# Patient Record
Sex: Female | Born: 1935 | Race: White | Hispanic: No | Smoking: Never smoker
Health system: Southern US, Community
[De-identification: ages and names within clinical notes are randomized; demographics above are authoritative.]

## PROBLEM LIST (undated history)

## (undated) DIAGNOSIS — K219 Gastro-esophageal reflux disease without esophagitis: Secondary | ICD-10-CM

## (undated) DIAGNOSIS — F419 Anxiety disorder, unspecified: Secondary | ICD-10-CM

## (undated) DIAGNOSIS — R39198 Other difficulties with micturition: Secondary | ICD-10-CM

## (undated) DIAGNOSIS — Z950 Presence of cardiac pacemaker: Secondary | ICD-10-CM

## (undated) DIAGNOSIS — Z8719 Personal history of other diseases of the digestive system: Secondary | ICD-10-CM

## (undated) DIAGNOSIS — I4891 Unspecified atrial fibrillation: Secondary | ICD-10-CM

## (undated) DIAGNOSIS — E0590 Thyrotoxicosis, unspecified without thyrotoxic crisis or storm: Secondary | ICD-10-CM

## (undated) DIAGNOSIS — C50919 Malignant neoplasm of unspecified site of unspecified female breast: Secondary | ICD-10-CM

## (undated) DIAGNOSIS — M1990 Unspecified osteoarthritis, unspecified site: Secondary | ICD-10-CM

## (undated) DIAGNOSIS — C7951 Secondary malignant neoplasm of bone: Principal | ICD-10-CM

## (undated) DIAGNOSIS — I495 Sick sinus syndrome: Secondary | ICD-10-CM

## (undated) DIAGNOSIS — I499 Cardiac arrhythmia, unspecified: Secondary | ICD-10-CM

## (undated) DIAGNOSIS — J449 Chronic obstructive pulmonary disease, unspecified: Secondary | ICD-10-CM

## (undated) DIAGNOSIS — J45909 Unspecified asthma, uncomplicated: Secondary | ICD-10-CM

## (undated) DIAGNOSIS — I10 Essential (primary) hypertension: Secondary | ICD-10-CM

## (undated) HISTORY — DX: Malignant neoplasm of unspecified site of unspecified female breast: C50.919

## (undated) HISTORY — DX: Secondary malignant neoplasm of bone: C79.51

---

## 1986-06-08 HISTORY — PX: ANKLE FRACTURE SURGERY: SHX122

## 1986-06-08 HISTORY — PX: TIBIA FRACTURE SURGERY: SHX806

## 1995-06-09 DIAGNOSIS — C50919 Malignant neoplasm of unspecified site of unspecified female breast: Secondary | ICD-10-CM

## 1995-06-09 HISTORY — DX: Malignant neoplasm of unspecified site of unspecified female breast: C50.919

## 1995-06-09 HISTORY — PX: MASTECTOMY: SHX3

## 1995-06-09 HISTORY — PX: BREAST BIOPSY: SHX20

## 1998-06-05 ENCOUNTER — Other Ambulatory Visit: Admission: RE | Admit: 1998-06-05 | Discharge: 1998-06-05 | Payer: Self-pay | Admitting: *Deleted

## 1998-10-18 ENCOUNTER — Encounter: Payer: Self-pay | Admitting: Emergency Medicine

## 1998-10-18 ENCOUNTER — Emergency Department (HOSPITAL_COMMUNITY): Admission: EM | Admit: 1998-10-18 | Discharge: 1998-10-18 | Payer: Self-pay | Admitting: Emergency Medicine

## 1998-12-09 ENCOUNTER — Other Ambulatory Visit: Admission: RE | Admit: 1998-12-09 | Discharge: 1998-12-09 | Payer: Self-pay | Admitting: Gastroenterology

## 1998-12-09 ENCOUNTER — Encounter (INDEPENDENT_AMBULATORY_CARE_PROVIDER_SITE_OTHER): Payer: Self-pay | Admitting: Specialist

## 1999-02-07 ENCOUNTER — Encounter: Payer: Self-pay | Admitting: Specialist

## 1999-02-07 ENCOUNTER — Ambulatory Visit (HOSPITAL_COMMUNITY): Admission: RE | Admit: 1999-02-07 | Discharge: 1999-02-07 | Payer: Self-pay | Admitting: Specialist

## 1999-06-05 ENCOUNTER — Encounter: Payer: Self-pay | Admitting: *Deleted

## 1999-06-05 ENCOUNTER — Encounter: Admission: RE | Admit: 1999-06-05 | Discharge: 1999-06-05 | Payer: Self-pay | Admitting: *Deleted

## 1999-06-05 ENCOUNTER — Other Ambulatory Visit: Admission: RE | Admit: 1999-06-05 | Discharge: 1999-06-05 | Payer: Self-pay | Admitting: *Deleted

## 1999-11-14 ENCOUNTER — Encounter: Admission: RE | Admit: 1999-11-14 | Discharge: 1999-11-14 | Payer: Self-pay | Admitting: Family Medicine

## 1999-11-14 ENCOUNTER — Encounter: Payer: Self-pay | Admitting: Family Medicine

## 2000-03-24 ENCOUNTER — Encounter: Payer: Self-pay | Admitting: Family Medicine

## 2000-03-24 ENCOUNTER — Encounter: Admission: RE | Admit: 2000-03-24 | Discharge: 2000-03-24 | Payer: Self-pay | Admitting: Family Medicine

## 2000-03-26 ENCOUNTER — Encounter: Admission: RE | Admit: 2000-03-26 | Discharge: 2000-03-26 | Payer: Self-pay | Admitting: Family Medicine

## 2000-03-26 ENCOUNTER — Encounter: Payer: Self-pay | Admitting: Family Medicine

## 2000-04-05 ENCOUNTER — Other Ambulatory Visit: Admission: RE | Admit: 2000-04-05 | Discharge: 2000-04-05 | Payer: Self-pay | Admitting: *Deleted

## 2000-04-22 ENCOUNTER — Encounter: Payer: Self-pay | Admitting: Gastroenterology

## 2000-04-22 ENCOUNTER — Ambulatory Visit (HOSPITAL_COMMUNITY): Admission: RE | Admit: 2000-04-22 | Discharge: 2000-04-22 | Payer: Self-pay | Admitting: Gastroenterology

## 2000-06-07 ENCOUNTER — Encounter: Admission: RE | Admit: 2000-06-07 | Discharge: 2000-06-07 | Payer: Self-pay | Admitting: Family Medicine

## 2000-06-07 ENCOUNTER — Encounter: Payer: Self-pay | Admitting: Family Medicine

## 2000-06-09 ENCOUNTER — Encounter: Payer: Self-pay | Admitting: Gastroenterology

## 2000-06-09 ENCOUNTER — Encounter: Admission: RE | Admit: 2000-06-09 | Discharge: 2000-06-09 | Payer: Self-pay | Admitting: Gastroenterology

## 2000-10-22 ENCOUNTER — Emergency Department (HOSPITAL_COMMUNITY): Admission: EM | Admit: 2000-10-22 | Discharge: 2000-10-22 | Payer: Self-pay | Admitting: Emergency Medicine

## 2001-04-06 ENCOUNTER — Encounter: Payer: Self-pay | Admitting: Family Medicine

## 2001-04-06 ENCOUNTER — Encounter: Admission: RE | Admit: 2001-04-06 | Discharge: 2001-04-06 | Payer: Self-pay | Admitting: Family Medicine

## 2001-05-18 ENCOUNTER — Emergency Department (HOSPITAL_COMMUNITY): Admission: EM | Admit: 2001-05-18 | Discharge: 2001-05-18 | Payer: Self-pay | Admitting: Emergency Medicine

## 2001-05-18 ENCOUNTER — Encounter: Payer: Self-pay | Admitting: Emergency Medicine

## 2001-06-23 ENCOUNTER — Encounter: Payer: Self-pay | Admitting: Family Medicine

## 2001-06-23 ENCOUNTER — Encounter: Admission: RE | Admit: 2001-06-23 | Discharge: 2001-06-23 | Payer: Self-pay | Admitting: Family Medicine

## 2002-02-20 ENCOUNTER — Ambulatory Visit (HOSPITAL_COMMUNITY): Admission: RE | Admit: 2002-02-20 | Discharge: 2002-02-20 | Payer: Self-pay | Admitting: Gastroenterology

## 2002-02-20 ENCOUNTER — Encounter (INDEPENDENT_AMBULATORY_CARE_PROVIDER_SITE_OTHER): Payer: Self-pay | Admitting: *Deleted

## 2002-07-26 ENCOUNTER — Encounter: Admission: RE | Admit: 2002-07-26 | Discharge: 2002-07-26 | Payer: Self-pay | Admitting: Family Medicine

## 2002-07-26 ENCOUNTER — Encounter: Payer: Self-pay | Admitting: Family Medicine

## 2002-10-09 ENCOUNTER — Encounter: Admission: RE | Admit: 2002-10-09 | Discharge: 2002-10-09 | Payer: Self-pay | Admitting: Family Medicine

## 2002-10-09 ENCOUNTER — Encounter: Payer: Self-pay | Admitting: Family Medicine

## 2003-06-26 ENCOUNTER — Encounter (INDEPENDENT_AMBULATORY_CARE_PROVIDER_SITE_OTHER): Payer: Self-pay | Admitting: Specialist

## 2003-06-26 ENCOUNTER — Ambulatory Visit (HOSPITAL_COMMUNITY): Admission: RE | Admit: 2003-06-26 | Discharge: 2003-06-26 | Payer: Self-pay | Admitting: Urology

## 2003-07-09 ENCOUNTER — Ambulatory Visit (HOSPITAL_COMMUNITY): Admission: RE | Admit: 2003-07-09 | Discharge: 2003-07-09 | Payer: Self-pay | Admitting: Hematology & Oncology

## 2003-07-11 ENCOUNTER — Ambulatory Visit (HOSPITAL_COMMUNITY): Admission: RE | Admit: 2003-07-11 | Discharge: 2003-07-11 | Payer: Self-pay | Admitting: Hematology & Oncology

## 2003-08-13 ENCOUNTER — Ambulatory Visit: Admission: RE | Admit: 2003-08-13 | Discharge: 2003-10-26 | Payer: Self-pay | Admitting: Radiation Oncology

## 2003-12-27 ENCOUNTER — Ambulatory Visit (HOSPITAL_COMMUNITY): Admission: RE | Admit: 2003-12-27 | Discharge: 2003-12-27 | Payer: Self-pay | Admitting: Hematology & Oncology

## 2004-02-07 ENCOUNTER — Emergency Department (HOSPITAL_COMMUNITY): Admission: EM | Admit: 2004-02-07 | Discharge: 2004-02-08 | Payer: Self-pay | Admitting: Emergency Medicine

## 2004-03-11 ENCOUNTER — Ambulatory Visit (HOSPITAL_COMMUNITY): Admission: RE | Admit: 2004-03-11 | Discharge: 2004-03-11 | Payer: Self-pay | Admitting: Hematology & Oncology

## 2004-04-25 ENCOUNTER — Ambulatory Visit: Payer: Self-pay | Admitting: Hematology & Oncology

## 2004-07-17 ENCOUNTER — Ambulatory Visit: Payer: Self-pay | Admitting: Hematology & Oncology

## 2004-07-24 ENCOUNTER — Ambulatory Visit (HOSPITAL_COMMUNITY): Admission: RE | Admit: 2004-07-24 | Discharge: 2004-07-24 | Payer: Self-pay | Admitting: Hematology & Oncology

## 2004-07-30 ENCOUNTER — Ambulatory Visit (HOSPITAL_COMMUNITY): Admission: RE | Admit: 2004-07-30 | Discharge: 2004-07-30 | Payer: Self-pay | Admitting: Hematology & Oncology

## 2004-09-11 ENCOUNTER — Ambulatory Visit: Payer: Self-pay | Admitting: Hematology & Oncology

## 2004-11-13 ENCOUNTER — Ambulatory Visit: Payer: Self-pay | Admitting: Hematology & Oncology

## 2004-12-16 ENCOUNTER — Ambulatory Visit (HOSPITAL_COMMUNITY): Admission: RE | Admit: 2004-12-16 | Discharge: 2004-12-16 | Payer: Self-pay | Admitting: Hematology & Oncology

## 2005-01-30 ENCOUNTER — Other Ambulatory Visit: Admission: RE | Admit: 2005-01-30 | Discharge: 2005-01-30 | Payer: Self-pay | Admitting: Family Medicine

## 2005-02-12 ENCOUNTER — Encounter: Admission: RE | Admit: 2005-02-12 | Discharge: 2005-02-12 | Payer: Self-pay | Admitting: Family Medicine

## 2005-02-26 ENCOUNTER — Ambulatory Visit: Payer: Self-pay | Admitting: Hematology & Oncology

## 2005-03-31 ENCOUNTER — Ambulatory Visit (HOSPITAL_COMMUNITY): Admission: RE | Admit: 2005-03-31 | Discharge: 2005-03-31 | Payer: Self-pay | Admitting: Hematology & Oncology

## 2005-04-23 ENCOUNTER — Ambulatory Visit: Payer: Self-pay | Admitting: Hematology & Oncology

## 2005-05-16 ENCOUNTER — Emergency Department (HOSPITAL_COMMUNITY): Admission: EM | Admit: 2005-05-16 | Discharge: 2005-05-16 | Payer: Self-pay | Admitting: Emergency Medicine

## 2005-06-25 ENCOUNTER — Ambulatory Visit: Payer: Self-pay | Admitting: Hematology & Oncology

## 2005-07-16 ENCOUNTER — Ambulatory Visit (HOSPITAL_COMMUNITY): Admission: RE | Admit: 2005-07-16 | Discharge: 2005-07-16 | Payer: Self-pay | Admitting: Hematology & Oncology

## 2005-08-28 ENCOUNTER — Ambulatory Visit: Payer: Self-pay | Admitting: Hematology & Oncology

## 2005-10-16 ENCOUNTER — Ambulatory Visit: Payer: Self-pay | Admitting: Hematology & Oncology

## 2005-10-23 LAB — BASIC METABOLIC PANEL
BUN: 16 mg/dL (ref 6–23)
CO2: 24 mEq/L (ref 19–32)
Calcium: 9 mg/dL (ref 8.4–10.5)
Chloride: 106 mEq/L (ref 96–112)
Creatinine, Ser: 0.9 mg/dL (ref 0.4–1.2)
Glucose, Bld: 88 mg/dL (ref 70–99)
Potassium: 3.8 mEq/L (ref 3.5–5.3)
Sodium: 140 mEq/L (ref 135–145)

## 2005-11-23 ENCOUNTER — Ambulatory Visit (HOSPITAL_COMMUNITY): Admission: RE | Admit: 2005-11-23 | Discharge: 2005-11-23 | Payer: Self-pay | Admitting: Hematology & Oncology

## 2005-12-22 ENCOUNTER — Ambulatory Visit: Payer: Self-pay | Admitting: Hematology & Oncology

## 2005-12-25 LAB — COMPREHENSIVE METABOLIC PANEL
ALT: 14 U/L (ref 0–40)
AST: 26 U/L (ref 0–37)
Albumin: 3.8 g/dL (ref 3.5–5.2)
Alkaline Phosphatase: 47 U/L (ref 39–117)
BUN: 14 mg/dL (ref 6–23)
CO2: 24 mEq/L (ref 19–32)
Calcium: 8.9 mg/dL (ref 8.4–10.5)
Chloride: 111 mEq/L (ref 96–112)
Creatinine, Ser: 1.02 mg/dL (ref 0.40–1.20)
Glucose, Bld: 112 mg/dL — ABNORMAL HIGH (ref 70–99)
Potassium: 4 mEq/L (ref 3.5–5.3)
Sodium: 137 mEq/L (ref 135–145)
Total Bilirubin: 0.9 mg/dL (ref 0.3–1.2)
Total Protein: 6.5 g/dL (ref 6.0–8.3)

## 2005-12-25 LAB — CBC WITH DIFFERENTIAL/PLATELET
BASO%: 1 % (ref 0.0–2.0)
Basophils Absolute: 0 10*3/uL (ref 0.0–0.1)
EOS%: 3.3 % (ref 0.0–7.0)
Eosinophils Absolute: 0.1 10*3/uL (ref 0.0–0.5)
HCT: 34.4 % — ABNORMAL LOW (ref 34.8–46.6)
HGB: 11.5 g/dL — ABNORMAL LOW (ref 11.6–15.9)
LYMPH%: 33.1 % (ref 14.0–48.0)
MCH: 27.9 pg (ref 26.0–34.0)
MCHC: 33.5 g/dL (ref 32.0–36.0)
MCV: 83.1 fL (ref 81.0–101.0)
MONO#: 0.3 10*3/uL (ref 0.1–0.9)
MONO%: 8.1 % (ref 0.0–13.0)
NEUT#: 1.9 10*3/uL (ref 1.5–6.5)
NEUT%: 54.5 % (ref 39.6–76.8)
Platelets: 156 10*3/uL (ref 145–400)
RBC: 4.14 10*6/uL (ref 3.70–5.32)
RDW: 12.8 % (ref 11.3–14.5)
WBC: 3.5 10*3/uL — ABNORMAL LOW (ref 3.9–10.0)
lymph#: 1.2 10*3/uL (ref 0.9–3.3)

## 2005-12-25 LAB — CANCER ANTIGEN 27.29: CA 27.29: 34 U/mL (ref 0–39)

## 2006-02-24 ENCOUNTER — Ambulatory Visit: Payer: Self-pay | Admitting: Hematology & Oncology

## 2006-02-26 LAB — CBC WITH DIFFERENTIAL/PLATELET
BASO%: 1.3 % (ref 0.0–2.0)
Basophils Absolute: 0.1 10*3/uL (ref 0.0–0.1)
EOS%: 3.3 % (ref 0.0–7.0)
Eosinophils Absolute: 0.1 10*3/uL (ref 0.0–0.5)
HCT: 35.4 % (ref 34.8–46.6)
HGB: 12.3 g/dL (ref 11.6–15.9)
LYMPH%: 33.3 % (ref 14.0–48.0)
MCH: 28.2 pg (ref 26.0–34.0)
MCHC: 34.9 g/dL (ref 32.0–36.0)
MCV: 81 fL (ref 81.0–101.0)
MONO#: 0.3 10*3/uL (ref 0.1–0.9)
MONO%: 8.2 % (ref 0.0–13.0)
NEUT#: 2.2 10*3/uL (ref 1.5–6.5)
NEUT%: 54 % (ref 39.6–76.8)
Platelets: 189 10*3/uL (ref 145–400)
RBC: 4.37 10*6/uL (ref 3.70–5.32)
RDW: 12.4 % (ref 11.3–14.5)
WBC: 4.1 10*3/uL (ref 3.9–10.0)
lymph#: 1.4 10*3/uL (ref 0.9–3.3)

## 2006-02-26 LAB — COMPREHENSIVE METABOLIC PANEL
ALT: 9 U/L (ref 0–40)
AST: 20 U/L (ref 0–37)
Albumin: 4 g/dL (ref 3.5–5.2)
Alkaline Phosphatase: 44 U/L (ref 39–117)
BUN: 16 mg/dL (ref 6–23)
CO2: 23 mEq/L (ref 19–32)
Calcium: 8.7 mg/dL (ref 8.4–10.5)
Chloride: 108 mEq/L (ref 96–112)
Creatinine, Ser: 0.86 mg/dL (ref 0.40–1.20)
Glucose, Bld: 89 mg/dL (ref 70–99)
Potassium: 4 mEq/L (ref 3.5–5.3)
Sodium: 141 mEq/L (ref 135–145)
Total Bilirubin: 0.9 mg/dL (ref 0.3–1.2)
Total Protein: 6.5 g/dL (ref 6.0–8.3)

## 2006-02-26 LAB — CANCER ANTIGEN 27.29: CA 27.29: 30 U/mL (ref 0–39)

## 2006-03-04 ENCOUNTER — Ambulatory Visit (HOSPITAL_COMMUNITY): Admission: RE | Admit: 2006-03-04 | Discharge: 2006-03-04 | Payer: Self-pay | Admitting: Hematology & Oncology

## 2006-03-20 ENCOUNTER — Emergency Department (HOSPITAL_COMMUNITY): Admission: EM | Admit: 2006-03-20 | Discharge: 2006-03-20 | Payer: Self-pay | Admitting: Family Medicine

## 2006-04-25 ENCOUNTER — Ambulatory Visit: Payer: Self-pay | Admitting: Hematology & Oncology

## 2006-04-28 LAB — COMPREHENSIVE METABOLIC PANEL
ALT: 12 U/L (ref 0–35)
AST: 20 U/L (ref 0–37)
Albumin: 4.3 g/dL (ref 3.5–5.2)
Alkaline Phosphatase: 51 U/L (ref 39–117)
BUN: 18 mg/dL (ref 6–23)
CO2: 24 mEq/L (ref 19–32)
Calcium: 9.2 mg/dL (ref 8.4–10.5)
Chloride: 108 mEq/L (ref 96–112)
Creatinine, Ser: 0.97 mg/dL (ref 0.40–1.20)
Glucose, Bld: 86 mg/dL (ref 70–99)
Potassium: 4.4 mEq/L (ref 3.5–5.3)
Sodium: 134 mEq/L — ABNORMAL LOW (ref 135–145)
Total Bilirubin: 0.9 mg/dL (ref 0.3–1.2)
Total Protein: 7 g/dL (ref 6.0–8.3)

## 2006-04-28 LAB — CBC WITH DIFFERENTIAL/PLATELET
BASO%: 1.3 % (ref 0.0–2.0)
Basophils Absolute: 0 10*3/uL (ref 0.0–0.1)
EOS%: 3.9 % (ref 0.0–7.0)
Eosinophils Absolute: 0.1 10*3/uL (ref 0.0–0.5)
HCT: 34.8 % (ref 34.8–46.6)
HGB: 11.9 g/dL (ref 11.6–15.9)
LYMPH%: 41.1 % (ref 14.0–48.0)
MCH: 28.2 pg (ref 26.0–34.0)
MCHC: 34.1 g/dL (ref 32.0–36.0)
MCV: 82.6 fL (ref 81.0–101.0)
MONO#: 0.3 10*3/uL (ref 0.1–0.9)
MONO%: 7.9 % (ref 0.0–13.0)
NEUT#: 1.6 10*3/uL (ref 1.5–6.5)
NEUT%: 45.7 % (ref 39.6–76.8)
Platelets: 180 10*3/uL (ref 145–400)
RBC: 4.22 10*6/uL (ref 3.70–5.32)
RDW: 12.6 % (ref 11.3–14.5)
WBC: 3.6 10*3/uL — ABNORMAL LOW (ref 3.9–10.0)
lymph#: 1.5 10*3/uL (ref 0.9–3.3)

## 2006-04-28 LAB — CANCER ANTIGEN 27.29: CA 27.29: 33 U/mL (ref 0–39)

## 2006-05-02 ENCOUNTER — Emergency Department (HOSPITAL_COMMUNITY): Admission: EM | Admit: 2006-05-02 | Discharge: 2006-05-02 | Payer: Self-pay | Admitting: Emergency Medicine

## 2006-05-05 HISTORY — PX: NM MYOCAR PERF WALL MOTION: HXRAD629

## 2006-05-19 ENCOUNTER — Ambulatory Visit (HOSPITAL_COMMUNITY): Admission: RE | Admit: 2006-05-19 | Discharge: 2006-05-19 | Payer: Self-pay | Admitting: Hematology & Oncology

## 2006-06-18 ENCOUNTER — Ambulatory Visit: Payer: Self-pay | Admitting: Hematology & Oncology

## 2006-06-23 LAB — COMPREHENSIVE METABOLIC PANEL
ALT: 9 U/L (ref 0–35)
AST: 16 U/L (ref 0–37)
Albumin: 4 g/dL (ref 3.5–5.2)
Alkaline Phosphatase: 61 U/L (ref 39–117)
BUN: 16 mg/dL (ref 6–23)
CO2: 22 mEq/L (ref 19–32)
Calcium: 9.1 mg/dL (ref 8.4–10.5)
Chloride: 109 mEq/L (ref 96–112)
Creatinine, Ser: 0.95 mg/dL (ref 0.40–1.20)
Glucose, Bld: 101 mg/dL — ABNORMAL HIGH (ref 70–99)
Potassium: 4.1 mEq/L (ref 3.5–5.3)
Sodium: 142 mEq/L (ref 135–145)
Total Bilirubin: 0.9 mg/dL (ref 0.3–1.2)
Total Protein: 6.7 g/dL (ref 6.0–8.3)

## 2006-06-23 LAB — CBC WITH DIFFERENTIAL/PLATELET
BASO%: 0.3 % (ref 0.0–2.0)
Basophils Absolute: 0 10*3/uL (ref 0.0–0.1)
EOS%: 3.1 % (ref 0.0–7.0)
Eosinophils Absolute: 0.2 10*3/uL (ref 0.0–0.5)
HCT: 36.3 % (ref 34.8–46.6)
HGB: 12.4 g/dL (ref 11.6–15.9)
LYMPH%: 22.2 % (ref 14.0–48.0)
MCH: 28.4 pg (ref 26.0–34.0)
MCHC: 34.2 g/dL (ref 32.0–36.0)
MCV: 83.1 fL (ref 81.0–101.0)
MONO#: 0.3 10*3/uL (ref 0.1–0.9)
MONO%: 6.8 % (ref 0.0–13.0)
NEUT#: 3.4 10*3/uL (ref 1.5–6.5)
NEUT%: 67.6 % (ref 39.6–76.8)
Platelets: 208 10*3/uL (ref 145–400)
RBC: 4.37 10*6/uL (ref 3.70–5.32)
RDW: 14.5 % (ref 11.3–14.5)
WBC: 5 10*3/uL (ref 3.9–10.0)
lymph#: 1.1 10*3/uL (ref 0.9–3.3)

## 2006-06-23 LAB — CANCER ANTIGEN 27.29: CA 27.29: 35 U/mL (ref 0–39)

## 2006-08-30 ENCOUNTER — Ambulatory Visit: Payer: Self-pay | Admitting: Hematology & Oncology

## 2006-09-01 LAB — CBC WITH DIFFERENTIAL/PLATELET
BASO%: 1.4 % (ref 0.0–2.0)
Basophils Absolute: 0.1 10*3/uL (ref 0.0–0.1)
EOS%: 4.4 % (ref 0.0–7.0)
Eosinophils Absolute: 0.2 10*3/uL (ref 0.0–0.5)
HCT: 35.3 % (ref 34.8–46.6)
HGB: 12 g/dL (ref 11.6–15.9)
LYMPH%: 32.5 % (ref 14.0–48.0)
MCH: 28.1 pg (ref 26.0–34.0)
MCHC: 34 g/dL (ref 32.0–36.0)
MCV: 82.8 fL (ref 81.0–101.0)
MONO#: 0.3 10*3/uL (ref 0.1–0.9)
MONO%: 9.6 % (ref 0.0–13.0)
NEUT#: 1.9 10*3/uL (ref 1.5–6.5)
NEUT%: 52.1 % (ref 39.6–76.8)
Platelets: 171 10*3/uL (ref 145–400)
RBC: 4.26 10*6/uL (ref 3.70–5.32)
RDW: 12.3 % (ref 11.3–14.5)
WBC: 3.6 10*3/uL — ABNORMAL LOW (ref 3.9–10.0)
lymph#: 1.2 10*3/uL (ref 0.9–3.3)

## 2006-09-01 LAB — COMPREHENSIVE METABOLIC PANEL
ALT: 10 U/L (ref 0–35)
AST: 18 U/L (ref 0–37)
Albumin: 4.1 g/dL (ref 3.5–5.2)
Alkaline Phosphatase: 55 U/L (ref 39–117)
BUN: 13 mg/dL (ref 6–23)
CO2: 23 mEq/L (ref 19–32)
Calcium: 9.1 mg/dL (ref 8.4–10.5)
Chloride: 108 mEq/L (ref 96–112)
Creatinine, Ser: 0.92 mg/dL (ref 0.40–1.20)
Glucose, Bld: 82 mg/dL (ref 70–99)
Potassium: 4.2 mEq/L (ref 3.5–5.3)
Sodium: 142 mEq/L (ref 135–145)
Total Bilirubin: 1.2 mg/dL (ref 0.3–1.2)
Total Protein: 7 g/dL (ref 6.0–8.3)

## 2006-09-01 LAB — CANCER ANTIGEN 27.29: CA 27.29: 32 U/mL (ref 0–39)

## 2006-10-08 ENCOUNTER — Encounter: Admission: RE | Admit: 2006-10-08 | Discharge: 2006-10-08 | Payer: Self-pay | Admitting: Family Medicine

## 2006-10-29 ENCOUNTER — Ambulatory Visit: Payer: Self-pay | Admitting: Hematology & Oncology

## 2006-11-03 LAB — CBC WITH DIFFERENTIAL/PLATELET
BASO%: 0.7 % (ref 0.0–2.0)
Basophils Absolute: 0 10*3/uL (ref 0.0–0.1)
EOS%: 1.7 % (ref 0.0–7.0)
Eosinophils Absolute: 0.1 10*3/uL (ref 0.0–0.5)
HCT: 36 % (ref 34.8–46.6)
HGB: 12.7 g/dL (ref 11.6–15.9)
LYMPH%: 16.3 % (ref 14.0–48.0)
MCH: 28.7 pg (ref 26.0–34.0)
MCHC: 35.2 g/dL (ref 32.0–36.0)
MCV: 81.4 fL (ref 81.0–101.0)
MONO#: 0.5 10*3/uL (ref 0.1–0.9)
MONO%: 7.5 % (ref 0.0–13.0)
NEUT#: 5.1 10*3/uL (ref 1.5–6.5)
NEUT%: 73.8 % (ref 39.6–76.8)
Platelets: 198 10*3/uL (ref 145–400)
RBC: 4.42 10*6/uL (ref 3.70–5.32)
RDW: 11.8 % (ref 11.3–14.5)
WBC: 7 10*3/uL (ref 3.9–10.0)
lymph#: 1.1 10*3/uL (ref 0.9–3.3)

## 2006-11-03 LAB — COMPREHENSIVE METABOLIC PANEL
ALT: 12 U/L (ref 0–35)
AST: 19 U/L (ref 0–37)
Albumin: 4.3 g/dL (ref 3.5–5.2)
Alkaline Phosphatase: 65 U/L (ref 39–117)
BUN: 15 mg/dL (ref 6–23)
CO2: 23 mEq/L (ref 19–32)
Calcium: 8.7 mg/dL (ref 8.4–10.5)
Chloride: 107 mEq/L (ref 96–112)
Creatinine, Ser: 0.89 mg/dL (ref 0.40–1.20)
Glucose, Bld: 92 mg/dL (ref 70–99)
Potassium: 4.1 mEq/L (ref 3.5–5.3)
Sodium: 141 mEq/L (ref 135–145)
Total Bilirubin: 1.3 mg/dL — ABNORMAL HIGH (ref 0.3–1.2)
Total Protein: 7.3 g/dL (ref 6.0–8.3)

## 2006-11-03 LAB — CANCER ANTIGEN 27.29: CA 27.29: 35 U/mL (ref 0–39)

## 2006-12-17 ENCOUNTER — Ambulatory Visit: Payer: Self-pay | Admitting: Hematology & Oncology

## 2006-12-21 LAB — COMPREHENSIVE METABOLIC PANEL
ALT: 14 U/L (ref 0–35)
AST: 23 U/L (ref 0–37)
Albumin: 3.9 g/dL (ref 3.5–5.2)
Alkaline Phosphatase: 56 U/L (ref 39–117)
BUN: 16 mg/dL (ref 6–23)
CO2: 26 mEq/L (ref 19–32)
Calcium: 9.1 mg/dL (ref 8.4–10.5)
Chloride: 110 mEq/L (ref 96–112)
Creatinine, Ser: 0.88 mg/dL (ref 0.40–1.20)
Glucose, Bld: 83 mg/dL (ref 70–99)
Potassium: 4.6 mEq/L (ref 3.5–5.3)
Sodium: 142 mEq/L (ref 135–145)
Total Bilirubin: 0.9 mg/dL (ref 0.3–1.2)
Total Protein: 6.3 g/dL (ref 6.0–8.3)

## 2006-12-21 LAB — CBC WITH DIFFERENTIAL/PLATELET
BASO%: 0.5 % (ref 0.0–2.0)
Basophils Absolute: 0 10*3/uL (ref 0.0–0.1)
EOS%: 4 % (ref 0.0–7.0)
Eosinophils Absolute: 0.2 10*3/uL (ref 0.0–0.5)
HCT: 31.9 % — ABNORMAL LOW (ref 34.8–46.6)
HGB: 11.3 g/dL — ABNORMAL LOW (ref 11.6–15.9)
LYMPH%: 39.3 % (ref 14.0–48.0)
MCH: 28.9 pg (ref 26.0–34.0)
MCHC: 35.3 g/dL (ref 32.0–36.0)
MCV: 81.7 fL (ref 81.0–101.0)
MONO#: 0.4 10*3/uL (ref 0.1–0.9)
MONO%: 9.7 % (ref 0.0–13.0)
NEUT#: 1.9 10*3/uL (ref 1.5–6.5)
NEUT%: 46.5 % (ref 39.6–76.8)
Platelets: 179 10*3/uL (ref 145–400)
RBC: 3.9 10*6/uL (ref 3.70–5.32)
RDW: 14.6 % — ABNORMAL HIGH (ref 11.3–14.5)
WBC: 4 10*3/uL (ref 3.9–10.0)
lymph#: 1.6 10*3/uL (ref 0.9–3.3)

## 2006-12-21 LAB — CANCER ANTIGEN 27.29: CA 27.29: 28 U/mL (ref 0–39)

## 2006-12-28 ENCOUNTER — Ambulatory Visit (HOSPITAL_COMMUNITY): Admission: RE | Admit: 2006-12-28 | Discharge: 2006-12-28 | Payer: Self-pay | Admitting: Hematology & Oncology

## 2006-12-29 LAB — BASIC METABOLIC PANEL
BUN: 13 mg/dL (ref 6–23)
CO2: 24 mEq/L (ref 19–32)
Calcium: 8.6 mg/dL (ref 8.4–10.5)
Chloride: 111 mEq/L (ref 96–112)
Creatinine, Ser: 0.93 mg/dL (ref 0.40–1.20)
Glucose, Bld: 83 mg/dL (ref 70–99)
Potassium: 3.9 mEq/L (ref 3.5–5.3)
Sodium: 141 mEq/L (ref 135–145)

## 2007-02-28 ENCOUNTER — Ambulatory Visit: Payer: Self-pay | Admitting: Hematology & Oncology

## 2007-03-02 LAB — CBC WITH DIFFERENTIAL/PLATELET
BASO%: 0.5 % (ref 0.0–2.0)
Basophils Absolute: 0 10*3/uL (ref 0.0–0.1)
EOS%: 4.9 % (ref 0.0–7.0)
Eosinophils Absolute: 0.2 10*3/uL (ref 0.0–0.5)
HCT: 32.8 % — ABNORMAL LOW (ref 34.8–46.6)
HGB: 11.7 g/dL (ref 11.6–15.9)
LYMPH%: 35.4 % (ref 14.0–48.0)
MCH: 28.9 pg (ref 26.0–34.0)
MCHC: 35.6 g/dL (ref 32.0–36.0)
MCV: 81.1 fL (ref 81.0–101.0)
MONO#: 0.3 10*3/uL (ref 0.1–0.9)
MONO%: 8.9 % (ref 0.0–13.0)
NEUT#: 1.6 10*3/uL (ref 1.5–6.5)
NEUT%: 50.3 % (ref 39.6–76.8)
Platelets: 170 10*3/uL (ref 145–400)
RBC: 4.05 10*6/uL (ref 3.70–5.32)
RDW: 14.9 % — ABNORMAL HIGH (ref 11.3–14.5)
WBC: 3.1 10*3/uL — ABNORMAL LOW (ref 3.9–10.0)
lymph#: 1.1 10*3/uL (ref 0.9–3.3)

## 2007-03-02 LAB — COMPREHENSIVE METABOLIC PANEL
ALT: 13 U/L (ref 0–35)
AST: 23 U/L (ref 0–37)
Albumin: 4.4 g/dL (ref 3.5–5.2)
Alkaline Phosphatase: 56 U/L (ref 39–117)
BUN: 14 mg/dL (ref 6–23)
CO2: 23 mEq/L (ref 19–32)
Calcium: 9.1 mg/dL (ref 8.4–10.5)
Chloride: 107 mEq/L (ref 96–112)
Creatinine, Ser: 0.95 mg/dL (ref 0.40–1.20)
Glucose, Bld: 83 mg/dL (ref 70–99)
Potassium: 4.1 mEq/L (ref 3.5–5.3)
Sodium: 140 mEq/L (ref 135–145)
Total Bilirubin: 1.1 mg/dL (ref 0.3–1.2)
Total Protein: 7.3 g/dL (ref 6.0–8.3)

## 2007-03-02 LAB — CANCER ANTIGEN 27.29: CA 27.29: 35 U/mL (ref 0–39)

## 2007-03-17 ENCOUNTER — Ambulatory Visit (HOSPITAL_COMMUNITY): Admission: RE | Admit: 2007-03-17 | Discharge: 2007-03-17 | Payer: Self-pay | Admitting: Hematology & Oncology

## 2007-05-24 ENCOUNTER — Ambulatory Visit: Payer: Self-pay | Admitting: Hematology & Oncology

## 2007-05-26 LAB — COMPREHENSIVE METABOLIC PANEL
ALT: 8 U/L (ref 0–35)
AST: 20 U/L (ref 0–37)
Albumin: 4.1 g/dL (ref 3.5–5.2)
Alkaline Phosphatase: 58 U/L (ref 39–117)
BUN: 19 mg/dL (ref 6–23)
CO2: 23 mEq/L (ref 19–32)
Calcium: 8.9 mg/dL (ref 8.4–10.5)
Chloride: 109 mEq/L (ref 96–112)
Creatinine, Ser: 0.89 mg/dL (ref 0.40–1.20)
Glucose, Bld: 92 mg/dL (ref 70–99)
Potassium: 4 mEq/L (ref 3.5–5.3)
Sodium: 142 mEq/L (ref 135–145)
Total Bilirubin: 1.3 mg/dL — ABNORMAL HIGH (ref 0.3–1.2)
Total Protein: 6.9 g/dL (ref 6.0–8.3)

## 2007-05-26 LAB — CBC WITH DIFFERENTIAL/PLATELET
BASO%: 0.8 % (ref 0.0–2.0)
Basophils Absolute: 0 10*3/uL (ref 0.0–0.1)
EOS%: 3.4 % (ref 0.0–7.0)
Eosinophils Absolute: 0.1 10*3/uL (ref 0.0–0.5)
HCT: 36 % (ref 34.8–46.6)
HGB: 12.3 g/dL (ref 11.6–15.9)
LYMPH%: 32.8 % (ref 14.0–48.0)
MCH: 28.2 pg (ref 26.0–34.0)
MCHC: 34.2 g/dL (ref 32.0–36.0)
MCV: 82.3 fL (ref 81.0–101.0)
MONO#: 0.3 10*3/uL (ref 0.1–0.9)
MONO%: 8.8 % (ref 0.0–13.0)
NEUT#: 1.9 10*3/uL (ref 1.5–6.5)
NEUT%: 54.2 % (ref 39.6–76.8)
Platelets: 202 10*3/uL (ref 145–400)
RBC: 4.38 10*6/uL (ref 3.70–5.32)
RDW: 11.5 % (ref 11.3–14.5)
WBC: 3.4 10*3/uL — ABNORMAL LOW (ref 3.9–10.0)
lymph#: 1.1 10*3/uL (ref 0.9–3.3)

## 2007-05-26 LAB — CANCER ANTIGEN 27.29: CA 27.29: 31 U/mL (ref 0–39)

## 2007-08-10 ENCOUNTER — Ambulatory Visit (HOSPITAL_COMMUNITY): Admission: RE | Admit: 2007-08-10 | Discharge: 2007-08-10 | Payer: Self-pay | Admitting: Hematology & Oncology

## 2007-08-23 ENCOUNTER — Ambulatory Visit: Payer: Self-pay | Admitting: Hematology & Oncology

## 2007-08-25 LAB — CBC WITH DIFFERENTIAL/PLATELET
BASO%: 1 % (ref 0.0–2.0)
Basophils Absolute: 0 10*3/uL (ref 0.0–0.1)
EOS%: 3.7 % (ref 0.0–7.0)
Eosinophils Absolute: 0.2 10*3/uL (ref 0.0–0.5)
HCT: 36.2 % (ref 34.8–46.6)
HGB: 12.2 g/dL (ref 11.6–15.9)
LYMPH%: 26.5 % (ref 14.0–48.0)
MCH: 27.9 pg (ref 26.0–34.0)
MCHC: 33.7 g/dL (ref 32.0–36.0)
MCV: 82.7 fL (ref 81.0–101.0)
MONO#: 0.4 10*3/uL (ref 0.1–0.9)
MONO%: 8.3 % (ref 0.0–13.0)
NEUT#: 2.6 10*3/uL (ref 1.5–6.5)
NEUT%: 60.5 % (ref 39.6–76.8)
Platelets: 228 10*3/uL (ref 145–400)
RBC: 4.38 10*6/uL (ref 3.70–5.32)
RDW: 13.6 % (ref 11.3–14.5)
WBC: 4.3 10*3/uL (ref 3.9–10.0)
lymph#: 1.1 10*3/uL (ref 0.9–3.3)

## 2007-08-25 LAB — COMPREHENSIVE METABOLIC PANEL
ALT: 11 U/L (ref 0–35)
AST: 20 U/L (ref 0–37)
Albumin: 4.2 g/dL (ref 3.5–5.2)
Alkaline Phosphatase: 72 U/L (ref 39–117)
BUN: 22 mg/dL (ref 6–23)
CO2: 21 mEq/L (ref 19–32)
Calcium: 8.9 mg/dL (ref 8.4–10.5)
Chloride: 110 mEq/L (ref 96–112)
Creatinine, Ser: 0.93 mg/dL (ref 0.40–1.20)
Glucose, Bld: 81 mg/dL (ref 70–99)
Potassium: 4.1 mEq/L (ref 3.5–5.3)
Sodium: 143 mEq/L (ref 135–145)
Total Bilirubin: 0.7 mg/dL (ref 0.3–1.2)
Total Protein: 7.2 g/dL (ref 6.0–8.3)

## 2007-08-25 LAB — CANCER ANTIGEN 27.29: CA 27.29: 34 U/mL (ref 0–39)

## 2007-09-18 ENCOUNTER — Emergency Department (HOSPITAL_COMMUNITY): Admission: EM | Admit: 2007-09-18 | Discharge: 2007-09-18 | Payer: Self-pay | Admitting: Emergency Medicine

## 2007-11-22 ENCOUNTER — Ambulatory Visit: Payer: Self-pay | Admitting: Hematology & Oncology

## 2007-11-24 LAB — CBC WITH DIFFERENTIAL/PLATELET
BASO%: 0.5 % (ref 0.0–2.0)
Basophils Absolute: 0 10*3/uL (ref 0.0–0.1)
EOS%: 4.2 % (ref 0.0–7.0)
Eosinophils Absolute: 0.2 10*3/uL (ref 0.0–0.5)
HCT: 38.1 % (ref 34.8–46.6)
HGB: 13.2 g/dL (ref 11.6–15.9)
LYMPH%: 33 % (ref 14.0–48.0)
MCH: 28.5 pg (ref 26.0–34.0)
MCHC: 34.7 g/dL (ref 32.0–36.0)
MCV: 82.2 fL (ref 81.0–101.0)
MONO#: 0.3 10*3/uL (ref 0.1–0.9)
MONO%: 8.6 % (ref 0.0–13.0)
NEUT#: 2.1 10*3/uL (ref 1.5–6.5)
NEUT%: 53.7 % (ref 39.6–76.8)
Platelets: 227 10*3/uL (ref 145–400)
RBC: 4.64 10*6/uL (ref 3.70–5.32)
RDW: 15.6 % — ABNORMAL HIGH (ref 11.3–14.5)
WBC: 3.9 10*3/uL (ref 3.9–10.0)
lymph#: 1.3 10*3/uL (ref 0.9–3.3)

## 2007-11-24 LAB — COMPREHENSIVE METABOLIC PANEL
ALT: 14 U/L (ref 0–35)
AST: 22 U/L (ref 0–37)
Albumin: 3.9 g/dL (ref 3.5–5.2)
Alkaline Phosphatase: 48 U/L (ref 39–117)
BUN: 15 mg/dL (ref 6–23)
CO2: 27 mEq/L (ref 19–32)
Calcium: 9.1 mg/dL (ref 8.4–10.5)
Chloride: 107 mEq/L (ref 96–112)
Creatinine, Ser: 1.02 mg/dL (ref 0.40–1.20)
Glucose, Bld: 86 mg/dL (ref 70–99)
Potassium: 4.2 mEq/L (ref 3.5–5.3)
Sodium: 140 mEq/L (ref 135–145)
Total Bilirubin: 1.3 mg/dL — ABNORMAL HIGH (ref 0.3–1.2)
Total Protein: 6.9 g/dL (ref 6.0–8.3)

## 2007-11-24 LAB — CANCER ANTIGEN 27.29: CA 27.29: 40 U/mL — ABNORMAL HIGH (ref 0–39)

## 2008-02-17 ENCOUNTER — Ambulatory Visit (HOSPITAL_COMMUNITY): Admission: RE | Admit: 2008-02-17 | Discharge: 2008-02-17 | Payer: Self-pay | Admitting: Hematology & Oncology

## 2008-02-22 ENCOUNTER — Ambulatory Visit: Payer: Self-pay | Admitting: Hematology & Oncology

## 2008-02-23 LAB — CMP (CANCER CENTER ONLY)
ALT(SGPT): 13 U/L (ref 10–47)
AST: 27 U/L (ref 11–38)
Albumin: 3.9 g/dL (ref 3.3–5.5)
Alkaline Phosphatase: 65 U/L (ref 26–84)
BUN, Bld: 16 mg/dL (ref 7–22)
CO2: 26 mEq/L (ref 18–33)
Calcium: 9 mg/dL (ref 8.0–10.3)
Chloride: 104 mEq/L (ref 98–108)
Creat: 0.8 mg/dl (ref 0.6–1.2)
Glucose, Bld: 89 mg/dL (ref 73–118)
Potassium: 4.4 mEq/L (ref 3.3–4.7)
Sodium: 139 mEq/L (ref 128–145)
Total Bilirubin: 1.2 mg/dl (ref 0.20–1.60)
Total Protein: 7.4 g/dL (ref 6.4–8.1)

## 2008-02-23 LAB — CBC WITH DIFFERENTIAL (CANCER CENTER ONLY)
BASO#: 0 10*3/uL (ref 0.0–0.2)
BASO%: 0.7 % (ref 0.0–2.0)
EOS%: 5.4 % (ref 0.0–7.0)
Eosinophils Absolute: 0.2 10*3/uL (ref 0.0–0.5)
HCT: 36 % (ref 34.8–46.6)
HGB: 12.3 g/dL (ref 11.6–15.9)
LYMPH#: 1.1 10*3/uL (ref 0.9–3.3)
LYMPH%: 36 % (ref 14.0–48.0)
MCH: 27.7 pg (ref 26.0–34.0)
MCHC: 34 g/dL (ref 32.0–36.0)
MCV: 81 fL (ref 81–101)
MONO#: 0.3 10*3/uL (ref 0.1–0.9)
MONO%: 9.7 % (ref 0.0–13.0)
NEUT#: 1.5 10*3/uL (ref 1.5–6.5)
NEUT%: 48.2 % (ref 39.6–80.0)
Platelets: 204 10*3/uL (ref 145–400)
RBC: 4.42 10*6/uL (ref 3.70–5.32)
RDW: 14.3 % (ref 10.5–14.6)
WBC: 3 10*3/uL — ABNORMAL LOW (ref 3.9–10.0)

## 2008-02-23 LAB — CANCER ANTIGEN 27.29: CA 27.29: 39 U/mL (ref 0–39)

## 2008-04-15 ENCOUNTER — Observation Stay (HOSPITAL_COMMUNITY): Admission: EM | Admit: 2008-04-15 | Discharge: 2008-04-16 | Payer: Self-pay | Admitting: Emergency Medicine

## 2008-05-01 ENCOUNTER — Ambulatory Visit: Payer: Self-pay | Admitting: Hematology & Oncology

## 2008-05-10 ENCOUNTER — Encounter: Admission: RE | Admit: 2008-05-10 | Discharge: 2008-05-10 | Payer: Self-pay | Admitting: Family Medicine

## 2008-05-29 ENCOUNTER — Ambulatory Visit (HOSPITAL_COMMUNITY): Admission: RE | Admit: 2008-05-29 | Discharge: 2008-05-29 | Payer: Self-pay | Admitting: Hematology & Oncology

## 2008-07-11 ENCOUNTER — Ambulatory Visit: Payer: Self-pay | Admitting: Hematology & Oncology

## 2008-07-12 LAB — CBC WITH DIFFERENTIAL (CANCER CENTER ONLY)
BASO#: 0 10*3/uL (ref 0.0–0.2)
BASO%: 0.6 % (ref 0.0–2.0)
EOS%: 4.7 % (ref 0.0–7.0)
Eosinophils Absolute: 0.2 10*3/uL (ref 0.0–0.5)
HCT: 34.7 % — ABNORMAL LOW (ref 34.8–46.6)
HGB: 11.7 g/dL (ref 11.6–15.9)
LYMPH#: 1.3 10*3/uL (ref 0.9–3.3)
LYMPH%: 36.1 % (ref 14.0–48.0)
MCH: 28.1 pg (ref 26.0–34.0)
MCHC: 33.7 g/dL (ref 32.0–36.0)
MCV: 83 fL (ref 81–101)
MONO#: 0.3 10*3/uL (ref 0.1–0.9)
MONO%: 8.4 % (ref 0.0–13.0)
NEUT#: 1.8 10*3/uL (ref 1.5–6.5)
NEUT%: 50.2 % (ref 39.6–80.0)
Platelets: 214 10*3/uL (ref 145–400)
RBC: 4.16 10*6/uL (ref 3.70–5.32)
RDW: 14 % (ref 10.5–14.6)
WBC: 3.6 10*3/uL — ABNORMAL LOW (ref 3.9–10.0)

## 2008-07-12 LAB — COMPREHENSIVE METABOLIC PANEL
ALT: 9 U/L (ref 0–35)
AST: 16 U/L (ref 0–37)
Albumin: 3.9 g/dL (ref 3.5–5.2)
Alkaline Phosphatase: 51 U/L (ref 39–117)
BUN: 16 mg/dL (ref 6–23)
CO2: 22 mEq/L (ref 19–32)
Calcium: 8.5 mg/dL (ref 8.4–10.5)
Chloride: 109 mEq/L (ref 96–112)
Creatinine, Ser: 0.74 mg/dL (ref 0.40–1.20)
Glucose, Bld: 113 mg/dL — ABNORMAL HIGH (ref 70–99)
Potassium: 4.1 mEq/L (ref 3.5–5.3)
Sodium: 141 mEq/L (ref 135–145)
Total Bilirubin: 1.1 mg/dL (ref 0.3–1.2)
Total Protein: 6.7 g/dL (ref 6.0–8.3)

## 2008-07-12 LAB — CANCER ANTIGEN 27.29: CA 27.29: 35 U/mL (ref 0–39)

## 2008-09-26 ENCOUNTER — Ambulatory Visit: Payer: Self-pay | Admitting: Hematology & Oncology

## 2008-09-27 LAB — CBC WITH DIFFERENTIAL (CANCER CENTER ONLY)
BASO#: 0 10*3/uL (ref 0.0–0.2)
BASO%: 1 % (ref 0.0–2.0)
EOS%: 3.8 % (ref 0.0–7.0)
Eosinophils Absolute: 0.1 10*3/uL (ref 0.0–0.5)
HCT: 35.1 % (ref 34.8–46.6)
HGB: 12 g/dL (ref 11.6–15.9)
LYMPH#: 1.1 10*3/uL (ref 0.9–3.3)
LYMPH%: 34 % (ref 14.0–48.0)
MCH: 28 pg (ref 26.0–34.0)
MCHC: 34.2 g/dL (ref 32.0–36.0)
MCV: 82 fL (ref 81–101)
MONO#: 0.3 10*3/uL (ref 0.1–0.9)
MONO%: 8.6 % (ref 0.0–13.0)
NEUT#: 1.8 10*3/uL (ref 1.5–6.5)
NEUT%: 52.6 % (ref 39.6–80.0)
Platelets: 215 10*3/uL (ref 145–400)
RBC: 4.28 10*6/uL (ref 3.70–5.32)
RDW: 14 % (ref 10.5–14.6)
WBC: 3.3 10*3/uL — ABNORMAL LOW (ref 3.9–10.0)

## 2008-09-27 LAB — COMPREHENSIVE METABOLIC PANEL
ALT: 9 U/L (ref 0–35)
AST: 18 U/L (ref 0–37)
Albumin: 4 g/dL (ref 3.5–5.2)
Alkaline Phosphatase: 56 U/L (ref 39–117)
BUN: 17 mg/dL (ref 6–23)
CO2: 22 mEq/L (ref 19–32)
Calcium: 8.6 mg/dL (ref 8.4–10.5)
Chloride: 106 mEq/L (ref 96–112)
Creatinine, Ser: 0.9 mg/dL (ref 0.40–1.20)
Glucose, Bld: 107 mg/dL — ABNORMAL HIGH (ref 70–99)
Potassium: 4.3 mEq/L (ref 3.5–5.3)
Sodium: 136 mEq/L (ref 135–145)
Total Bilirubin: 0.9 mg/dL (ref 0.3–1.2)
Total Protein: 7.1 g/dL (ref 6.0–8.3)

## 2008-09-27 LAB — CANCER ANTIGEN 27.29: CA 27.29: 34 U/mL (ref 0–39)

## 2008-12-18 ENCOUNTER — Ambulatory Visit (HOSPITAL_COMMUNITY): Admission: RE | Admit: 2008-12-18 | Discharge: 2008-12-18 | Payer: Self-pay | Admitting: Hematology & Oncology

## 2008-12-25 ENCOUNTER — Ambulatory Visit: Payer: Self-pay | Admitting: Hematology & Oncology

## 2008-12-27 LAB — CBC WITH DIFFERENTIAL (CANCER CENTER ONLY)
BASO#: 0.1 10*3/uL (ref 0.0–0.2)
BASO%: 1.8 % (ref 0.0–2.0)
EOS%: 4.5 % (ref 0.0–7.0)
Eosinophils Absolute: 0.2 10*3/uL (ref 0.0–0.5)
HCT: 36.3 % (ref 34.8–46.6)
HGB: 12.3 g/dL (ref 11.6–15.9)
LYMPH#: 1.4 10*3/uL (ref 0.9–3.3)
LYMPH%: 34.3 % (ref 14.0–48.0)
MCH: 28.2 pg (ref 26.0–34.0)
MCHC: 34 g/dL (ref 32.0–36.0)
MCV: 83 fL (ref 81–101)
MONO#: 0.3 10*3/uL (ref 0.1–0.9)
MONO%: 6.8 % (ref 0.0–13.0)
NEUT#: 2.2 10*3/uL (ref 1.5–6.5)
NEUT%: 52.6 % (ref 39.6–80.0)
Platelets: 230 10*3/uL (ref 145–400)
RBC: 4.37 10*6/uL (ref 3.70–5.32)
RDW: 13.8 % (ref 10.5–14.6)
WBC: 4.2 10*3/uL (ref 3.9–10.0)

## 2008-12-27 LAB — COMPREHENSIVE METABOLIC PANEL
ALT: 11 U/L (ref 0–35)
AST: 22 U/L (ref 0–37)
Albumin: 4.3 g/dL (ref 3.5–5.2)
Alkaline Phosphatase: 56 U/L (ref 39–117)
BUN: 16 mg/dL (ref 6–23)
CO2: 22 mEq/L (ref 19–32)
Calcium: 9.2 mg/dL (ref 8.4–10.5)
Chloride: 109 mEq/L (ref 96–112)
Creatinine, Ser: 0.97 mg/dL (ref 0.40–1.20)
Glucose, Bld: 87 mg/dL (ref 70–99)
Potassium: 4.4 mEq/L (ref 3.5–5.3)
Sodium: 140 mEq/L (ref 135–145)
Total Bilirubin: 1.2 mg/dL (ref 0.3–1.2)
Total Protein: 7.1 g/dL (ref 6.0–8.3)

## 2008-12-27 LAB — CANCER ANTIGEN 27.29: CA 27.29: 34 U/mL (ref 0–39)

## 2009-01-03 ENCOUNTER — Ambulatory Visit (HOSPITAL_COMMUNITY): Admission: RE | Admit: 2009-01-03 | Discharge: 2009-01-03 | Payer: Self-pay | Admitting: Hematology & Oncology

## 2009-01-24 ENCOUNTER — Encounter (INDEPENDENT_AMBULATORY_CARE_PROVIDER_SITE_OTHER): Payer: Self-pay | Admitting: Interventional Radiology

## 2009-01-24 ENCOUNTER — Ambulatory Visit (HOSPITAL_COMMUNITY): Admission: RE | Admit: 2009-01-24 | Discharge: 2009-01-24 | Payer: Self-pay | Admitting: Hematology & Oncology

## 2009-02-21 ENCOUNTER — Ambulatory Visit: Payer: Self-pay | Admitting: Hematology & Oncology

## 2009-04-18 ENCOUNTER — Ambulatory Visit: Payer: Self-pay | Admitting: Hematology & Oncology

## 2009-04-19 LAB — CBC WITH DIFFERENTIAL (CANCER CENTER ONLY)
BASO#: 0 10*3/uL (ref 0.0–0.2)
BASO%: 0.8 % (ref 0.0–2.0)
EOS%: 5.2 % (ref 0.0–7.0)
Eosinophils Absolute: 0.2 10*3/uL (ref 0.0–0.5)
HCT: 34 % — ABNORMAL LOW (ref 34.8–46.6)
HGB: 11.9 g/dL (ref 11.6–15.9)
LYMPH#: 1.2 10*3/uL (ref 0.9–3.3)
LYMPH%: 35.9 % (ref 14.0–48.0)
MCH: 28.8 pg (ref 26.0–34.0)
MCHC: 35 g/dL (ref 32.0–36.0)
MCV: 82 fL (ref 81–101)
MONO#: 0.3 10*3/uL (ref 0.1–0.9)
MONO%: 9.5 % (ref 0.0–13.0)
NEUT#: 1.6 10*3/uL (ref 1.5–6.5)
NEUT%: 48.6 % (ref 39.6–80.0)
Platelets: 184 10*3/uL (ref 145–400)
RBC: 4.13 10*6/uL (ref 3.70–5.32)
RDW: 13.7 % (ref 10.5–14.6)
WBC: 3.3 10*3/uL — ABNORMAL LOW (ref 3.9–10.0)

## 2009-04-20 LAB — COMPREHENSIVE METABOLIC PANEL
ALT: 9 U/L (ref 0–35)
AST: 18 U/L (ref 0–37)
Albumin: 4.4 g/dL (ref 3.5–5.2)
Alkaline Phosphatase: 56 U/L (ref 39–117)
BUN: 13 mg/dL (ref 6–23)
CO2: 20 mEq/L (ref 19–32)
Calcium: 9 mg/dL (ref 8.4–10.5)
Chloride: 110 mEq/L (ref 96–112)
Creatinine, Ser: 0.78 mg/dL (ref 0.40–1.20)
Glucose, Bld: 80 mg/dL (ref 70–99)
Potassium: 4.1 mEq/L (ref 3.5–5.3)
Sodium: 141 mEq/L (ref 135–145)
Total Bilirubin: 0.9 mg/dL (ref 0.3–1.2)
Total Protein: 7 g/dL (ref 6.0–8.3)

## 2009-04-20 LAB — CANCER ANTIGEN 27.29: CA 27.29: 31 U/mL (ref 0–39)

## 2009-04-20 LAB — VITAMIN D 25 HYDROXY (VIT D DEFICIENCY, FRACTURES): Vit D, 25-Hydroxy: 25 ng/mL — ABNORMAL LOW (ref 30–89)

## 2009-06-11 ENCOUNTER — Ambulatory Visit (HOSPITAL_COMMUNITY): Admission: RE | Admit: 2009-06-11 | Discharge: 2009-06-11 | Payer: Self-pay | Admitting: Hematology & Oncology

## 2009-06-19 ENCOUNTER — Ambulatory Visit: Payer: Self-pay | Admitting: Hematology & Oncology

## 2009-06-21 LAB — CBC WITH DIFFERENTIAL (CANCER CENTER ONLY)
BASO#: 0 10*3/uL (ref 0.0–0.2)
BASO%: 0.5 % (ref 0.0–2.0)
EOS%: 4.6 % (ref 0.0–7.0)
Eosinophils Absolute: 0.2 10*3/uL (ref 0.0–0.5)
HCT: 36.6 % (ref 34.8–46.6)
HGB: 12.4 g/dL (ref 11.6–15.9)
LYMPH#: 1.2 10*3/uL (ref 0.9–3.3)
LYMPH%: 36.5 % (ref 14.0–48.0)
MCH: 28.4 pg (ref 26.0–34.0)
MCHC: 33.8 g/dL (ref 32.0–36.0)
MCV: 84 fL (ref 81–101)
MONO#: 0.3 10*3/uL (ref 0.1–0.9)
MONO%: 8.3 % (ref 0.0–13.0)
NEUT#: 1.7 10*3/uL (ref 1.5–6.5)
NEUT%: 50.1 % (ref 39.6–80.0)
Platelets: 211 10*3/uL (ref 145–400)
RBC: 4.36 10*6/uL (ref 3.70–5.32)
RDW: 13.2 % (ref 10.5–14.6)
WBC: 3.4 10*3/uL — ABNORMAL LOW (ref 3.9–10.0)

## 2009-07-15 ENCOUNTER — Emergency Department (HOSPITAL_COMMUNITY): Admission: EM | Admit: 2009-07-15 | Discharge: 2009-07-15 | Payer: Self-pay | Admitting: Emergency Medicine

## 2009-07-31 ENCOUNTER — Ambulatory Visit: Payer: Self-pay | Admitting: Hematology & Oncology

## 2009-08-02 LAB — CBC WITH DIFFERENTIAL (CANCER CENTER ONLY)
BASO#: 0 10*3/uL (ref 0.0–0.2)
BASO%: 0.8 % (ref 0.0–2.0)
EOS%: 4.2 % (ref 0.0–7.0)
Eosinophils Absolute: 0.1 10*3/uL (ref 0.0–0.5)
HCT: 38.6 % (ref 34.8–46.6)
HGB: 13.1 g/dL (ref 11.6–15.9)
LYMPH#: 1.5 10*3/uL (ref 0.9–3.3)
LYMPH%: 42.2 % (ref 14.0–48.0)
MCH: 28.4 pg (ref 26.0–34.0)
MCHC: 33.8 g/dL (ref 32.0–36.0)
MCV: 84 fL (ref 81–101)
MONO#: 0.3 10*3/uL (ref 0.1–0.9)
MONO%: 7.4 % (ref 0.0–13.0)
NEUT#: 1.6 10*3/uL (ref 1.5–6.5)
NEUT%: 45.4 % (ref 39.6–80.0)
Platelets: 216 10*3/uL (ref 145–400)
RBC: 4.59 10*6/uL (ref 3.70–5.32)
RDW: 12.6 % (ref 10.5–14.6)
WBC: 3.5 10*3/uL — ABNORMAL LOW (ref 3.9–10.0)

## 2009-08-02 LAB — COMPREHENSIVE METABOLIC PANEL
ALT: 15 U/L (ref 0–35)
AST: 21 U/L (ref 0–37)
Albumin: 4.1 g/dL (ref 3.5–5.2)
Alkaline Phosphatase: 57 U/L (ref 39–117)
BUN: 17 mg/dL (ref 6–23)
CO2: 24 mEq/L (ref 19–32)
Calcium: 9.2 mg/dL (ref 8.4–10.5)
Chloride: 105 mEq/L (ref 96–112)
Creatinine, Ser: 0.95 mg/dL (ref 0.40–1.20)
Glucose, Bld: 98 mg/dL (ref 70–99)
Potassium: 4.4 mEq/L (ref 3.5–5.3)
Sodium: 141 mEq/L (ref 135–145)
Total Bilirubin: 0.9 mg/dL (ref 0.3–1.2)
Total Protein: 7 g/dL (ref 6.0–8.3)

## 2009-08-02 LAB — CANCER ANTIGEN 27.29: CA 27.29: 34 U/mL (ref 0–39)

## 2009-08-02 LAB — VITAMIN D 25 HYDROXY (VIT D DEFICIENCY, FRACTURES): Vit D, 25-Hydroxy: 27 ng/mL — ABNORMAL LOW (ref 30–89)

## 2009-09-09 ENCOUNTER — Emergency Department (HOSPITAL_COMMUNITY): Admission: EM | Admit: 2009-09-09 | Discharge: 2009-09-09 | Payer: Self-pay | Admitting: Emergency Medicine

## 2009-09-24 ENCOUNTER — Ambulatory Visit: Payer: Self-pay | Admitting: Hematology & Oncology

## 2009-09-27 LAB — CBC WITH DIFFERENTIAL (CANCER CENTER ONLY)
BASO#: 0 10*3/uL (ref 0.0–0.2)
BASO%: 0.9 % (ref 0.0–2.0)
EOS%: 4.4 % (ref 0.0–7.0)
Eosinophils Absolute: 0.2 10*3/uL (ref 0.0–0.5)
HCT: 36.2 % (ref 34.8–46.6)
HGB: 12.1 g/dL (ref 11.6–15.9)
LYMPH#: 1.3 10*3/uL (ref 0.9–3.3)
LYMPH%: 33.9 % (ref 14.0–48.0)
MCH: 28 pg (ref 26.0–34.0)
MCHC: 33.4 g/dL (ref 32.0–36.0)
MCV: 84 fL (ref 81–101)
MONO#: 0.4 10*3/uL (ref 0.1–0.9)
MONO%: 9 % (ref 0.0–13.0)
NEUT#: 2 10*3/uL (ref 1.5–6.5)
NEUT%: 51.8 % (ref 39.6–80.0)
Platelets: 217 10*3/uL (ref 145–400)
RBC: 4.32 10*6/uL (ref 3.70–5.32)
RDW: 13.3 % (ref 10.5–14.6)
WBC: 3.9 10*3/uL (ref 3.9–10.0)

## 2009-09-27 LAB — COMPREHENSIVE METABOLIC PANEL
ALT: 13 U/L (ref 0–35)
AST: 20 U/L (ref 0–37)
Albumin: 4.3 g/dL (ref 3.5–5.2)
Alkaline Phosphatase: 57 U/L (ref 39–117)
BUN: 18 mg/dL (ref 6–23)
CO2: 24 mEq/L (ref 19–32)
Calcium: 9 mg/dL (ref 8.4–10.5)
Chloride: 107 mEq/L (ref 96–112)
Creatinine, Ser: 0.96 mg/dL (ref 0.40–1.20)
Glucose, Bld: 81 mg/dL (ref 70–99)
Potassium: 4.3 mEq/L (ref 3.5–5.3)
Sodium: 140 mEq/L (ref 135–145)
Total Bilirubin: 1.1 mg/dL (ref 0.3–1.2)
Total Protein: 7.3 g/dL (ref 6.0–8.3)

## 2009-09-27 LAB — CANCER ANTIGEN 27.29: CA 27.29: 41 U/mL — ABNORMAL HIGH (ref 0–39)

## 2009-10-23 ENCOUNTER — Ambulatory Visit (HOSPITAL_COMMUNITY): Admission: RE | Admit: 2009-10-23 | Discharge: 2009-10-23 | Payer: Self-pay | Admitting: Hematology & Oncology

## 2009-10-30 ENCOUNTER — Ambulatory Visit: Payer: Self-pay | Admitting: Hematology & Oncology

## 2009-12-12 ENCOUNTER — Ambulatory Visit: Payer: Self-pay | Admitting: Hematology & Oncology

## 2009-12-13 LAB — COMPREHENSIVE METABOLIC PANEL
ALT: 11 U/L (ref 0–35)
AST: 21 U/L (ref 0–37)
Albumin: 4.1 g/dL (ref 3.5–5.2)
Alkaline Phosphatase: 52 U/L (ref 39–117)
BUN: 18 mg/dL (ref 6–23)
CO2: 19 mEq/L (ref 19–32)
Calcium: 9.2 mg/dL (ref 8.4–10.5)
Chloride: 108 mEq/L (ref 96–112)
Creatinine, Ser: 0.9 mg/dL (ref 0.40–1.20)
Glucose, Bld: 95 mg/dL (ref 70–99)
Potassium: 4.1 mEq/L (ref 3.5–5.3)
Sodium: 139 mEq/L (ref 135–145)
Total Bilirubin: 1.2 mg/dL (ref 0.3–1.2)
Total Protein: 7.2 g/dL (ref 6.0–8.3)

## 2009-12-13 LAB — CBC WITH DIFFERENTIAL (CANCER CENTER ONLY)
BASO#: 0 10*3/uL (ref 0.0–0.2)
BASO%: 1.1 % (ref 0.0–2.0)
EOS%: 5.1 % (ref 0.0–7.0)
Eosinophils Absolute: 0.2 10*3/uL (ref 0.0–0.5)
HCT: 36.7 % (ref 34.8–46.6)
HGB: 12.5 g/dL (ref 11.6–15.9)
LYMPH#: 1.2 10*3/uL (ref 0.9–3.3)
LYMPH%: 31.8 % (ref 14.0–48.0)
MCH: 28.4 pg (ref 26.0–34.0)
MCHC: 33.9 g/dL (ref 32.0–36.0)
MCV: 84 fL (ref 81–101)
MONO#: 0.3 10*3/uL (ref 0.1–0.9)
MONO%: 8.7 % (ref 0.0–13.0)
NEUT#: 2.1 10*3/uL (ref 1.5–6.5)
NEUT%: 53.3 % (ref 39.6–80.0)
Platelets: 213 10*3/uL (ref 145–400)
RBC: 4.38 10*6/uL (ref 3.70–5.32)
RDW: 13 % (ref 10.5–14.6)
WBC: 3.9 10*3/uL (ref 3.9–10.0)

## 2009-12-13 LAB — CANCER ANTIGEN 27.29: CA 27.29: 40 U/mL — ABNORMAL HIGH (ref 0–39)

## 2009-12-13 LAB — VITAMIN D 25 HYDROXY (VIT D DEFICIENCY, FRACTURES): Vit D, 25-Hydroxy: 35 ng/mL (ref 30–89)

## 2010-01-02 ENCOUNTER — Encounter: Admission: RE | Admit: 2010-01-02 | Discharge: 2010-01-02 | Payer: Self-pay | Admitting: Family Medicine

## 2010-03-03 ENCOUNTER — Ambulatory Visit (HOSPITAL_COMMUNITY): Admission: RE | Admit: 2010-03-03 | Discharge: 2010-03-03 | Payer: Self-pay | Admitting: Hematology & Oncology

## 2010-03-12 ENCOUNTER — Ambulatory Visit: Payer: Self-pay | Admitting: Hematology & Oncology

## 2010-03-14 LAB — COMPREHENSIVE METABOLIC PANEL
ALT: 15 U/L (ref 0–35)
AST: 21 U/L (ref 0–37)
Albumin: 4 g/dL (ref 3.5–5.2)
Alkaline Phosphatase: 55 U/L (ref 39–117)
BUN: 16 mg/dL (ref 6–23)
CO2: 25 mEq/L (ref 19–32)
Calcium: 9.2 mg/dL (ref 8.4–10.5)
Chloride: 109 mEq/L (ref 96–112)
Creatinine, Ser: 0.83 mg/dL (ref 0.40–1.20)
Glucose, Bld: 80 mg/dL (ref 70–99)
Potassium: 4.5 mEq/L (ref 3.5–5.3)
Sodium: 142 mEq/L (ref 135–145)
Total Bilirubin: 1.1 mg/dL (ref 0.3–1.2)
Total Protein: 6.3 g/dL (ref 6.0–8.3)

## 2010-03-14 LAB — CBC WITH DIFFERENTIAL (CANCER CENTER ONLY)
BASO#: 0 10*3/uL (ref 0.0–0.2)
BASO%: 0.6 % (ref 0.0–2.0)
EOS%: 5.9 % (ref 0.0–7.0)
Eosinophils Absolute: 0.2 10*3/uL (ref 0.0–0.5)
HCT: 36 % (ref 34.8–46.6)
HGB: 12.1 g/dL (ref 11.6–15.9)
LYMPH#: 1.1 10*3/uL (ref 0.9–3.3)
LYMPH%: 31.4 % (ref 14.0–48.0)
MCH: 28 pg (ref 26.0–34.0)
MCHC: 33.5 g/dL (ref 32.0–36.0)
MCV: 84 fL (ref 81–101)
MONO#: 0.3 10*3/uL (ref 0.1–0.9)
MONO%: 9.1 % (ref 0.0–13.0)
NEUT#: 1.8 10*3/uL (ref 1.5–6.5)
NEUT%: 53 % (ref 39.6–80.0)
Platelets: 203 10*3/uL (ref 145–400)
RBC: 4.31 10*6/uL (ref 3.70–5.32)
RDW: 14.5 % (ref 10.5–14.6)
WBC: 3.3 10*3/uL — ABNORMAL LOW (ref 3.9–10.0)

## 2010-03-14 LAB — VITAMIN D 25 HYDROXY (VIT D DEFICIENCY, FRACTURES): Vit D, 25-Hydroxy: 33 ng/mL (ref 30–89)

## 2010-03-14 LAB — CANCER ANTIGEN 27.29: CA 27.29: 36 U/mL (ref 0–39)

## 2010-04-01 ENCOUNTER — Emergency Department (HOSPITAL_COMMUNITY): Admission: EM | Admit: 2010-04-01 | Discharge: 2010-04-01 | Payer: Self-pay | Admitting: Family Medicine

## 2010-06-12 ENCOUNTER — Ambulatory Visit: Payer: Self-pay | Admitting: Hematology & Oncology

## 2010-06-13 LAB — VITAMIN D 25 HYDROXY (VIT D DEFICIENCY, FRACTURES): Vit D, 25-Hydroxy: 43 ng/mL (ref 30–89)

## 2010-06-13 LAB — CBC WITH DIFFERENTIAL (CANCER CENTER ONLY)
BASO#: 0 10*3/uL (ref 0.0–0.2)
BASO%: 0.4 % (ref 0.0–2.0)
EOS%: 3.5 % (ref 0.0–7.0)
Eosinophils Absolute: 0.1 10*3/uL (ref 0.0–0.5)
HCT: 38.5 % (ref 34.8–46.6)
HGB: 12.8 g/dL (ref 11.6–15.9)
LYMPH#: 1.3 10*3/uL (ref 0.9–3.3)
LYMPH%: 34.9 % (ref 14.0–48.0)
MCH: 27.9 pg (ref 26.0–34.0)
MCHC: 33.2 g/dL (ref 32.0–36.0)
MCV: 84 fL (ref 81–101)
MONO#: 0.3 10*3/uL (ref 0.1–0.9)
MONO%: 8.5 % (ref 0.0–13.0)
NEUT#: 1.9 10*3/uL (ref 1.5–6.5)
NEUT%: 52.7 % (ref 39.6–80.0)
Platelets: 208 10*3/uL (ref 145–400)
RBC: 4.57 10*6/uL (ref 3.70–5.32)
RDW: 12.8 % (ref 10.5–14.6)
WBC: 3.6 10*3/uL — ABNORMAL LOW (ref 3.9–10.0)

## 2010-06-13 LAB — COMPREHENSIVE METABOLIC PANEL
ALT: 10 U/L (ref 0–35)
AST: 17 U/L (ref 0–37)
Albumin: 4.4 g/dL (ref 3.5–5.2)
Alkaline Phosphatase: 49 U/L (ref 39–117)
BUN: 14 mg/dL (ref 6–23)
CO2: 21 mEq/L (ref 19–32)
Calcium: 9.1 mg/dL (ref 8.4–10.5)
Chloride: 107 mEq/L (ref 96–112)
Creatinine, Ser: 0.89 mg/dL (ref 0.40–1.20)
Glucose, Bld: 87 mg/dL (ref 70–99)
Potassium: 3.8 mEq/L (ref 3.5–5.3)
Sodium: 140 mEq/L (ref 135–145)
Total Bilirubin: 1.3 mg/dL — ABNORMAL HIGH (ref 0.3–1.2)
Total Protein: 7.1 g/dL (ref 6.0–8.3)

## 2010-06-13 LAB — CANCER ANTIGEN 27.29: CA 27.29: 35 U/mL (ref 0–39)

## 2010-06-29 ENCOUNTER — Encounter: Payer: Self-pay | Admitting: Hematology & Oncology

## 2010-06-29 ENCOUNTER — Encounter: Payer: Self-pay | Admitting: General Surgery

## 2010-06-30 ENCOUNTER — Encounter: Payer: Self-pay | Admitting: Interventional Radiology

## 2010-07-04 ENCOUNTER — Ambulatory Visit (HOSPITAL_COMMUNITY)
Admission: RE | Admit: 2010-07-04 | Discharge: 2010-07-04 | Payer: Self-pay | Source: Home / Self Care | Attending: Hematology & Oncology | Admitting: Hematology & Oncology

## 2010-07-16 ENCOUNTER — Other Ambulatory Visit: Payer: Self-pay | Admitting: Hematology & Oncology

## 2010-07-16 ENCOUNTER — Encounter (HOSPITAL_BASED_OUTPATIENT_CLINIC_OR_DEPARTMENT_OTHER): Payer: BLUE CROSS/BLUE SHIELD | Admitting: Hematology & Oncology

## 2010-07-16 DIAGNOSIS — C7952 Secondary malignant neoplasm of bone marrow: Secondary | ICD-10-CM

## 2010-07-16 DIAGNOSIS — C7951 Secondary malignant neoplasm of bone: Secondary | ICD-10-CM

## 2010-07-16 DIAGNOSIS — C50919 Malignant neoplasm of unspecified site of unspecified female breast: Secondary | ICD-10-CM

## 2010-07-16 LAB — CBC WITH DIFFERENTIAL (CANCER CENTER ONLY)
BASO#: 0 10*3/uL (ref 0.0–0.2)
BASO%: 0.4 % (ref 0.0–2.0)
EOS%: 4.1 % (ref 0.0–7.0)
Eosinophils Absolute: 0.2 10*3/uL (ref 0.0–0.5)
HCT: 37.4 % (ref 34.8–46.6)
HGB: 12.7 g/dL (ref 11.6–15.9)
LYMPH#: 1.5 10*3/uL (ref 0.9–3.3)
LYMPH%: 31 % (ref 14.0–48.0)
MCH: 28.2 pg (ref 26.0–34.0)
MCHC: 33.9 g/dL (ref 32.0–36.0)
MCV: 83 fL (ref 81–101)
MONO#: 0.4 10*3/uL (ref 0.1–0.9)
MONO%: 8.3 % (ref 0.0–13.0)
NEUT#: 2.7 10*3/uL (ref 1.5–6.5)
NEUT%: 56.2 % (ref 39.6–80.0)
Platelets: 201 10*3/uL (ref 145–400)
RBC: 4.49 10*6/uL (ref 3.70–5.32)
RDW: 14 % (ref 10.5–14.6)
WBC: 4.8 10*3/uL (ref 3.9–10.0)

## 2010-07-17 LAB — VITAMIN D 25 HYDROXY (VIT D DEFICIENCY, FRACTURES): Vit D, 25-Hydroxy: 43 ng/mL (ref 30–89)

## 2010-07-17 LAB — COMPREHENSIVE METABOLIC PANEL
ALT: 17 U/L (ref 0–35)
AST: 22 U/L (ref 0–37)
Albumin: 4.2 g/dL (ref 3.5–5.2)
Alkaline Phosphatase: 68 U/L (ref 39–117)
BUN: 19 mg/dL (ref 6–23)
CO2: 25 mEq/L (ref 19–32)
Calcium: 9.4 mg/dL (ref 8.4–10.5)
Chloride: 107 mEq/L (ref 96–112)
Creatinine, Ser: 0.89 mg/dL (ref 0.40–1.20)
Glucose, Bld: 104 mg/dL — ABNORMAL HIGH (ref 70–99)
Potassium: 4.3 mEq/L (ref 3.5–5.3)
Sodium: 144 mEq/L (ref 135–145)
Total Bilirubin: 1.1 mg/dL (ref 0.3–1.2)
Total Protein: 6.9 g/dL (ref 6.0–8.3)

## 2010-07-17 LAB — CANCER ANTIGEN 27.29: CA 27.29: 34 U/mL (ref 0–39)

## 2010-08-14 ENCOUNTER — Other Ambulatory Visit: Payer: Self-pay | Admitting: Obstetrics and Gynecology

## 2010-08-29 LAB — POCT URINALYSIS DIP (DEVICE)
Bilirubin Urine: NEGATIVE
Glucose, UA: NEGATIVE mg/dL
Ketones, ur: NEGATIVE mg/dL
Nitrite: NEGATIVE
Protein, ur: NEGATIVE mg/dL
Specific Gravity, Urine: 1.025 (ref 1.005–1.030)
Urobilinogen, UA: 0.2 mg/dL (ref 0.0–1.0)
pH: 6.5 (ref 5.0–8.0)

## 2010-09-13 LAB — CBC
HCT: 38.2 % (ref 36.0–46.0)
Hemoglobin: 13 g/dL (ref 12.0–15.0)
MCHC: 34.1 g/dL (ref 30.0–36.0)
MCV: 85.8 fL (ref 78.0–100.0)
Platelets: 213 10*3/uL (ref 150–400)
RBC: 4.45 MIL/uL (ref 3.87–5.11)
RDW: 14.7 % (ref 11.5–15.5)
WBC: 4.3 10*3/uL (ref 4.0–10.5)

## 2010-09-13 LAB — BASIC METABOLIC PANEL
BUN: 16 mg/dL (ref 6–23)
CO2: 28 mEq/L (ref 19–32)
Calcium: 9.2 mg/dL (ref 8.4–10.5)
Chloride: 106 mEq/L (ref 96–112)
Creatinine, Ser: 0.95 mg/dL (ref 0.4–1.2)
GFR calc Af Amer: >60 mL/min (ref >60)
GFR calc non Af Amer: 56 mL/min — ABNORMAL LOW (ref >60)
Glucose, Bld: 92 mg/dL (ref 70–99)
Potassium: 4 mEq/L (ref 3.5–5.1)
Sodium: 140 mEq/L (ref 135–145)

## 2010-09-13 LAB — PROTIME-INR
INR: 1.1 (ref 0.00–1.49)
Prothrombin Time: 14 seconds (ref 11.6–15.2)

## 2010-09-13 LAB — APTT: aPTT: 26 seconds (ref 24–37)

## 2010-10-13 ENCOUNTER — Encounter (HOSPITAL_BASED_OUTPATIENT_CLINIC_OR_DEPARTMENT_OTHER): Payer: BLUE CROSS/BLUE SHIELD | Admitting: Hematology & Oncology

## 2010-10-13 ENCOUNTER — Other Ambulatory Visit: Payer: Self-pay | Admitting: Hematology & Oncology

## 2010-10-13 DIAGNOSIS — C50919 Malignant neoplasm of unspecified site of unspecified female breast: Secondary | ICD-10-CM

## 2010-10-13 DIAGNOSIS — Z23 Encounter for immunization: Secondary | ICD-10-CM

## 2010-10-13 LAB — CBC WITH DIFFERENTIAL (CANCER CENTER ONLY)
BASO#: 0 10*3/uL (ref 0.0–0.2)
BASO%: 0.5 % (ref 0.0–2.0)
EOS%: 2.8 % (ref 0.0–7.0)
Eosinophils Absolute: 0.1 10*3/uL (ref 0.0–0.5)
HCT: 36.1 % (ref 34.8–46.6)
HGB: 12.2 g/dL (ref 11.6–15.9)
LYMPH#: 1.3 10*3/uL (ref 0.9–3.3)
LYMPH%: 31.4 % (ref 14.0–48.0)
MCH: 28.2 pg (ref 26.0–34.0)
MCHC: 33.8 g/dL (ref 32.0–36.0)
MCV: 83 fL (ref 81–101)
MONO#: 0.3 10*3/uL (ref 0.1–0.9)
MONO%: 8.5 % (ref 0.0–13.0)
NEUT#: 2.3 10*3/uL (ref 1.5–6.5)
NEUT%: 56.8 % (ref 39.6–80.0)
Platelets: 167 10*3/uL (ref 145–400)
RBC: 4.33 10*6/uL (ref 3.70–5.32)
RDW: 14.1 % (ref 11.1–15.7)
WBC: 4 10*3/uL (ref 3.9–10.0)

## 2010-10-13 LAB — COMPREHENSIVE METABOLIC PANEL
ALT: 10 U/L (ref 0–35)
AST: 16 U/L (ref 0–37)
Albumin: 4.2 g/dL (ref 3.5–5.2)
Alkaline Phosphatase: 49 U/L (ref 39–117)
BUN: 14 mg/dL (ref 6–23)
CO2: 26 mEq/L (ref 19–32)
Calcium: 9.1 mg/dL (ref 8.4–10.5)
Chloride: 108 mEq/L (ref 96–112)
Creatinine, Ser: 0.91 mg/dL (ref 0.40–1.20)
Glucose, Bld: 95 mg/dL (ref 70–99)
Potassium: 4.4 mEq/L (ref 3.5–5.3)
Sodium: 143 mEq/L (ref 135–145)
Total Bilirubin: 1.3 mg/dL — ABNORMAL HIGH (ref 0.3–1.2)
Total Protein: 6.7 g/dL (ref 6.0–8.3)

## 2010-10-13 LAB — CANCER ANTIGEN 27.29: CA 27.29: 33 U/mL (ref 0–39)

## 2010-10-21 NOTE — Discharge Summary (Signed)
NAMESHLOKA, RIFF               ACCOUNT NO.:  [id number]   MEDICAL RECORD NO.:  [id number]          PATIENT TYPE:  INP   LOCATION:  3705                         FACILITY:  [facility]   PHYSICIAN:  Bryson Dames, M.D.DATE OF BIRTH:  01-01-1926   DATE OF ADMISSION:  04/15/2008  DATE OF DISCHARGE:  04/16/2008                               DISCHARGE SUMMARY   DISCHARGE DIAGNOSES:  1. Chest pain, negative myocardial infarction.  2. Plans for rule out cardiac as outpatient.  3. Paroxysmal atrial fibrillation.  4. Sinus bradycardia.  5. Dyslipidemia.  6. Hypothyroidism.  7. Anticoagulation with Coumadin.   DISCHARGE CONDITION:  Stable.  No further pain.   DISCHARGE MEDICATIONS:  1. Continue 5 mg Coumadin daily, resume as soon as she gets home.  2. Levothyroxine 75 mcg daily as before.  3. Toprol-XL 25 mg 1-1/2 tablets daily to equal 37.5 mg daily.  4. Xanax 0.25 mg as needed for anxiety.  5. Femara 2.5 mg daily.  6. Zocor 20 mg 1 every evening.  This is new for your cholesterol.  7. Norvasc 5 mg 1 daily, new for blood pressure control.   DISCHARGE INSTRUCTIONS:  1. Low-sodium heart-healthy diet.  2. Increase activity slowly.  3. You are scheduled for Lexiscan Myoview at 9:15 a.m. on April 17, 2008, at Dr. Ky Barban office, which the patient was aware of.  She      has had them before.  No caffeine until after the test, nothing to      eat or drink after midnight tonight until after the test, and no      perfume or deodorant until the test.  4. Follow up with Dr. Melvern Banker, Tuesday, May 01, 2008, at 8:30 a.m.   The patient was instructed if the test is positive for ischemia, we will  call her to have her stop her Coumadin and plans would be for a cardiac  catheterization at that point.  The patient understood this since her  husband had similar problems prior to his death.   She had no questions.   HISTORY OF PRESENT ILLNESS:  An 77 year old white female, patient of  Dr.  Melvern Banker with no prior coronary artery disease.  She has had negative  nuclear study in 2007 and EF was stable at that time.  The patient was  admitted on April 15, 2008, which she felt was indigestion started at  1 a.m.  She had not been to sleep.  She tried Zantac and Tylenol without  relief with more of a constant ache, felt air hunger, few waves of  nausea.  She had had chest discomfort previously, but not as long  lasting or as severe.  She would rate the pain as a 5/10 and it would  radiate into her left axilla.   She also 2 months ago had increased irregular heart beat with her known  history of paroxysmal AFib and had seen Dr. Melvern Banker and her Toprol had  been increased from 25 to 37.5 mg daily, which did control the  palpitations and irregular heart beat.  She  was admitted to Telemetry  Unit for further monitoring.   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   ALLERGIES:  No known allergies.   FAMILY HISTORY, SOCIAL HISTORY, AND REVIEW OF SYSTEMS:  See H and P.   PHYSICAL EXAMINATION ON DISCHARGE:  VITAL SIGNS:  Blood pressure 132/65,  pulse 63, respirations 16, temp 97.7, oxygen saturation on room air 96%.  HEART:  Regular rate and rhythm.  No obvious murmur.  LUNGS:  Clear to auscultation bilaterally.  ABDOMEN:  Positive bowel sounds.  EXTREMITIES:  Without edema.   LABORATORY DATA:  Hemoglobin on admission 12.8, hematocrit 37.7, WBC 3.9  and at discharge 5.3, platelets 206, neutrophils 57, lymph 33, monos 8,  eos 1, baso 1.  These were stable.   Coags on admission, protime 27.7, INR 2.4, PTT 30, D-dimer was negative  at 0.22.   Chemistry on admission, sodium 137, potassium 3.8, chloride 106, CO2 23,  glucose 90, BUN 13, creatinine 0.88, calcium 8.9, total bili 1.5,  alkaline phos 42, SGOT 19, SGPT 13, total protein 6.1, albumin 3.6,  calcium 9.2, magnesium 2.2.   These remained stable.   Cardiac, CK-MB ranged 48-62, MB 0.8-0.9.  Troponin-I 0.01 to less than  0.01.   Total cholesterol 213, triglycerides 53, HDL 55, and LDL was 147.   TSH was 1.802.   RADIOLOGY:  The lungs appear clear, sclerotic lesions of the right 6th  and 5th ribs anteriorly stable compared to prior chest CT, prior left  mastectomy, stable T12 compression fracture.   EKG revealed sinus rhythm without acute changes.   HOSPITAL COURSE:  The patient was admitted on April 15, 2008, with  chest pain.  Cardiac enzymes were negative.  She had sinus rhythm  throughout her hospital stay.  INR was 2.4.  Coumadin was held for 1  day.  She was hypertensive on admission with addition of Norvasc to her  medical regimen.  She had been awakened at 1 a.m. with chest pain, left  chest.  No radiation, short of breath, and nausea, but no emesis or  diaphoresis.  The plans were to rule out possible heart cath versus  nuclear study.  By April 16, 2008, she was stable, no further chest  pain, no complaints.  She wanted to be discharged home.  We felt this  was appropriate.  Our plan will be to do an outpatient stress test on  April 17, 2008.  If that is positive, the patient was informed we  would stop her Coumadin and do cardiac catheterization.  She will follow  with Dr. Melvern Banker.  I did instruct her to continue the Toprol 37.5 mg a  day as  well as Norvasc.  Her blood pressure on admission had been 190/78 and at  time of discharge was improved at 132/65.  She relates at home she  occasionally will have bradycardia and her blood pressure will be lower.  She will follow up as stated.      Otilio Carpen. Dorene Ar, N.P.    ______________________________  Bryson Dames, M.D.    LRI/MEDQ  D:  04/16/2008  T:  04/17/2008  Job:  [id number]   cc:   Bryson Dames, M.D.  Osvaldo Human, M.D.

## 2010-10-21 NOTE — Op Note (Signed)
Amy White, Amy White               ACCOUNT NO.:  [id number]   MEDICAL RECORD NO.:  [id number]          PATIENT TYPE:  EMS   LOCATION:  MAJO                         FACILITY:  [facility]   PHYSICIAN:  Satira Anis. Gramig III, M.D.DATE OF BIRTH:  08/19/1925   DATE OF PROCEDURE:  DATE OF DISCHARGE:                               OPERATIVE REPORT   Adeline Knoedler presents today to the office today.  Jewelene is a very  pleasant female who is 77 years of age and presents to the [facility]  Emergency Room for evaluation of her upper extremity predicament.  This  patient was in her usual state of health until today when she began  jumping rope and sustained a significant fall with bilateral radius  fractures.  The patient is 77 years of age, right-hand dominant.  She  has a history of metastatic breast cancer, atrial fibrillation, and is  on Coumadin.  She denies neck, back, chest, or abdominal pain.  She  denies lower extremity pain.  She is here with her son and other family  members.  The patient and I have discussed these issues at length.  She  denies loss of consciousness, black-outs, or fainting spells.  She sees  Dr. Marin Olp for her medical needs.   ALLERGIES:  PENICILLIN.   MEDICINES:  1. Femara.  2. Toprol XL.  3. Levoxyl thyroid replacement.  4. Coumadin.   PAST MEDICAL HISTORY:  Hypothyroid condition, atrial fibrillation,  metastatic breast carcinoma.   PAST SURGICAL HISTORY:  Ankle surgery in 1988, by Dr. Laurence Slate.   SOCIAL HISTORY:  She does not smoke or drink.  She lives alone but has  family members close-by for help.   PHYSICAL EXAMINATION:  GENERAL:  She is a very pleasant female, alert,  oriented, and in no acute distress.  VITAL SIGNS:  Stable.  MUSCULOSKELETAL:  The patient has normal range of motion to the lower  extremities.  No signs of infection, dystrophy, or neurovascular  compromise.  CHEST:  Clear.  ABDOMEN:  Nontender.  HEENT:  She has normal HEENT  examination.  NEUROLOGIC:  She is alert and oriented x4 and very pleasant on exam.  EXTREMITIES:  Elbows are nontender bilaterally.  Shoulders are stable.  Right wrist is tender with soft tissue swelling.  She is neurovascular  intact, has a chronic small finger deformity from prior  injury/laceration in the distant past.  I have reviewed this at length  and the findings.  X-rays of the right upper extremity show distal  radius fracture, metaphyseal in nature, minimally displaced.   The left upper extremity has significant soft tissue swelling,  displacement, and obvious deformity about the distal radius.  She is  neurovascularly intact without signs of compartment syndrome or  infection, but does have some significant ecchymosis and bleeding.   X-rays were reviewed which show a significantly displaced intra-  articular greater than three-part distal radius fracture about the left  upper extremity.  I reviewed this at length and the findings.   IMPRESSION:  Status post fall while jumping rope in a 77 year old female  with  bilateral wrist fractures, left; fracture is significantly  displaced intra-articular in greater than three parts.  The right is  minimally displaced but does have a slight bit of angulation.   PLAN:  I have discussed with her all issues in regard to right upper  extremity.  I have verbally consented for reduction and casting.  She  was given general reduction and casted with fiberglass cast technique  and three-point mold following this.  She looked quite excellent.   In regard to the left wrist, I performed a hematoma block with 15 mL of  lidocaine without epinephrine.  She tolerated this well with hand in  finger trap traction.  Skin was repaired nicely with Xeroform gauze and  she underwent a reduction.  She tolerated the reduction well and was  then placed in a sugar-tong splint with a three-point mold.  Postreduction x-rays looked much improved.  I was pleased  with the  findings.   I have discussed with her ice, elevation, finger range of motion, RTC in  8-10 days and Vicodin p.r.n. pain, 5 mg, 1-2 q.4-6 h. p.r.n. pain p.o.  I am going to try to treat her nonoperatively given her multiple medical  conditions.   She tolerated the bilateral reductions well today without difficulty,  and there were no complicating features.  We look forward to seeing her  back in the office.  All questions have been encouraged and answered.   FINAL DIAGNOSIS:  Bilateral distal radius fractures status post fall  status post closed reduction in the emergency room.           ______________________________  Satira Anis. Blanchie Dessert, M.D.     Sampson Si  D:  09/18/2007  T:  09/19/2007  Job:  [id number]   cc:   Rudell Cobb. Marin Olp, M.D.  Osvaldo Human, M.D.

## 2010-10-21 NOTE — Consult Note (Signed)
NAMECLEMMA, Amy White               ACCOUNT NO.:  [id number]   MEDICAL RECORD NO.:  [id number]          PATIENT TYPE:  OUT   LOCATION:                               FACILITY:  [facility]   PHYSICIAN:  Sanjeev K. Deveshwar, M.D.DATE OF BIRTH:  17-Feb-1926   DATE OF CONSULTATION:  01/18/2009  DATE OF DISCHARGE:                                 CONSULTATION   DATE OF SERVICE:  January 18, 2009   CHIEF COMPLAINT:  Back pain.   HISTORY OF PRESENT ILLNESS:  This is a very pleasant active 77 year old  female referred to Dr. Estanislado Pandy through the courtesy of Dr. Burney Gauze.  The patient has a history of metastatic breast cancer with  metastasis to the bone.  She had a bone scan performed on December 18, 2008  which indicated that her metastatic bone disease appeared to be stable,  although there was a new abnormality at T12.  An MRI was performed on  January 03, 2009 that showed a compression fracture at the T12 level,  although this did not appear to be consistent with metastatic disease.  The patient was also noted to have stable degenerative changes at L3 and  L4.  There is a questionable signal abnormality and enhancement  involving the S2 sacral level.  She had a stable large sclerotic  metastasis of the right medial iliac bone.  She had severe degenerative  facet arthropathy at L5-S1 on the right.   The patient has been referred to Dr. Estanislado Pandy for further evaluation of  her T12 fracture and to discuss treatment options and Dr. Marin Olp would  also like to have a biopsy performed at this level.  The patient  presents today for that evaluation.   PAST MEDICAL HISTORY:  Significant for hyperlipidemia, hypothyroidism,  previous history of pneumonia.  She has paroxysmal atrial fibrillation  and is on chronic Coumadin therapy.  Dr. Melvern Banker is her cardiologist.  She had a negative nuclear stress test in 2007.  Her ejection fraction  was noted to be stable at that time.   PAST SURGICAL HISTORY:   Significant for a right ankle surgery.  The  patient denies any previous problems with anesthesia.   ALLERGIES:  Penicillin has caused a rash.   CURRENT MEDICATIONS:  Femara, Zometa, Coumadin, metoprolol,  levothyroxine, Darvocet which she rarely uses and Xanax p.r.n.   SOCIAL HISTORY:  The patient is widowed.  She had a son; however, he  unfortunately is deceased.  She has one granddaughter who is very  supportive.  The patient lives alone in [place].  She has never  smoked.  She drinks alcohol rarely.  She worked at [employer] for  approximately 40 years.  She is now retired.   FAMILY HISTORY:  Her mother died at age 52 from dementia.  Her father  died at age 75 from COPD and heart problems.   IMPRESSION AND PLAN:  The patient presents today for further evaluation  of back pain.  She states that she fell over Easter at least a year ago  when she was jumping rope.  She states she  fell backward and fractured  both of her wrist.  She hurt her back at that time.  The pain in her  back gradually improved, however, over this past summer, her pain became  progressively worse.  Her pain appears to be very positional.  It has  not been severe, but it has been uncomfortable for her.   We discussed osteoporosis and compression fractures.  The patient was  shown some short patient educational videos.  Compression fractures were  discussed in detail.  Treatment options were also discussed including  continued treatment with pain medications and limited activity with  natural healing versus the kyphoplasty and vertebroplasty procedures.  The interventions were described in detail along with the risks and  benefits.  As noted, Dr. Marin Olp would also like to have a biopsy  performed at this level.   Dr. Estanislado Pandy reviewed the patient's MRI images with the patient.  He  pointed out the area of fracture.  The patient would like to proceed  with intervention.  She is currently on Coumadin.  She will  need to come  off her Coumadin for at least 5 days prior to the kyphoplasty procedure.  In the interim, she is to go on aspirin.  We have tentatively scheduled  her for next Thursday, the 19th at 9 a.m. in the morning.  All of the  patient's questions were answered.  She leaves today with a good  understanding of the plan.  She was told she could resume her Coumadin  the day after the procedure which would be Friday, the 20th.  She will  stop the aspirin when she resumes her Coumadin.   Greater than 45 minutes was spent on this evaluation .      Mikey Bussing, P.A.    ______________________________  Fritz Pickerel. Estanislado Pandy, M.D.    DR/MEDQ  D:  01/18/2009  T:  01/19/2009  Job:  [id number]   cc:   Rudell Cobb. Marin Olp, M.D.  Osvaldo Human, M.D.  Bryson Dames, M.D.

## 2010-10-24 NOTE — Op Note (Signed)
NAME:  Amy White, Amy White                         ACCOUNT NO.:  [id number]   MEDICAL RECORD NO.:  [id number]                   PATIENT TYPE:  AMB   LOCATION:  ENDO                                 FACILITY:  [facility]   PHYSICIAN:  Jeryl Columbia, M.D.                 DATE OF BIRTH:  [birth year]   DATE OF PROCEDURE:  02/20/2002  DATE OF DISCHARGE:                                 OPERATIVE REPORT   PROCEDURE PERFORMED:  Colonoscopy.   ENDOSCOPIST:  Jeryl Columbia, M.D.   INDICATIONS FOR PROCEDURE:  Patient with history of colon polyps, multiple  gastrointestinal complaints due for repeat screening.  Consent was signed  after the risks, benefits, methods and options were thoroughly discussed  with the patient in the office on multiple occasions.   MEDICINES USED:  Demerol 70 mg, Versed 7 mg.   DESCRIPTION OF PROCEDURE:  Rectal inspection was pertinent for external  hemorrhoids, small.  Digital exam was negative.  A video pediatric  adjustable colonoscope was inserted and easily advanced around the colon to  the cecum.  On insertion to the mid transverse, a 3 to 4 mm polyp was seen,  snared, electrocautery applied.  The polyp was removed, suctioned through  the scope and collected in the trap.  No other abnormalities were seen as we  advanced to the cecum, which was identified by the appendiceal orifice and  the ileocecal valve.  In fact, the scope was inserted a short ways into the  terminal ileum which was normal.  Photodocumentation was obtained.  The  scope was slowly withdrawn.  In the ascending, a small sessile polyp was  seen which was snared, electrocautery applied and the polyp was suctioned  through the scope and collected in the trap.  Three other additional right-  sided polyps were seen including the hepatic flexure and proximal transverse  which were all snared, electrocautery applied and the polyp was suctioned  through the scope and collected in the trap.  On one of them, we  had cut  through the polyp before we applied cautery and the base was hot biopsied  times one.  As the scope was withdrawn around the transverse in the proximal  level of the splenic flexure and descending, three sessile polyps were seen  and multiple hot biopsies of all three were obtained and put in a second  container.  The scope was further withdrawn.  In the midsigmoid, a small  polyp was seen and was hot biopsied and in the rectum, a small polyp was  seen snared.  Electrocautery  was applied and the polyp was suctioned  through the scope and collected in the trap.  The sigmoid and rectal polyp  were put in the third container.  The scope was retroflexed, revealing some  internal hemorrhoids.  Scope was straightened, air was suctioned, scope  removed.  The patient tolerated the procedure well  without obvious immediate  complication.   ENDOSCOPIC DIAGNOSES:  1. Small internal and external hemorrhoids.  2. Rectal small polyp snared.  Distal sigmoid polyp hot biopsied.  3. Three sessile descending and splenic flexure polyps hot biopsied.  4. Five small, right-sided polyps all snared with one hot biopsied at the     base.  5. Otherwise within normal limits to the terminal ileum.    PLAN:  Await pathology to determine future colonic screening.  Hold the  Coumadin for one week but okay to begin on Sunday.  One week low residue  diet.  Happy to see back p.r.n. or in two to three months to recheck  symptoms and decide any other work-up and plans.  Quite possibly a  gallbladder ultrasound or upper GI small bowel follow-through.                                                  Jeryl Columbia, M.D.    MEM/MEDQ  D:  02/20/2002  T:  02/20/2002  Job:  [id number]   cc:   Lindwood Qua, M.D.   Fransico Him, MD  [address] [place]  [place], [place] [address]  Fax: [phone]

## 2011-01-14 ENCOUNTER — Ambulatory Visit (HOSPITAL_COMMUNITY): Admission: RE | Admit: 2011-01-14 | Payer: BLUE CROSS/BLUE SHIELD | Source: Ambulatory Visit

## 2011-01-14 ENCOUNTER — Encounter (HOSPITAL_COMMUNITY)
Admission: RE | Admit: 2011-01-14 | Discharge: 2011-01-14 | Disposition: A | Discharge status: Home / Self Care | Payer: Medicare Other | Source: Ambulatory Visit | Attending: Hematology & Oncology | Admitting: Hematology & Oncology

## 2011-01-14 DIAGNOSIS — C50919 Malignant neoplasm of unspecified site of unspecified female breast: Secondary | ICD-10-CM | POA: Insufficient documentation

## 2011-01-14 DIAGNOSIS — C801 Malignant (primary) neoplasm, unspecified: Secondary | ICD-10-CM | POA: Insufficient documentation

## 2011-01-14 MED ORDER — TECHNETIUM TC 99M MEDRONATE IV KIT
24.0000 | PACK | Freq: Once | INTRAVENOUS | Status: AC | PRN
  Administered 2011-01-14: 24 via INTRAVENOUS

## 2011-01-21 ENCOUNTER — Encounter (HOSPITAL_BASED_OUTPATIENT_CLINIC_OR_DEPARTMENT_OTHER): Payer: Medicare Other | Admitting: Hematology & Oncology

## 2011-01-21 ENCOUNTER — Other Ambulatory Visit: Payer: Self-pay | Admitting: Hematology & Oncology

## 2011-01-21 DIAGNOSIS — C7951 Secondary malignant neoplasm of bone: Secondary | ICD-10-CM

## 2011-01-21 DIAGNOSIS — C50919 Malignant neoplasm of unspecified site of unspecified female breast: Secondary | ICD-10-CM

## 2011-01-21 DIAGNOSIS — Z23 Encounter for immunization: Secondary | ICD-10-CM

## 2011-01-21 LAB — CBC WITH DIFFERENTIAL (CANCER CENTER ONLY)
BASO#: 0 10*3/uL (ref 0.0–0.2)
BASO%: 0.3 % (ref 0.0–2.0)
EOS%: 5.3 % (ref 0.0–7.0)
Eosinophils Absolute: 0.2 10*3/uL (ref 0.0–0.5)
HCT: 35.6 % (ref 34.8–46.6)
HGB: 12.4 g/dL (ref 11.6–15.9)
LYMPH#: 1.4 10*3/uL (ref 0.9–3.3)
LYMPH%: 37.6 % (ref 14.0–48.0)
MCH: 29 pg (ref 26.0–34.0)
MCHC: 34.8 g/dL (ref 32.0–36.0)
MCV: 83 fL (ref 81–101)
MONO#: 0.4 10*3/uL (ref 0.1–0.9)
MONO%: 10.1 % (ref 0.0–13.0)
NEUT#: 1.8 10*3/uL (ref 1.5–6.5)
NEUT%: 46.7 % (ref 39.6–80.0)
Platelets: 161 10*3/uL (ref 145–400)
RBC: 4.27 10*6/uL (ref 3.70–5.32)
RDW: 13.9 % (ref 11.1–15.7)
WBC: 3.8 10*3/uL — ABNORMAL LOW (ref 3.9–10.0)

## 2011-01-22 LAB — COMPREHENSIVE METABOLIC PANEL
ALT: 9 U/L (ref 0–35)
AST: 15 U/L (ref 0–37)
Albumin: 3.7 g/dL (ref 3.5–5.2)
Alkaline Phosphatase: 48 U/L (ref 39–117)
BUN: 15 mg/dL (ref 6–23)
CO2: 26 mEq/L (ref 19–32)
Calcium: 9 mg/dL (ref 8.4–10.5)
Chloride: 110 mEq/L (ref 96–112)
Creatinine, Ser: 0.84 mg/dL (ref 0.50–1.10)
Glucose, Bld: 72 mg/dL (ref 70–99)
Potassium: 4.4 mEq/L (ref 3.5–5.3)
Sodium: 140 mEq/L (ref 135–145)
Total Bilirubin: 1 mg/dL (ref 0.3–1.2)
Total Protein: 6.5 g/dL (ref 6.0–8.3)

## 2011-01-22 LAB — VITAMIN D 25 HYDROXY (VIT D DEFICIENCY, FRACTURES): Vit D, 25-Hydroxy: 38 ng/mL (ref 30–89)

## 2011-01-22 LAB — CANCER ANTIGEN 27.29: CA 27.29: 30 U/mL (ref 0–39)

## 2011-02-06 ENCOUNTER — Emergency Department (HOSPITAL_COMMUNITY)
Admission: EM | Admit: 2011-02-06 | Discharge: 2011-02-06 | Disposition: A | Discharge status: Home / Self Care | Payer: Medicare Other | Attending: Emergency Medicine | Admitting: Emergency Medicine

## 2011-02-06 DIAGNOSIS — E0590 Thyrotoxicosis, unspecified without thyrotoxic crisis or storm: Secondary | ICD-10-CM | POA: Insufficient documentation

## 2011-02-06 DIAGNOSIS — I10 Essential (primary) hypertension: Secondary | ICD-10-CM | POA: Insufficient documentation

## 2011-02-06 DIAGNOSIS — M542 Cervicalgia: Secondary | ICD-10-CM | POA: Insufficient documentation

## 2011-02-06 DIAGNOSIS — H538 Other visual disturbances: Secondary | ICD-10-CM | POA: Insufficient documentation

## 2011-02-06 DIAGNOSIS — R51 Headache: Secondary | ICD-10-CM | POA: Insufficient documentation

## 2011-02-06 DIAGNOSIS — Z7901 Long term (current) use of anticoagulants: Secondary | ICD-10-CM | POA: Insufficient documentation

## 2011-02-06 DIAGNOSIS — Z853 Personal history of malignant neoplasm of breast: Secondary | ICD-10-CM | POA: Insufficient documentation

## 2011-02-06 DIAGNOSIS — I4891 Unspecified atrial fibrillation: Secondary | ICD-10-CM | POA: Insufficient documentation

## 2011-02-06 DIAGNOSIS — Z79899 Other long term (current) drug therapy: Secondary | ICD-10-CM | POA: Insufficient documentation

## 2011-02-06 LAB — DIFFERENTIAL
Basophils Absolute: 0 10*3/uL (ref 0.0–0.1)
Basophils Relative: 1 % (ref 0–1)
Eosinophils Absolute: 0.2 10*3/uL (ref 0.0–0.7)
Eosinophils Relative: 3 % (ref 0–5)
Lymphocytes Relative: 36 % (ref 12–46)
Lymphs Abs: 1.9 10*3/uL (ref 0.7–4.0)
Monocytes Absolute: 0.4 10*3/uL (ref 0.1–1.0)
Monocytes Relative: 8 % (ref 3–12)
Neutro Abs: 2.7 10*3/uL (ref 1.7–7.7)
Neutrophils Relative %: 52 % (ref 43–77)

## 2011-02-06 LAB — CBC
HCT: 36.4 % (ref 36.0–46.0)
Hemoglobin: 12.4 g/dL (ref 12.0–15.0)
MCH: 28.3 pg (ref 26.0–34.0)
MCHC: 34.1 g/dL (ref 30.0–36.0)
MCV: 83.1 fL (ref 78.0–100.0)
Platelets: 174 10*3/uL (ref 150–400)
RBC: 4.38 MIL/uL (ref 3.87–5.11)
RDW: 14 % (ref 11.5–15.5)
WBC: 5.1 10*3/uL (ref 4.0–10.5)

## 2011-02-06 LAB — BASIC METABOLIC PANEL
BUN: 15 mg/dL (ref 6–23)
CO2: 29 mEq/L (ref 19–32)
Calcium: 9.2 mg/dL (ref 8.4–10.5)
Chloride: 105 mEq/L (ref 96–112)
Creatinine, Ser: 0.82 mg/dL (ref 0.50–1.10)
GFR calc Af Amer: >60 mL/min (ref >60)
GFR calc non Af Amer: >60 mL/min (ref >60)
Glucose, Bld: 96 mg/dL (ref 70–99)
Potassium: 4.7 mEq/L (ref 3.5–5.1)
Sodium: 138 mEq/L (ref 135–145)

## 2011-02-06 LAB — SEDIMENTATION RATE: Sed Rate: 5 mm/hr (ref 0–22)

## 2011-02-06 LAB — PROTIME-INR
INR: 2.29 — ABNORMAL HIGH (ref 0.00–1.49)
Prothrombin Time: 25.6 seconds — ABNORMAL HIGH (ref 11.6–15.2)

## 2011-02-10 ENCOUNTER — Other Ambulatory Visit: Payer: Self-pay | Admitting: Family Medicine

## 2011-02-10 ENCOUNTER — Ambulatory Visit
Admission: RE | Admit: 2011-02-10 | Discharge: 2011-02-10 | Disposition: A | Discharge status: Home / Self Care | Payer: Medicare Other | Source: Ambulatory Visit | Attending: Family Medicine | Admitting: Family Medicine

## 2011-02-10 DIAGNOSIS — M5410 Radiculopathy, site unspecified: Secondary | ICD-10-CM

## 2011-03-03 LAB — PROTIME-INR
INR: 1.9 — ABNORMAL HIGH
Prothrombin Time: 21.9 — ABNORMAL HIGH

## 2011-03-04 ENCOUNTER — Emergency Department (HOSPITAL_COMMUNITY)
Admission: EM | Admit: 2011-03-04 | Discharge: 2011-03-04 | Disposition: A | Discharge status: Home / Self Care | Payer: Medicare Other | Attending: Emergency Medicine | Admitting: Emergency Medicine

## 2011-03-04 ENCOUNTER — Emergency Department (HOSPITAL_COMMUNITY): Payer: Medicare Other

## 2011-03-04 DIAGNOSIS — I10 Essential (primary) hypertension: Secondary | ICD-10-CM | POA: Insufficient documentation

## 2011-03-04 DIAGNOSIS — R079 Chest pain, unspecified: Secondary | ICD-10-CM | POA: Insufficient documentation

## 2011-03-04 DIAGNOSIS — M25569 Pain in unspecified knee: Secondary | ICD-10-CM | POA: Insufficient documentation

## 2011-03-04 DIAGNOSIS — E0590 Thyrotoxicosis, unspecified without thyrotoxic crisis or storm: Secondary | ICD-10-CM | POA: Insufficient documentation

## 2011-03-04 DIAGNOSIS — Z853 Personal history of malignant neoplasm of breast: Secondary | ICD-10-CM | POA: Insufficient documentation

## 2011-03-04 DIAGNOSIS — I4891 Unspecified atrial fibrillation: Secondary | ICD-10-CM | POA: Insufficient documentation

## 2011-03-04 LAB — POCT I-STAT, CHEM 8
BUN: 18 mg/dL (ref 6–23)
Calcium, Ion: 1.2 mmol/L (ref 1.12–1.32)
Chloride: 109 mEq/L (ref 96–112)
Creatinine, Ser: 1.1 mg/dL (ref 0.50–1.10)
Glucose, Bld: 140 mg/dL — ABNORMAL HIGH (ref 70–99)
HCT: 39 % (ref 36.0–46.0)
Hemoglobin: 13.3 g/dL (ref 12.0–15.0)
Potassium: 4.1 mEq/L (ref 3.5–5.1)
Sodium: 142 mEq/L (ref 135–145)
TCO2: 23 mmol/L (ref 0–100)

## 2011-03-04 LAB — PROTIME-INR
INR: 2.38 — ABNORMAL HIGH (ref 0.00–1.49)
Prothrombin Time: 26.4 seconds — ABNORMAL HIGH (ref 11.6–15.2)

## 2011-03-06 ENCOUNTER — Inpatient Hospital Stay (INDEPENDENT_AMBULATORY_CARE_PROVIDER_SITE_OTHER)
Admission: RE | Admit: 2011-03-06 | Discharge: 2011-03-06 | Disposition: A | Discharge status: Home / Self Care | Payer: Self-pay | Source: Ambulatory Visit | Attending: Family Medicine | Admitting: Family Medicine

## 2011-03-06 DIAGNOSIS — S20219A Contusion of unspecified front wall of thorax, initial encounter: Secondary | ICD-10-CM

## 2011-03-11 LAB — COMPREHENSIVE METABOLIC PANEL
ALT: 13
AST: 19
Albumin: 3.6
Alkaline Phosphatase: 42
BUN: 14
CO2: 24
Calcium: 9.2
Chloride: 110
Creatinine, Ser: 0.83
GFR calc Af Amer: >60
GFR calc non Af Amer: >60
Glucose, Bld: 84
Potassium: 3.8
Sodium: 140
Total Bilirubin: 1.5 — ABNORMAL HIGH
Total Protein: 6.1

## 2011-03-11 LAB — TSH: TSH: 1.802

## 2011-03-11 LAB — CK TOTAL AND CKMB (NOT AT ARMC)
CK, MB: 0.8
CK, MB: 0.8
CK, MB: 0.9
Relative Index: INVALID
Relative Index: INVALID
Relative Index: INVALID
Total CK: 48
Total CK: 56
Total CK: 62

## 2011-03-11 LAB — BASIC METABOLIC PANEL
BUN: 12
BUN: 13
CO2: 23
CO2: 27
Calcium: 8.9
Calcium: 9
Chloride: 106
Chloride: 109
Creatinine, Ser: 0.87
Creatinine, Ser: 0.88
GFR calc Af Amer: >60
GFR calc Af Amer: >60
GFR calc non Af Amer: >60
GFR calc non Af Amer: >60
Glucose, Bld: 82
Glucose, Bld: 90
Potassium: 3.6
Potassium: 3.8
Sodium: 137
Sodium: 142

## 2011-03-11 LAB — DIFFERENTIAL
Basophils Absolute: 0
Basophils Relative: 1
Eosinophils Absolute: 0.1
Eosinophils Relative: 1
Lymphocytes Relative: 33
Lymphs Abs: 1.3
Monocytes Absolute: 0.3
Monocytes Relative: 8
Neutro Abs: 2.2
Neutrophils Relative %: 57

## 2011-03-11 LAB — CBC
HCT: 36.2
HCT: 37.7
Hemoglobin: 12.3
Hemoglobin: 12.8
MCHC: 33.9
MCHC: 33.9
MCV: 85.3
MCV: 86.2
Platelets: 188
Platelets: 206
RBC: 4.21
RBC: 4.41
RDW: 14.4
RDW: 14.8
WBC: 3.9 — ABNORMAL LOW
WBC: 5.3

## 2011-03-11 LAB — LIPID PANEL
Cholesterol: 213 — ABNORMAL HIGH
HDL: 55
LDL Cholesterol: 147 — ABNORMAL HIGH
Total CHOL/HDL Ratio: 3.9
Triglycerides: 53
VLDL: 11

## 2011-03-11 LAB — TROPONIN I
Troponin I: 0.01
Troponin I: 0.01
Troponin I: <0.01

## 2011-03-11 LAB — D-DIMER, QUANTITATIVE: D-Dimer, Quant: 0.22

## 2011-03-11 LAB — PROTIME-INR
INR: 2.4 — ABNORMAL HIGH
Prothrombin Time: 27.7 — ABNORMAL HIGH

## 2011-03-11 LAB — MAGNESIUM: Magnesium: 2.2

## 2011-03-11 LAB — APTT: aPTT: 30

## 2011-03-17 ENCOUNTER — Ambulatory Visit
Admission: RE | Admit: 2011-03-17 | Discharge: 2011-03-17 | Disposition: A | Discharge status: Home / Self Care | Payer: Medicare Other | Source: Ambulatory Visit | Attending: Family Medicine | Admitting: Family Medicine

## 2011-03-17 ENCOUNTER — Other Ambulatory Visit: Payer: Self-pay | Admitting: Family Medicine

## 2011-04-10 ENCOUNTER — Emergency Department (HOSPITAL_COMMUNITY): Payer: Medicare Other

## 2011-04-10 ENCOUNTER — Emergency Department (HOSPITAL_COMMUNITY)
Admission: EM | Admit: 2011-04-10 | Discharge: 2011-04-10 | Disposition: A | Discharge status: Home / Self Care | Payer: Medicare Other | Attending: Emergency Medicine | Admitting: Emergency Medicine

## 2011-04-10 DIAGNOSIS — E0590 Thyrotoxicosis, unspecified without thyrotoxic crisis or storm: Secondary | ICD-10-CM | POA: Insufficient documentation

## 2011-04-10 DIAGNOSIS — Z79899 Other long term (current) drug therapy: Secondary | ICD-10-CM | POA: Insufficient documentation

## 2011-04-10 DIAGNOSIS — Z85830 Personal history of malignant neoplasm of bone: Secondary | ICD-10-CM | POA: Insufficient documentation

## 2011-04-10 DIAGNOSIS — Z7901 Long term (current) use of anticoagulants: Secondary | ICD-10-CM | POA: Insufficient documentation

## 2011-04-10 DIAGNOSIS — Z853 Personal history of malignant neoplasm of breast: Secondary | ICD-10-CM | POA: Insufficient documentation

## 2011-04-10 DIAGNOSIS — I10 Essential (primary) hypertension: Secondary | ICD-10-CM | POA: Insufficient documentation

## 2011-04-10 DIAGNOSIS — R1031 Right lower quadrant pain: Secondary | ICD-10-CM | POA: Insufficient documentation

## 2011-04-10 DIAGNOSIS — I4891 Unspecified atrial fibrillation: Secondary | ICD-10-CM | POA: Insufficient documentation

## 2011-04-10 LAB — DIFFERENTIAL
Basophils Absolute: 0 10*3/uL (ref 0.0–0.1)
Basophils Relative: 0 % (ref 0–1)
Eosinophils Absolute: 0.1 10*3/uL (ref 0.0–0.7)
Eosinophils Relative: 3 % (ref 0–5)
Lymphocytes Relative: 33 % (ref 12–46)
Lymphs Abs: 1.5 10*3/uL (ref 0.7–4.0)
Monocytes Absolute: 0.4 10*3/uL (ref 0.1–1.0)
Monocytes Relative: 8 % (ref 3–12)
Neutro Abs: 2.6 10*3/uL (ref 1.7–7.7)
Neutrophils Relative %: 56 % (ref 43–77)

## 2011-04-10 LAB — URINALYSIS, ROUTINE W REFLEX MICROSCOPIC
Bilirubin Urine: NEGATIVE
Glucose, UA: NEGATIVE mg/dL
Ketones, ur: NEGATIVE mg/dL
Leukocytes, UA: NEGATIVE
Nitrite: NEGATIVE
Protein, ur: NEGATIVE mg/dL
Specific Gravity, Urine: 1.009 (ref 1.005–1.030)
Urobilinogen, UA: 0.2 mg/dL (ref 0.0–1.0)
pH: 6.5 (ref 5.0–8.0)

## 2011-04-10 LAB — COMPREHENSIVE METABOLIC PANEL
ALT: 15 U/L (ref 0–35)
AST: 18 U/L (ref 0–37)
Albumin: 4.1 g/dL (ref 3.5–5.2)
Alkaline Phosphatase: 70 U/L (ref 39–117)
BUN: 14 mg/dL (ref 6–23)
CO2: 28 mEq/L (ref 19–32)
Calcium: 9.9 mg/dL (ref 8.4–10.5)
Chloride: 105 mEq/L (ref 96–112)
Creatinine, Ser: 0.89 mg/dL (ref 0.50–1.10)
GFR calc Af Amer: 68 mL/min — ABNORMAL LOW (ref >90)
GFR calc non Af Amer: 58 mL/min — ABNORMAL LOW (ref >90)
Glucose, Bld: 100 mg/dL — ABNORMAL HIGH (ref 70–99)
Potassium: 4.9 mEq/L (ref 3.5–5.1)
Sodium: 140 mEq/L (ref 135–145)
Total Bilirubin: 1.2 mg/dL (ref 0.3–1.2)
Total Protein: 7.4 g/dL (ref 6.0–8.3)

## 2011-04-10 LAB — CBC
HCT: 37.7 % (ref 36.0–46.0)
Hemoglobin: 12.9 g/dL (ref 12.0–15.0)
MCH: 28.7 pg (ref 26.0–34.0)
MCHC: 34.2 g/dL (ref 30.0–36.0)
MCV: 83.8 fL (ref 78.0–100.0)
Platelets: 174 10*3/uL (ref 150–400)
RBC: 4.5 MIL/uL (ref 3.87–5.11)
RDW: 13.9 % (ref 11.5–15.5)
WBC: 4.6 10*3/uL (ref 4.0–10.5)

## 2011-04-10 LAB — URINE MICROSCOPIC-ADD ON

## 2011-04-10 LAB — TYPE AND SCREEN
ABO/RH(D): O NEG
Antibody Screen: NEGATIVE

## 2011-04-10 LAB — PROTIME-INR
INR: 2.27 — ABNORMAL HIGH (ref 0.00–1.49)
Prothrombin Time: 25.4 seconds — ABNORMAL HIGH (ref 11.6–15.2)

## 2011-04-10 LAB — ABO/RH: ABO/RH(D): O NEG

## 2011-04-10 MED ORDER — IOHEXOL 300 MG/ML  SOLN
80.0000 mL | Freq: Once | INTRAMUSCULAR | Status: AC | PRN
  Administered 2011-04-10: 80 mL via INTRAVENOUS

## 2011-05-13 ENCOUNTER — Ambulatory Visit: Payer: Medicare Other

## 2011-05-13 ENCOUNTER — Ambulatory Visit (HOSPITAL_BASED_OUTPATIENT_CLINIC_OR_DEPARTMENT_OTHER): Payer: Medicare Other | Admitting: Hematology & Oncology

## 2011-05-13 ENCOUNTER — Other Ambulatory Visit: Payer: Self-pay | Admitting: Hematology & Oncology

## 2011-05-13 ENCOUNTER — Encounter: Payer: Self-pay | Admitting: Hematology & Oncology

## 2011-05-13 ENCOUNTER — Other Ambulatory Visit (HOSPITAL_BASED_OUTPATIENT_CLINIC_OR_DEPARTMENT_OTHER): Payer: Medicare Other | Admitting: Lab

## 2011-05-13 VITALS — BP 130/70 | HR 61 | Temp 97.4°F | Ht 65.5 in | Wt 176.0 lb

## 2011-05-13 DIAGNOSIS — C50919 Malignant neoplasm of unspecified site of unspecified female breast: Secondary | ICD-10-CM

## 2011-05-13 DIAGNOSIS — C7951 Secondary malignant neoplasm of bone: Secondary | ICD-10-CM

## 2011-05-13 DIAGNOSIS — C7952 Secondary malignant neoplasm of bone marrow: Secondary | ICD-10-CM

## 2011-05-13 HISTORY — DX: Malignant neoplasm of unspecified site of unspecified female breast: C50.919

## 2011-05-13 LAB — COMPREHENSIVE METABOLIC PANEL
ALT: 9 U/L (ref 0–35)
ALT: 9 U/L (ref 0–35)
AST: 17 U/L (ref 0–37)
AST: 17 U/L (ref 0–37)
Albumin: 4 g/dL (ref 3.5–5.2)
Albumin: 4 g/dL (ref 3.5–5.2)
Alkaline Phosphatase: 50 U/L (ref 39–117)
Alkaline Phosphatase: 50 U/L (ref 39–117)
BUN: 13 mg/dL (ref 6–23)
BUN: 13 mg/dL (ref 6–23)
CO2: 24 mEq/L (ref 19–32)
CO2: 24 mEq/L (ref 19–32)
Calcium: 8.9 mg/dL (ref 8.4–10.5)
Calcium: 8.9 mg/dL (ref 8.4–10.5)
Chloride: 108 mEq/L (ref 96–112)
Chloride: 108 mEq/L (ref 96–112)
Creatinine, Ser: 0.82 mg/dL (ref 0.50–1.10)
Creatinine, Ser: 0.82 mg/dL (ref 0.50–1.10)
Glucose, Bld: 76 mg/dL (ref 70–99)
Glucose, Bld: 76 mg/dL (ref 70–99)
Potassium: 4.1 mEq/L (ref 3.5–5.3)
Potassium: 4.1 mEq/L (ref 3.5–5.3)
Sodium: 143 mEq/L (ref 135–145)
Sodium: 143 mEq/L (ref 135–145)
Total Bilirubin: 1 mg/dL (ref 0.3–1.2)
Total Bilirubin: 1 mg/dL (ref 0.3–1.2)
Total Protein: 6.5 g/dL (ref 6.0–8.3)
Total Protein: 6.5 g/dL (ref 6.0–8.3)

## 2011-05-13 LAB — CBC WITH DIFFERENTIAL (CANCER CENTER ONLY)
BASO#: 0 10*3/uL (ref 0.0–0.2)
BASO%: 0.4 % (ref 0.0–2.0)
EOS%: 3.2 % (ref 0.0–7.0)
Eosinophils Absolute: 0.2 10*3/uL (ref 0.0–0.5)
HCT: 37 % (ref 34.8–46.6)
HGB: 12.6 g/dL (ref 11.6–15.9)
LYMPH#: 1.5 10*3/uL (ref 0.9–3.3)
LYMPH%: 31.7 % (ref 14.0–48.0)
MCH: 28.4 pg (ref 26.0–34.0)
MCHC: 34.1 g/dL (ref 32.0–36.0)
MCV: 83 fL (ref 81–101)
MONO#: 0.4 10*3/uL (ref 0.1–0.9)
MONO%: 9.5 % (ref 0.0–13.0)
NEUT#: 2.6 10*3/uL (ref 1.5–6.5)
NEUT%: 55.2 % (ref 39.6–80.0)
Platelets: 206 10*3/uL (ref 145–400)
RBC: 4.44 10*6/uL (ref 3.70–5.32)
RDW: 13.7 % (ref 11.1–15.7)
WBC: 4.6 10*3/uL (ref 3.9–10.0)

## 2011-05-13 LAB — CANCER ANTIGEN 27.29: CA 27.29: 35 U/mL (ref 0–39)

## 2011-05-13 NOTE — Progress Notes (Signed)
CC:   Mayra Neer, M.D. Sanda Klein, MD  DIAGNOSIS:  Metastatic breast cancer, bone metastases.  CURRENT THERAPY: 1. Femara 2.5 mg p.o. daily. 2. Zometa 4 mg IV every 3 months.  INTERIM HISTORY:  Ms. Janikowski comes in for followup.  She is not doing as well as she would like.  She apparently is on Crestor.  She says that a lot of her muscles hurt.  I told her that she really needs to get off the Crestor.  I told her that it can lead to muscle inflammation which ultimately can affect her kidneys.  She does have rheumatoid arthritis.  This has not been too much of a problem for her.  Her last CA 27.29 back in August was steady at 30.  She is not complaining of any new bony pains.  Her appetite is okay.  There is no nausea or vomiting.  She actually gained some weight.  PHYSICAL EXAM:  General:  This is a well-developed, well-nourished white female in no obvious distress.  Vital Signs:  Show temperature of 97.4, pulse 61, respiratory rate 18, blood pressure 130/70.  Weight is 176. Head/Neck:  Exam shows a normocephalic, atraumatic skull.  There are no ocular or oral lesions.  There are no palpable cervical or supraclavicular lymph nodes.  Lungs:  Clear bilaterally.  Cardiac: Regular rate and rhythm with a normal S1, S2.  There are no murmurs, rubs or bruits.  Abdomen:  Soft with good bowel sounds.  There is no palpable abdominal mass.  There is no palpable hepatosplenomegaly. Back:  Exam shows no kyphosis.  There is no tenderness over the spine, ribs, or hips.  Extremities:  Changes consistent with rheumatoid arthritic arthritis/osteoarthritis.  She has decent range of motion of her joints.  Skin:  Exam shows no rashes, ecchymosis or petechiae.  LABORATORY STUDIES:  White cell count is 4.6, hemoglobin 12.6, hematocrit 37, platelet count 206.  IMPRESSION/PLAN:  Ms. Grenda is an 77 year old white female with metastatic breast cancer.  We have been following her now for  several years.  We have been seeing her for over 5 years.  So far, her bone metastases really have not progressed.  We will go ahead and plan for a followup bone scan on her next year.  We will then plan to see her back afterwards.  She wants to hold off on Zometa for right now.  I think that is okay. We will need to get her Zometa when we see her back with her next visit.    ______________________________ Volanda Napoleon, M.D. PRE/MEDQ  D:  05/13/2011  T:  05/13/2011  Job:  [id number]  ADDENDUM:  CA 27.29 is 35.

## 2011-05-13 NOTE — Progress Notes (Signed)
This office note has been dictated.

## 2011-05-13 NOTE — Progress Notes (Signed)
Mrs. Piercefield requests no chemo today d/t feeling poorly.  Seen by Dr. Marin Olp. Edwinna Areola, Kingstin Heims [employer]

## 2011-05-14 ENCOUNTER — Telehealth: Payer: Self-pay | Admitting: *Deleted

## 2011-05-14 LAB — COMPREHENSIVE METABOLIC PANEL
ALT: 9 U/L (ref 0–35)
AST: 17 U/L (ref 0–37)
Albumin: 4 g/dL (ref 3.5–5.2)
Alkaline Phosphatase: 50 U/L (ref 39–117)
BUN: 13 mg/dL (ref 6–23)
CO2: 24 mEq/L (ref 19–32)
Calcium: 8.9 mg/dL (ref 8.4–10.5)
Chloride: 108 mEq/L (ref 96–112)
Creatinine, Ser: 0.82 mg/dL (ref 0.50–1.10)
Glucose, Bld: 76 mg/dL (ref 70–99)
Potassium: 4.1 mEq/L (ref 3.5–5.3)
Sodium: 143 mEq/L (ref 135–145)
Total Bilirubin: 1 mg/dL (ref 0.3–1.2)
Total Protein: 6.5 g/dL (ref 6.0–8.3)

## 2011-05-14 LAB — CANCER ANTIGEN 27.29: CA 27.29: 35 U/mL (ref 0–39)

## 2011-05-14 LAB — VITAMIN D 25 HYDROXY (VIT D DEFICIENCY, FRACTURES): Vit D, 25-Hydroxy: 56 ng/mL (ref 30–89)

## 2011-05-14 NOTE — Telephone Encounter (Signed)
Mailed pt's 07-14-11 bone scan and 08-07-11 MD schedule's

## 2011-07-14 ENCOUNTER — Encounter (HOSPITAL_COMMUNITY): Payer: Self-pay

## 2011-07-14 ENCOUNTER — Encounter (HOSPITAL_COMMUNITY)
Admission: RE | Admit: 2011-07-14 | Discharge: 2011-07-14 | Disposition: A | Discharge status: Home / Self Care | Payer: Medicare Other | Source: Ambulatory Visit | Attending: Hematology & Oncology | Admitting: Hematology & Oncology

## 2011-07-14 DIAGNOSIS — C7951 Secondary malignant neoplasm of bone: Secondary | ICD-10-CM | POA: Insufficient documentation

## 2011-07-14 DIAGNOSIS — C7952 Secondary malignant neoplasm of bone marrow: Secondary | ICD-10-CM | POA: Insufficient documentation

## 2011-07-14 DIAGNOSIS — C50919 Malignant neoplasm of unspecified site of unspecified female breast: Secondary | ICD-10-CM | POA: Insufficient documentation

## 2011-07-14 MED ORDER — TECHNETIUM TC 99M MEDRONATE IV KIT
25.0000 | PACK | Freq: Once | INTRAVENOUS | Status: AC | PRN
  Administered 2011-07-14: 25 via INTRAVENOUS

## 2011-07-17 ENCOUNTER — Encounter: Payer: Self-pay | Admitting: *Deleted

## 2011-07-17 NOTE — Progress Notes (Signed)
Call her and tell her that the bone scan is still stable w/ no evidence of cancer groweth. pete Pt called.  Voiced understanding.Call her and tell her that the bone scan is still stable w/ no evidence of cancer groweth. pete

## 2011-07-20 ENCOUNTER — Telehealth: Payer: Self-pay | Admitting: *Deleted

## 2011-07-20 NOTE — Telephone Encounter (Signed)
Called patients personal voice mail to let her know that her bone scan is still stable with no evidence of cancer growth.

## 2011-07-20 NOTE — Telephone Encounter (Signed)
Message copied by Rico Ala on Mon Jul 20, 2011 12:11 PM ------      Message from: Burney Gauze R      Created: Thu Jul 16, 2011  6:35 PM       Call her and tell her that the bone scan is still stable w/ no evidence of cancer groweth.  pete

## 2011-07-30 ENCOUNTER — Other Ambulatory Visit: Payer: Self-pay | Admitting: Hematology & Oncology

## 2011-08-07 ENCOUNTER — Ambulatory Visit (HOSPITAL_BASED_OUTPATIENT_CLINIC_OR_DEPARTMENT_OTHER): Payer: Medicare Other | Admitting: Lab

## 2011-08-07 ENCOUNTER — Ambulatory Visit (HOSPITAL_BASED_OUTPATIENT_CLINIC_OR_DEPARTMENT_OTHER): Payer: Medicare Other | Admitting: Hematology & Oncology

## 2011-08-07 DIAGNOSIS — M810 Age-related osteoporosis without current pathological fracture: Secondary | ICD-10-CM

## 2011-08-07 DIAGNOSIS — M2550 Pain in unspecified joint: Secondary | ICD-10-CM

## 2011-08-07 DIAGNOSIS — C7951 Secondary malignant neoplasm of bone: Secondary | ICD-10-CM

## 2011-08-07 DIAGNOSIS — C50919 Malignant neoplasm of unspecified site of unspecified female breast: Secondary | ICD-10-CM

## 2011-08-07 LAB — CBC WITH DIFFERENTIAL (CANCER CENTER ONLY)
BASO#: 0 10*3/uL (ref 0.0–0.2)
BASO%: 0.4 % (ref 0.0–2.0)
EOS%: 3 % (ref 0.0–7.0)
Eosinophils Absolute: 0.2 10*3/uL (ref 0.0–0.5)
HCT: 36.4 % (ref 34.8–46.6)
HGB: 12.3 g/dL (ref 11.6–15.9)
LYMPH#: 1.6 10*3/uL (ref 0.9–3.3)
LYMPH%: 32.4 % (ref 14.0–48.0)
MCH: 28 pg (ref 26.0–34.0)
MCHC: 33.8 g/dL (ref 32.0–36.0)
MCV: 83 fL (ref 81–101)
MONO#: 0.4 10*3/uL (ref 0.1–0.9)
MONO%: 8.6 % (ref 0.0–13.0)
NEUT#: 2.8 10*3/uL (ref 1.5–6.5)
NEUT%: 55.6 % (ref 39.6–80.0)
Platelets: 194 10*3/uL (ref 145–400)
RBC: 4.39 10*6/uL (ref 3.70–5.32)
RDW: 14.2 % (ref 11.1–15.7)
WBC: 5 10*3/uL (ref 3.9–10.0)

## 2011-08-07 LAB — COMPREHENSIVE METABOLIC PANEL
ALT: 10 U/L (ref 0–35)
AST: 13 U/L (ref 0–37)
Albumin: 3.7 g/dL (ref 3.5–5.2)
Alkaline Phosphatase: 49 U/L (ref 39–117)
BUN: 15 mg/dL (ref 6–23)
CO2: 27 mEq/L (ref 19–32)
Calcium: 8.8 mg/dL (ref 8.4–10.5)
Chloride: 107 mEq/L (ref 96–112)
Creatinine, Ser: 0.9 mg/dL (ref 0.50–1.10)
Glucose, Bld: 86 mg/dL (ref 70–99)
Potassium: 4.2 mEq/L (ref 3.5–5.3)
Sodium: 140 mEq/L (ref 135–145)
Total Bilirubin: 1.2 mg/dL (ref 0.3–1.2)
Total Protein: 6.5 g/dL (ref 6.0–8.3)

## 2011-08-07 LAB — CANCER ANTIGEN 27.29: CA 27.29: 30 U/mL (ref 0–39)

## 2011-08-07 NOTE — Progress Notes (Signed)
Dict

## 2011-08-08 NOTE — Progress Notes (Signed)
CC:   Mayra Neer, M.D. Sanda Klein, MD  DIAGNOSIS:  Metastatic breast cancer, bone only metastases.  CURRENT THERAPY: 1. Femara 2.5 mg p.o. daily. 2. Zometa 4 mg IV every 3 months.  INTERIM HISTORY:  Ms. Crissman comes in for follow-up.  She is doing okay.  She does have some generalized arthralgias.  I really do not think this is at all related to her breast cancer.  We did do a bone scan on her.  This was done back on February 5th.  This basically showed no evidence of progressive disease.  She has a stable uptake in her right 6th rib.  There is uptake in T12, which has been stable.  This has been biopsied in the past, which has been negative. There is some new uptake in the left 5th and 6th ribs, which appear to be traumatic.  Her last tumor marker was 35 back in December.  Her last vitamin D level was 56 back in December.  She says she does take vitamin D.  She has had a good appetite.  She is gaining weight.  She is not too happy about that.  She has had no change in bowel or bladder habits.  There has been no cough.  There have been no fevers, sweats or chills.  PHYSICAL EXAMINATION:  General Appearance:  This is an elderly white female in no obvious distress.  Vital Signs:  Show a temperature of 97.3, pulse 63, respiratory rate 18, blood pressure 130/73.  Weight is 174.  Head and Neck Exam:  Shows a normocephalic, atraumatic skull. There are no ocular or oral lesions.  There are no palpable cervical or supraclavicular lymph nodes.  Lungs:  Clear bilaterally.  Cardiac Exam: Regular rate and rhythm with a normal S1 and S2.  There are no murmurs, rubs or bruits.  Abdominal Exam:  Soft with good bowel sounds.  There is no fluid wave.  There is no palpable hepatosplenomegaly.  Back Exam: Shows some slight kyphosis.  There is no tenderness over the spine, ribs or hips.  Extremities:  Show some rheumatoid arthritis changes in her joints.  There is some slight pitting  edema of her lower legs.  LABORATORY STUDIES:  White cell count is 5, hemoglobin 12.3, hematocrit 36.4, platelet count 194.  IMPRESSION:  Ms. Kondo is an 78 year old white female with metastatic breast cancer.  We have been following her now for about 6 years.  Her disease has not progressed.  We have been very fortunate that it has not progressed.  She is doing great on Femara.  I think these arthralgias and myalgias might be related to her Crestor. She is still taking Crestor.  She will continue her Zometa.  Unfortunately, we cannot do the Zometa today, so we will have to get her back in a couple of weeks.  I do not see that we need to do any further scans on her probably for another 6 months or so.  Everything has been very stable for her.  I will plan to see her back in another 3 months.    ______________________________ Volanda Napoleon, M.D. PRE/MEDQ  D:  08/07/2011  T:  08/08/2011  Job:  [id number]

## 2011-08-10 ENCOUNTER — Telehealth: Payer: Self-pay | Admitting: Hematology & Oncology

## 2011-08-10 NOTE — Telephone Encounter (Signed)
Left pt message with 3-20 and 5-31 appointment times

## 2011-08-17 ENCOUNTER — Telehealth: Payer: Self-pay | Admitting: *Deleted

## 2011-08-17 NOTE — Telephone Encounter (Signed)
Message copied by Rico Ala on Mon Aug 17, 2011 11:30 AM ------      Message from: Volanda Napoleon      Created: Sun Aug 09, 2011  8:23 PM       Call - labs are ok. pete

## 2011-08-17 NOTE — Telephone Encounter (Signed)
Called patient to let her know that her labwork was ok per dr. Marin Olp.

## 2011-08-26 ENCOUNTER — Ambulatory Visit (HOSPITAL_BASED_OUTPATIENT_CLINIC_OR_DEPARTMENT_OTHER): Payer: Medicare Other

## 2011-08-26 VITALS — BP 126/84 | HR 72 | Temp 97.8°F

## 2011-08-26 DIAGNOSIS — C50919 Malignant neoplasm of unspecified site of unspecified female breast: Secondary | ICD-10-CM

## 2011-08-26 DIAGNOSIS — C7952 Secondary malignant neoplasm of bone marrow: Secondary | ICD-10-CM

## 2011-08-26 DIAGNOSIS — C7951 Secondary malignant neoplasm of bone: Secondary | ICD-10-CM

## 2011-08-26 MED ORDER — SODIUM CHLORIDE 0.9 % IV SOLN: Freq: Once | INTRAVENOUS | Status: DC

## 2011-08-26 MED ORDER — ZOLEDRONIC ACID 4 MG/100ML IV SOLN
4.0000 mg | Freq: Once | INTRAVENOUS | Status: AC
  Administered 2011-08-26: 4 mg via INTRAVENOUS
  Filled 2011-08-26: qty 100

## 2011-08-26 NOTE — Patient Instructions (Signed)
Zoledronic Acid injection (Hypercalcemia, Oncology) What is this medicine? ZOLEDRONIC ACID (ZOE le dron ik AS id) lowers the amount of calcium loss from bone. It is used to treat too much calcium in your blood from cancer. It is also used to prevent complications of cancer that has spread to the bone. This medicine may be used for other purposes; ask your health care provider or pharmacist if you have questions. What should I tell my health care provider before I take this medicine? They need to know if you have any of these conditions: -aspirin-sensitive asthma -dental disease -kidney disease -an unusual or allergic reaction to zoledronic acid, other medicines, foods, dyes, or preservatives -pregnant or trying to get pregnant -breast-feeding How should I use this medicine? This medicine is for infusion into a vein. It is given by a health care professional in a hospital or clinic setting. Talk to your pediatrician regarding the use of this medicine in children. Special care may be needed. Overdosage: If you think you have taken too much of this medicine contact a poison control center or emergency room at once. NOTE: This medicine is only for you. Do not share this medicine with others. What if I miss a dose? It is important not to miss your dose. Call your doctor or health care professional if you are unable to keep an appointment. What may interact with this medicine? -certain antibiotics given by injection -NSAIDs, medicines for pain and inflammation, like ibuprofen or naproxen -some diuretics like bumetanide, furosemide -teriparatide -thalidomide This list may not describe all possible interactions. Give your health care provider a list of all the medicines, herbs, non-prescription drugs, or dietary supplements you use. Also tell them if you smoke, drink alcohol, or use illegal drugs. Some items may interact with your medicine. What should I watch for while using this medicine? Visit  your doctor or health care professional for regular checkups. It may be some time before you see the benefit from this medicine. Do not stop taking your medicine unless your doctor tells you to. Your doctor may order blood tests or other tests to see how you are doing. Women should inform their doctor if they wish to become pregnant or think they might be pregnant. There is a potential for serious side effects to an unborn child. Talk to your health care professional or pharmacist for more information. You should make sure that you get enough calcium and vitamin D while you are taking this medicine. Discuss the foods you eat and the vitamins you take with your health care professional. Some people who take this medicine have severe bone, joint, and/or muscle pain. This medicine may also increase your risk for a broken thigh bone. Tell your doctor right away if you have pain in your upper leg or groin. Tell your doctor if you have any pain that does not go away or that gets worse. What side effects may I notice from receiving this medicine? Side effects that you should report to your doctor or health care professional as soon as possible: -allergic reactions like skin rash, itching or hives, swelling of the face, lips, or tongue -anxiety, confusion, or depression -breathing problems -changes in vision -feeling faint or lightheaded, falls -jaw burning, cramping, pain -muscle cramps, stiffness, or weakness -trouble passing urine or change in the amount of urine Side effects that usually do not require medical attention (report to your doctor or health care professional if they continue or are bothersome): -bone, joint, or muscle pain -  fever -hair loss -irritation at site where injected -loss of appetite -nausea, vomiting -stomach upset -tired This list may not describe all possible side effects. Call your doctor for medical advice about side effects. You may report side effects to FDA at  1-800-FDA-1088. Where should I keep my medicine? This drug is given in a hospital or clinic and will not be stored at home. NOTE: This sheet is a summary. It may not cover all possible information. If you have questions about this medicine, talk to your doctor, pharmacist, or health care provider.  2012, [employer]. (11/21/2010 9:06:58 AM)

## 2011-09-21 ENCOUNTER — Encounter (HOSPITAL_COMMUNITY): Payer: Self-pay

## 2011-09-21 ENCOUNTER — Emergency Department (INDEPENDENT_AMBULATORY_CARE_PROVIDER_SITE_OTHER)
Admission: EM | Admit: 2011-09-21 | Discharge: 2011-09-21 | Disposition: A | Discharge status: Home / Self Care | Payer: Medicare Other | Source: Home / Self Care

## 2011-09-21 DIAGNOSIS — R109 Unspecified abdominal pain: Secondary | ICD-10-CM

## 2011-09-21 HISTORY — DX: Unspecified atrial fibrillation: I48.91

## 2011-09-21 LAB — POCT URINALYSIS DIP (DEVICE)
Bilirubin Urine: NEGATIVE
Glucose, UA: NEGATIVE mg/dL
Ketones, ur: NEGATIVE mg/dL
Leukocytes, UA: NEGATIVE
Nitrite: NEGATIVE
Protein, ur: NEGATIVE mg/dL
Specific Gravity, Urine: 1.02 (ref 1.005–1.030)
Urobilinogen, UA: 0.2 mg/dL (ref 0.0–1.0)
pH: 6.5 (ref 5.0–8.0)

## 2011-09-21 NOTE — Discharge Instructions (Signed)
Thank you for coming in today. Please follow up with Dr. Brigitte Pulse ASAP.  If you pain gets worse or you start vomiting please go to the ER.  Abdominal Pain Abdominal pain can be caused by many things. Your caregiver decides the seriousness of your pain by an examination and possibly blood tests and X-rays. Many cases can be observed and treated at home. Most abdominal pain is not caused by a disease and will probably improve without treatment. However, in many cases, more time must pass before a clear cause of the pain can be found. Before that point, it may not be known if you need more testing, or if hospitalization or surgery is needed. HOME CARE INSTRUCTIONS   Do not take laxatives unless directed by your caregiver.   Take pain medicine only as directed by your caregiver.   Only take over-the-counter or prescription medicines for pain, discomfort, or fever as directed by your caregiver.   Try a clear liquid diet (broth, tea, or water) for as long as directed by your caregiver. Slowly move to a bland diet as tolerated.  SEEK IMMEDIATE MEDICAL CARE IF:   The pain does not go away.   You have a fever.   You keep throwing up (vomiting).   The pain is felt only in portions of the abdomen. Pain in the right side could possibly be appendicitis. In an adult, pain in the left lower portion of the abdomen could be colitis or diverticulitis.   You pass bloody or black tarry stools.  MAKE SURE YOU:   Understand these instructions.   Will watch your condition.   Will get help right away if you are not doing well or get worse.  Document Released: 03/04/2005 Document Revised: 05/14/2011 Document Reviewed: 01/11/2008 [facility] Patient Information 2012 [place].

## 2011-09-21 NOTE — ED Provider Notes (Signed)
Amy White is a 78 y.o. female who presents to Urgent Care today for epigastric abdominal pain present for about 24 hours. Pain is located in the mid epigastrium without radiation. Not worse with food deep breaths motion. Patient is able to go the bathroom and had flatus. She denies any vomiting. She did not sleep well last night due to the pain. She thinks that perhaps she may be able to see her doctor tomorrow morning and does not want to be admitted to the hospital if she can avoid it. No new medicines no medication changes.   PMH reviewed. Significant for stage IV breast cancer with metastasis to the bone and atrial fibrillation paroxysmal ROS as above otherwise neg.  no chest pains, palpitations, fevers, chills,  nausea or vomiting. Medications reviewed. No current facility-administered medications for this encounter.   Current Outpatient Prescriptions  Medication Sig Dispense Refill  . ALPRAZolam (XANAX) 0.25 MG tablet Take 0.5 mg by mouth at bedtime as needed.        Marland Kitchen letrozole (FEMARA) 2.5 MG tablet TAKE 1 TABLET EVERY DAY  30 tablet  3  . levothyroxine (SYNTHROID, LEVOTHROID) 75 MCG tablet Take 75 mg by mouth Daily.      . metoprolol succinate (TOPROL-XL) 25 MG 24 hr tablet Take 25 mg by mouth Daily.      Marland Kitchen warfarin (COUMADIN) 5 MG tablet Take 5 mg by mouth Daily.      . Zoledronic Acid (ZOMETA) 4 MG/100ML IVPB Inject 4 mg into the vein every 6 (six) weeks.      Marland Kitchen albuterol (PROVENTIL) (2.5 MG/3ML) 0.083% nebulizer solution Take 2.5 mg by nebulization every 6 (six) hours as needed.        . ENDOCET 5-325 MG per tablet as needed.      . Niacin (VITAMIN B-3 PO) Take 1,000 mg by mouth daily.        . rosuvastatin (CRESTOR) 10 MG tablet Take 10 mg by mouth daily.          Exam:  BP 182/77  Pulse 72  Temp(Src) 98 F (36.7 C) (Oral)  Resp 16  SpO2 96% Gen: Well NAD, nontoxic appearing HEENT: EOMI,  MMM Lungs: CTABL Nl WOB Heart: RRR no MRG Abd: NABS,  ND, no guarding or  rebound. Mildly tender in the midabdomen no masses palpated. Exts: Non edematous BL  LE, warm and well perfused.   Urinalysis significant only for moderate blood which is a routine finding for this patient EKG shows normal sinus rhythm at 64 beats per minute with no ST segment changes. Normal EKG  Assessment and Plan: 78 y.o. female with epigastric abdominal pain. I am not sure of the etiology of her pain. Obviously there is some concern for serious intra-abdominal pathology including metastatic disease.  However she appears well and her vital signs  certainly show no concern for sepsis. Her physical exam is not very remarkable.  I offered transfer to the emergency room for further evaluation including CT scan however she elects to followup with her primary care doctor tomorrow.  We discussed risks versus benefits and she expresses understanding. Discussed signs or symptoms of from her return to health care including worsening abdominal pain or vomiting.     Gregor Hams, MD 09/21/11 Madison Avrey Flanagin, MD 09/21/11 2107  Gregor Hams, MD 09/21/11 2109

## 2011-09-21 NOTE — ED Notes (Signed)
C/o epigastric pain since yesterday.  Denies vomiting, diarrhea or fever.  Denies urinary sx.  States she took Tums without relief.

## 2011-09-26 NOTE — ED Provider Notes (Signed)
Medical screening examination/treatment/procedure(s) were performed by resident physician or non-physician practitioner and as supervising physician I was immediately available for consultation/collaboration.   Pauline Good MD.    Billy Fischer, MD 09/26/11 [phone]

## 2011-11-06 ENCOUNTER — Other Ambulatory Visit (HOSPITAL_BASED_OUTPATIENT_CLINIC_OR_DEPARTMENT_OTHER): Payer: Medicare Other | Admitting: Lab

## 2011-11-06 ENCOUNTER — Ambulatory Visit (HOSPITAL_BASED_OUTPATIENT_CLINIC_OR_DEPARTMENT_OTHER): Payer: Medicare Other | Admitting: Hematology & Oncology

## 2011-11-06 ENCOUNTER — Ambulatory Visit (HOSPITAL_BASED_OUTPATIENT_CLINIC_OR_DEPARTMENT_OTHER): Payer: Medicare Other

## 2011-11-06 VITALS — BP 136/70 | HR 63 | Temp 97.6°F | Ht 65.0 in | Wt 174.0 lb

## 2011-11-06 DIAGNOSIS — C7951 Secondary malignant neoplasm of bone: Secondary | ICD-10-CM

## 2011-11-06 DIAGNOSIS — M818 Other osteoporosis without current pathological fracture: Secondary | ICD-10-CM

## 2011-11-06 DIAGNOSIS — C50919 Malignant neoplasm of unspecified site of unspecified female breast: Secondary | ICD-10-CM

## 2011-11-06 DIAGNOSIS — T386X5A Adverse effect of antigonadotrophins, antiestrogens, antiandrogens, not elsewhere classified, initial encounter: Secondary | ICD-10-CM

## 2011-11-06 DIAGNOSIS — C7952 Secondary malignant neoplasm of bone marrow: Secondary | ICD-10-CM

## 2011-11-06 DIAGNOSIS — M810 Age-related osteoporosis without current pathological fracture: Secondary | ICD-10-CM

## 2011-11-06 LAB — CBC WITH DIFFERENTIAL (CANCER CENTER ONLY)
BASO#: 0 10*3/uL (ref 0.0–0.2)
BASO%: 0.6 % (ref 0.0–2.0)
EOS%: 3.7 % (ref 0.0–7.0)
Eosinophils Absolute: 0.1 10*3/uL (ref 0.0–0.5)
HCT: 36 % (ref 34.8–46.6)
HGB: 12 g/dL (ref 11.6–15.9)
LYMPH#: 1.2 10*3/uL (ref 0.9–3.3)
LYMPH%: 34.6 % (ref 14.0–48.0)
MCH: 28.4 pg (ref 26.0–34.0)
MCHC: 33.3 g/dL (ref 32.0–36.0)
MCV: 85 fL (ref 81–101)
MONO#: 0.4 10*3/uL (ref 0.1–0.9)
MONO%: 11.5 % (ref 0.0–13.0)
NEUT#: 1.7 10*3/uL (ref 1.5–6.5)
NEUT%: 49.6 % (ref 39.6–80.0)
Platelets: 146 10*3/uL (ref 145–400)
RBC: 4.22 10*6/uL (ref 3.70–5.32)
RDW: 14.3 % (ref 11.1–15.7)
WBC: 3.5 10*3/uL — ABNORMAL LOW (ref 3.9–10.0)

## 2011-11-06 MED ORDER — ZOLEDRONIC ACID 4 MG/100ML IV SOLN
4.0000 mg | Freq: Once | INTRAVENOUS | Status: AC
  Administered 2011-11-06: 4 mg via INTRAVENOUS
  Filled 2011-11-06: qty 100

## 2011-11-06 MED ORDER — SODIUM CHLORIDE 0.9 % IV SOLN
Freq: Once | INTRAVENOUS | Status: AC
  Administered 2011-11-06: 13:00:00 via INTRAVENOUS

## 2011-11-06 NOTE — Progress Notes (Signed)
This office note has been dictated.

## 2011-11-06 NOTE — Progress Notes (Signed)
CC:   Amy White, M.D. Amy Klein, MD  DIAGNOSIS:  Metastatic breast cancer, bone metastasis.  CURRENT THERAPY: 1. Femara 2.5 mg p.o. daily. 2. Zometa 4 mg IV q.3 months.  INTERIM HISTORY:  Amy White comes in for her followup.  She is doing well.  She has had no complaints since we last saw her.  She has had no fevers, sweats or chills.  There is no progression of any kind of bony pain.  She is trying to work out in the yard with the warm weather coming.  She is worried about being bitten by ticks.  She has had no cough or shortness of breath.  She has had no leg swelling.  She has had no headache.  Her last tumor marker back in March was a CA27-29 of 30.  We are checking her vitamin D levels.  I think her last vitamin D level was 56.  We have not had to do any scans on her.  Her last bone scan was done back in February, which looked stable.  She had uptake at the iliac crest and T12 vertebral body.  Again, these are unchanged.  PHYSICAL EXAMINATION:  This is an elderly-appearing white female in no obvious distress.  Vital signs:  Temperature of 97.6, pulse 63, respiratory rate 20, blood pressure 136/70.  Weight is 174.  Head and neck:  Shows a normocephalic, atraumatic skull.  There are no ocular or oral lesions.  There are no palpable cervical or supraclavicular lymph nodes.  Lungs:  Clear bilaterally.  Cardiac:  Regular rate and rhythm with a normal S1 and S2.  There are no murmurs, rubs or bruits. Abdomen:  Soft with good bowel sounds.  There is no there is no fluid wave.  No palpable hepatosplenomegaly is noted.  Back:  Some kyphosis. There is no tenderness over the spine, ribs, or hips.  Extremities: Osteoarthritic changes in her joints.  Her hands mostly are affected. She does have some slight chronic nonpitting edema in her lower legs. Neurologic:  No focal neurological deficits.  LABORATORY STUDIES:  White cell count 3.5, hemoglobin 12, hematocrit  36, platelet count 146.  IMPRESSION:  Amy White is an 78 year old white female with metastatic breast cancer.  To date, her disease has been confined to the bones. She has had very stable disease.  We have been following her now for over 6 years.  Everything has really gone quite well for her.  I really believe that Zometa is helping her.  I do want to continue this every 3 months.  We will plan to get her back to see [place] in another 3 months.  I do not see a need for any kind of scans right now or in the near future considering that she is doing well.    ______________________________ Volanda Napoleon, M.D. PRE/MEDQ  D:  11/06/2011  T:  11/06/2011  Job:  [id number]

## 2011-11-07 LAB — COMPREHENSIVE METABOLIC PANEL
ALT: 11 U/L (ref 0–35)
AST: 16 U/L (ref 0–37)
Albumin: 3.9 g/dL (ref 3.5–5.2)
Alkaline Phosphatase: 54 U/L (ref 39–117)
BUN: 10 mg/dL (ref 6–23)
CO2: 27 mEq/L (ref 19–32)
Calcium: 8.8 mg/dL (ref 8.4–10.5)
Chloride: 107 mEq/L (ref 96–112)
Creatinine, Ser: 0.76 mg/dL (ref 0.50–1.10)
Glucose, Bld: 81 mg/dL (ref 70–99)
Potassium: 4.1 mEq/L (ref 3.5–5.3)
Sodium: 140 mEq/L (ref 135–145)
Total Bilirubin: 1 mg/dL (ref 0.3–1.2)
Total Protein: 6.4 g/dL (ref 6.0–8.3)

## 2011-11-07 LAB — CANCER ANTIGEN 27.29: CA 27.29: 35 U/mL (ref 0–39)

## 2011-11-07 LAB — VITAMIN D 25 HYDROXY (VIT D DEFICIENCY, FRACTURES): Vit D, 25-Hydroxy: 30 ng/mL (ref 30–89)

## 2011-11-10 ENCOUNTER — Telehealth: Payer: Self-pay | Admitting: *Deleted

## 2011-11-10 NOTE — Telephone Encounter (Addendum)
Message copied by Orlando Penner on Tue Nov 10, 2011  4:40 PM ------      Message from: Volanda Napoleon      Created: Tue Nov 10, 2011  9:10 AM       Cal - labs look good.  Vit D is a little low!!!  Need to increase amount she is taking. How much is she on??  [employer] pt at home.  No answer and no answering machine.  Letter mailed to pt with above message.

## 2011-11-22 ENCOUNTER — Other Ambulatory Visit: Payer: Self-pay | Admitting: Hematology & Oncology

## 2012-02-04 ENCOUNTER — Other Ambulatory Visit (HOSPITAL_BASED_OUTPATIENT_CLINIC_OR_DEPARTMENT_OTHER): Payer: Medicare Other | Admitting: Lab

## 2012-02-04 ENCOUNTER — Ambulatory Visit (HOSPITAL_BASED_OUTPATIENT_CLINIC_OR_DEPARTMENT_OTHER): Payer: Medicare Other | Admitting: Hematology & Oncology

## 2012-02-04 ENCOUNTER — Ambulatory Visit (HOSPITAL_BASED_OUTPATIENT_CLINIC_OR_DEPARTMENT_OTHER): Payer: Medicare Other

## 2012-02-04 VITALS — BP 140/52 | HR 62 | Temp 97.9°F | Resp 18 | Ht 65.0 in | Wt 171.0 lb

## 2012-02-04 DIAGNOSIS — C7952 Secondary malignant neoplasm of bone marrow: Secondary | ICD-10-CM

## 2012-02-04 DIAGNOSIS — M818 Other osteoporosis without current pathological fracture: Secondary | ICD-10-CM

## 2012-02-04 DIAGNOSIS — C7951 Secondary malignant neoplasm of bone: Secondary | ICD-10-CM

## 2012-02-04 DIAGNOSIS — C50919 Malignant neoplasm of unspecified site of unspecified female breast: Secondary | ICD-10-CM

## 2012-02-04 DIAGNOSIS — T386X5A Adverse effect of antigonadotrophins, antiestrogens, antiandrogens, not elsewhere classified, initial encounter: Secondary | ICD-10-CM

## 2012-02-04 LAB — CBC WITH DIFFERENTIAL (CANCER CENTER ONLY)
BASO#: 0 10*3/uL (ref 0.0–0.2)
BASO%: 0.8 % (ref 0.0–2.0)
EOS%: 4.5 % (ref 0.0–7.0)
Eosinophils Absolute: 0.2 10*3/uL (ref 0.0–0.5)
HCT: 37.1 % (ref 34.8–46.6)
HGB: 12.4 g/dL (ref 11.6–15.9)
LYMPH#: 1.3 10*3/uL (ref 0.9–3.3)
LYMPH%: 36.1 % (ref 14.0–48.0)
MCH: 28.4 pg (ref 26.0–34.0)
MCHC: 33.4 g/dL (ref 32.0–36.0)
MCV: 85 fL (ref 81–101)
MONO#: 0.4 10*3/uL (ref 0.1–0.9)
MONO%: 10.1 % (ref 0.0–13.0)
NEUT#: 1.7 10*3/uL (ref 1.5–6.5)
NEUT%: 48.5 % (ref 39.6–80.0)
Platelets: 161 10*3/uL (ref 145–400)
RBC: 4.37 10*6/uL (ref 3.70–5.32)
RDW: 14.3 % (ref 11.1–15.7)
WBC: 3.6 10*3/uL — ABNORMAL LOW (ref 3.9–10.0)

## 2012-02-04 MED ORDER — ZOLEDRONIC ACID 4 MG/100ML IV SOLN
4.0000 mg | Freq: Once | INTRAVENOUS | Status: AC
  Administered 2012-02-04: 4 mg via INTRAVENOUS
  Filled 2012-02-04: qty 100

## 2012-02-04 MED ORDER — SODIUM CHLORIDE 0.9 % IV SOLN
Freq: Once | INTRAVENOUS | Status: AC
  Administered 2012-02-04: 15:00:00 via INTRAVENOUS

## 2012-02-04 NOTE — Progress Notes (Signed)
CC:   Amy White, M.D. Amy Klein, MD  DIAGNOSIS:  Metastatic breast cancer, bone metastases only.  CURRENT THERAPY: 1. Femara 2.5 mg p.o. daily. 2. Zometa 4 mg IV q.3 months.  INTERIM HISTORY:  Amy White comes in for her followup.  She is feeling pretty well.  She has complained of a "cyst" on her back that may need to be removed.  This is in the mid back.  She has had this for a while. I think she sees Amy White for this.  Otherwise, she has had no problems with bony pain.  She has had bad issues with arthritis.  She has had no leg swelling.  There have been no rashes.  There has been no bleeding.  Her last vitamin D was 30 back in May.  She has had a good appetite.  She has had no cough.  There has been no dysphagia or odynophagia.  PHYSICAL EXAMINATION:  This is an elderly but well-nourished white female in no obvious distress.  Vital signs:  Temperature of 97.9, pulse 62, respiratory rate 18, blood pressure 140/52.  Weight is 171.  Head and neck:  Normocephalic, atraumatic skull.  There are no ocular or oral lesions.  There are no palpable cervical or supraclavicular lymph nodes. Lungs:  Clear bilaterally.  Cardiac:  Regular rate and rhythm with normal S1, S2.  There are no murmurs, rubs or bruits.  Abdomen:  Soft with good bowel sounds.  There is no palpable abdominal mass.  There is no fluid wave.  There is no palpable hepatosplenomegaly.  Back:  Shows no tenderness over the spine, ribs, or hips.  She has this cyst in the mid thoracic spine right over the spine itself.  Extremities:  Shows some trace edema in her legs bilaterally.  Neurological:  Shows no focal neurological deficits.  Skin:  Numerous seborrheic keratosis.  LABORATORY STUDIES:  White cell count is 3.6, hemoglobin 12.4, hematocrit 37.1, platelet count 161.  IMPRESSION:  Amy White is an 78 year old white female with metastatic breast cancer.  We have been following her now for over 6 years.   She has done incredibly well.  Today, there has been no evidence of progression.  She is asymptomatic from my point of view with respect to her metastatic disease.  Her lab work has looked good.  I do not think we have to do any scans on her right now.  We do follow her tumor marker.  Her last CA 27-29 was normal at 35.  We will plan to get her back to see [place] in 3 more months.  This is typically a good interval for her.    ______________________________ Volanda Napoleon, M.D. PRE/MEDQ  D:  02/04/2012  T:  02/04/2012  Job:  [id number]

## 2012-02-04 NOTE — Progress Notes (Signed)
This office note has been dictated.

## 2012-02-04 NOTE — Patient Instructions (Signed)
Call if any problems.  We will see you back in 3 months for another checkup in a bone infusion.

## 2012-02-04 NOTE — Patient Instructions (Signed)
Zoledronic Acid injection (Hypercalcemia, Oncology) What is this medicine? ZOLEDRONIC ACID (ZOE le dron ik AS id) lowers the amount of calcium loss from bone. It is used to treat too much calcium in your blood from cancer. It is also used to prevent complications of cancer that has spread to the bone. This medicine may be used for other purposes; ask your health care provider or pharmacist if you have questions. What should I tell my health care provider before I take this medicine? They need to know if you have any of these conditions: -aspirin-sensitive asthma -dental disease -kidney disease -an unusual or allergic reaction to zoledronic acid, other medicines, foods, dyes, or preservatives -pregnant or trying to get pregnant -breast-feeding How should I use this medicine? This medicine is for infusion into a vein. It is given by a health care professional in a hospital or clinic setting. Talk to your pediatrician regarding the use of this medicine in children. Special care may be needed. Overdosage: If you think you have taken too much of this medicine contact a poison control center or emergency room at once. NOTE: This medicine is only for you. Do not share this medicine with others. What if I miss a dose? It is important not to miss your dose. Call your doctor or health care professional if you are unable to keep an appointment. What may interact with this medicine? -certain antibiotics given by injection -NSAIDs, medicines for pain and inflammation, like ibuprofen or naproxen -some diuretics like bumetanide, furosemide -teriparatide -thalidomide This list may not describe all possible interactions. Give your health care provider a list of all the medicines, herbs, non-prescription drugs, or dietary supplements you use. Also tell them if you smoke, drink alcohol, or use illegal drugs. Some items may interact with your medicine. What should I watch for while using this medicine? Visit  your doctor or health care professional for regular checkups. It may be some time before you see the benefit from this medicine. Do not stop taking your medicine unless your doctor tells you to. Your doctor may order blood tests or other tests to see how you are doing. Women should inform their doctor if they wish to become pregnant or think they might be pregnant. There is a potential for serious side effects to an unborn child. Talk to your health care professional or pharmacist for more information. You should make sure that you get enough calcium and vitamin D while you are taking this medicine. Discuss the foods you eat and the vitamins you take with your health care professional. Some people who take this medicine have severe bone, joint, and/or muscle pain. This medicine may also increase your risk for a broken thigh bone. Tell your doctor right away if you have pain in your upper leg or groin. Tell your doctor if you have any pain that does not go away or that gets worse. What side effects may I notice from receiving this medicine? Side effects that you should report to your doctor or health care professional as soon as possible: -allergic reactions like skin rash, itching or hives, swelling of the face, lips, or tongue -anxiety, confusion, or depression -breathing problems -changes in vision -feeling faint or lightheaded, falls -jaw burning, cramping, pain -muscle cramps, stiffness, or weakness -trouble passing urine or change in the amount of urine Side effects that usually do not require medical attention (report to your doctor or health care professional if they continue or are bothersome): -bone, joint, or muscle pain -  fever -hair loss -irritation at site where injected -loss of appetite -nausea, vomiting -stomach upset -tired This list may not describe all possible side effects. Call your doctor for medical advice about side effects. You may report side effects to FDA at  1-800-FDA-1088. Where should I keep my medicine? This drug is given in a hospital or clinic and will not be stored at home. NOTE: This sheet is a summary. It may not cover all possible information. If you have questions about this medicine, talk to your doctor, pharmacist, or health care provider.  2012, [employer]. (11/21/2010 9:06:58 AM)

## 2012-02-05 ENCOUNTER — Ambulatory Visit: Payer: Medicare Other | Admitting: Hematology & Oncology

## 2012-02-05 ENCOUNTER — Other Ambulatory Visit: Payer: Medicare Other | Admitting: Lab

## 2012-02-05 ENCOUNTER — Ambulatory Visit: Payer: Medicare Other

## 2012-02-05 LAB — COMPREHENSIVE METABOLIC PANEL
ALT: 11 U/L (ref 0–35)
AST: 18 U/L (ref 0–37)
Albumin: 4.1 g/dL (ref 3.5–5.2)
Alkaline Phosphatase: 51 U/L (ref 39–117)
BUN: 16 mg/dL (ref 6–23)
CO2: 28 mEq/L (ref 19–32)
Calcium: 9.3 mg/dL (ref 8.4–10.5)
Chloride: 107 mEq/L (ref 96–112)
Creatinine, Ser: 0.8 mg/dL (ref 0.50–1.10)
Glucose, Bld: 83 mg/dL (ref 70–99)
Potassium: 4.4 mEq/L (ref 3.5–5.3)
Sodium: 141 mEq/L (ref 135–145)
Total Bilirubin: 0.9 mg/dL (ref 0.3–1.2)
Total Protein: 6.6 g/dL (ref 6.0–8.3)

## 2012-02-05 LAB — CANCER ANTIGEN 27.29: CA 27.29: 35 U/mL (ref 0–39)

## 2012-02-05 LAB — VITAMIN D 25 HYDROXY (VIT D DEFICIENCY, FRACTURES): Vit D, 25-Hydroxy: 37 ng/mL (ref 30–89)

## 2012-02-12 ENCOUNTER — Telehealth: Payer: Self-pay | Admitting: *Deleted

## 2012-02-12 NOTE — Telephone Encounter (Signed)
Called patient to let her know that her labs are good per dr. Marin Olp

## 2012-02-12 NOTE — Telephone Encounter (Signed)
Message copied by Rico Ala on Fri Feb 12, 2012  3:11 PM ------      Message from: Burney Gauze R      Created: Wed Feb 10, 2012  7:20 AM       Call - labs are great!!!

## 2012-05-02 ENCOUNTER — Ambulatory Visit (HOSPITAL_BASED_OUTPATIENT_CLINIC_OR_DEPARTMENT_OTHER): Payer: Medicare Other

## 2012-05-02 ENCOUNTER — Ambulatory Visit (HOSPITAL_BASED_OUTPATIENT_CLINIC_OR_DEPARTMENT_OTHER): Payer: Medicare Other | Admitting: Hematology & Oncology

## 2012-05-02 ENCOUNTER — Other Ambulatory Visit: Payer: Self-pay | Admitting: Hematology & Oncology

## 2012-05-02 ENCOUNTER — Other Ambulatory Visit (HOSPITAL_BASED_OUTPATIENT_CLINIC_OR_DEPARTMENT_OTHER): Payer: Medicare Other | Admitting: Lab

## 2012-05-02 VITALS — BP 126/54 | HR 65 | Temp 97.7°F | Resp 16 | Ht 66.0 in | Wt 166.0 lb

## 2012-05-02 DIAGNOSIS — C50919 Malignant neoplasm of unspecified site of unspecified female breast: Secondary | ICD-10-CM

## 2012-05-02 DIAGNOSIS — C7951 Secondary malignant neoplasm of bone: Secondary | ICD-10-CM

## 2012-05-02 DIAGNOSIS — C7952 Secondary malignant neoplasm of bone marrow: Secondary | ICD-10-CM

## 2012-05-02 LAB — CBC WITH DIFFERENTIAL (CANCER CENTER ONLY)
BASO#: 0 10*3/uL (ref 0.0–0.2)
BASO%: 0.8 % (ref 0.0–2.0)
EOS%: 3.3 % (ref 0.0–7.0)
Eosinophils Absolute: 0.1 10*3/uL (ref 0.0–0.5)
HCT: 39.4 % (ref 34.8–46.6)
HGB: 13.5 g/dL (ref 11.6–15.9)
LYMPH#: 1 10*3/uL (ref 0.9–3.3)
LYMPH%: 25.3 % (ref 14.0–48.0)
MCH: 28.8 pg (ref 26.0–34.0)
MCHC: 34.3 g/dL (ref 32.0–36.0)
MCV: 84 fL (ref 81–101)
MONO#: 0.3 10*3/uL (ref 0.1–0.9)
MONO%: 8.6 % (ref 0.0–13.0)
NEUT#: 2.5 10*3/uL (ref 1.5–6.5)
NEUT%: 62 % (ref 39.6–80.0)
Platelets: 175 10*3/uL (ref 145–400)
RBC: 4.69 10*6/uL (ref 3.70–5.32)
RDW: 13.9 % (ref 11.1–15.7)
WBC: 4 10*3/uL (ref 3.9–10.0)

## 2012-05-02 MED ORDER — ZOLEDRONIC ACID 4 MG/5ML IV CONC
4.0000 mg | Freq: Once | INTRAVENOUS | Status: AC
  Administered 2012-05-02: 4 mg via INTRAVENOUS
  Filled 2012-05-02: qty 5

## 2012-05-02 MED ORDER — SODIUM CHLORIDE 0.9 % IV SOLN
Freq: Once | INTRAVENOUS | Status: AC
  Administered 2012-05-02: 15:00:00 via INTRAVENOUS

## 2012-05-02 NOTE — Patient Instructions (Signed)
Zoledronic Acid injection (Hypercalcemia, Oncology) What is this medicine? ZOLEDRONIC ACID (ZOE le dron ik AS id) lowers the amount of calcium loss from bone. It is used to treat too much calcium in your blood from cancer. It is also used to prevent complications of cancer that has spread to the bone. This medicine may be used for other purposes; ask your health care provider or pharmacist if you have questions. What should I tell my health care provider before I take this medicine? They need to know if you have any of these conditions: -aspirin-sensitive asthma -dental disease -kidney disease -an unusual or allergic reaction to zoledronic acid, other medicines, foods, dyes, or preservatives -pregnant or trying to get pregnant -breast-feeding How should I use this medicine? This medicine is for infusion into a vein. It is given by a health care professional in a hospital or clinic setting. Talk to your pediatrician regarding the use of this medicine in children. Special care may be needed. Overdosage: If you think you have taken too much of this medicine contact a poison control center or emergency room at once. NOTE: This medicine is only for you. Do not share this medicine with others. What if I miss a dose? It is important not to miss your dose. Call your doctor or health care professional if you are unable to keep an appointment. What may interact with this medicine? -certain antibiotics given by injection -NSAIDs, medicines for pain and inflammation, like ibuprofen or naproxen -some diuretics like bumetanide, furosemide -teriparatide -thalidomide This list may not describe all possible interactions. Give your health care provider a list of all the medicines, herbs, non-prescription drugs, or dietary supplements you use. Also tell them if you smoke, drink alcohol, or use illegal drugs. Some items may interact with your medicine. What should I watch for while using this medicine? Visit  your doctor or health care professional for regular checkups. It may be some time before you see the benefit from this medicine. Do not stop taking your medicine unless your doctor tells you to. Your doctor may order blood tests or other tests to see how you are doing. Women should inform their doctor if they wish to become pregnant or think they might be pregnant. There is a potential for serious side effects to an unborn child. Talk to your health care professional or pharmacist for more information. You should make sure that you get enough calcium and vitamin D while you are taking this medicine. Discuss the foods you eat and the vitamins you take with your health care professional. Some people who take this medicine have severe bone, joint, and/or muscle pain. This medicine may also increase your risk for a broken thigh bone. Tell your doctor right away if you have pain in your upper leg or groin. Tell your doctor if you have any pain that does not go away or that gets worse. What side effects may I notice from receiving this medicine? Side effects that you should report to your doctor or health care professional as soon as possible: -allergic reactions like skin rash, itching or hives, swelling of the face, lips, or tongue -anxiety, confusion, or depression -breathing problems -changes in vision -feeling faint or lightheaded, falls -jaw burning, cramping, pain -muscle cramps, stiffness, or weakness -trouble passing urine or change in the amount of urine Side effects that usually do not require medical attention (report to your doctor or health care professional if they continue or are bothersome): -bone, joint, or muscle pain -  fever -hair loss -irritation at site where injected -loss of appetite -nausea, vomiting -stomach upset -tired This list may not describe all possible side effects. Call your doctor for medical advice about side effects. You may report side effects to FDA at  1-800-FDA-1088. Where should I keep my medicine? This drug is given in a hospital or clinic and will not be stored at home. NOTE: This sheet is a summary. It may not cover all possible information. If you have questions about this medicine, talk to your doctor, pharmacist, or health care provider.  2012, [employer]. (11/21/2010 9:06:58 AM)

## 2012-05-02 NOTE — Progress Notes (Signed)
This office note has been dictated.

## 2012-05-03 LAB — COMPREHENSIVE METABOLIC PANEL
ALT: 10 U/L (ref 0–35)
AST: 17 U/L (ref 0–37)
Albumin: 4.1 g/dL (ref 3.5–5.2)
Alkaline Phosphatase: 46 U/L (ref 39–117)
BUN: 14 mg/dL (ref 6–23)
CO2: 26 mEq/L (ref 19–32)
Calcium: 9.3 mg/dL (ref 8.4–10.5)
Chloride: 104 mEq/L (ref 96–112)
Creatinine, Ser: 0.84 mg/dL (ref 0.50–1.10)
Glucose, Bld: 89 mg/dL (ref 70–99)
Potassium: 4.4 mEq/L (ref 3.5–5.3)
Sodium: 138 mEq/L (ref 135–145)
Total Bilirubin: 1.6 mg/dL — ABNORMAL HIGH (ref 0.3–1.2)
Total Protein: 6.7 g/dL (ref 6.0–8.3)

## 2012-05-03 LAB — CANCER ANTIGEN 27.29: CA 27.29: 41 U/mL — ABNORMAL HIGH (ref 0–39)

## 2012-05-03 NOTE — Progress Notes (Signed)
CC:   Mayra Neer, M.D. Sanda Klein, MD  DIAGNOSIS:  Metastatic breast cancer, bone metastases.  CURRENT THERAPY: 1. Femara 2.5 mg p.o. daily. 2. Zometa 4 mg IV q.3 months.  INTERIM HISTORY:  Ms. Gill comes in for her followup.  She is really doing well.  She has had no problems since we last saw her.  She has had no fatigue or weakness.  There has been no cough.  She has occasional bony pain, which has been more chronic than anything else.  She has really terrible arthritis issues.  Again, nothing is different with this.  She has had no problems with bowels or bladder.  There has been no leg swelling.  Her last CA27.29 which we follow when we see her was 35.  Her last bone scan that was done was back in February of this past year.  PHYSICAL EXAMINATION:  General:  This is an elderly white female in no obvious distress.  Vital signs:  97.7, pulse 65, respiratory rate 16, blood pressure 126/54.  Weight is 166.  Head and neck:  Shows a normocephalic, atraumatic skull.  There are no ocular or oral lesions. There are no palpable cervical or supraclavicular lymph nodes.  Lungs: Clear bilaterally.  Cardiac:  Regular rate and rhythm with a normal S1 and S2.  There are no murmurs, rubs or bruits.  Abdomen:  Soft with good bowel sounds.  There is no palpable abdominal mass.  There is no palpable hepatosplenomegaly.  Back:  No tenderness over the spine, ribs or hips.  Extremities:  Shows no clubbing, cyanosis or edema.  She does have severe arthritic changes in her joints.  Neurologic:  No focal neurological deficits.  LABORATORY STUDIES:  White cell count is 4, hemoglobin 13.5, hematocrit 39.4, platelet count 175.  IMPRESSION:  Ms. Butt is an 78 year old white female with metastatic breast cancer.  So far, her disease has been confined to her bones.  We have been following her now for over 5 years.  We have actually seen her for almost 7 years.  Again, she has done very,  very well.  There is no evidence of progression.  We will go ahead and plan for another bone scan in February of 2014. Hopefully, we will find that the bone scan has not changed significantly, if at all.  I think the Zometa is helping this every 3 months.  We will go ahead and plan to see her back in February.    ______________________________ Volanda Napoleon, M.D. PRE/MEDQ  D:  05/02/2012  T:  05/03/2012  Job:  [id number]

## 2012-05-17 ENCOUNTER — Telehealth: Payer: Self-pay | Admitting: Hematology & Oncology

## 2012-05-17 NOTE — Telephone Encounter (Addendum)
Message copied by Trevor Mace on Tue May 17, 2012  9:45 AM ------      Message from: [place], [place]      Created: Wed May 04, 2012 12:50 PM                   ----- Message -----         From: Volanda Napoleon, MD         Sent: 05/03/2012   7:33 AM           To: Adella Nissen Nurse Hp            Call and tell her that her labs look okay. Thanks. Laurey Arrow  05-17-12-- 9:45 am, Called patient on home phone and left above MD message,instructed patient   If any questions to call office. Roxan Diesel LPN

## 2012-05-23 ENCOUNTER — Other Ambulatory Visit: Payer: Self-pay | Admitting: Hematology & Oncology

## 2012-06-30 ENCOUNTER — Other Ambulatory Visit: Payer: Self-pay | Admitting: Family Medicine

## 2012-06-30 DIAGNOSIS — R1013 Epigastric pain: Secondary | ICD-10-CM

## 2012-07-04 ENCOUNTER — Ambulatory Visit
Admission: RE | Admit: 2012-07-04 | Discharge: 2012-07-04 | Disposition: A | Discharge status: Home / Self Care | Payer: Medicare Other | Source: Ambulatory Visit | Attending: Family Medicine | Admitting: Family Medicine

## 2012-07-04 DIAGNOSIS — R1013 Epigastric pain: Secondary | ICD-10-CM

## 2012-07-25 ENCOUNTER — Encounter (HOSPITAL_COMMUNITY)
Admission: RE | Admit: 2012-07-25 | Discharge: 2012-07-25 | Disposition: A | Discharge status: Home / Self Care | Payer: Medicare Other | Source: Ambulatory Visit | Attending: Hematology & Oncology | Admitting: Hematology & Oncology

## 2012-07-25 DIAGNOSIS — C7951 Secondary malignant neoplasm of bone: Secondary | ICD-10-CM | POA: Insufficient documentation

## 2012-07-25 DIAGNOSIS — C7952 Secondary malignant neoplasm of bone marrow: Secondary | ICD-10-CM | POA: Insufficient documentation

## 2012-07-25 DIAGNOSIS — C50919 Malignant neoplasm of unspecified site of unspecified female breast: Secondary | ICD-10-CM | POA: Insufficient documentation

## 2012-07-25 MED ORDER — TECHNETIUM TC 99M MEDRONATE IV KIT
25.0000 | PACK | Freq: Once | INTRAVENOUS | Status: AC | PRN
  Administered 2012-07-25: 25 via INTRAVENOUS

## 2012-07-27 ENCOUNTER — Telehealth: Payer: Self-pay | Admitting: *Deleted

## 2012-07-27 NOTE — Telephone Encounter (Addendum)
Message copied by Jodelle Green on Wed Jul 27, 2012  9:50 AM ------      Message from: Burney Gauze R      Created: Mon Jul 25, 2012  6:12 PM       Call - NO change in the bone scan!!  This is excellent!!  Amy White   Spoke to pt. Gave her the above message with verbalized understanding.

## 2012-08-01 ENCOUNTER — Ambulatory Visit (HOSPITAL_BASED_OUTPATIENT_CLINIC_OR_DEPARTMENT_OTHER): Payer: Medicare Other

## 2012-08-01 ENCOUNTER — Ambulatory Visit (HOSPITAL_BASED_OUTPATIENT_CLINIC_OR_DEPARTMENT_OTHER): Payer: Medicare Other | Admitting: Hematology & Oncology

## 2012-08-01 ENCOUNTER — Other Ambulatory Visit (HOSPITAL_BASED_OUTPATIENT_CLINIC_OR_DEPARTMENT_OTHER): Payer: Medicare Other | Admitting: Lab

## 2012-08-01 ENCOUNTER — Other Ambulatory Visit: Payer: Self-pay | Admitting: Hematology & Oncology

## 2012-08-01 VITALS — BP 148/56 | HR 66 | Temp 97.8°F | Resp 16 | Ht 65.0 in | Wt 164.0 lb

## 2012-08-01 DIAGNOSIS — C50919 Malignant neoplasm of unspecified site of unspecified female breast: Secondary | ICD-10-CM

## 2012-08-01 DIAGNOSIS — C7951 Secondary malignant neoplasm of bone: Secondary | ICD-10-CM

## 2012-08-01 DIAGNOSIS — C50911 Malignant neoplasm of unspecified site of right female breast: Secondary | ICD-10-CM

## 2012-08-01 DIAGNOSIS — C7952 Secondary malignant neoplasm of bone marrow: Secondary | ICD-10-CM

## 2012-08-01 DIAGNOSIS — M129 Arthropathy, unspecified: Secondary | ICD-10-CM

## 2012-08-01 LAB — COMPREHENSIVE METABOLIC PANEL
ALT: 8 U/L (ref 0–35)
AST: 14 U/L (ref 0–37)
Albumin: 3.9 g/dL (ref 3.5–5.2)
Alkaline Phosphatase: 51 U/L (ref 39–117)
BUN: 13 mg/dL (ref 6–23)
CO2: 28 mEq/L (ref 19–32)
Calcium: 8.7 mg/dL (ref 8.4–10.5)
Chloride: 108 mEq/L (ref 96–112)
Creatinine, Ser: 0.8 mg/dL (ref 0.50–1.10)
Glucose, Bld: 89 mg/dL (ref 70–99)
Potassium: 4.4 mEq/L (ref 3.5–5.3)
Sodium: 142 mEq/L (ref 135–145)
Total Bilirubin: 1.2 mg/dL (ref 0.3–1.2)
Total Protein: 6.3 g/dL (ref 6.0–8.3)

## 2012-08-01 LAB — CBC WITH DIFFERENTIAL (CANCER CENTER ONLY)
BASO#: 0 10*3/uL (ref 0.0–0.2)
BASO%: 0.8 % (ref 0.0–2.0)
EOS%: 3.7 % (ref 0.0–7.0)
Eosinophils Absolute: 0.2 10*3/uL (ref 0.0–0.5)
HCT: 37.7 % (ref 34.8–46.6)
HGB: 12.6 g/dL (ref 11.6–15.9)
LYMPH#: 1.6 10*3/uL (ref 0.9–3.3)
LYMPH%: 32.6 % (ref 14.0–48.0)
MCH: 28.4 pg (ref 26.0–34.0)
MCHC: 33.4 g/dL (ref 32.0–36.0)
MCV: 85 fL (ref 81–101)
MONO#: 0.4 10*3/uL (ref 0.1–0.9)
MONO%: 8 % (ref 0.0–13.0)
NEUT#: 2.7 10*3/uL (ref 1.5–6.5)
NEUT%: 54.9 % (ref 39.6–80.0)
Platelets: 165 10*3/uL (ref 145–400)
RBC: 4.44 10*6/uL (ref 3.70–5.32)
RDW: 14.1 % (ref 11.1–15.7)
WBC: 4.9 10*3/uL (ref 3.9–10.0)

## 2012-08-01 LAB — CANCER ANTIGEN 27.29: CA 27.29: 36 U/mL (ref 0–39)

## 2012-08-01 MED ORDER — ZOLEDRONIC ACID 4 MG/100ML IV SOLN
4.0000 mg | Freq: Once | INTRAVENOUS | Status: AC
  Administered 2012-08-01: 4 mg via INTRAVENOUS
  Filled 2012-08-01: qty 100

## 2012-08-01 NOTE — Progress Notes (Signed)
This office note has been dictated.

## 2012-08-01 NOTE — Patient Instructions (Signed)
Zoledronic Acid injection (Hypercalcemia, Oncology) What is this medicine? ZOLEDRONIC ACID (ZOE le dron ik AS id) lowers the amount of calcium loss from bone. It is used to treat too much calcium in your blood from cancer. It is also used to prevent complications of cancer that has spread to the bone. This medicine may be used for other purposes; ask your health care provider or pharmacist if you have questions. What should I tell my health care provider before I take this medicine? They need to know if you have any of these conditions: -aspirin-sensitive asthma -dental disease -kidney disease -an unusual or allergic reaction to zoledronic acid, other medicines, foods, dyes, or preservatives -pregnant or trying to get pregnant -breast-feeding How should I use this medicine? This medicine is for infusion into a vein. It is given by a health care professional in a hospital or clinic setting. Talk to your pediatrician regarding the use of this medicine in children. Special care may be needed. Overdosage: If you think you have taken too much of this medicine contact a poison control center or emergency room at once. NOTE: This medicine is only for you. Do not share this medicine with others. What if I miss a dose? It is important not to miss your dose. Call your doctor or health care professional if you are unable to keep an appointment. What may interact with this medicine? -certain antibiotics given by injection -NSAIDs, medicines for pain and inflammation, like ibuprofen or naproxen -some diuretics like bumetanide, furosemide -teriparatide -thalidomide This list may not describe all possible interactions. Give your health care provider a list of all the medicines, herbs, non-prescription drugs, or dietary supplements you use. Also tell them if you smoke, drink alcohol, or use illegal drugs. Some items may interact with your medicine. What should I watch for while using this medicine? Visit  your doctor or health care professional for regular checkups. It may be some time before you see the benefit from this medicine. Do not stop taking your medicine unless your doctor tells you to. Your doctor may order blood tests or other tests to see how you are doing. Women should inform their doctor if they wish to become pregnant or think they might be pregnant. There is a potential for serious side effects to an unborn child. Talk to your health care professional or pharmacist for more information. You should make sure that you get enough calcium and vitamin D while you are taking this medicine. Discuss the foods you eat and the vitamins you take with your health care professional. Some people who take this medicine have severe bone, joint, and/or muscle pain. This medicine may also increase your risk for a broken thigh bone. Tell your doctor right away if you have pain in your upper leg or groin. Tell your doctor if you have any pain that does not go away or that gets worse. What side effects may I notice from receiving this medicine? Side effects that you should report to your doctor or health care professional as soon as possible: -allergic reactions like skin rash, itching or hives, swelling of the face, lips, or tongue -anxiety, confusion, or depression -breathing problems -changes in vision -feeling faint or lightheaded, falls -jaw burning, cramping, pain -muscle cramps, stiffness, or weakness -trouble passing urine or change in the amount of urine Side effects that usually do not require medical attention (report to your doctor or health care professional if they continue or are bothersome): -bone, joint, or muscle pain -  fever -hair loss -irritation at site where injected -loss of appetite -nausea, vomiting -stomach upset -tired This list may not describe all possible side effects. Call your doctor for medical advice about side effects. You may report side effects to FDA at  1-800-FDA-1088. Where should I keep my medicine? This drug is given in a hospital or clinic and will not be stored at home. NOTE: This sheet is a summary. It may not cover all possible information. If you have questions about this medicine, talk to your doctor, pharmacist, or health care provider.  2012, [employer]. (11/21/2010 9:06:58 AM)

## 2012-08-02 NOTE — Progress Notes (Signed)
CC:   Mayra Neer, M.D. Sanda Klein, MD  DIAGNOSIS:  Metastatic breast cancer-bone metastases.  CURRENT THERAPY: 1. Femara 2.5 mg p.o. daily. 2. Zometa 4 mg IV every 3 months.  INTERIM HISTORY:  Ms. Schrag comes in for followup.  She continues to do well.  She had a bone scan done recently.  This was done on February 17th.  The bone scan showed stable uptake.  She had activity that did not show any evidence of progression.  There is stable appearance in the right iliac crest and T12 vertebral body.  The T12 vertebral body actually has been biopsied before and has been, I think, negative for disease.  She is not really complaining of any kind of bony pain.  She does have bad arthritis.  Her last CA27.29 was holding steady at 41.  She has had no cough or shortness breath.  She has had no change in bowel or bladder habits.  There has been no headache.  She has had no nausea or vomiting.  PHYSICAL EXAMINATION:  General:  This is a well developed, well- nourished white female in no obvious distress.  Vital signs: Temperature of 97.8, pulse 66, respiratory rate 16, blood pressure 148/56.  Weight is 164.  Head and neck:  Normocephalic, atraumatic skull.  There are no ocular or oral lesions.  There are no palpable cervical or supraclavicular lymph nodes.  Lungs:  Clear bilaterally. Cardiac:  Regular rate and rhythm with a normal S1 and S2.  There are no murmurs, rubs, or bruits.  Abdomen:  Soft with good bowel sounds.  There is no palpable abdominal mass.  There is no palpable hepatosplenomegaly. Extremities:  Osteoarthritic changes in her joints.  LABORATORY STUDIES:  White cell count is 4.9, hemoglobin 12.6, hematocrit 37.7, platelet count 165.  IMPRESSION:  Ms. Labrosse is an 79 year old white female with metastatic breast cancer.  We have been seeing her now for over 7 years.  Again, she has done incredibly well.  Her quality of life is good.  The breast cancer really  has not bothered her.  We will continue to have her come back every 3 months.  This really has worked well for her.   ______________________________ Volanda Napoleon, M.D. PRE/MEDQ  D:  08/01/2012  T:  08/02/2012  Job:  [id number]

## 2012-08-03 ENCOUNTER — Telehealth: Payer: Self-pay | Admitting: *Deleted

## 2012-08-03 NOTE — Telephone Encounter (Signed)
Called patient to let her know that her labwork was ok per dr. Marin Olp

## 2012-08-03 NOTE — Telephone Encounter (Signed)
Message copied by Rico Ala on Wed Aug 03, 2012 11:51 AM ------      Message from: Volanda Napoleon      Created: Tue Aug 02, 2012  8:13 PM       Call -  Labs are ok!! Laurey Arrow ------

## 2012-08-24 ENCOUNTER — Ambulatory Visit: Payer: Self-pay | Admitting: Cardiovascular Disease

## 2012-08-24 DIAGNOSIS — I482 Chronic atrial fibrillation, unspecified: Secondary | ICD-10-CM | POA: Insufficient documentation

## 2012-08-24 DIAGNOSIS — Z7901 Long term (current) use of anticoagulants: Secondary | ICD-10-CM | POA: Insufficient documentation

## 2012-08-24 DIAGNOSIS — I4891 Unspecified atrial fibrillation: Secondary | ICD-10-CM

## 2012-09-23 ENCOUNTER — Telehealth: Payer: Self-pay | Admitting: Hematology & Oncology

## 2012-09-23 NOTE — Telephone Encounter (Signed)
Left message moved 5-19 to 5-15

## 2012-09-24 ENCOUNTER — Other Ambulatory Visit: Payer: Self-pay | Admitting: Hematology & Oncology

## 2012-10-17 ENCOUNTER — Telehealth: Payer: Self-pay | Admitting: Hematology & Oncology

## 2012-10-17 NOTE — Telephone Encounter (Signed)
Pt cx 5-15 will call back to reschedule

## 2012-10-20 ENCOUNTER — Ambulatory Visit: Payer: Medicare Other | Admitting: Hematology & Oncology

## 2012-10-20 ENCOUNTER — Ambulatory Visit: Payer: Medicare Other

## 2012-10-20 ENCOUNTER — Other Ambulatory Visit: Payer: Medicare Other | Admitting: Lab

## 2012-10-24 ENCOUNTER — Ambulatory Visit: Payer: Medicare Other | Admitting: Hematology & Oncology

## 2012-10-24 ENCOUNTER — Ambulatory Visit: Payer: Medicare Other

## 2012-10-24 ENCOUNTER — Other Ambulatory Visit: Payer: Medicare Other | Admitting: Lab

## 2012-11-09 ENCOUNTER — Ambulatory Visit (INDEPENDENT_AMBULATORY_CARE_PROVIDER_SITE_OTHER): Payer: Medicare Other | Admitting: Pharmacist Clinician (PhC)/ Clinical Pharmacy Specialist

## 2012-11-09 VITALS — BP 136/62 | HR 68

## 2012-11-09 DIAGNOSIS — Z7901 Long term (current) use of anticoagulants: Secondary | ICD-10-CM

## 2012-11-09 DIAGNOSIS — I4891 Unspecified atrial fibrillation: Secondary | ICD-10-CM

## 2012-11-09 LAB — POCT INR: INR: 2.7

## 2012-11-13 ENCOUNTER — Other Ambulatory Visit: Payer: Self-pay | Admitting: Cardiovascular Disease

## 2012-12-06 ENCOUNTER — Telehealth: Payer: Self-pay | Admitting: Hematology & Oncology

## 2012-12-06 ENCOUNTER — Other Ambulatory Visit: Payer: Self-pay | Admitting: Hematology & Oncology

## 2012-12-06 DIAGNOSIS — C50911 Malignant neoplasm of unspecified site of right female breast: Secondary | ICD-10-CM

## 2012-12-06 DIAGNOSIS — C7951 Secondary malignant neoplasm of bone: Secondary | ICD-10-CM

## 2012-12-06 NOTE — Telephone Encounter (Signed)
Per Md order to sch Bone Scan.  i spoke with Tiffany in scheduling and schedule apt for 12/13/12 at [facility].  i called patient and gave apt date/time and instructions.  Patient is aware of apt

## 2012-12-13 ENCOUNTER — Encounter (HOSPITAL_COMMUNITY)
Admission: RE | Admit: 2012-12-13 | Discharge: 2012-12-13 | Disposition: A | Discharge status: Home / Self Care | Payer: Medicare Other | Source: Ambulatory Visit | Attending: Hematology & Oncology | Admitting: Hematology & Oncology

## 2012-12-13 DIAGNOSIS — C50919 Malignant neoplasm of unspecified site of unspecified female breast: Secondary | ICD-10-CM | POA: Insufficient documentation

## 2012-12-13 DIAGNOSIS — C50911 Malignant neoplasm of unspecified site of right female breast: Secondary | ICD-10-CM

## 2012-12-13 MED ORDER — TECHNETIUM TC 99M MEDRONATE IV KIT
25.0000 | PACK | Freq: Once | INTRAVENOUS | Status: AC | PRN
  Administered 2012-12-13: 25 via INTRAVENOUS

## 2012-12-16 ENCOUNTER — Other Ambulatory Visit: Payer: Self-pay | Admitting: Hematology & Oncology

## 2012-12-16 DIAGNOSIS — C50919 Malignant neoplasm of unspecified site of unspecified female breast: Secondary | ICD-10-CM

## 2012-12-16 DIAGNOSIS — C7951 Secondary malignant neoplasm of bone: Secondary | ICD-10-CM

## 2012-12-19 ENCOUNTER — Telehealth: Payer: Self-pay | Admitting: Hematology & Oncology

## 2012-12-19 NOTE — Telephone Encounter (Signed)
Left pt message with 7-22 appointment

## 2012-12-21 ENCOUNTER — Ambulatory Visit (INDEPENDENT_AMBULATORY_CARE_PROVIDER_SITE_OTHER): Payer: Medicare Other | Admitting: Pharmacist Clinician (PhC)/ Clinical Pharmacy Specialist

## 2012-12-21 VITALS — BP 130/60 | HR 72

## 2012-12-21 DIAGNOSIS — Z7901 Long term (current) use of anticoagulants: Secondary | ICD-10-CM

## 2012-12-21 DIAGNOSIS — I4891 Unspecified atrial fibrillation: Secondary | ICD-10-CM

## 2012-12-21 LAB — POCT INR: INR: 2.2

## 2012-12-27 ENCOUNTER — Other Ambulatory Visit (HOSPITAL_BASED_OUTPATIENT_CLINIC_OR_DEPARTMENT_OTHER): Payer: Medicare Other | Admitting: Lab

## 2012-12-27 ENCOUNTER — Ambulatory Visit (HOSPITAL_BASED_OUTPATIENT_CLINIC_OR_DEPARTMENT_OTHER): Payer: Medicare Other | Admitting: Hematology & Oncology

## 2012-12-27 ENCOUNTER — Ambulatory Visit (HOSPITAL_BASED_OUTPATIENT_CLINIC_OR_DEPARTMENT_OTHER): Payer: Medicare Other

## 2012-12-27 VITALS — BP 149/59 | HR 78 | Temp 98.0°F | Resp 16 | Ht 65.0 in | Wt 167.0 lb

## 2012-12-27 DIAGNOSIS — C50919 Malignant neoplasm of unspecified site of unspecified female breast: Secondary | ICD-10-CM

## 2012-12-27 DIAGNOSIS — C7951 Secondary malignant neoplasm of bone: Secondary | ICD-10-CM

## 2012-12-27 DIAGNOSIS — C50911 Malignant neoplasm of unspecified site of right female breast: Secondary | ICD-10-CM

## 2012-12-27 DIAGNOSIS — M129 Arthropathy, unspecified: Secondary | ICD-10-CM

## 2012-12-27 DIAGNOSIS — C7952 Secondary malignant neoplasm of bone marrow: Secondary | ICD-10-CM

## 2012-12-27 LAB — CMP (CANCER CENTER ONLY)
ALT(SGPT): 15 U/L (ref 10–47)
AST: 22 U/L (ref 11–38)
Albumin: 3.4 g/dL (ref 3.3–5.5)
Alkaline Phosphatase: 48 U/L (ref 26–84)
BUN, Bld: 14 mg/dL (ref 7–22)
CO2: 29 meq/L (ref 18–33)
Calcium: 8.9 mg/dL (ref 8.0–10.3)
Chloride: 107 meq/L (ref 98–108)
Creat: 0.8 mg/dL (ref 0.6–1.2)
Glucose, Bld: 115 mg/dL (ref 73–118)
Potassium: 4.1 meq/L (ref 3.3–4.7)
Sodium: 139 meq/L (ref 128–145)
Total Bilirubin: 1.4 mg/dL (ref 0.20–1.60)
Total Protein: 6.6 g/dL (ref 6.4–8.1)

## 2012-12-27 LAB — CBC WITH DIFFERENTIAL (CANCER CENTER ONLY)
BASO#: 0 10e3/uL (ref 0.0–0.2)
BASO%: 0.6 % (ref 0.0–2.0)
EOS%: 5.4 % (ref 0.0–7.0)
Eosinophils Absolute: 0.2 10e3/uL (ref 0.0–0.5)
HCT: 38 % (ref 34.8–46.6)
HGB: 12.6 g/dL (ref 11.6–15.9)
LYMPH#: 1.2 10e3/uL (ref 0.9–3.3)
LYMPH%: 32.9 % (ref 14.0–48.0)
MCH: 28.5 pg (ref 26.0–34.0)
MCHC: 33.2 g/dL (ref 32.0–36.0)
MCV: 86 fL (ref 81–101)
MONO#: 0.3 10e3/uL (ref 0.1–0.9)
MONO%: 9.1 % (ref 0.0–13.0)
NEUT#: 1.8 10e3/uL (ref 1.5–6.5)
NEUT%: 52 % (ref 39.6–80.0)
Platelets: 148 10e3/uL (ref 145–400)
RBC: 4.42 10e6/uL (ref 3.70–5.32)
RDW: 14.1 % (ref 11.1–15.7)
WBC: 3.5 10e3/uL — ABNORMAL LOW (ref 3.9–10.0)

## 2012-12-27 LAB — CANCER ANTIGEN 27.29: CA 27.29: 40 U/mL — ABNORMAL HIGH (ref 0–39)

## 2012-12-27 MED ORDER — SODIUM CHLORIDE 0.9 % IJ SOLN
3.0000 mL | Freq: Once | INTRAMUSCULAR | Status: DC | PRN
  Filled 2012-12-27: qty 10

## 2012-12-27 MED ORDER — SODIUM CHLORIDE 0.9 % IJ SOLN
10.0000 mL | INTRAMUSCULAR | Status: DC | PRN
  Filled 2012-12-27: qty 10

## 2012-12-27 MED ORDER — ZOLEDRONIC ACID 4 MG/100ML IV SOLN
4.0000 mg | Freq: Once | INTRAVENOUS | Status: AC
  Administered 2012-12-27: 4 mg via INTRAVENOUS
  Filled 2012-12-27: qty 100

## 2012-12-27 NOTE — Progress Notes (Signed)
This office note has been dictated.

## 2012-12-28 NOTE — Progress Notes (Signed)
CC:   Mayra Neer, M.D. Sanda Klein, MD  DIAGNOSIS:  Metastatic breast cancer, bone mets.  CURRENT THERAPY: 1. Femara 2.5 mg p.o. daily. 2. Zometa 4 mg IV q.3 months.  INTERIM HISTORY:  Ms. Vandevoort comes in for followup.  She is doing fairly well.  She is complaining of some pain over on her right side. We did go ahead and get a bone scan on her.  The bone scan, which was done on July 8th showed stable areas of uptake.  Showed uptake at T12 and right iliac bone.  There were no new areas of disease that were noted on bone scan.  She says that the right side is feeling better now.  She has had a good appetite.  She has had no nausea or vomiting.  There has been no change in bowel or bladder habits.  There has been no problems with leg swelling.  She has had no rashes.  The patient does have pretty bad arthritis.  PHYSICAL EXAMINATION:  General:  This is a well-developed, well- nourished white female in no obvious distress.  Vital signs: Temperature of 98, pulse 78, respiratory rate 16, blood pressure 149/59. Weight is 167.  Head and neck:  Normocephalic, atraumatic skull.  There are no ocular or oral lesions.  There are no palpable cervical or supraclavicular lymph nodes.  Lungs:  Clear bilaterally.  Cardiac: Regular rate and rhythm with a normal S1 and S2.  There are no murmurs, rubs or bruits.  Abdomen:  Soft.  She has good bowel sounds.  There is no fluid wave.  No palpable hepatosplenomegaly is noted.  Extremities: Show no clubbing, cyanosis or edema.  She has osteoarthritic changes related to her osteoarthritis.  LABORATORY STUDIES:  White cell count is 3.5, hemoglobin 12.6, hematocrit 38, platelet count 148.  IMPRESSION:  Ms. Tomberlin is a very nice 79 year old white female with metastatic breast cancer.  She has bone only mets.  We have been following her along now for about 8 years.  So far, I have not seen any evidence of progressive disease.  We will go ahead  and plan for another followup in 3 months.  She does well with the Zometa every 3 months.   ______________________________ Volanda Napoleon, M.D. PRE/MEDQ  D:  12/27/2012  T:  12/28/2012  Job:  [id number]

## 2013-01-15 ENCOUNTER — Other Ambulatory Visit: Payer: Self-pay | Admitting: Cardiovascular Disease

## 2013-01-15 ENCOUNTER — Other Ambulatory Visit: Payer: Self-pay | Admitting: Hematology & Oncology

## 2013-01-16 NOTE — Telephone Encounter (Signed)
Rx was sent to pharmacy electronically. 

## 2013-02-01 ENCOUNTER — Ambulatory Visit (INDEPENDENT_AMBULATORY_CARE_PROVIDER_SITE_OTHER): Payer: Medicare Other | Admitting: Pharmacist Clinician (PhC)/ Clinical Pharmacy Specialist

## 2013-02-01 VITALS — BP 130/62 | HR 64

## 2013-02-01 DIAGNOSIS — Z7901 Long term (current) use of anticoagulants: Secondary | ICD-10-CM

## 2013-02-01 DIAGNOSIS — I4891 Unspecified atrial fibrillation: Secondary | ICD-10-CM

## 2013-02-01 LAB — POCT INR: INR: 2

## 2013-03-20 ENCOUNTER — Ambulatory Visit (INDEPENDENT_AMBULATORY_CARE_PROVIDER_SITE_OTHER): Payer: Medicare Other | Admitting: Cardiovascular Disease

## 2013-03-20 ENCOUNTER — Ambulatory Visit (INDEPENDENT_AMBULATORY_CARE_PROVIDER_SITE_OTHER): Payer: Medicare Other | Admitting: Pharmacist Clinician (PhC)/ Clinical Pharmacy Specialist

## 2013-03-20 ENCOUNTER — Encounter: Payer: Self-pay | Admitting: Cardiovascular Disease

## 2013-03-20 VITALS — BP 122/72 | HR 71 | Resp 16 | Ht 68.0 in | Wt 166.4 lb

## 2013-03-20 DIAGNOSIS — I495 Sick sinus syndrome: Secondary | ICD-10-CM

## 2013-03-20 DIAGNOSIS — I4891 Unspecified atrial fibrillation: Secondary | ICD-10-CM

## 2013-03-20 DIAGNOSIS — Z7901 Long term (current) use of anticoagulants: Secondary | ICD-10-CM

## 2013-03-20 LAB — POCT INR: INR: 2.2

## 2013-03-20 NOTE — Patient Instructions (Signed)
Your physician recommends that you schedule a follow-up appointment in: One year.  

## 2013-03-21 ENCOUNTER — Encounter: Payer: Self-pay | Admitting: Cardiovascular Disease

## 2013-03-21 DIAGNOSIS — I495 Sick sinus syndrome: Secondary | ICD-10-CM | POA: Insufficient documentation

## 2013-03-21 NOTE — Assessment & Plan Note (Signed)
No recent documented events. She has had episodes of paroxysmal atrial tachycardia, but is currently minimally symptomatic. Continue warfarin, barring serious bleeding complications.

## 2013-03-21 NOTE — Progress Notes (Signed)
Patient ID: Amy White, female   DOB: 1925-07-08, 79 y.o.   MRN: [id number]      Reason for office visit Atrial tachycardia/atrial fibrillation  Amy White returns for routine yearly followup. She continues to have occasional palpitations but for the most part feels well. She has a remote history of paroxysmal atrial fibrillation but there has been no documented episode of this arrhythmia in many years. She has not had stroke TIA or other embolic events.  Although she still has palpitations, attempts to increase antiarrhythmics in the past led to symptomatic bradycardia. She prefers noninvasive management. At least in part, this is due to the fact that she has breast cancer metastatic to the bone. It should be pointed out that her malignancy has been unchanged now for about 8 years after receiving radiation therapy and chronic Zometa chemotherapy.  She describes left-sided chest pain radiating down the ulnar aspect of her arm and forearm. This occurs at rest and is to some degree positional, related to movements of her shoulder. I suspect it is musculoskeletal. She had a normal nuclear stress test in 2007 and has never undergone coronary angiography.   Allergies  Allergen Reactions  . Ciprofloxacin Rash  . Penicillins Rash    Current Outpatient Prescriptions  Medication Sig Dispense Refill  . albuterol (PROVENTIL) (2.5 MG/3ML) 0.083% nebulizer solution Take 2.5 mg by nebulization every 6 (six) hours as needed.        . ALPRAZolam (XANAX) 0.25 MG tablet Take 0.5 mg by mouth at bedtime as needed.        Marland Kitchen letrozole (FEMARA) 2.5 MG tablet TAKE 1 TABLET EVERY DAY  30 tablet  3  . levothyroxine (SYNTHROID, LEVOTHROID) 75 MCG tablet Take 75 mg by mouth Daily.      . metoprolol succinate (TOPROL-XL) 25 MG 24 hr tablet TAKE 1 TABLET ONCE DAILY.  30 tablet  2  . Niacin (VITAMIN B-3 PO) Take 1,000 mg by mouth daily.        Marland Kitchen warfarin (COUMADIN) 5 MG tablet Take 5 mg by mouth Daily. 2.5 mg.  On Wed. And Saturday & 5 mg all other days.      . Zoledronic Acid (ZOMETA) 4 MG/100ML IVPB Inject 4 mg into the vein every 6 (six) weeks.      Marland Kitchen HYDROcodone-acetaminophen (NORCO/VICODIN) 5-325 MG per tablet Take 1 tablet by mouth as needed.        No current facility-administered medications for this visit.    Past Medical History  Diagnosis Date  . Breast cancer metastasized to bone 05/13/2011  . Cancer   . Thyroid disease   . Atrial fibrillation     Past Surgical History  Procedure Laterality Date  . Breast surgery      No family history on file.  History   Social History  . Marital Status: Widowed    Spouse Name: N/A    Number of Children: N/A  . Years of Education: N/A   Occupational History  . Not on file.   Social History Main Topics  . Smoking status: Never Smoker   . Smokeless tobacco: Not on file  . Alcohol Use: Yes  . Drug Use: No  . Sexual Activity:    Other Topics Concern  . Not on file   Social History Narrative  . No narrative on file    Review of systems: The patient specifically denies any chest pain at rest or with exertion, dyspnea at rest or with exertion, orthopnea, paroxysmal  nocturnal dyspnea, syncope, palpitations, focal neurological deficits, intermittent claudication, lower extremity edema, unexplained weight gain, cough, hemoptysis or wheezing.  The patient also denies abdominal pain, nausea, vomiting, dysphagia, diarrhea, constipation, polyuria, polydipsia, dysuria, hematuria, frequency, urgency, abnormal bleeding or bruising, fever, chills, unexpected weight changes, mood swings, change in skin or hair texture, change in voice quality, auditory or visual problems, allergic reactions or rashes, other musculoskeletal complaints except as described above   PHYSICAL EXAM BP 122/72  Pulse 71  Resp 16  Ht 5\' 8"  (1.727 m)  Wt 166 lb 6.4 oz (75.479 kg)  BMI 25.31 kg/m2  General: Alert, oriented x3, no distress Head: no evidence of  trauma, PERRL, EOMI, no exophtalmos or lid lag, no myxedema, no xanthelasma; normal ears, nose and oropharynx Neck: normal jugular venous pulsations and no hepatojugular reflux; brisk carotid pulses without delay and no carotid bruits Chest: clear to auscultation, no signs of consolidation by percussion or palpation, normal fremitus, symmetrical and full respiratory excursions Cardiovascular: normal position and quality of the apical impulse, regular rhythm, normal first and second heart sounds, no murmurs, rubs or gallops Abdomen: no tenderness or distention, no masses by palpation, no abnormal pulsatility or arterial bruits, normal bowel sounds, no hepatosplenomegaly Extremities: no clubbing, cyanosis or edema; 2+ radial, ulnar and brachial pulses bilaterally; 2+ right femoral, posterior tibial and dorsalis pedis pulses; 2+ left femoral, posterior tibial and dorsalis pedis pulses; no subclavian or femoral bruits Neurological: grossly nonfocal   EKG: NSR, normal  Lipid Panel    BMET    Component Value Date/Time   NA 139 12/27/2012 1126   NA 142 08/01/2012 1359   K 4.1 12/27/2012 1126   K 4.4 08/01/2012 1359   CL 107 12/27/2012 1126   CL 108 08/01/2012 1359   CO2 29 12/27/2012 1126   CO2 28 08/01/2012 1359   GLUCOSE 115 12/27/2012 1126   GLUCOSE 89 08/01/2012 1359   BUN 14 12/27/2012 1126   BUN 13 08/01/2012 1359   CREATININE 0.8 12/27/2012 1126   CREATININE 0.80 08/01/2012 1359   CALCIUM 8.9 12/27/2012 1126   CALCIUM 8.7 08/01/2012 1359   GFRNONAA 58* 04/10/2011 1817   GFRAA 68* 04/10/2011 1817     ASSESSMENT AND PLAN SSS (sick sinus syndrome) Previous attempts at higher dose beta blocker therapy led to frequent sinus pauses and symptomatic bradycardia. She declined a pacemaker.  Atrial fibrillation No recent documented events. She has had episodes of paroxysmal atrial tachycardia, but is currently minimally symptomatic. Continue warfarin, barring serious bleeding complications.  Breast  cancer metastasized to bone No evidence of malignancy progression in the last 8 years or so.   Orders Placed This Encounter  Procedures  . EKG 12-Lead   No orders of the defined types were placed in this encounter.    Holli Humbles, MD, [place] [phone] office [phone] pager

## 2013-03-21 NOTE — Assessment & Plan Note (Signed)
No evidence of malignancy progression in the last 8 years or so.

## 2013-03-21 NOTE — Assessment & Plan Note (Signed)
Previous attempts at higher dose beta blocker therapy led to frequent sinus pauses and symptomatic bradycardia. She declined a pacemaker.

## 2013-04-03 ENCOUNTER — Telehealth: Payer: Self-pay | Admitting: Hematology & Oncology

## 2013-04-03 NOTE — Telephone Encounter (Signed)
Pt showed up day early for appointment she moved 10-28 to 11-26. I offered her dates sooner but she wanted 11-26

## 2013-04-04 ENCOUNTER — Ambulatory Visit: Payer: Medicare Other

## 2013-04-04 ENCOUNTER — Other Ambulatory Visit: Payer: Medicare Other | Admitting: Lab

## 2013-04-04 ENCOUNTER — Ambulatory Visit: Payer: Medicare Other | Admitting: Hematology & Oncology

## 2013-04-21 ENCOUNTER — Ambulatory Visit
Admission: RE | Admit: 2013-04-21 | Discharge: 2013-04-21 | Disposition: A | Discharge status: Home / Self Care | Payer: Medicare Other | Source: Ambulatory Visit | Attending: Family Medicine | Admitting: Family Medicine

## 2013-04-21 ENCOUNTER — Other Ambulatory Visit: Payer: Self-pay | Admitting: Family Medicine

## 2013-04-21 DIAGNOSIS — R109 Unspecified abdominal pain: Secondary | ICD-10-CM

## 2013-04-21 DIAGNOSIS — R3129 Other microscopic hematuria: Secondary | ICD-10-CM

## 2013-05-01 ENCOUNTER — Encounter (HOSPITAL_COMMUNITY): Payer: Self-pay | Admitting: Emergency Medicine

## 2013-05-01 ENCOUNTER — Emergency Department (HOSPITAL_COMMUNITY): Payer: Medicare Other

## 2013-05-01 ENCOUNTER — Emergency Department (HOSPITAL_COMMUNITY)
Admission: EM | Admit: 2013-05-01 | Discharge: 2013-05-01 | Disposition: A | Discharge status: Home / Self Care | Payer: Medicare Other | Attending: Emergency Medicine | Admitting: Emergency Medicine

## 2013-05-01 DIAGNOSIS — M79609 Pain in unspecified limb: Secondary | ICD-10-CM | POA: Insufficient documentation

## 2013-05-01 DIAGNOSIS — Z880 Allergy status to penicillin: Secondary | ICD-10-CM | POA: Insufficient documentation

## 2013-05-01 DIAGNOSIS — Z7901 Long term (current) use of anticoagulants: Secondary | ICD-10-CM | POA: Insufficient documentation

## 2013-05-01 DIAGNOSIS — E079 Disorder of thyroid, unspecified: Secondary | ICD-10-CM | POA: Insufficient documentation

## 2013-05-01 DIAGNOSIS — Y92009 Unspecified place in unspecified non-institutional (private) residence as the place of occurrence of the external cause: Secondary | ICD-10-CM | POA: Insufficient documentation

## 2013-05-01 DIAGNOSIS — Z79899 Other long term (current) drug therapy: Secondary | ICD-10-CM | POA: Insufficient documentation

## 2013-05-01 DIAGNOSIS — I4891 Unspecified atrial fibrillation: Secondary | ICD-10-CM | POA: Insufficient documentation

## 2013-05-01 DIAGNOSIS — S0990XA Unspecified injury of head, initial encounter: Secondary | ICD-10-CM | POA: Insufficient documentation

## 2013-05-01 DIAGNOSIS — Z853 Personal history of malignant neoplasm of breast: Secondary | ICD-10-CM | POA: Insufficient documentation

## 2013-05-01 DIAGNOSIS — W010XXA Fall on same level from slipping, tripping and stumbling without subsequent striking against object, initial encounter: Secondary | ICD-10-CM | POA: Insufficient documentation

## 2013-05-01 DIAGNOSIS — Y939 Activity, unspecified: Secondary | ICD-10-CM | POA: Insufficient documentation

## 2013-05-01 DIAGNOSIS — W19XXXA Unspecified fall, initial encounter: Secondary | ICD-10-CM

## 2013-05-01 DIAGNOSIS — Z881 Allergy status to other antibiotic agents status: Secondary | ICD-10-CM | POA: Insufficient documentation

## 2013-05-01 LAB — BASIC METABOLIC PANEL
BUN: 15 mg/dL (ref 6–23)
CO2: 27 mEq/L (ref 19–32)
Calcium: 9.4 mg/dL (ref 8.4–10.5)
Chloride: 106 mEq/L (ref 96–112)
Creatinine, Ser: 0.89 mg/dL (ref 0.50–1.10)
GFR calc Af Amer: 67 mL/min — ABNORMAL LOW (ref >90)
GFR calc non Af Amer: 57 mL/min — ABNORMAL LOW (ref >90)
Glucose, Bld: 91 mg/dL (ref 70–99)
Potassium: 3.9 mEq/L (ref 3.5–5.1)
Sodium: 141 mEq/L (ref 135–145)

## 2013-05-01 LAB — PROTIME-INR
INR: 2.54 — ABNORMAL HIGH (ref 0.00–1.49)
Prothrombin Time: 26.5 seconds — ABNORMAL HIGH (ref 11.6–15.2)

## 2013-05-01 LAB — CBC
HCT: 38.6 % (ref 36.0–46.0)
Hemoglobin: 13.3 g/dL (ref 12.0–15.0)
MCH: 29.2 pg (ref 26.0–34.0)
MCHC: 34.5 g/dL (ref 30.0–36.0)
MCV: 84.6 fL (ref 78.0–100.0)
Platelets: 166 10*3/uL (ref 150–400)
RBC: 4.56 MIL/uL (ref 3.87–5.11)
RDW: 14 % (ref 11.5–15.5)
WBC: 4.8 10*3/uL (ref 4.0–10.5)

## 2013-05-01 LAB — POCT I-STAT TROPONIN I: Troponin i, poc: 0 ng/mL (ref 0.00–0.08)

## 2013-05-01 NOTE — ED Provider Notes (Signed)
79 year old female slipped on some wet leaves and fell against a landscaping better. She is complaining of pain in the proximal aspect of her left lower leg. She denies head injury or loss of consciousness, but she is taking warfarin. On exam, there is no obvious head injury. Neck is nontender. Back is nontender. Lungs are clear. Abdomen is soft and nontender. Pelvis is stable nontender. There is decreased range of motion of her left ankle status post arthrodesis but there is no swelling or deformity of the left lower leg and full range of motion is present at the knee. No other extremity injuries seen. CT is reported to be negative and INR is therapeutic. She will be discharged with cautious to watch for signs of delayed intracranial bleeding for the next 24-48 hours.  I saw and evaluated the patient, reviewed the resident's note and I agree with the findings and plan.   Delora Fuel, MD [phone]

## 2013-05-01 NOTE — ED Provider Notes (Signed)
CSN: [id number]     Arrival date & time 05/01/13  1906 History   First MD Initiated Contact with Patient 05/01/13 2014     Chief Complaint  Patient presents with  . Fall   (Consider location/radiation/quality/duration/timing/severity/associated sxs/prior Treatment) HPI Amy White is a 79 y.o. female who presents to the emergency department for concern of a fall.  Patient reports that approximately 1 hour PTA she tripped on some leaves  Past Medical History  Diagnosis Date  . Breast cancer metastasized to bone 05/13/2011  . Cancer   . Thyroid disease   . Atrial fibrillation    Past Surgical History  Procedure Laterality Date  . Breast surgery    . Leg surgery     History reviewed. No pertinent family history. History  Substance Use Topics  . Smoking status: Never Smoker   . Smokeless tobacco: Never Used  . Alcohol Use: Yes     Comment: ocassionally   OB History   Grav Para Term Preterm Abortions TAB SAB Ect Mult Living                 Review of Systems  Constitutional: Negative for fever and chills.  HENT: Negative for congestion and rhinorrhea.   Respiratory: Negative for cough and shortness of breath.   Cardiovascular: Negative for chest pain.  Gastrointestinal: Negative for nausea, vomiting, abdominal pain, diarrhea and abdominal distention.  Endocrine: Negative for polyuria.  Genitourinary: Negative for dysuria.  Musculoskeletal: Negative for neck pain and neck stiffness.  Skin: Negative for rash.  Neurological: Negative for headaches.  Psychiatric/Behavioral: Negative.     Allergies  Ciprofloxacin and Penicillins  Home Medications   Current Outpatient Rx  Name  Route  Sig  Dispense  Refill  . albuterol (PROVENTIL) (2.5 MG/3ML) 0.083% nebulizer solution   Nebulization   Take 2.5 mg by nebulization every 6 (six) hours as needed.           . ALPRAZolam (XANAX) 0.25 MG tablet   Oral   Take 0.5 mg by mouth at bedtime as needed.           Marland Kitchen  HYDROcodone-acetaminophen (NORCO/VICODIN) 5-325 MG per tablet   Oral   Take 1 tablet by mouth as needed.          Marland Kitchen letrozole (FEMARA) 2.5 MG tablet      TAKE 1 TABLET EVERY DAY   30 tablet   3   . levothyroxine (SYNTHROID, LEVOTHROID) 75 MCG tablet   Oral   Take 75 mg by mouth Daily.         . metoprolol succinate (TOPROL-XL) 25 MG 24 hr tablet      TAKE 1 TABLET ONCE DAILY.   30 tablet   2   . Niacin (VITAMIN B-3 PO)   Oral   Take 1,000 mg by mouth daily.           Marland Kitchen warfarin (COUMADIN) 5 MG tablet   Oral   Take 5 mg by mouth Daily. 2.5 mg. On Mon, Wed and Fri  & 5 mg all other days.         . Zoledronic Acid (ZOMETA) 4 MG/100ML IVPB   Intravenous   Inject 4 mg into the vein every 6 (six) weeks.          BP 168/57  Pulse 71  Temp(Src) 97.7 F (36.5 C) (Oral)  Resp 16  Ht 5\' 8"  (1.727 m)  Wt 171 lb 1.6 oz (77.61 kg)  BMI  26.02 kg/m2  SpO2 94% Physical Exam  Nursing note and vitals reviewed. Constitutional: She is oriented to person, place, and time. She appears well-developed and well-nourished. No distress.  HENT:  Head: Normocephalic and atraumatic.  Right Ear: External ear normal.  Left Ear: External ear normal.  Nose: Nose normal.  Mouth/Throat: Oropharynx is clear and moist. No oropharyngeal exudate.  Eyes: EOM are normal. Pupils are equal, round, and reactive to light.  Neck: Normal range of motion. Neck supple. No tracheal deviation present.  Cardiovascular: Normal rate.   Pulmonary/Chest: Effort normal and breath sounds normal. No stridor. No respiratory distress. She has no wheezes. She has no rales.  Abdominal: Soft. She exhibits no distension. There is no tenderness. There is no rebound.  Musculoskeletal: Normal range of motion.       Cervical back: She exhibits no bony tenderness.       Thoracic back: She exhibits no bony tenderness.       Lumbar back: She exhibits no bony tenderness.  All joints and extremities evaluated and free of  deformity or painful ROM.  2+ pulse in all extremities.  Neurological: She is alert and oriented to person, place, and time.  Skin: Skin is warm and dry. She is not diaphoretic.    ED Course  Procedures (including critical care time) Labs Review Labs Reviewed  PROTIME-INR - Abnormal; Notable for the following:    Prothrombin Time 26.5 (*)    INR 2.54 (*)    All other components within normal limits  BASIC METABOLIC PANEL - Abnormal; Notable for the following:    GFR calc non Af Amer 57 (*)    GFR calc Af Amer 67 (*)    All other components within normal limits  CBC  POCT I-STAT TROPONIN I   Imaging Review Ct Head Wo Contrast  05/01/2013   CLINICAL DATA:  Fall.  Pain along the right side of the head.  EXAM: CT HEAD WITHOUT CONTRAST  TECHNIQUE: Contiguous axial images were obtained from the base of the skull through the vertex without intravenous contrast.  COMPARISON:  07/11/2003.  FINDINGS: The brainstem, cerebellum, cerebral peduncles, thalamus, basal ganglia, basilar cisterns, and ventricular system appear within normal limits. No intracranial hemorrhage, mass lesion, or acute CVA. Minimal chronic right maxillary left ethmoid and sphenoid sinusitis.  IMPRESSION: 1. No acute intracranial findings. 2. Mild chronic paranasal sinusitis.   Electronically Signed   By: Sherryl Barters M.D.   On: 05/01/2013 20:22    EKG Interpretation   None       MDM   1. Fall, initial encounter    Amy White is a 79 y.o. female who presented to the ED for concern of a mechanical ground level fall where she hit her head.  No external signs of trauma on exam.  CT head negative.  No neck tenderness.  Patient at baseline and in no pain.  Return precautions discussed.  Patient safe for discharge home.  Patient discharged.    Rogelia Mire, MD 05/02/13 [phone]

## 2013-05-01 NOTE — ED Notes (Signed)
Patient presents stating about 4pm she fell on some wet leaves.  C/o pain to the left lower leg (no swelling noted)  And right side of head (small red area noted)  Denies LOC

## 2013-05-01 NOTE — ED Notes (Addendum)
Pt states she was out in the yard and slipped on wet leaves landing face down on her belly. Pt states she hit right side of head and both knees. Pt c/o pain on left leg. Slight swelling noted and tender to touch. Pt states she had no LOC just slight dizziness for a couple of minutes. Pt is on Warfarin

## 2013-05-02 ENCOUNTER — Ambulatory Visit: Payer: Medicare Other | Admitting: Pharmacist Clinician (PhC)/ Clinical Pharmacy Specialist

## 2013-05-03 ENCOUNTER — Other Ambulatory Visit (HOSPITAL_BASED_OUTPATIENT_CLINIC_OR_DEPARTMENT_OTHER): Payer: Medicare Other | Admitting: Lab

## 2013-05-03 ENCOUNTER — Ambulatory Visit (HOSPITAL_BASED_OUTPATIENT_CLINIC_OR_DEPARTMENT_OTHER): Payer: Medicare Other | Admitting: Hematology & Oncology

## 2013-05-03 ENCOUNTER — Ambulatory Visit (HOSPITAL_BASED_OUTPATIENT_CLINIC_OR_DEPARTMENT_OTHER): Payer: Medicare Other

## 2013-05-03 VITALS — BP 127/55 | HR 62 | Temp 98.0°F | Resp 14 | Ht 68.0 in | Wt 168.0 lb

## 2013-05-03 DIAGNOSIS — C7951 Secondary malignant neoplasm of bone: Secondary | ICD-10-CM

## 2013-05-03 DIAGNOSIS — C50911 Malignant neoplasm of unspecified site of right female breast: Secondary | ICD-10-CM

## 2013-05-03 DIAGNOSIS — C50919 Malignant neoplasm of unspecified site of unspecified female breast: Secondary | ICD-10-CM

## 2013-05-03 LAB — CBC WITH DIFFERENTIAL (CANCER CENTER ONLY)
BASO#: 0 10*3/uL (ref 0.0–0.2)
BASO%: 0.9 % (ref 0.0–2.0)
EOS%: 5.8 % (ref 0.0–7.0)
Eosinophils Absolute: 0.2 10*3/uL (ref 0.0–0.5)
HCT: 37.2 % (ref 34.8–46.6)
HGB: 12.3 g/dL (ref 11.6–15.9)
LYMPH#: 1.2 10*3/uL (ref 0.9–3.3)
LYMPH%: 36.9 % (ref 14.0–48.0)
MCH: 28.4 pg (ref 26.0–34.0)
MCHC: 33.1 g/dL (ref 32.0–36.0)
MCV: 86 fL (ref 81–101)
MONO#: 0.4 10*3/uL (ref 0.1–0.9)
MONO%: 10.8 % (ref 0.0–13.0)
NEUT#: 1.5 10*3/uL (ref 1.5–6.5)
NEUT%: 45.6 % (ref 39.6–80.0)
Platelets: 147 10*3/uL (ref 145–400)
RBC: 4.33 10*6/uL (ref 3.70–5.32)
RDW: 14 % (ref 11.1–15.7)
WBC: 3.3 10*3/uL — ABNORMAL LOW (ref 3.9–10.0)

## 2013-05-03 LAB — CMP (CANCER CENTER ONLY)
ALT(SGPT): 16 U/L (ref 10–47)
AST: 22 U/L (ref 11–38)
Albumin: 3.4 g/dL (ref 3.3–5.5)
Alkaline Phosphatase: 57 U/L (ref 26–84)
BUN, Bld: 13 mg/dL (ref 7–22)
CO2: 29 mEq/L (ref 18–33)
Calcium: 8.9 mg/dL (ref 8.0–10.3)
Chloride: 104 mEq/L (ref 98–108)
Creat: 1.1 mg/dl (ref 0.6–1.2)
Glucose, Bld: 80 mg/dL (ref 73–118)
Potassium: 4.3 mEq/L (ref 3.3–4.7)
Sodium: 140 mEq/L (ref 128–145)
Total Bilirubin: 1 mg/dl (ref 0.20–1.60)
Total Protein: 6.6 g/dL (ref 6.4–8.1)

## 2013-05-03 MED ORDER — ZOLEDRONIC ACID 4 MG/100ML IV SOLN
4.0000 mg | Freq: Once | INTRAVENOUS | Status: AC
  Administered 2013-05-03: 4 mg via INTRAVENOUS
  Filled 2013-05-03: qty 100

## 2013-05-03 NOTE — Progress Notes (Signed)
This office note has been dictated.

## 2013-05-03 NOTE — Patient Instructions (Signed)

## 2013-05-04 LAB — CANCER ANTIGEN 27.29: CA 27.29: 39 U/mL (ref 0–39)

## 2013-05-13 NOTE — Progress Notes (Signed)
CC:   Sanda Klein, MD Mayra Neer, M.D.  DIAGNOSIS:  Metastatic breast cancer, bone metastasis only.  CURRENT THERAPY: 1. Femara 2.5 mg p.o. daily. 2. Zometa 4 mg IV q.3 months.  INTERVAL HISTORY:  Ms. Newvine comes in for followup.  She is doing quite well.  She has had no problems since we last saw her.  She reports that she did have a fall on Monday.  She did have a brain scan done because she is on Coumadin.  Thankfully, the brain scan did not show any evidence of bleeding.  She did have a CT of the abdomen and pelvis 2 weeks ago.  This was because there was some flank pain and hematuria.  No obvious stones were noted.  She had a compression fracture at T-12, L-3, and L-4, which were noted previously.  There ere some scattered sclerotic lesions compatible with her treated breast cancer.  Again, her last CA 27-29 was 40 back in July.  She has had no cough or shortness of breath.  She has had no change in bowel or bladder habits.  There has been no bleeding while on Coumadin. She has had no headache.  PHYSICAL EXAMINATION:  General:  This is an elderly-appearing white female, in no obvious distress.  Vital Signs:  Temperature of 98, pulse 62, respiratory rate 14, blood pressure 127/55, weight is 168 pounds. Head and Neck:  Normocephalic, atraumatic skull.  There are no ocular or oral lesions.  There are no palpable cervical or supraclavicular lymph nodes.  Lungs:  Clear bilaterally.  Cardiac:  Regular rate and rhythm with occasional extra beat.  She has no murmurs, rubs, or bruits. Abdomen:  Soft.  She has good bowel sounds.  There is no fluid wave. There is no palpable abdominal mass.  There is no palpable hepatosplenomegaly.  Back:  Shows some osteoporotic changes in her spine, ribs, or hips.  No tenderness is noted over the spine, ribs, or hips.  Extremities:  Shows some osteoarthritic changes in her joints. She has some chronic age-related muscle atrophy in the  upper and lower extremities.  Muscle strength is symmetric.  Skin:  No rashes, ecchymosis, or petechia.  Neurological:  No focal neurological deficits.  LABORATORY STUDIES:  White cell count 3.3, hemoglobin 12.3, hematocrit 37.2, platelet count 147.  BUN 13, creatinine 1.1.  Calcium 8.9 with an albumin of 3.4.  IMPRESSION:  Amy White is a very charming 79 year old white female. She has metastatic breast cancer.  So far, her disease has been only confined to the bones.  We have been seeing her now for 8 years or longer.  She has had stable disease from my point of view.  We will go ahead and plan for the Zometa.  We do this every 3 months.  I believe that this has to be helping [place].  I will plan to see her back in 3 more months.    ______________________________ Volanda Napoleon, M.D. PRE/MEDQ  D:  05/03/2013  T:  05/12/2013  Job:  [id number]

## 2013-05-23 ENCOUNTER — Other Ambulatory Visit: Payer: Self-pay | Admitting: Pharmacist Clinician (PhC)/ Clinical Pharmacy Specialist

## 2013-05-23 DIAGNOSIS — C50919 Malignant neoplasm of unspecified site of unspecified female breast: Secondary | ICD-10-CM

## 2013-05-23 MED ORDER — WARFARIN SODIUM 5 MG PO TABS: ORAL_TABLET | ORAL | Status: DC

## 2013-06-13 ENCOUNTER — Telehealth: Payer: Self-pay | Admitting: Hematology & Oncology

## 2013-06-13 NOTE — Telephone Encounter (Signed)
Patient called and changed 08/03/13 apt time from 9:30am to 1:15pm

## 2013-06-14 ENCOUNTER — Ambulatory Visit (INDEPENDENT_AMBULATORY_CARE_PROVIDER_SITE_OTHER): Payer: Medicare Other | Admitting: Pharmacist Clinician (PhC)/ Clinical Pharmacy Specialist

## 2013-06-14 VITALS — BP 136/68 | HR 60

## 2013-06-14 DIAGNOSIS — I4891 Unspecified atrial fibrillation: Secondary | ICD-10-CM

## 2013-06-14 DIAGNOSIS — Z7901 Long term (current) use of anticoagulants: Secondary | ICD-10-CM

## 2013-06-14 LAB — POCT INR: INR: 2.6

## 2013-07-04 ENCOUNTER — Other Ambulatory Visit: Payer: Self-pay | Admitting: Pharmacist Clinician (PhC)/ Clinical Pharmacy Specialist

## 2013-07-26 ENCOUNTER — Ambulatory Visit: Payer: Medicare Other | Admitting: Pharmacist Clinician (PhC)/ Clinical Pharmacy Specialist

## 2013-07-27 ENCOUNTER — Ambulatory Visit (INDEPENDENT_AMBULATORY_CARE_PROVIDER_SITE_OTHER): Payer: Medicare Other | Admitting: Pharmacist Clinician (PhC)/ Clinical Pharmacy Specialist

## 2013-07-27 VITALS — BP 142/70 | HR 72

## 2013-07-27 DIAGNOSIS — Z7901 Long term (current) use of anticoagulants: Secondary | ICD-10-CM

## 2013-07-27 DIAGNOSIS — I4891 Unspecified atrial fibrillation: Secondary | ICD-10-CM

## 2013-07-27 LAB — POCT INR: INR: 1.9

## 2013-08-03 ENCOUNTER — Other Ambulatory Visit: Payer: Medicare Other | Admitting: Lab

## 2013-08-03 ENCOUNTER — Ambulatory Visit: Payer: Medicare Other | Admitting: Hematology & Oncology

## 2013-08-03 ENCOUNTER — Ambulatory Visit: Payer: Medicare Other

## 2013-08-16 ENCOUNTER — Telehealth: Payer: Self-pay | Admitting: Hematology & Oncology

## 2013-08-16 NOTE — Telephone Encounter (Signed)
Left pt message with 3-12 appointment

## 2013-08-17 ENCOUNTER — Ambulatory Visit (HOSPITAL_BASED_OUTPATIENT_CLINIC_OR_DEPARTMENT_OTHER): Payer: Medicare Other | Admitting: Hematology & Oncology

## 2013-08-17 ENCOUNTER — Ambulatory Visit (HOSPITAL_BASED_OUTPATIENT_CLINIC_OR_DEPARTMENT_OTHER): Payer: Medicare Other

## 2013-08-17 ENCOUNTER — Encounter: Payer: Self-pay | Admitting: Hematology & Oncology

## 2013-08-17 ENCOUNTER — Other Ambulatory Visit (HOSPITAL_BASED_OUTPATIENT_CLINIC_OR_DEPARTMENT_OTHER): Payer: Medicare Other | Admitting: Lab

## 2013-08-17 VITALS — BP 119/49 | HR 60 | Temp 97.8°F | Resp 14 | Ht 66.0 in | Wt 168.0 lb

## 2013-08-17 DIAGNOSIS — R1011 Right upper quadrant pain: Secondary | ICD-10-CM

## 2013-08-17 DIAGNOSIS — C50919 Malignant neoplasm of unspecified site of unspecified female breast: Secondary | ICD-10-CM

## 2013-08-17 DIAGNOSIS — C7951 Secondary malignant neoplasm of bone: Secondary | ICD-10-CM

## 2013-08-17 DIAGNOSIS — R109 Unspecified abdominal pain: Secondary | ICD-10-CM

## 2013-08-17 DIAGNOSIS — C7952 Secondary malignant neoplasm of bone marrow: Secondary | ICD-10-CM

## 2013-08-17 DIAGNOSIS — C50911 Malignant neoplasm of unspecified site of right female breast: Secondary | ICD-10-CM

## 2013-08-17 LAB — CBC WITH DIFFERENTIAL (CANCER CENTER ONLY)
BASO#: 0 10*3/uL (ref 0.0–0.2)
BASO%: 0.6 % (ref 0.0–2.0)
EOS%: 4.4 % (ref 0.0–7.0)
Eosinophils Absolute: 0.1 10*3/uL (ref 0.0–0.5)
HCT: 36.5 % (ref 34.8–46.6)
HGB: 12.1 g/dL (ref 11.6–15.9)
LYMPH#: 1.1 10*3/uL (ref 0.9–3.3)
LYMPH%: 34.4 % (ref 14.0–48.0)
MCH: 28.8 pg (ref 26.0–34.0)
MCHC: 33.2 g/dL (ref 32.0–36.0)
MCV: 87 fL (ref 81–101)
MONO#: 0.4 10*3/uL (ref 0.1–0.9)
MONO%: 10.9 % (ref 0.0–13.0)
NEUT#: 1.6 10*3/uL (ref 1.5–6.5)
NEUT%: 49.7 % (ref 39.6–80.0)
Platelets: 146 10*3/uL (ref 145–400)
RBC: 4.2 10*6/uL (ref 3.70–5.32)
RDW: 14 % (ref 11.1–15.7)
WBC: 3.2 10*3/uL — ABNORMAL LOW (ref 3.9–10.0)

## 2013-08-17 LAB — CMP (CANCER CENTER ONLY)
ALT(SGPT): 10 U/L (ref 10–47)
AST: 18 U/L (ref 11–38)
Albumin: 3.3 g/dL (ref 3.3–5.5)
Alkaline Phosphatase: 50 U/L (ref 26–84)
BUN, Bld: 15 mg/dL (ref 7–22)
CO2: 29 mEq/L (ref 18–33)
Calcium: 8.6 mg/dL (ref 8.0–10.3)
Chloride: 108 mEq/L (ref 98–108)
Creat: 1 mg/dl (ref 0.6–1.2)
Glucose, Bld: 94 mg/dL (ref 73–118)
Potassium: 4.2 mEq/L (ref 3.3–4.7)
Sodium: 138 mEq/L (ref 128–145)
Total Bilirubin: 1.1 mg/dl (ref 0.20–1.60)
Total Protein: 6.5 g/dL (ref 6.4–8.1)

## 2013-08-17 LAB — URINALYSIS, MICROSCOPIC (CHCC SATELLITE)
Bilirubin (Urine): NEGATIVE
Glucose: NEGATIVE mg/dL
Ketones: NEGATIVE mg/dL
Leukocyte Esterase: NEGATIVE
Nitrite: NEGATIVE
Protein: NEGATIVE mg/dL
Specific Gravity, Urine: 1.02 (ref 1.003–1.035)
Urobilinogen, UR: 0.2 mg/dL (ref 0.2–1)
pH: 6 (ref 4.60–8.00)

## 2013-08-17 LAB — CANCER ANTIGEN 27.29: CA 27.29: 36 U/mL (ref 0–39)

## 2013-08-17 MED ORDER — SODIUM CHLORIDE 0.9 % IV SOLN: Freq: Once | INTRAVENOUS | Status: DC

## 2013-08-17 MED ORDER — ZOLEDRONIC ACID 4 MG/100ML IV SOLN
4.0000 mg | Freq: Once | INTRAVENOUS | Status: AC
  Administered 2013-08-17: 4 mg via INTRAVENOUS
  Filled 2013-08-17: qty 100

## 2013-08-17 NOTE — Patient Instructions (Signed)

## 2013-08-17 NOTE — Progress Notes (Signed)
  DIAGNOSIS: Metastatic breast cancer, bone metastasis only.   CURRENT THERAPY: 1. Femara 2.5 mg p.o. daily. 2. Zometa 4 mg IV q.3 months.   INTERIM HISTORY:  Amy White comes in for followup. She is doing well. She is complaining of pain in the right upper quadrant. This has been going on for a couple weeks.    When we last saw her back in November, her CA 27.29 was stable at 51.    She has gone through the wintertime. She's had no cardiac issues. She's had no problems with respect to Coumadin. She's had no bleeding. She's had no change in bowel or bladder habits. Her appetite is good. She's had no cough or shortness of breath.   PHYSICAL EXAMINATION: Elderly but well nourished white female in no obvious distress. Vital signs are temperature of 97.8. Pulse 60. Blood pressure 119/49. Weight is 168 pounds. Head and neck exam shows no ocular or oral lesions. There is no adenopathy in the neck. Lungs are clear. Cardiac exam regular rhythm with no murmurs rubs or bruits. Abdomen soft. There may be some slight tenderness in the right upper quadrant. There is no palpable liver edge. There is no fluid wave. Spleen is non-palpable. Back exam shows some kyphosis. No tenderness noted over the spine. This and tenderness over the right posterior ribs. Extremities shows severe arthritic changes. She has made related muscle atrophy. She has good strength. Skin exam no rashes. She has some seborrheic keratoses on her back. Neurological exam no focal neurological deficits.    LABORATORY STUDIES:  White cell count 3.2. Hemoglobin 12.1. Pletal 146. Liver function tests are normal.   IMPRESSION:  Amy White is an 80 year old white female with metastatic breast cancer. We have been following her now for 9 years or so. She's done incredibly well. We've never had to change treatment on her.    We will go ahead and repeat a bone scan. I will also get an ultrasound of her abdomen.    I think it  would be unusual for her to have systemic parenchymal disease.    We will do her Zometa today.    I also want to make sure I get a urine culture and urinalysis on her.    We'll plan to get her back to see [place] in 3 more months if all she is to do well.   Volanda Napoleon, MD 08/17/2013

## 2013-08-18 LAB — URINE CULTURE

## 2013-08-22 ENCOUNTER — Telehealth: Payer: Self-pay | Admitting: *Deleted

## 2013-08-22 ENCOUNTER — Other Ambulatory Visit: Payer: Self-pay | Admitting: *Deleted

## 2013-08-22 MED ORDER — METOPROLOL SUCCINATE ER 25 MG PO TB24: 25.0000 mg | ORAL_TABLET | Freq: Every day | ORAL | Status: DC

## 2013-08-22 NOTE — Telephone Encounter (Signed)
Message copied by Rico Ala on Tue Aug 22, 2013  1:40 PM ------      Message from: Volanda Napoleon      Created: Sun Aug 20, 2013  8:08 PM       Call - urine culture is (-).  pete ------

## 2013-08-22 NOTE — Telephone Encounter (Signed)
Called patient that urine culture is negative

## 2013-08-23 ENCOUNTER — Encounter (HOSPITAL_COMMUNITY): Payer: Self-pay | Admitting: Emergency Medicine

## 2013-08-23 ENCOUNTER — Emergency Department (INDEPENDENT_AMBULATORY_CARE_PROVIDER_SITE_OTHER): Payer: Medicare Other

## 2013-08-23 ENCOUNTER — Emergency Department (HOSPITAL_COMMUNITY)
Admission: EM | Admit: 2013-08-23 | Discharge: 2013-08-23 | Disposition: A | Discharge status: Home / Self Care | Payer: Medicare Other | Source: Home / Self Care | Attending: Family Medicine | Admitting: Family Medicine

## 2013-08-23 DIAGNOSIS — I4891 Unspecified atrial fibrillation: Secondary | ICD-10-CM

## 2013-08-23 DIAGNOSIS — C7951 Secondary malignant neoplasm of bone: Secondary | ICD-10-CM

## 2013-08-23 DIAGNOSIS — J40 Bronchitis, not specified as acute or chronic: Secondary | ICD-10-CM

## 2013-08-23 DIAGNOSIS — C7952 Secondary malignant neoplasm of bone marrow: Secondary | ICD-10-CM

## 2013-08-23 DIAGNOSIS — C50919 Malignant neoplasm of unspecified site of unspecified female breast: Secondary | ICD-10-CM

## 2013-08-23 MED ORDER — IPRATROPIUM-ALBUTEROL 0.5-2.5 (3) MG/3ML IN SOLN
3.0000 mL | Freq: Once | RESPIRATORY_TRACT | Status: AC
  Administered 2013-08-23: 3 mL via RESPIRATORY_TRACT

## 2013-08-23 MED ORDER — GUAIFENESIN-CODEINE 100-10 MG/5ML PO SOLN: 5.0000 mL | Freq: Every evening | ORAL | Status: DC | PRN

## 2013-08-23 MED ORDER — AZITHROMYCIN 250 MG PO TABS: 250.0000 mg | ORAL_TABLET | Freq: Every day | ORAL | Status: DC

## 2013-08-23 MED ORDER — ALBUTEROL SULFATE HFA 108 (90 BASE) MCG/ACT IN AERS: 2.0000 | INHALATION_SPRAY | Freq: Four times a day (QID) | RESPIRATORY_TRACT | Status: DC | PRN

## 2013-08-23 MED ORDER — PREDNISONE 20 MG PO TABS: 40.0000 mg | ORAL_TABLET | Freq: Every day | ORAL | Status: DC

## 2013-08-23 MED ORDER — IPRATROPIUM-ALBUTEROL 0.5-2.5 (3) MG/3ML IN SOLN
RESPIRATORY_TRACT | Status: AC
  Filled 2013-08-23: qty 3

## 2013-08-23 NOTE — ED Provider Notes (Signed)
Amy White is a 80 y.o. female who presents to Urgent Care today for left-sided chest pain. Patient notes acute onset of sharp left-sided chest pain starting this morning at 8:00. The pain has persisted all day. The pain radiates to the left axilla. It is sometimes associated with palpitations and shortness of breath. She denies any nausea vomiting or diarrhea. The pain is not very exertional in nature. The pain is worse with deep inspiration and coughing. She has not tried any medications. She did not attempt to contact her primary care Dr. Her medical history is concerning for metastatic breast cancer and paroxysmal atrial fibrillation.   Past Medical History  Diagnosis Date  . Breast cancer metastasized to bone 05/13/2011  . Cancer   . Thyroid disease   . Atrial fibrillation    History  Substance Use Topics  . Smoking status: Never Smoker   . Smokeless tobacco: Never Used     Comment: never used tobacco  . Alcohol Use: Yes     Comment: ocassionally   ROS as above Medications: No current facility-administered medications for this encounter.   Current Outpatient Prescriptions  Medication Sig Dispense Refill  . letrozole (FEMARA) 2.5 MG tablet TAKE 1 TABLET EVERY DAY  30 tablet  3  . levothyroxine (SYNTHROID, LEVOTHROID) 75 MCG tablet Take 75 mg by mouth Daily.      . metoprolol succinate (TOPROL-XL) 25 MG 24 hr tablet Take 1 tablet (25 mg total) by mouth daily.  30 tablet  4  . warfarin (COUMADIN) 5 MG tablet TAKE 1 TABLET BY MOUTH DAILY OR AS DIRECTED  30 tablet  5  . albuterol (PROVENTIL) (2.5 MG/3ML) 0.083% nebulizer solution Take 2.5 mg by nebulization every 6 (six) hours as needed.        . ALPRAZolam (XANAX) 0.25 MG tablet Take 0.5 mg by mouth at bedtime as needed.        Marland Kitchen HYDROcodone-acetaminophen (NORCO/VICODIN) 5-325 MG per tablet Take 1 tablet by mouth as needed.       . Niacin (VITAMIN B-3 PO) Take 1,000 mg by mouth daily.        . Zoledronic Acid (ZOMETA) 4 MG/100ML  IVPB Inject 4 mg into the vein every 6 (six) weeks.        Exam:  BP 150/57  Pulse 66  Temp(Src) 97.5 F (36.4 C) (Oral)  Resp 16  SpO2 97% Gen: Well NAD HEENT: EOMI,  MMM Lungs: Normal work of breathing. Wheezing and coarse breath sounds present bilaterally Left axilla: Mildly swollen no masses palpated. Chest wall is tender to palpation on the left side with no crepitations palpated. Heart: RRR no MRG Abd: NABS, Soft. NT, ND Exts: Brisk capillary refill, warm and well perfused.   Patient was given a DuoNeb nebulizer inhaler, and felt much better. Her pain resolved.  Twelve-lead EKG shows normal sinus rhythm at 66 beats per minute. Decreased voltage V6  No results found for this or any previous visit (from the past 24 hour(s)). Dg Chest 2 View  08/23/2013   CLINICAL DATA:  Pleuritic left chest pain  EXAM: CHEST  2 VIEW  COMPARISON:  03/17/2011  FINDINGS: Mild enlargement of cardiac silhouette.  Mediastinal contours and pulmonary vascularity normal.  Emphysematous and bronchitic changes consistent with COPD.  No acute infiltrate, pleural effusion or pneumothorax.  Bones demineralized.  Prior left mastectomy and axillary node dissection.  Diffuse osseous demineralization with chronic compression deformity of T12 vertebral body.  The  IMPRESSION: COPD changes.  No acute abnormalities.  Chronic T12 compression fracture.   Electronically Signed   By: Lavonia Dana M.D.   On: 08/23/2013 20:22    Assessment and Plan: 80 y.o. female with pleuritic-type chest pain likely related to COPD exacerbation. Alternative diagnosis is metastatic bone pain secondary to her metastatic breast cancer.  She certainly had improvement with nebulizer treatment.  Her EKG was also normal. Cardiac etiology is very unlikely based on this. Discussed options with patient including shared decision-making. Plan to treat outpatient and with prompt followup with primary care provider.  Plan to use prednisone,  azithromycin, and codeine containing cough medication.  Discussed warning signs or symptoms. Please see discharge instructions. Patient expresses understanding.    Gregor Hams, MD 08/23/13 [phone]

## 2013-08-23 NOTE — ED Notes (Signed)
Dr. Georgina Snell is in the room w/the pt.  Pt c/o intermittent chest pain onset 0800 today Reports pain is "sharp, stabbing that will bring her down to her knees" Sxs also include: palpations and SOB Hx of Afib Alert w/no signs of acute distress.

## 2013-08-23 NOTE — Discharge Instructions (Signed)
Thank you for coming in today. Please get your warfarin level rechecked early next week.  Take prednisone and azithromycin daily for 5 days Use albuterol and codeine containing cough medication as needed. Followup with your Dr. as soon as possible.  To the emergency room for chest pain worsens.  Call or go to the emergency room if you get worse, have trouble breathing, have chest pains, or palpitations.   Bronchitis Bronchitis is inflammation of the airways that extend from the windpipe into the lungs (bronchi). The inflammation often causes mucus to develop, which leads to a cough. If the inflammation becomes severe, it may cause shortness of breath. CAUSES  Bronchitis may be caused by:   Viral infections.   Bacteria.   Cigarette smoke.   Allergens, pollutants, and other irritants.  SIGNS AND SYMPTOMS  The most common symptom of bronchitis is a frequent cough that produces mucus. Other symptoms include:  Fever.   Body aches.   Chest congestion.   Chills.   Shortness of breath.   Sore throat.  DIAGNOSIS  Bronchitis is usually diagnosed through a medical history and physical exam. Tests, such as chest X-rays, are sometimes done to rule out other conditions.  TREATMENT  You may need to avoid contact with whatever caused the problem (smoking, for example). Medicines are sometimes needed. These may include:  Antibiotics. These may be prescribed if the condition is caused by bacteria.  Cough suppressants. These may be prescribed for relief of cough symptoms.   Inhaled medicines. These may be prescribed to help open your airways and make it easier for you to breathe.   Steroid medicines. These may be prescribed for those with recurrent (chronic) bronchitis. HOME CARE INSTRUCTIONS  Get plenty of rest.   Drink enough fluids to keep your urine clear or pale yellow (unless you have a medical condition that requires fluid restriction). Increasing fluids may help  thin your secretions and will prevent dehydration.   Only take over-the-counter or prescription medicines as directed by your health care provider.  Only take antibiotics as directed. Make sure you finish them even if you start to feel better.  Avoid secondhand smoke, irritating chemicals, and strong fumes. These will make bronchitis worse. If you are a smoker, quit smoking. Consider using nicotine gum or skin patches to help control withdrawal symptoms. Quitting smoking will help your lungs heal faster.   Put a cool-mist humidifier in your bedroom at night to moisten the air. This may help loosen mucus. Change the water in the humidifier daily. You can also run the hot water in your shower and sit in the bathroom with the door closed for 5 10 minutes.   Follow up with your health care provider as directed.   Wash your hands frequently to avoid catching bronchitis again or spreading an infection to others.  SEEK MEDICAL CARE IF: Your symptoms do not improve after 1 week of treatment.  SEEK IMMEDIATE MEDICAL CARE IF:  Your fever increases.  You have chills.   You have chest pain.   You have worsening shortness of breath.   You have bloody sputum.  You faint.  You have lightheadedness.  You have a severe headache.   You vomit repeatedly. MAKE SURE YOU:   Understand these instructions.  Will watch your condition.  Will get help right away if you are not doing well or get worse. Document Released: 05/25/2005 Document Revised: 03/15/2013 Document Reviewed: 01/17/2013 [facility] Patient Information 2014 [place].

## 2013-08-24 ENCOUNTER — Encounter (HOSPITAL_COMMUNITY): Payer: Medicare Other

## 2013-08-24 ENCOUNTER — Ambulatory Visit (HOSPITAL_COMMUNITY)
Admission: RE | Admit: 2013-08-24 | Discharge: 2013-08-24 | Disposition: A | Discharge status: Home / Self Care | Payer: Medicare Other | Source: Ambulatory Visit | Attending: Hematology & Oncology | Admitting: Hematology & Oncology

## 2013-08-24 ENCOUNTER — Encounter (HOSPITAL_COMMUNITY)
Admission: RE | Admit: 2013-08-24 | Discharge: 2013-08-24 | Disposition: A | Discharge status: Home / Self Care | Payer: Medicare Other | Source: Ambulatory Visit | Attending: Hematology & Oncology | Admitting: Hematology & Oncology

## 2013-08-24 DIAGNOSIS — R1011 Right upper quadrant pain: Secondary | ICD-10-CM | POA: Insufficient documentation

## 2013-08-24 DIAGNOSIS — C7951 Secondary malignant neoplasm of bone: Secondary | ICD-10-CM | POA: Insufficient documentation

## 2013-08-24 DIAGNOSIS — C7952 Secondary malignant neoplasm of bone marrow: Secondary | ICD-10-CM

## 2013-08-24 DIAGNOSIS — R109 Unspecified abdominal pain: Secondary | ICD-10-CM | POA: Insufficient documentation

## 2013-08-24 DIAGNOSIS — K8020 Calculus of gallbladder without cholecystitis without obstruction: Secondary | ICD-10-CM | POA: Insufficient documentation

## 2013-08-24 DIAGNOSIS — C50919 Malignant neoplasm of unspecified site of unspecified female breast: Secondary | ICD-10-CM

## 2013-08-24 MED ORDER — TECHNETIUM TC 99M MEDRONATE IV KIT
26.8000 | PACK | Freq: Once | INTRAVENOUS | Status: AC | PRN
  Administered 2013-08-24: 26.8 via INTRAVENOUS

## 2013-08-29 ENCOUNTER — Telehealth: Payer: Self-pay | Admitting: *Deleted

## 2013-08-29 NOTE — Telephone Encounter (Addendum)
Message copied by Lenn Sink on Tue Aug 29, 2013  3:16 PM ------      Message from: Volanda Napoleon      Created: Fri Aug 25, 2013  6:30 AM       Call - NO new bone metastasis!!  You do have gallstones.  May need to get your family doctor to send you to a gastroenterologist.  Laurey Arrow ------Left voicemail notifying patient there is no new metasisis, but she does have gallstones.

## 2013-08-29 NOTE — Telephone Encounter (Deleted)
Message copied by Lenn Sink on Tue Aug 29, 2013  3:10 PM ------      Message from: Volanda Napoleon      Created: Fri Aug 25, 2013  6:30 AM       Call - NO new bone metastasis!!  You do have gallstones.  May need to get your family doctor to send you to a gastroenterologist.  Laurey Arrow ------

## 2013-09-07 ENCOUNTER — Ambulatory Visit: Payer: Medicare Other | Admitting: Pharmacist Clinician (PhC)/ Clinical Pharmacy Specialist

## 2013-09-08 ENCOUNTER — Ambulatory Visit (INDEPENDENT_AMBULATORY_CARE_PROVIDER_SITE_OTHER): Payer: Medicare Other | Admitting: Pharmacist Clinician (PhC)/ Clinical Pharmacy Specialist

## 2013-09-08 DIAGNOSIS — Z7901 Long term (current) use of anticoagulants: Secondary | ICD-10-CM

## 2013-09-08 DIAGNOSIS — I4891 Unspecified atrial fibrillation: Secondary | ICD-10-CM

## 2013-09-08 LAB — POCT INR: INR: 3.1

## 2013-09-19 ENCOUNTER — Other Ambulatory Visit: Payer: Self-pay | Admitting: Hematology & Oncology

## 2013-10-18 ENCOUNTER — Ambulatory Visit (INDEPENDENT_AMBULATORY_CARE_PROVIDER_SITE_OTHER): Payer: Medicare Other | Admitting: Pharmacist Clinician (PhC)/ Clinical Pharmacy Specialist

## 2013-10-18 DIAGNOSIS — I4891 Unspecified atrial fibrillation: Secondary | ICD-10-CM

## 2013-10-18 DIAGNOSIS — Z7901 Long term (current) use of anticoagulants: Secondary | ICD-10-CM

## 2013-10-18 LAB — POCT INR: INR: 2

## 2013-11-12 ENCOUNTER — Emergency Department (INDEPENDENT_AMBULATORY_CARE_PROVIDER_SITE_OTHER): Payer: Medicare Other

## 2013-11-12 ENCOUNTER — Encounter (HOSPITAL_COMMUNITY): Payer: Self-pay | Admitting: Emergency Medicine

## 2013-11-12 ENCOUNTER — Emergency Department (HOSPITAL_COMMUNITY)
Admission: EM | Admit: 2013-11-12 | Discharge: 2013-11-12 | Disposition: A | Discharge status: Home / Self Care | Payer: Medicare Other | Source: Home / Self Care | Attending: Family Medicine | Admitting: Family Medicine

## 2013-11-12 DIAGNOSIS — J441 Chronic obstructive pulmonary disease with (acute) exacerbation: Secondary | ICD-10-CM

## 2013-11-12 MED ORDER — IPRATROPIUM-ALBUTEROL 0.5-2.5 (3) MG/3ML IN SOLN
RESPIRATORY_TRACT | Status: AC
  Filled 2013-11-12: qty 3

## 2013-11-12 MED ORDER — IPRATROPIUM-ALBUTEROL 0.5-2.5 (3) MG/3ML IN SOLN
3.0000 mL | Freq: Once | RESPIRATORY_TRACT | Status: AC
  Administered 2013-11-12: 3 mL via RESPIRATORY_TRACT

## 2013-11-12 MED ORDER — AZITHROMYCIN 250 MG PO TABS: 250.0000 mg | ORAL_TABLET | Freq: Every day | ORAL | Status: DC

## 2013-11-12 MED ORDER — PREDNISONE 50 MG PO TABS: 50.0000 mg | ORAL_TABLET | Freq: Every day | ORAL | Status: DC

## 2013-11-12 NOTE — ED Notes (Signed)
C/o  Productive cough with green/yellow sputum that has streaks of blood.   Upper centralized back pain.  Pain is worse with deep breathing and coughing.   Denies fever and any other symptoms.   No otc meds taken.  Symptoms present since Tuesday.

## 2013-11-12 NOTE — ED Provider Notes (Signed)
Amy White is a 80 y.o. female who presents to Urgent Care today for cough. Patient has 5 days of worsening voice and productive cough. The cough is productive of yellowish sputum. She denies any significant shortness of breath. Yesterday she developed pain in the posterior left side of her ribs. The pain is worse with deep inspiration and better with rest. She denies any exertional pain. She denies any fevers or chills nausea vomiting or diarrhea. Her symptoms are consistent with prior episodes of bronchitis or asthma exacerbation.  Medical history significant for metastatic breast cancer.   Past Medical History  Diagnosis Date  . Breast cancer metastasized to bone 05/13/2011  . Cancer   . Thyroid disease   . Atrial fibrillation    History  Substance Use Topics  . Smoking status: Never Smoker   . Smokeless tobacco: Never Used     Comment: never used tobacco  . Alcohol Use: Yes     Comment: ocassionally   ROS as above Medications: No current facility-administered medications for this encounter.   Current Outpatient Prescriptions  Medication Sig Dispense Refill  . ALPRAZolam (XANAX) 0.25 MG tablet Take 0.5 mg by mouth at bedtime as needed.        Marland Kitchen letrozole (FEMARA) 2.5 MG tablet TAKE 1 TABLET BY MOUTH EVERY DAY  30 tablet  3  . levothyroxine (SYNTHROID, LEVOTHROID) 75 MCG tablet Take 75 mg by mouth Daily.      . metoprolol succinate (TOPROL-XL) 25 MG 24 hr tablet Take 1 tablet (25 mg total) by mouth daily.  30 tablet  4  . Niacin (VITAMIN B-3 PO) Take 1,000 mg by mouth daily.        Marland Kitchen warfarin (COUMADIN) 5 MG tablet TAKE 1 TABLET BY MOUTH DAILY OR AS DIRECTED  30 tablet  5  . Zoledronic Acid (ZOMETA) 4 MG/100ML IVPB Inject 4 mg into the vein every 6 (six) weeks.      Marland Kitchen azithromycin (ZITHROMAX) 250 MG tablet Take 1 tablet (250 mg total) by mouth daily. Take first 2 tablets together, then 1 every day until finished.  6 tablet  0  . HYDROcodone-acetaminophen (NORCO/VICODIN) 5-325  MG per tablet Take 1 tablet by mouth as needed.       . predniSONE (DELTASONE) 50 MG tablet Take 1 tablet (50 mg total) by mouth daily.  5 tablet  0    Exam:  BP 132/61  Pulse 70  Temp(Src) 98.2 F (36.8 C)  Resp 12  SpO2 95% Gen: Well NAD HEENT: EOMI,  MMM Lungs: Normal work of breathing. Coarse breath sounds bilaterally. Posterior chest wall. Mildly tender to touch left lower posterior ribs Heart: RRR no MRG Abd: NABS, Soft. NT, ND Exts: Brisk capillary refill, warm and well perfused.   No results found for this or any previous visit (from the past 24 hour(s)). Dg Chest 1 View  11/12/2013   CLINICAL DATA:  Pain  EXAM: CHEST - 1 VIEW  COMPARISON:  Chest and ribs November 12, 2013 ; lateral chest August 23, 2013  FINDINGS: Lateral chest radiograph obtained. There is marked collapse of the T12 vertebral body, stable. Surgical clips are noted in the right axillary region. Underlying emphysema. No edema or consolidation. No acute fracture apparent.  IMPRESSION: Stable collapse of the T12 vertebral body. Underlying emphysema. No edema or consolidation.   Electronically Signed   By: Lowella Grip M.D.   On: 11/12/2013 16:23   Dg Ribs Unilateral W/chest Left  11/12/2013  CLINICAL DATA:  Pain and cough  EXAM: LEFT RIBS AND CHEST - 3+ VIEW  COMPARISON:  Chest radiographs August 23, 2013 and March 17, 2011  FINDINGS: Frontal chest as well as bilateral oblique and cone-down lower rib images were obtained. There is underlying emphysematous change. There is evidence of previous mastectomy on the right with surgical clips the right axilla. There is scarring in the right apex, stable. There is no edema or consolidation. The heart size is normal. Pulmonary vascularity reflects underlying emphysema. No adenopathy.  There is no demonstrable effusion or pneumothorax. There are no appreciable rib fractures.  IMPRESSION: No appreciable rib fracture. Status post left mastectomy. Underlying emphysema. No edema or  consolidation. Stable asymmetric right apical pleural thickening.   Electronically Signed   By: Lowella Grip M.D.   On: 11/12/2013 16:22    Assessment and Plan: 80 y.o. female with COPD exacerbation. Plan to treat with prednisone, albuterol, and codeine containing cough medication. Use azithromycin to prevent admission possible. No new metastatic disease visible and x-rays today. No pathologic fracture. Followup with primary care provider in a few days for INR recheck.  Discussed warning signs or symptoms. Please see discharge instructions. Patient expresses understanding.    Gregor Hams, MD 11/12/13 [phone]

## 2013-11-12 NOTE — Discharge Instructions (Signed)
Thank you for coming in today. Use the albuterol as needed.  Use the codeine cough medicine as needed.  Take both the prednisone and the azithromycin daily.  Call or go to the emergency room if you get worse, have trouble breathing, have chest pains, or palpitations.  Follow up with Dr. Brigitte Pulse in a few days for a warfarin check.    Chronic Obstructive Pulmonary Disease Exacerbation Chronic obstructive pulmonary disease (COPD) is a common lung condition in which airflow from the lungs is limited. COPD is a general term that can be used to describe many different lung problems that limit airflow, including chronic bronchitis and emphysema. COPD exacerbations are episodes when breathing symptoms become much worse and require extra treatment. Without treatment, COPD exacerbations can be life threatening, and frequent COPD exacerbations can cause further damage to your lungs. CAUSES   Respiratory infections.   Exposure to smoke.   Exposure to air pollution, chemical fumes, or dust. Sometimes there is no apparent cause or trigger. RISK FACTORS  Smoking cigarettes.  Older age.  Frequent prior COPD exacerbations. SIGNS AND SYMPTOMS   Increased coughing.   Increased thick spit (sputum) production.   Increased wheezing.   Increased shortness of breath.   Rapid breathing.   Chest tightness. DIAGNOSIS  Your medical history, a physical exam, and tests will help your health care provider make a diagnosis. Tests may include:  A chest X-ray.  Basic lab tests.  Sputum testing.  An arterial blood gas test. TREATMENT  Depending on the severity of your COPD exacerbation, you may need to be admitted to a hospital for treatment. Some of the treatments commonly used to treat COPD exacerbations are:   Antibiotic medicines.   Bronchodilators. These are drugs that expand the air passages. They may be given with an inhaler or nebulizer. Spacer devices may be needed to help improve  drug delivery.  Corticosteroid medicines.  Supplemental oxygen therapy.  HOME CARE INSTRUCTIONS   Do not smoke. Quitting smoking is very important to prevent COPD from getting worse and exacerbations from happening as often.  Avoid exposure to all substances that irritate the airway, especially to tobacco smoke.   If prescribed, take your antibiotics as directed. Finish them even if you start to feel better.  Only take over-the-counter or prescription medicines as directed by your health care provider.It is important to use correct technique with inhaled medicines.  Drink enough fluids to keep your urine clear or pale yellow (unless you have a medical condition that requires fluid restriction).  Use a cool mist vaporizer. This makes it easier to clear your chest when you cough.   If you have a home nebulizer and oxygen, continue to use them as directed.   Maintain all necessary vaccinations to prevent infections.   Exercise regularly.   Eat a healthy diet.   Keep all follow-up appointments as directed by your health care provider. SEEK IMMEDIATE MEDICAL CARE IF:  You have worsening shortness of breath.   You have trouble talking.   You have severe chest pain.  You have blood in your sputum.  You have a fever.  You have weakness, vomit repeatedly, or faint.   You feel confused.   You continue to get worse. MAKE SURE YOU:   Understand these instructions.  Will watch your condition.  Will get help right away if you are not doing well or get worse. Document Released: 03/22/2007 Document Revised: 03/15/2013 Document Reviewed: 01/27/2013 [facility] Patient Information 2014 [place].

## 2013-11-16 ENCOUNTER — Ambulatory Visit (HOSPITAL_BASED_OUTPATIENT_CLINIC_OR_DEPARTMENT_OTHER): Payer: Medicare Other

## 2013-11-16 ENCOUNTER — Ambulatory Visit (HOSPITAL_BASED_OUTPATIENT_CLINIC_OR_DEPARTMENT_OTHER): Payer: Medicare Other | Admitting: Hematology & Oncology

## 2013-11-16 ENCOUNTER — Other Ambulatory Visit (HOSPITAL_BASED_OUTPATIENT_CLINIC_OR_DEPARTMENT_OTHER): Payer: Medicare Other | Admitting: Lab

## 2013-11-16 ENCOUNTER — Encounter: Payer: Self-pay | Admitting: Hematology & Oncology

## 2013-11-16 VITALS — BP 124/55 | HR 62 | Temp 97.0°F | Resp 12 | Ht 66.0 in | Wt 168.0 lb

## 2013-11-16 DIAGNOSIS — C7952 Secondary malignant neoplasm of bone marrow: Secondary | ICD-10-CM

## 2013-11-16 DIAGNOSIS — C50919 Malignant neoplasm of unspecified site of unspecified female breast: Secondary | ICD-10-CM

## 2013-11-16 DIAGNOSIS — C7951 Secondary malignant neoplasm of bone: Secondary | ICD-10-CM

## 2013-11-16 DIAGNOSIS — R109 Unspecified abdominal pain: Secondary | ICD-10-CM

## 2013-11-16 DIAGNOSIS — E559 Vitamin D deficiency, unspecified: Secondary | ICD-10-CM

## 2013-11-16 LAB — CBC WITH DIFFERENTIAL (CANCER CENTER ONLY)
BASO#: 0 10*3/uL (ref 0.0–0.2)
BASO%: 0.4 % (ref 0.0–2.0)
EOS%: 3.1 % (ref 0.0–7.0)
Eosinophils Absolute: 0.2 10*3/uL (ref 0.0–0.5)
HCT: 35.5 % (ref 34.8–46.6)
HGB: 11.8 g/dL (ref 11.6–15.9)
LYMPH#: 1.2 10*3/uL (ref 0.9–3.3)
LYMPH%: 23.2 % (ref 14.0–48.0)
MCH: 29.2 pg (ref 26.0–34.0)
MCHC: 33.2 g/dL (ref 32.0–36.0)
MCV: 88 fL (ref 81–101)
MONO#: 0.7 10*3/uL (ref 0.1–0.9)
MONO%: 13.4 % — ABNORMAL HIGH (ref 0.0–13.0)
NEUT#: 3.1 10*3/uL (ref 1.5–6.5)
NEUT%: 59.9 % (ref 39.6–80.0)
Platelets: 189 10*3/uL (ref 145–400)
RBC: 4.04 10*6/uL (ref 3.70–5.32)
RDW: 13.7 % (ref 11.1–15.7)
WBC: 5.2 10*3/uL (ref 3.9–10.0)

## 2013-11-16 LAB — CMP (CANCER CENTER ONLY)
ALT(SGPT): 20 U/L (ref 10–47)
AST: 21 U/L (ref 11–38)
Albumin: 3.4 g/dL (ref 3.3–5.5)
Alkaline Phosphatase: 66 U/L (ref 26–84)
BUN, Bld: 14 mg/dL (ref 7–22)
CO2: 31 mEq/L (ref 18–33)
Calcium: 8.8 mg/dL (ref 8.0–10.3)
Chloride: 103 mEq/L (ref 98–108)
Creat: 1 mg/dl (ref 0.6–1.2)
Glucose, Bld: 85 mg/dL (ref 73–118)
Potassium: 3.9 mEq/L (ref 3.3–4.7)
Sodium: 139 mEq/L (ref 128–145)
Total Bilirubin: 1.2 mg/dl (ref 0.20–1.60)
Total Protein: 6.7 g/dL (ref 6.4–8.1)

## 2013-11-16 LAB — CANCER ANTIGEN 27.29: CA 27.29: 42 U/mL — ABNORMAL HIGH (ref 0–39)

## 2013-11-16 MED ORDER — ZOLEDRONIC ACID 4 MG/100ML IV SOLN
4.0000 mg | Freq: Once | INTRAVENOUS | Status: AC
  Administered 2013-11-16: 4 mg via INTRAVENOUS
  Filled 2013-11-16: qty 100

## 2013-11-16 MED ORDER — SODIUM CHLORIDE 0.9 % IV SOLN
Freq: Once | INTRAVENOUS | Status: AC
  Administered 2013-11-16: 13:00:00 via INTRAVENOUS

## 2013-11-16 MED ORDER — SODIUM CHLORIDE 0.9 % IJ SOLN
3.0000 mL | Freq: Once | INTRAMUSCULAR | Status: DC | PRN
  Filled 2013-11-16: qty 10

## 2013-11-16 MED ORDER — SODIUM CHLORIDE 0.9 % IJ SOLN
10.0000 mL | INTRAMUSCULAR | Status: DC | PRN
  Filled 2013-11-16: qty 10

## 2013-11-16 NOTE — Progress Notes (Signed)
Hematology and Oncology Follow Up Visit  JANAS TRISKA [id number] 05/12/1926 80 y.o. 11/16/2013   Principle Diagnosis:  Metastatic breast cancer, bone metastasis only.  Current Therapy:   1. Femara 2.5 mg p.o. daily. 2. Zometa 4 mg IV q.3 months.     Interim History:  Ms.  Naumann is for followup. We last saw her back in March. We did do a bone scan aren't that time. Everything was very stable with his into her bone metastasis.  She was in the emergency room recently. She is having some pain issues. She's having some breathing problems. She had chest x-ray was negative patient had a little bit of emphysema. She is in the rib x-rays done which did not showing fractures. She is stable T12 compression fracture. She feels well right now. No shortness of breath. She's had no bleeding or bruising. There's been no change in bowel or bladder habits. She's done well with the Femara.  Medications: Current outpatient prescriptions:ALPRAZolam (XANAX) 0.25 MG tablet, Take 0.5 mg by mouth at bedtime as needed.  , Disp: , Rfl: ;  letrozole (FEMARA) 2.5 MG tablet, TAKE 1 TABLET BY MOUTH EVERY DAY, Disp: 30 tablet, Rfl: 3;  levothyroxine (SYNTHROID, LEVOTHROID) 75 MCG tablet, Take 75 mg by mouth Daily., Disp: , Rfl: ;  metoprolol succinate (TOPROL-XL) 25 MG 24 hr tablet, Take 1 tablet (25 mg total) by mouth daily., Disp: 30 tablet, Rfl: 4 Niacin (VITAMIN B-3 PO), Take 1,000 mg by mouth daily.  , Disp: , Rfl: ;  predniSONE (DELTASONE) 50 MG tablet, Take 1 tablet (50 mg total) by mouth daily., Disp: 5 tablet, Rfl: 0;  warfarin (COUMADIN) 5 MG tablet, TAKE 1 TABLET BY MOUTH DAILY OR AS DIRECTED, Disp: 30 tablet, Rfl: 5;  Zoledronic Acid (ZOMETA) 4 MG/100ML IVPB, Inject 4 mg into the vein every 6 (six) weeks., Disp: , Rfl:  HYDROcodone-acetaminophen (NORCO/VICODIN) 5-325 MG per tablet, Take 1 tablet by mouth as needed. , Disp: , Rfl:   Allergies:  Allergies  Allergen Reactions  . Ciprofloxacin Rash    Rash in  the mouth  . Penicillins Hives and Rash    Past Medical History, Surgical history, Social history, and Family History were reviewed and updated.  Review of Systems: As above  Physical Exam:  height is 5\' 6"  (1.676 m) and weight is 168 lb (76.204 kg). Her oral temperature is 97 F (36.1 C). Her blood pressure is 124/55 and her pulse is 62. Her respiration is 12.   Elderly, thin but well-nourished white female. Lungs are clear. Cardiac exam regular in rhythm. Abdomen soft. There is no palpable liver or spleen. Back exam shows some slight kyphosis. No tenderness noted over the spine. Extremities shows osteoarthritic changes in her joints. She has some age related muscle atrophy. Neurological exam is nonfocal. Lymph node exam shows no adenopathy. Skin exam shows some seborrheic keratoses. Some actinic keratoses are also noted.  Lab Results  Component Value Date   WBC 5.2 11/16/2013   HGB 11.8 11/16/2013   HCT 35.5 11/16/2013   MCV 88 11/16/2013   PLT 189 11/16/2013     Chemistry      Component Value Date/Time   NA 139 11/16/2013 1113   NA 141 05/01/2013 1922   K 3.9 11/16/2013 1113   K 3.9 05/01/2013 1922   CL 103 11/16/2013 1113   CL 106 05/01/2013 1922   CO2 31 11/16/2013 1113   CO2 27 05/01/2013 1922   BUN 14 11/16/2013 1113  BUN 15 05/01/2013 1922   CREATININE 1.0 11/16/2013 1113   CREATININE 0.89 05/01/2013 1922      Component Value Date/Time   CALCIUM 8.8 11/16/2013 1113   CALCIUM 9.4 05/01/2013 1922   ALKPHOS 66 11/16/2013 1113   ALKPHOS 51 08/01/2012 1359   AST 21 11/16/2013 1113   AST 14 08/01/2012 1359   ALT 20 11/16/2013 1113   ALT 8 08/01/2012 1359   BILITOT 1.20 11/16/2013 1113   BILITOT 1.2 08/01/2012 1359         Impression and Plan: Ms. Herrmann is a 40-year-old white female with metastatic breast cancer. Her disease is confined to her bones. She has been stable. She has been stable now for over 5 years. She's done incredibly well.  She is on Coumadin. She's on  Coumadin for her heart.  We will continue the Zometa.  I will plan to get her back to see [place] in another 3 months.   Volanda Napoleon, MD 6/11/201512:24 PM

## 2013-11-16 NOTE — Patient Instructions (Signed)

## 2013-11-29 ENCOUNTER — Ambulatory Visit: Payer: Medicare Other | Admitting: Pharmacist Clinician (PhC)/ Clinical Pharmacy Specialist

## 2013-12-18 ENCOUNTER — Ambulatory Visit (INDEPENDENT_AMBULATORY_CARE_PROVIDER_SITE_OTHER): Payer: Medicare Other | Admitting: Pharmacist

## 2013-12-18 DIAGNOSIS — I4891 Unspecified atrial fibrillation: Secondary | ICD-10-CM

## 2013-12-18 DIAGNOSIS — Z7901 Long term (current) use of anticoagulants: Secondary | ICD-10-CM

## 2013-12-18 LAB — POCT INR: INR: 2.3

## 2014-01-20 ENCOUNTER — Other Ambulatory Visit: Payer: Self-pay | Admitting: Cardiovascular Disease

## 2014-01-22 NOTE — Telephone Encounter (Signed)
Rx refill sent to patient pharmacy   

## 2014-01-29 ENCOUNTER — Ambulatory Visit (INDEPENDENT_AMBULATORY_CARE_PROVIDER_SITE_OTHER): Payer: Medicare Other | Admitting: Pharmacist Clinician (PhC)/ Clinical Pharmacy Specialist

## 2014-01-29 DIAGNOSIS — I4891 Unspecified atrial fibrillation: Secondary | ICD-10-CM

## 2014-01-29 DIAGNOSIS — Z7901 Long term (current) use of anticoagulants: Secondary | ICD-10-CM

## 2014-01-29 LAB — POCT INR: INR: 2.1

## 2014-02-05 ENCOUNTER — Other Ambulatory Visit: Payer: Self-pay | Admitting: Hematology & Oncology

## 2014-02-08 ENCOUNTER — Ambulatory Visit (HOSPITAL_BASED_OUTPATIENT_CLINIC_OR_DEPARTMENT_OTHER): Payer: Medicare Other | Admitting: Hematology & Oncology

## 2014-02-08 ENCOUNTER — Ambulatory Visit (HOSPITAL_BASED_OUTPATIENT_CLINIC_OR_DEPARTMENT_OTHER): Payer: Medicare Other

## 2014-02-08 ENCOUNTER — Encounter: Payer: Self-pay | Admitting: Hematology & Oncology

## 2014-02-08 ENCOUNTER — Other Ambulatory Visit (HOSPITAL_BASED_OUTPATIENT_CLINIC_OR_DEPARTMENT_OTHER): Payer: Medicare Other | Admitting: Lab

## 2014-02-08 VITALS — BP 158/59 | HR 60 | Temp 97.7°F | Resp 16 | Ht 66.0 in | Wt 168.0 lb

## 2014-02-08 DIAGNOSIS — E559 Vitamin D deficiency, unspecified: Secondary | ICD-10-CM

## 2014-02-08 DIAGNOSIS — C50919 Malignant neoplasm of unspecified site of unspecified female breast: Secondary | ICD-10-CM

## 2014-02-08 DIAGNOSIS — C7951 Secondary malignant neoplasm of bone: Secondary | ICD-10-CM

## 2014-02-08 DIAGNOSIS — I4891 Unspecified atrial fibrillation: Secondary | ICD-10-CM

## 2014-02-08 DIAGNOSIS — C7952 Secondary malignant neoplasm of bone marrow: Secondary | ICD-10-CM

## 2014-02-08 LAB — CBC WITH DIFFERENTIAL (CANCER CENTER ONLY)
BASO#: 0 10*3/uL (ref 0.0–0.2)
BASO%: 0.9 % (ref 0.0–2.0)
EOS%: 6 % (ref 0.0–7.0)
Eosinophils Absolute: 0.2 10*3/uL (ref 0.0–0.5)
HCT: 37.3 % (ref 34.8–46.6)
HGB: 12.3 g/dL (ref 11.6–15.9)
LYMPH#: 1.1 10*3/uL (ref 0.9–3.3)
LYMPH%: 34 % (ref 14.0–48.0)
MCH: 28.6 pg (ref 26.0–34.0)
MCHC: 33 g/dL (ref 32.0–36.0)
MCV: 87 fL (ref 81–101)
MONO#: 0.5 10*3/uL (ref 0.1–0.9)
MONO%: 13.7 % — ABNORMAL HIGH (ref 0.0–13.0)
NEUT#: 1.5 10*3/uL (ref 1.5–6.5)
NEUT%: 45.4 % (ref 39.6–80.0)
Platelets: 155 10*3/uL (ref 145–400)
RBC: 4.3 10*6/uL (ref 3.70–5.32)
RDW: 14.4 % (ref 11.1–15.7)
WBC: 3.4 10*3/uL — ABNORMAL LOW (ref 3.9–10.0)

## 2014-02-08 MED ORDER — SODIUM CHLORIDE 0.9 % IV SOLN
Freq: Once | INTRAVENOUS | Status: AC
  Administered 2014-02-08: 14:00:00 via INTRAVENOUS

## 2014-02-08 MED ORDER — SODIUM CHLORIDE 0.9 % IJ SOLN
10.0000 mL | INTRAMUSCULAR | Status: DC | PRN
  Filled 2014-02-08: qty 10

## 2014-02-08 MED ORDER — ZOLEDRONIC ACID 4 MG/100ML IV SOLN
4.0000 mg | Freq: Once | INTRAVENOUS | Status: AC
  Administered 2014-02-08: 4 mg via INTRAVENOUS
  Filled 2014-02-08: qty 100

## 2014-02-08 MED ORDER — SODIUM CHLORIDE 0.9 % IJ SOLN
3.0000 mL | Freq: Once | INTRAMUSCULAR | Status: DC | PRN
  Filled 2014-02-08: qty 10

## 2014-02-08 NOTE — Patient Instructions (Signed)

## 2014-02-08 NOTE — Progress Notes (Signed)
Hematology and Oncology Follow Up Visit  Amy White [id number] July 27, 1925 80 y.o. 02/08/2014   Principle Diagnosis:  Metastatic breast cancer, bone metastasis only.  Current Therapy:   1. Femara 2.5 mg p.o. daily. 2. Zometa 4 mg IV q.3 months.     Interim History:  Ms.  White is for followup. We last saw her back in June.  We did do a bone scan back in March. . Everything was very stable with respect to her bone metastasis.  Her heart has been doing okay. She is on Coumadin. She's not had any issues with her heart and atrial fibrillation. She has a sebaceous cyst on her back. This probably needs to be removed. She will see her family doctor regarding this.  She's had no problems with bowels or bladder. She's had no problems with nausea or vomiting. She's had no leg swelling. She's been doing some yard work. She enjoys this.   Her last CA 27-29 in June was 35. Medications: Current outpatient prescriptions:ALPRAZolam (XANAX) 0.25 MG tablet, Take 0.5 mg by mouth at bedtime as needed.  , Disp: , Rfl: ;  HYDROcodone-acetaminophen (NORCO/VICODIN) 5-325 MG per tablet, Take 1 tablet by mouth as needed. , Disp: , Rfl: ;  letrozole (FEMARA) 2.5 MG tablet, TAKE 1 TABLET BY MOUTH EVERY DAY, Disp: 30 tablet, Rfl: 3;  levothyroxine (SYNTHROID, LEVOTHROID) 75 MCG tablet, Take 75 mg by mouth Daily., Disp: , Rfl:  metoprolol succinate (TOPROL-XL) 25 MG 24 hr tablet, TAKE 1 TABLET (25 MG TOTAL) BY MOUTH DAILY., Disp: 30 tablet, Rfl: 2;  Niacin (VITAMIN B-3 PO), Take 1,000 mg by mouth daily.  , Disp: , Rfl: ;  warfarin (COUMADIN) 5 MG tablet, TAKE 1 TABLET BY MOUTH DAILY OR AS DIRECTED, Disp: 30 tablet, Rfl: 5;  Zoledronic Acid (ZOMETA) 4 MG/100ML IVPB, Inject 4 mg into the vein every 6 (six) weeks., Disp: , Rfl:   Allergies:  Allergies  Allergen Reactions  . Ciprofloxacin Rash    Rash in the mouth  . Penicillins Hives and Rash    Past Medical History, Surgical history, Social history, and Family  History were reviewed and updated.  Review of Systems: As above  Physical Exam:  height is 5\' 6"  (1.676 m) and weight is 168 lb (76.204 kg). Her oral temperature is 97.7 F (36.5 C). Her blood pressure is 158/59 and her pulse is 60. Her respiration is 16.   Elderly, thin but well-nourished white female. Lungs are clear. Cardiac exam regular in rhythm. Abdomen soft. There is no palpable liver or spleen. Back exam shows some slight kyphosis. No tenderness noted over the spine. Extremities shows osteoarthritic changes in her joints. She has some age related muscle atrophy. Neurological exam is nonfocal. Lymph node exam shows no adenopathy. Skin exam shows some seborrheic keratoses. Some actinic keratoses are also noted.  Lab Results  Component Value Date   WBC 3.4* 02/08/2014   HGB 12.3 02/08/2014   HCT 37.3 02/08/2014   MCV 87 02/08/2014   PLT 155 02/08/2014     Chemistry      Component Value Date/Time   NA 139 11/16/2013 1113   NA 141 05/01/2013 1922   K 3.9 11/16/2013 1113   K 3.9 05/01/2013 1922   CL 103 11/16/2013 1113   CL 106 05/01/2013 1922   CO2 31 11/16/2013 1113   CO2 27 05/01/2013 1922   BUN 14 11/16/2013 1113   BUN 15 05/01/2013 1922   CREATININE 1.0 11/16/2013 1113  CREATININE 0.89 05/01/2013 1922      Component Value Date/Time   CALCIUM 8.8 11/16/2013 1113   CALCIUM 9.4 05/01/2013 1922   ALKPHOS 66 11/16/2013 1113   ALKPHOS 51 08/01/2012 1359   AST 21 11/16/2013 1113   AST 14 08/01/2012 1359   ALT 20 11/16/2013 1113   ALT 8 08/01/2012 1359   BILITOT 1.20 11/16/2013 1113   BILITOT 1.2 08/01/2012 1359         Impression and Plan: Amy White is a 69-year-old white female with metastatic breast cancer. Her disease is confined to her bones. She has been stable. She has been stable now for over 5 years. She's done incredibly well. We will continue her on the Femara. I do not see any indication for any thing different for right now.  She is on Coumadin. She's on Coumadin for her  heart.  We will continue the Zometa.  I want to get another bone scan on her. We will every done on her before we see her back. This will probably be in December.  I will plan to get her back to see [place] in another 3 months.   Volanda Napoleon, MD 9/3/201512:51 PM

## 2014-02-09 ENCOUNTER — Telehealth: Payer: Self-pay | Admitting: *Deleted

## 2014-02-09 LAB — COMPREHENSIVE METABOLIC PANEL
ALT: 9 U/L (ref 0–35)
AST: 16 U/L (ref 0–37)
Albumin: 3.9 g/dL (ref 3.5–5.2)
Alkaline Phosphatase: 48 U/L (ref 39–117)
BUN: 14 mg/dL (ref 6–23)
CO2: 27 mEq/L (ref 19–32)
Calcium: 9 mg/dL (ref 8.4–10.5)
Chloride: 110 mEq/L (ref 96–112)
Creatinine, Ser: 0.91 mg/dL (ref 0.50–1.10)
Glucose, Bld: 87 mg/dL (ref 70–99)
Potassium: 4.6 mEq/L (ref 3.5–5.3)
Sodium: 144 mEq/L (ref 135–145)
Total Bilirubin: 1.1 mg/dL (ref 0.2–1.2)
Total Protein: 6.1 g/dL (ref 6.0–8.3)

## 2014-02-09 LAB — VITAMIN D 25 HYDROXY (VIT D DEFICIENCY, FRACTURES): Vit D, 25-Hydroxy: 30 ng/mL (ref 30–89)

## 2014-02-09 LAB — CANCER ANTIGEN 27.29: CA 27.29: 48 U/mL — ABNORMAL HIGH (ref 0–39)

## 2014-02-09 NOTE — Telephone Encounter (Signed)
Message copied by Rico Ala on Fri Feb 09, 2014 12:50 PM ------      Message from: Volanda Napoleon      Created: Fri Feb 09, 2014  7:17 AM       Call - vit D is ok!!  Labs look stable.  pete ------

## 2014-02-26 ENCOUNTER — Telehealth: Payer: Self-pay | Admitting: Cardiovascular Disease

## 2014-02-26 NOTE — Telephone Encounter (Signed)
Closed encounter °

## 2014-03-07 ENCOUNTER — Ambulatory Visit: Payer: Medicare Other | Admitting: Pharmacist Clinician (PhC)/ Clinical Pharmacy Specialist

## 2014-03-07 ENCOUNTER — Ambulatory Visit: Payer: Medicare Other | Admitting: Physician Assistant

## 2014-03-12 ENCOUNTER — Ambulatory Visit: Payer: Medicare Other | Admitting: Pharmacist Clinician (PhC)/ Clinical Pharmacy Specialist

## 2014-03-12 ENCOUNTER — Ambulatory Visit (INDEPENDENT_AMBULATORY_CARE_PROVIDER_SITE_OTHER): Payer: Medicare Other | Admitting: Pharmacist Clinician (PhC)/ Clinical Pharmacy Specialist

## 2014-03-12 DIAGNOSIS — Z7901 Long term (current) use of anticoagulants: Secondary | ICD-10-CM

## 2014-03-12 DIAGNOSIS — I4891 Unspecified atrial fibrillation: Secondary | ICD-10-CM

## 2014-03-12 LAB — POCT INR: INR: 1.9

## 2014-03-27 ENCOUNTER — Emergency Department (HOSPITAL_COMMUNITY)
Admission: EM | Admit: 2014-03-27 | Discharge: 2014-03-27 | Disposition: A | Discharge status: Home / Self Care | Payer: Medicare Other | Source: Home / Self Care | Attending: Emergency Medicine | Admitting: Emergency Medicine

## 2014-03-27 ENCOUNTER — Encounter (HOSPITAL_COMMUNITY): Payer: Self-pay | Admitting: Emergency Medicine

## 2014-03-27 ENCOUNTER — Telehealth: Payer: Self-pay | Admitting: Hematology & Oncology

## 2014-03-27 DIAGNOSIS — R42 Dizziness and giddiness: Secondary | ICD-10-CM

## 2014-03-27 DIAGNOSIS — Z7901 Long term (current) use of anticoagulants: Secondary | ICD-10-CM

## 2014-03-27 DIAGNOSIS — R002 Palpitations: Secondary | ICD-10-CM

## 2014-03-27 DIAGNOSIS — I480 Paroxysmal atrial fibrillation: Secondary | ICD-10-CM

## 2014-03-27 MED ORDER — AMLODIPINE BESYLATE 2.5 MG PO TABS: 2.5000 mg | ORAL_TABLET | Freq: Every day | ORAL | Status: DC

## 2014-03-27 NOTE — ED Provider Notes (Signed)
CSN: [id number]     Arrival date & time 03/27/14  1748 History   First MD Initiated Contact with Patient 03/27/14 1758     Chief Complaint  Patient presents with  . Dizziness   (Consider location/radiation/quality/duration/timing/severity/associated sxs/prior Treatment) HPI She is an 80 year old woman here for evaluation of dizziness. She states on Sunday she had an episode of feeling lightheaded and she reports that she did pass out. She had a episode of dizziness yesterday without syncope. Today around 2:30, she reports an additional episode of lightheadedness with syncope. She denies any falls, as she was able to get to a seated position before passing out. There was some nausea associated with the episode today. She denies any associated chest pain, shortness of breath. She denies any focal neurologic deficits. She has a history of palpitations, and has had palpitations the last few days but not associated with the lightheaded episodes. She denies any changes in her medications. She denies any recent illnesses. She is eating and drinking normally. She does state that she picked up a new bottle of her metoprolol on Sunday, and these episodes seem to occur a few hours after taking her pill.  Past Medical History  Diagnosis Date  . Breast cancer metastasized to bone 05/13/2011  . Cancer   . Thyroid disease   . Atrial fibrillation    Past Surgical History  Procedure Laterality Date  . Breast surgery    . Leg surgery     No family history on file. History  Substance Use Topics  . Smoking status: Never Smoker   . Smokeless tobacco: Never Used     Comment: never used tobacco  . Alcohol Use: Yes     Comment: ocassionally   OB History   Grav Para Term Preterm Abortions TAB SAB Ect Mult Living                 Review of Systems  Constitutional: Negative for fever, activity change and appetite change.  HENT: Negative.   Respiratory: Negative.   Cardiovascular: Positive for  palpitations. Negative for chest pain.  Gastrointestinal: Negative.   Genitourinary: Negative.   Neurological: Positive for dizziness, syncope and light-headedness. Negative for weakness and numbness.    Allergies  Ciprofloxacin and Penicillins  Home Medications   Prior to Admission medications   Medication Sig Start Date End Date Taking? Authorizing Provider  ALPRAZolam Duanne Moron) 0.25 MG tablet Take 0.5 mg by mouth at bedtime as needed.      Historical Provider, MD  amLODipine (NORVASC) 2.5 MG tablet Take 1 tablet (2.5 mg total) by mouth daily. 03/27/14   Melony Overly, MD  HYDROcodone-acetaminophen (NORCO/VICODIN) 5-325 MG per tablet Take 1 tablet by mouth as needed.  12/22/12   Historical Provider, MD  letrozole (FEMARA) 2.5 MG tablet TAKE 1 TABLET BY MOUTH EVERY DAY 02/05/14   Volanda Napoleon, MD  levothyroxine (SYNTHROID, LEVOTHROID) 75 MCG tablet Take 75 mg by mouth Daily. 04/27/11   Historical Provider, MD  metoprolol succinate (TOPROL-XL) 25 MG 24 hr tablet TAKE 1 TABLET (25 MG TOTAL) BY MOUTH DAILY. 01/22/14   Mihai Croitoru, MD  Niacin (VITAMIN B-3 PO) Take 1,000 mg by mouth daily.      Historical Provider, MD  warfarin (COUMADIN) 5 MG tablet TAKE 1 TABLET BY MOUTH DAILY OR AS DIRECTED 01/22/14   Mihai Croitoru, MD  Zoledronic Acid (ZOMETA) 4 MG/100ML IVPB Inject 4 mg into the vein every 6 (six) weeks.    Historical Provider, MD  BP 182/78  Pulse 68  Temp(Src) 99.3 F (37.4 C) (Oral)  Resp 16  SpO2 97% Physical Exam  Constitutional: She is oriented to person, place, and time. She appears well-developed and well-nourished. No distress.  HENT:  Head: Normocephalic and atraumatic.  Eyes: EOM are normal.  Right pupil dilated for eye exam earlier today.  R pupil larger than left, but both are reactive to light.  Neck: Neck supple.  Cardiovascular: Normal rate, regular rhythm and normal heart sounds.   No murmur heard. Pulmonary/Chest: Effort normal and breath sounds normal. No  respiratory distress. She has no wheezes. She has no rales.  Lymphadenopathy:    She has no cervical adenopathy.  Neurological: She is alert and oriented to person, place, and time. No cranial nerve deficit. She exhibits normal muscle tone. Coordination normal.  Skin: Skin is warm and dry. She is not diaphoretic.    ED Course  EKG  Date/Time: 03/27/2014 7:16 PM Performed by: Melony Overly Authorized by: Melony Overly Interpreted by ED physician Comparison: compared with previous ECG from 08/23/2013 Comparison to previous ECG: unchanged Rhythm: sinus rhythm Rate: normal QRS axis: normal Conduction: 1st degree ST Segments: ST segments normal T Waves: T waves normal Clinical impression: normal ECG Comments: First degree AV block   (including critical care time) Labs Review Labs Reviewed - No data to display  Imaging Review No results found.   MDM   1. Dizziness   2. Palpitations   3. Paroxysmal atrial fibrillation   4. Warfarin anticoagulation    EKG remarkable only for first grade AV block. Orthostatic vitals are negative. She is currently asymptomatic. Called and discussed with Dr. Harrington Challenger, cardiologist on call. We will stop the Toprol and start amlodipine 2.5 mg for her blood pressure. The cardiologist's office will get in touch with her to set up a Holter monitor and move her appointment sooner. Reviewed reasons to go to the emergency room as in after visit summary.    Melony Overly, MD 03/27/14 [phone]

## 2014-03-27 NOTE — Discharge Instructions (Signed)
Please stop the Toprol XL until you see your cardiologist. I spoke with the office of your cardiologist.  They will be calling you in the next day or two to move up your appointment and set up some tests.  If you have another dizzy spell, develop chest pain, shortness of breath, or your palpitations worsen, please call 911 or go straight to the ER.

## 2014-03-27 NOTE — ED Notes (Signed)
Patient reports episodes of feeling dizzy, feeling like she was going to pass out.  Patient does lie down when she feels this way.  Denies cough, cold or runny nose.  Patient does reports feeling sob yesterday.

## 2014-03-27 NOTE — Telephone Encounter (Signed)
Faxed medical records to:  [facility] P: [phone] F: [phone]      COPY SCANNED

## 2014-03-28 ENCOUNTER — Other Ambulatory Visit: Payer: Self-pay | Admitting: Internal Medicine

## 2014-03-29 ENCOUNTER — Emergency Department (HOSPITAL_COMMUNITY): Payer: Medicare Other

## 2014-03-29 ENCOUNTER — Encounter (HOSPITAL_COMMUNITY): Payer: Self-pay | Admitting: Emergency Medicine

## 2014-03-29 ENCOUNTER — Observation Stay (HOSPITAL_COMMUNITY)
Admission: EM | Admit: 2014-03-29 | Discharge: 2014-04-01 | Disposition: A | Discharge status: Home / Self Care | Payer: Medicare Other | Attending: Internal Medicine | Admitting: Internal Medicine

## 2014-03-29 DIAGNOSIS — I10 Essential (primary) hypertension: Secondary | ICD-10-CM

## 2014-03-29 DIAGNOSIS — I495 Sick sinus syndrome: Secondary | ICD-10-CM | POA: Diagnosis present

## 2014-03-29 DIAGNOSIS — R55 Syncope and collapse: Principal | ICD-10-CM | POA: Diagnosis present

## 2014-03-29 DIAGNOSIS — R42 Dizziness and giddiness: Secondary | ICD-10-CM | POA: Diagnosis not present

## 2014-03-29 DIAGNOSIS — Z79899 Other long term (current) drug therapy: Secondary | ICD-10-CM | POA: Diagnosis not present

## 2014-03-29 DIAGNOSIS — I4891 Unspecified atrial fibrillation: Secondary | ICD-10-CM | POA: Diagnosis not present

## 2014-03-29 DIAGNOSIS — E079 Disorder of thyroid, unspecified: Secondary | ICD-10-CM | POA: Diagnosis not present

## 2014-03-29 DIAGNOSIS — Z853 Personal history of malignant neoplasm of breast: Secondary | ICD-10-CM | POA: Insufficient documentation

## 2014-03-29 DIAGNOSIS — Z7901 Long term (current) use of anticoagulants: Secondary | ICD-10-CM | POA: Diagnosis not present

## 2014-03-29 DIAGNOSIS — Z880 Allergy status to penicillin: Secondary | ICD-10-CM | POA: Insufficient documentation

## 2014-03-29 DIAGNOSIS — I480 Paroxysmal atrial fibrillation: Secondary | ICD-10-CM

## 2014-03-29 DIAGNOSIS — R4020 Unspecified coma: Secondary | ICD-10-CM | POA: Diagnosis present

## 2014-03-29 DIAGNOSIS — C50912 Malignant neoplasm of unspecified site of left female breast: Secondary | ICD-10-CM

## 2014-03-29 DIAGNOSIS — C7951 Secondary malignant neoplasm of bone: Secondary | ICD-10-CM

## 2014-03-29 DIAGNOSIS — I482 Chronic atrial fibrillation, unspecified: Secondary | ICD-10-CM | POA: Diagnosis present

## 2014-03-29 DIAGNOSIS — C50919 Malignant neoplasm of unspecified site of unspecified female breast: Secondary | ICD-10-CM | POA: Diagnosis present

## 2014-03-29 HISTORY — DX: Unspecified osteoarthritis, unspecified site: M19.90

## 2014-03-29 HISTORY — DX: Unspecified asthma, uncomplicated: J45.909

## 2014-03-29 HISTORY — DX: Anxiety disorder, unspecified: F41.9

## 2014-03-29 HISTORY — DX: Thyrotoxicosis, unspecified without thyrotoxic crisis or storm: E05.90

## 2014-03-29 HISTORY — DX: Chronic obstructive pulmonary disease, unspecified: J44.9

## 2014-03-29 HISTORY — DX: Malignant neoplasm of unspecified site of unspecified female breast: C50.919

## 2014-03-29 HISTORY — DX: Other difficulties with micturition: R39.198

## 2014-03-29 HISTORY — DX: Personal history of other diseases of the digestive system: Z87.19

## 2014-03-29 HISTORY — DX: Essential (primary) hypertension: I10

## 2014-03-29 HISTORY — DX: Gastro-esophageal reflux disease without esophagitis: K21.9

## 2014-03-29 LAB — CBC
HCT: 37.6 % (ref 36.0–46.0)
Hemoglobin: 12.6 g/dL (ref 12.0–15.0)
MCH: 27.8 pg (ref 26.0–34.0)
MCHC: 33.5 g/dL (ref 30.0–36.0)
MCV: 82.8 fL (ref 78.0–100.0)
Platelets: 143 10*3/uL — ABNORMAL LOW (ref 150–400)
RBC: 4.54 MIL/uL (ref 3.87–5.11)
RDW: 14 % (ref 11.5–15.5)
WBC: 3.6 10*3/uL — ABNORMAL LOW (ref 4.0–10.5)

## 2014-03-29 LAB — CBG MONITORING, ED: Glucose-Capillary: 97 mg/dL (ref 70–99)

## 2014-03-29 LAB — PROTIME-INR
INR: 2.09 — ABNORMAL HIGH (ref 0.00–1.49)
Prothrombin Time: 23.6 seconds — ABNORMAL HIGH (ref 11.6–15.2)

## 2014-03-29 LAB — BASIC METABOLIC PANEL
Anion gap: 12 (ref 5–15)
BUN: 11 mg/dL (ref 6–23)
CO2: 24 mEq/L (ref 19–32)
Calcium: 9.5 mg/dL (ref 8.4–10.5)
Chloride: 105 mEq/L (ref 96–112)
Creatinine, Ser: 0.86 mg/dL (ref 0.50–1.10)
GFR calc Af Amer: 69 mL/min — ABNORMAL LOW (ref >90)
GFR calc non Af Amer: 59 mL/min — ABNORMAL LOW (ref >90)
Glucose, Bld: 111 mg/dL — ABNORMAL HIGH (ref 70–99)
Potassium: 3.6 mEq/L — ABNORMAL LOW (ref 3.7–5.3)
Sodium: 141 mEq/L (ref 137–147)

## 2014-03-29 MED ORDER — OXYCODONE HCL 5 MG PO TABS: 5.0000 mg | ORAL_TABLET | ORAL | Status: DC | PRN

## 2014-03-29 MED ORDER — LEVOTHYROXINE SODIUM 75 MCG PO TABS
75.0000 ug | ORAL_TABLET | Freq: Every day | ORAL | Status: DC
  Administered 2014-03-30 – 2014-04-01 (×3): 75 ug via ORAL
  Filled 2014-03-29 (×3): qty 1

## 2014-03-29 MED ORDER — LETROZOLE 2.5 MG PO TABS
2.5000 mg | ORAL_TABLET | Freq: Every day | ORAL | Status: DC
  Administered 2014-03-31 – 2014-04-01 (×2): 2.5 mg via ORAL
  Filled 2014-03-29 (×5): qty 1

## 2014-03-29 MED ORDER — ZOLPIDEM TARTRATE 5 MG PO TABS: 5.0000 mg | ORAL_TABLET | Freq: Every evening | ORAL | Status: DC | PRN

## 2014-03-29 MED ORDER — VITAMIN D 1000 UNITS PO TABS
1000.0000 [IU] | ORAL_TABLET | ORAL | Status: DC
  Administered 2014-03-30: 1000 [IU] via ORAL
  Filled 2014-03-29: qty 1

## 2014-03-29 MED ORDER — SODIUM CHLORIDE 0.9 % IJ SOLN
3.0000 mL | Freq: Two times a day (BID) | INTRAMUSCULAR | Status: DC
  Administered 2014-03-30 – 2014-03-31 (×2): 3 mL via INTRAVENOUS

## 2014-03-29 MED ORDER — ACETAMINOPHEN 325 MG PO TABS: 650.0000 mg | ORAL_TABLET | Freq: Four times a day (QID) | ORAL | Status: DC | PRN

## 2014-03-29 MED ORDER — LISINOPRIL 10 MG PO TABS
10.0000 mg | ORAL_TABLET | Freq: Every day | ORAL | Status: DC
  Administered 2014-03-30 – 2014-04-01 (×3): 10 mg via ORAL
  Filled 2014-03-29 (×3): qty 1

## 2014-03-29 MED ORDER — SODIUM CHLORIDE 0.9 % IJ SOLN
3.0000 mL | Freq: Two times a day (BID) | INTRAMUSCULAR | Status: DC
  Administered 2014-03-30 – 2014-03-31 (×3): 3 mL via INTRAVENOUS

## 2014-03-29 MED ORDER — METOPROLOL TARTRATE 1 MG/ML IV SOLN: 2.5000 mg | Freq: Four times a day (QID) | INTRAVENOUS | Status: DC | PRN

## 2014-03-29 MED ORDER — ONDANSETRON HCL 4 MG/2ML IJ SOLN: 4.0000 mg | Freq: Three times a day (TID) | INTRAMUSCULAR | Status: AC | PRN

## 2014-03-29 MED ORDER — ALPRAZOLAM 0.25 MG PO TABS
0.1250 mg | ORAL_TABLET | Freq: Every evening | ORAL | Status: DC | PRN
  Administered 2014-03-31: 0.125 mg via ORAL
  Filled 2014-03-29: qty 1

## 2014-03-29 MED ORDER — MORPHINE SULFATE 2 MG/ML IJ SOLN: 1.0000 mg | INTRAMUSCULAR | Status: DC | PRN

## 2014-03-29 MED ORDER — ONDANSETRON HCL 4 MG/2ML IJ SOLN: 4.0000 mg | Freq: Four times a day (QID) | INTRAMUSCULAR | Status: DC | PRN

## 2014-03-29 MED ORDER — ONDANSETRON HCL 4 MG PO TABS: 4.0000 mg | ORAL_TABLET | Freq: Four times a day (QID) | ORAL | Status: DC | PRN

## 2014-03-29 MED ORDER — SODIUM CHLORIDE 0.9 % IV SOLN
250.0000 mL | INTRAVENOUS | Status: DC | PRN
Start: 2014-03-29 — End: 2014-04-01

## 2014-03-29 MED ORDER — AMLODIPINE BESYLATE 5 MG PO TABS
5.0000 mg | ORAL_TABLET | Freq: Every day | ORAL | Status: DC
  Administered 2014-03-30 – 2014-03-31 (×2): 5 mg via ORAL
  Filled 2014-03-29 (×2): qty 1

## 2014-03-29 MED ORDER — SODIUM CHLORIDE 0.9 % IJ SOLN: 3.0000 mL | INTRAMUSCULAR | Status: DC | PRN

## 2014-03-29 MED ORDER — ALBUTEROL SULFATE (2.5 MG/3ML) 0.083% IN NEBU: 2.5000 mg | INHALATION_SOLUTION | Freq: Four times a day (QID) | RESPIRATORY_TRACT | Status: DC | PRN

## 2014-03-29 MED ORDER — ALBUTEROL SULFATE HFA 108 (90 BASE) MCG/ACT IN AERS: 2.0000 | INHALATION_SPRAY | Freq: Four times a day (QID) | RESPIRATORY_TRACT | Status: DC | PRN

## 2014-03-29 MED ORDER — ACETAMINOPHEN 650 MG RE SUPP: 650.0000 mg | Freq: Four times a day (QID) | RECTAL | Status: DC | PRN

## 2014-03-29 NOTE — ED Notes (Addendum)
Pt c/o near syncopal episode at home. Reports getting out of shower when she felt like she was going to pass out associated with a increased heart rate. Hx of afib. Reports similar symptoms on Tuesday in which she went to [facility]. Reports change in medications from taking toprolol to amlodipine. Also reports increase in blood pressure x 2 weeks. Denies chest pain at this time

## 2014-03-29 NOTE — ED Notes (Signed)
Pt in from home via [facility], per report pt c/o syncopal episode, pt states, "I was seen at urgent care Tuesday for the same thing & they switched my Toprolol to Amlodipine." pt denies HA, blurred vision  & slurred speech, pt A&O x4, denies falling or head injury during syncopal episode, unwitnessed syncopal episode, unknown duration, pt states, "Everything went black." denies CP, SOB, N/v/D

## 2014-03-29 NOTE — ED Provider Notes (Signed)
CSN: [id number]     Arrival date & time 03/29/14  1817 History   First MD Initiated Contact with Patient 03/29/14 1828     Chief Complaint  Patient presents with  . Loss of Consciousness   Amy White is a 80 y.o. female with a history of atrial fibrillation presents to the ED complaining of a syncopal episode prior to arrival. The patient states that she was walking to her kitchen when she started having palpitations and the room started to go black. She then sat down and then laid down on her couch and noted that everything went black for a period of about 2-3 minutes. After laying down she felt better and proceeded to call 911. She reports she had a similar syncopal episode on October 20 of this year. She was seen at urgent care at this time and her metoprolol was stopped and she was placed on amlodipine 2.5 mg. She is on Coumadin 5 mg once daily for her atrial fibrillation. She denies chest pain, shortness of breath, history of vertigo, history of MI cardiac cath or stents.  (Consider location/radiation/quality/duration/timing/severity/associated sxs/prior Treatment) Patient is a 80 y.o. female presenting with syncope. The history is provided by the patient and a significant other.  Loss of Consciousness Episode history:  Multiple Most recent episode:  Today Duration:  3 minutes Chronicity:  New Context: not bowel movement and not urination   Relieved by:  Lying down Associated symptoms: dizziness and palpitations   Associated symptoms: no anxiety, no chest pain, no confusion, no diaphoresis, no difficulty breathing, no fever, no focal weakness, no headaches, no malaise/fatigue, no nausea, no recent fall, no recent injury, no recent surgery, no rectal bleeding, no seizures, no shortness of breath, no vomiting and no weakness     Past Medical History  Diagnosis Date  . Breast cancer metastasized to bone 05/13/2011  . Cancer   . Thyroid disease   . Atrial fibrillation    Past  Surgical History  Procedure Laterality Date  . Breast surgery    . Leg surgery     No family history on file. History  Substance Use Topics  . Smoking status: Never Smoker   . Smokeless tobacco: Never Used     Comment: never used tobacco  . Alcohol Use: Yes     Comment: ocassionally   OB History   Grav Para Term Preterm Abortions TAB SAB Ect Mult Living                 Review of Systems  Constitutional: Negative for fever, chills, malaise/fatigue and diaphoresis.  Respiratory: Negative for cough, shortness of breath and wheezing.   Cardiovascular: Positive for palpitations and syncope. Negative for chest pain.  Gastrointestinal: Negative for nausea, vomiting and abdominal pain.  Skin: Negative for rash.  Neurological: Positive for dizziness and syncope. Negative for focal weakness, seizures, facial asymmetry, weakness, numbness and headaches.  Psychiatric/Behavioral: Negative for confusion. The patient is not nervous/anxious.   All other systems reviewed and are negative.     Allergies  Ciprofloxacin and Penicillins  Home Medications   Prior to Admission medications   Medication Sig Start Date End Date Taking? Authorizing Provider  albuterol (PROVENTIL HFA;VENTOLIN HFA) 108 (90 BASE) MCG/ACT inhaler Inhale 2 puffs into the lungs every 6 (six) hours as needed for wheezing or shortness of breath.   Yes Historical Provider, MD  ALPRAZolam (XANAX) 0.25 MG tablet Take 0.125 mg by mouth at bedtime as needed for sleep.  Yes Historical Provider, MD  amLODipine (NORVASC) 2.5 MG tablet Take 1 tablet (2.5 mg total) by mouth daily. 03/27/14  Yes Melony Overly, MD  cholecalciferol (VITAMIN D) 1000 UNITS tablet Take 1,000 Units by mouth 3 (three) times a week.   Yes Historical Provider, MD  letrozole (FEMARA) 2.5 MG tablet Take 2.5 mg by mouth daily.   Yes Historical Provider, MD  levothyroxine (SYNTHROID, LEVOTHROID) 75 MCG tablet Take 75 mg by mouth Daily. 04/27/11  Yes Historical  Provider, MD  warfarin (COUMADIN) 5 MG tablet Take 2.5-5 mg by mouth daily at 6 PM. Takes 2.5mg  on Mon, Wed and Fri Takes 5mg  all other days   Yes Historical Provider, MD  Zoledronic Acid (ZOMETA) 4 MG/100ML IVPB Inject 4 mg into the vein every 6 (six) weeks.   Yes Historical Provider, MD   BP 184/88  Pulse 92  Temp(Src) 98.1 F (36.7 C) (Oral)  Resp 18  Ht 5\' 8"  (1.727 m)  SpO2 93% Physical Exam  Nursing note and vitals reviewed. Constitutional: She is oriented to person, place, and time. She appears well-developed and well-nourished. No distress.  HENT:  Head: Normocephalic and atraumatic.  Right Ear: External ear normal.  Left Ear: External ear normal.  Mouth/Throat: Oropharynx is clear and moist.  Eyes: Conjunctivae and EOM are normal. Pupils are equal, round, and reactive to light. Right eye exhibits no discharge. Left eye exhibits no discharge.  Neck: Neck supple.  Cardiovascular: Normal rate, regular rhythm, normal heart sounds and intact distal pulses.  Exam reveals no gallop and no friction rub.   No murmur heard. During exam BP 147/65 and HR 92.  Pulmonary/Chest: Effort normal and breath sounds normal. No respiratory distress. She has no wheezes. She has no rales.  Abdominal: Soft. Bowel sounds are normal. She exhibits no mass. There is no tenderness. There is no rebound and no guarding.  Musculoskeletal: She exhibits no edema and no tenderness.  Lymphadenopathy:    She has no cervical adenopathy.  Neurological: She is alert and oriented to person, place, and time. No cranial nerve deficit. She exhibits normal muscle tone. Coordination normal.  CN II-XII intact   Skin: Skin is warm and dry. No rash noted. She is not diaphoretic. No erythema. No pallor.  Psychiatric: She has a normal mood and affect. Her behavior is normal.    ED Course  Procedures (including critical care time) Labs Review Labs Reviewed  CBC - Abnormal; Notable for the following:    WBC 3.6 (*)     Platelets 143 (*)    All other components within normal limits  BASIC METABOLIC PANEL - Abnormal; Notable for the following:    Potassium 3.6 (*)    Glucose, Bld 111 (*)    GFR calc non Af Amer 59 (*)    GFR calc Af Amer 69 (*)    All other components within normal limits  PROTIME-INR - Abnormal; Notable for the following:    Prothrombin Time 23.6 (*)    INR 2.09 (*)    All other components within normal limits  TROPONIN I  TROPONIN I  TROPONIN I  CBG MONITORING, ED  Randolm Idol, ED    Imaging Review Dg Chest Portable 1 View  03/29/2014   CLINICAL DATA:  Syncope.  EXAM: PORTABLE CHEST - 1 VIEW  COMPARISON:  11/12/2013.  FINDINGS: Trachea is midline. Heart size stable. Lungs are hyperinflated. Biapical pleural parenchymal scarring. No airspace consolidation or pleural fluid. No pneumothorax. Surgical clips in the  left axilla.  IMPRESSION: Hyperinflation without acute finding.   Electronically Signed   By: Lorin Picket M.D.   On: 03/29/2014 20:04     EKG Interpretation   Date/Time:  Thursday March 29 2014 18:18:50 EDT Ventricular Rate:  84 PR Interval:  193 QRS Duration: 81 QT Interval:  378 QTC Calculation: 447 R Axis:   78 Text Interpretation:  Sinus rhythm Atrial premature complexes in couplets  Minimal ST depression, anterolateral leads Abnormal ekg Since last tracing  ST findings are now present Confirmed by MILLER  MD, BRIAN ([id number]) on  03/29/2014 6:55:41 PM      Filed Vitals:   03/29/14 1821 03/29/14 2127 03/29/14 2200 03/29/14 2222  BP: 192/81 182/73 151/63 184/88  Pulse: 92 83 70 92  Temp: 97.7 F (36.5 C) 98.6 F (37 C) 98.6 F (37 C) 98.1 F (36.7 C)  TempSrc: Oral Oral Oral Oral  Resp: 14 18 16 18   Height:    5\' 8"  (1.727 m)  SpO2: 98% 95% 94% 93%     MDM     Please see medications administered in the ED in the [facility] summary.    Final diagnoses:  Syncope   Patient presents to emergency department after a syncopal episode at home. 2  previous similar syncopal episode 2 days prior to this episode. She has a history of atrial fibrillation. She reports she had palpitations prior to having the syncopal episode. She reports that in less than 1-2 minutes and when she returned she felt better and did not have palpitations. Cardiology consult was performed by Dr. Sabra Heck. Patient will be admitted to telemetry floor. Patient seen and evaluated in conjunction with Dr. Sabra Heck.   Hanley Hays, [place] 03/29/14 [phone]

## 2014-03-29 NOTE — ED Provider Notes (Signed)
80 year old female presents after having a syncopal episode at home. This lasted approximately 1 minute the patient is unsure it lasted longer. She had been having palpitations, described as a racing heartbeat, resulting in a syncopal episode while she was sitting down. She denies any vertigo or focal numbness or weakness and when she came back to she had a normal feeling in her chest without palpitations. She was recently stopped on her metoprolol as she has a known history of atrial fibrillation. 2 days ago she was at the urgent care for similar symptoms though she did not have a syncopal episode per report. On exam the patient has normal heart rate, occasional ectopy, no peripheral edema and appears very comfortable with a normal neurologic exam. Labs, imaging, anticipate observational admission for syncope with likely arrhythmia.  D/w cardiologist who will consult D/w hospitalist who will admit Pt with no further arrhythmias in ED  Medical screening examination/treatment/procedure(s) were conducted as a shared visit with non-physician practitioner(s) and myself.  I personally evaluated the patient during the encounter.  Clinical Impression:   Final diagnoses:  Syncope         Johnna Acosta, MD 03/30/14 [phone]

## 2014-03-29 NOTE — Consult Note (Cosign Needed)
CARDIOLOGY CONSULT NOTE   Patient ID: Amy White MRN: [id number], DOB/AGE: January 03, 1926   Admit date: 03/29/2014 Date of Consult: 03/29/2014   Primary Physician: Mayra Neer, MD Primary Cardiologist: [place]   CC: Presyncopal episode  Problem List  Past Medical History  Diagnosis Date  . Breast cancer metastasized to bone 05/13/2011  . Cancer   . Thyroid disease   . Atrial fibrillation     Past Surgical History  Procedure Laterality Date  . Breast surgery    . Leg surgery       Allergies  Allergies  Allergen Reactions  . Ciprofloxacin Rash    Rash in the mouth  . Penicillins Hives and Rash    HPI  The patient is an 17F with a history of metastatic breast CA to bone, PAF on warfarin therapy, HTN who presents with two presyncopal episodes. She reports that on Sunday she had an episode where she developed palpitations and then became lightheaded and as if she was about to pass out. She denies full LOC. The entire episode lasted only a few minutes. She reported to UC on 03/27/2014 and was advised to stop her low-dose metoprolol.   She had another similar episode at about 5:10p where she developed palpitations, then lightheadedness and presyncope. She again never fully lost consciousness.  From Dr. Victorino December prior notes, she has a longstanding history of PAF. She has not tolerated higher doses of BB due to frequent sinus pauses and symptomatic bradycardia. She had previously declined PPM  Family History No family history on file.   Social History History   Social History  . Marital Status: Widowed    Spouse Name: N/A    Number of Children: N/A  . Years of Education: N/A   Occupational History  . Not on file.   Social History Main Topics  . Smoking status: Never Smoker   . Smokeless tobacco: Never Used     Comment: never used tobacco  . Alcohol Use: Yes     Comment: ocassionally  . Drug Use: No  . Sexual Activity: Not on file   Other Topics  Concern  . Not on file   Social History Narrative  . No narrative on file     Review of Systems  General:  +chills, no fever, night sweats or weight changes.  Cardiovascular:  No chest pain, dyspnea on exertion, edema, orthopnea, palpitations, paroxysmal nocturnal dyspnea. Dermatological: No rash, lesions/masses Respiratory: No cough, mild dyspnea during episode Urologic: No hematuria, dysuria Abdominal:   No nausea, vomiting, diarrhea, bright red blood per rectum, melena, or hematemesis Neurologic:  No visual changes, wkns, changes in mental status. No bleeding episodes All other systems reviewed and are otherwise negative except as noted above.  Physical Exam  Blood pressure 192/81, pulse 92, temperature 97.7 F (36.5 C), temperature source Oral, resp. rate 14, SpO2 98.00%.  General: Pleasant, NAD Psych: Normal affect. Neuro: Alert and oriented X 3. Moves all extremities spontaneously. HEENT: Normal  Neck: Supple without bruits or JVD. Lungs:  Resp regular and unlabored, CTA. Heart: RRR no s3, s4, or murmurs. Abdomen: Soft, non-tender, non-distended, BS + x 4.  Extremities: No clubbing, cyanosis or edema. DP/PT/Radials 2+ and equal bilaterally.  Labs  No results found for this basename: CKTOTAL, CKMB, TROPONINI,  in the last 72 hours Lab Results  Component Value Date   WBC 3.6* 03/29/2014   HGB 12.6 03/29/2014   HCT 37.6 03/29/2014   MCV 82.8 03/29/2014   PLT 143* 03/29/2014  Recent Labs Lab 03/29/14 1835  NA 141  K 3.6*  CL 105  CO2 24  BUN 11  CREATININE 0.86  CALCIUM 9.5  GLUCOSE 111*    Radiology/Studies  Dg Chest Portable 1 View  03/29/2014   CLINICAL DATA:  Syncope.  EXAM: PORTABLE CHEST - 1 VIEW  COMPARISON:  11/12/2013.  FINDINGS: Trachea is midline. Heart size stable. Lungs are hyperinflated. Biapical pleural parenchymal scarring. No airspace consolidation or pleural fluid. No pneumothorax. Surgical clips in the left axilla.  IMPRESSION:  Hyperinflation without acute finding.   Electronically Signed   By: Lorin Picket M.D.   On: 03/29/2014 20:04   ECG SR@ 80bpm with occasional PACs, normal axis, intervals. No acute ST-TW changes  ASSESSMENT AND PLAN 42F with a history of metastatic breast CA to bone, PAF on warfarin therapy, HTN who presents with two presyncopal episodes.   #Presyncope: Unclear etiology for her symptoms but I am concerned that it may be secondary to her paroxysmal AF. She is currently in SR, and I wonder if she is having short bursts of symptomatic AF with a pause on conversion. She clearly has substrate for sick sinus syndrome. If these episodes can be captured on telemetry, then she potentially can be considered for PPM therapy. She has no other clear indication for BB or other AVN blocker therapy outside of HTN, which should be able to be treated with other agents. -Admit to observation with telemetry -Please check orthostatics -Manage HTN with lisinopril -would avoid BB or other AV nodal blockers at this time -Can consider TTE given no recent echo but likelihood of structural cause of her presyncope is low -would defer carotid duplex for now given low yield -When discharged she should be sent home with a Holter or event monitor and followup with her regular cardiologist.  Signed, Raliegh Ip, MD MPH  03/29/2014, 9:39 PM

## 2014-03-29 NOTE — H&P (Signed)
[facility]                                                                                    Patient Demographics  Amy White, is a 80 y.o. female  CSN: [id number]  MRN: [id number]  DOB - 1926-05-27  Admit Date - 03/29/2014  Outpatient Primary MD for the patient is Mayra Neer, MD   With History of -  Past Medical History  Diagnosis Date  . Breast cancer metastasized to bone 05/13/2011  . Cancer   . Thyroid disease   . Atrial fibrillation       Past Surgical History  Procedure Laterality Date  . Breast surgery    . Leg surgery      in for   Chief Complaint  Patient presents with  . Loss of Consciousness     HPI  Amy White  is a 80 y.o. female,  with past medical history significant for atrial fibrillation and metastatic breast cancer presenting with a syncopal episode this lasted around 1 minute associated with  palpitations. The patient denies any nausea, vomiting, abdominal pain, diarrhea or chest pain. The patient denies cough or shortness of breath. 2 days ago patient went to urgent care Center where her beta blocker was switched to Norvasc 2.5 mg by mouth daily. Patient denies any headaches. No history of seizure     Review of Systems    In addition to the HPI above,  No Fever-chills,  No changes with Vision or hearing, No problems swallowing food or Liquids, No Chest pain, Cough or Shortness of Breath, No Abdominal pain, No Nausea or Vommitting, Bowel movements are regular, No Blood in stool or Urine, No dysuria, No new skin rashes or bruises, No new joints pains-aches,  No new weakness, tingling, numbness in any extremity, No recent weight gain or loss, No polyuria, polydypsia or polyphagia, No significant Mental Stressors.  A full 10 point Review of Systems was done, except as stated above, all other Review of Systems were negative.   Social History History  Substance Use Topics  . Smoking status: Never Smoker   .  Smokeless tobacco: Never Used     Comment: never used tobacco  . Alcohol Use: Yes     Comment: ocassionally     Family History Colon cancer   Prior to Admission medications   Medication Sig Start Date End Date Taking? Authorizing Provider  albuterol (PROVENTIL HFA;VENTOLIN HFA) 108 (90 BASE) MCG/ACT inhaler Inhale 2 puffs into the lungs every 6 (six) hours as needed for wheezing or shortness of breath.   Yes Historical Provider, MD  ALPRAZolam (XANAX) 0.25 MG tablet Take 0.125 mg by mouth at bedtime as needed for sleep.    Yes Historical Provider, MD  amLODipine (NORVASC) 2.5 MG tablet Take 1 tablet (2.5 mg total) by mouth daily. 03/27/14  Yes Melony Overly, MD  cholecalciferol (VITAMIN D) 1000 UNITS tablet Take 1,000 Units by mouth 3 (three) times a week.   Yes Historical Provider, MD  letrozole (FEMARA) 2.5 MG tablet Take 2.5 mg by mouth daily.   Yes Historical Provider, MD  levothyroxine (SYNTHROID, LEVOTHROID) 75 MCG tablet Take 75  mg by mouth Daily. 04/27/11  Yes Historical Provider, MD  warfarin (COUMADIN) 5 MG tablet Take 2.5-5 mg by mouth daily at 6 PM. Takes 2.5mg  on Mon, Wed and Fri Takes 5mg  all other days   Yes Historical Provider, MD  Zoledronic Acid (ZOMETA) 4 MG/100ML IVPB Inject 4 mg into the vein every 6 (six) weeks.   Yes Historical Provider, MD    Allergies  Allergen Reactions  . Ciprofloxacin Rash    Rash in the mouth  . Penicillins Hives and Rash    Physical Exam  Vitals  Blood pressure 192/81, pulse 92, temperature 97.7 F (36.5 C), temperature source Oral, resp. rate 14, SpO2 98.00%.   1. General  well-developed, well-nourished elderly female in no acute distress, very pleasant .  2. Normal affect and insight, Not Suicidal or Homicidal, Awake Alert, Oriented X 3.  3. No F.N deficits, ALL C.Nerves Intact, Strength 5/5 all 4 extremities,   4. Ears and Eyes appear Normal, Conjunctivae clear, PERRLA. Moist Oral Mucosa.  5. Supple Neck, No JVD, No  cervical lymphadenopathy appriciated, No Carotid Bruits.  6. Symmetrical Chest wall movement, Good air movement bilaterally, CTAB.  7. RRR, No Gallops, Rubs or Murmurs, No Parasternal Heave.  8. Positive Bowel Sounds, Abdomen Soft, Non tender, No organomegaly appriciated,No rebound -guarding or rigidity.  9.  No Cyanosis, Normal Skin Turgor, No Skin Rash or Bruise.  10. Good muscle tone,  joints appear normal , no effusions, Normal ROM.  11. No Palpable Lymph Nodes in Neck or Axillae    Data Review  CBC  Recent Labs Lab 03/29/14 1835  WBC 3.6*  HGB 12.6  HCT 37.6  PLT 143*  MCV 82.8  MCH 27.8  MCHC 33.5  RDW 14.0   ------------------------------------------------------------------------------------------------------------------  Chemistries   Recent Labs Lab 03/29/14 1835  NA 141  K 3.6*  CL 105  CO2 24  GLUCOSE 111*  BUN 11  CREATININE 0.86  CALCIUM 9.5   ------------------------------------------------------------------------------------------------------------------ CrCl is unknown because both a height and weight (above a minimum accepted value) are required for this calculation. ------------------------------------------------------------------------------------------------------------------ No results found for this basename: TSH, T4TOTAL, FREET3, T3FREE, THYROIDAB,  in the last 72 hours   Coagulation profile  Recent Labs Lab 03/29/14 1835  INR 2.09*   ------------------------------------------------------------------------------------------------------------------- No results found for this basename: DDIMER,  in the last 72 hours -------------------------------------------------------------------------------------------------------------------  Cardiac Enzymes No results found for this basename: CK, CKMB, TROPONINI, MYOGLOBIN,  in the last 168  hours ------------------------------------------------------------------------------------------------------------------ No components found with this basename: POCBNP,    Imaging results:   Dg Chest Portable 1 View  03/29/2014   CLINICAL DATA:  Syncope.  EXAM: PORTABLE CHEST - 1 VIEW  COMPARISON:  11/12/2013.  FINDINGS: Trachea is midline. Heart size stable. Lungs are hyperinflated. Biapical pleural parenchymal scarring. No airspace consolidation or pleural fluid. No pneumothorax. Surgical clips in the left axilla.  IMPRESSION: Hyperinflation without acute finding.   Electronically Signed   By: Lorin Picket M.D.   On: 03/29/2014 20:04    My personal review of EKG: Rhythm NSR, 84 beats per minute with PACs and nonspecific ST wave changes   Assessment & Plan  1. syncopal episode; probably related to atrial fib     Observe in telemetry          Serial troponin     Check echo      Neurochecks      Cardiology in consult  2. Hypertension     Increase Norvasc  ACE inhibitor  3. History of metastatic breast cancer     Follow as outpatient        DVT Prophylaxis :   AM Labs Ordered, also please review Full Orders  Family Communication: Admission, patients condition and plan of care including tests being ordered have been discussed with the patient and husband who indicate understanding and agree with the plan and Code Status.  Code Status full  Disposition Plan: Home  Time spent in minutes : 36 minutes  Condition GUARDED    @SIGNATURE @

## 2014-03-30 ENCOUNTER — Encounter (HOSPITAL_COMMUNITY): Payer: Self-pay | Admitting: General Practice

## 2014-03-30 DIAGNOSIS — E079 Disorder of thyroid, unspecified: Secondary | ICD-10-CM | POA: Diagnosis not present

## 2014-03-30 DIAGNOSIS — C7951 Secondary malignant neoplasm of bone: Secondary | ICD-10-CM

## 2014-03-30 DIAGNOSIS — R55 Syncope and collapse: Secondary | ICD-10-CM

## 2014-03-30 DIAGNOSIS — I369 Nonrheumatic tricuspid valve disorder, unspecified: Secondary | ICD-10-CM

## 2014-03-30 DIAGNOSIS — C50919 Malignant neoplasm of unspecified site of unspecified female breast: Secondary | ICD-10-CM

## 2014-03-30 DIAGNOSIS — R42 Dizziness and giddiness: Secondary | ICD-10-CM | POA: Diagnosis not present

## 2014-03-30 DIAGNOSIS — I480 Paroxysmal atrial fibrillation: Secondary | ICD-10-CM

## 2014-03-30 DIAGNOSIS — I4891 Unspecified atrial fibrillation: Secondary | ICD-10-CM | POA: Diagnosis not present

## 2014-03-30 DIAGNOSIS — I10 Essential (primary) hypertension: Secondary | ICD-10-CM

## 2014-03-30 LAB — TSH: TSH: 1.24 u[IU]/mL (ref 0.350–4.500)

## 2014-03-30 LAB — TROPONIN I
Troponin I: <0.3 ng/mL (ref <0.30)
Troponin I: <0.3 ng/mL (ref <0.30)
Troponin I: <0.3 ng/mL (ref <0.30)

## 2014-03-30 MED ORDER — WARFARIN - PHARMACIST DOSING INPATIENT: Freq: Every day | Status: DC

## 2014-03-30 MED ORDER — WARFARIN SODIUM 5 MG PO TABS
5.0000 mg | ORAL_TABLET | Freq: Once | ORAL | Status: AC
  Administered 2014-03-30: 5 mg via ORAL
  Filled 2014-03-30: qty 1

## 2014-03-30 NOTE — Progress Notes (Signed)
ANTICOAGULATION CONSULT NOTE - Initial Consult  Pharmacy Consult for Coumadin Indication: atrial fibrillation  Allergies  Allergen Reactions  . Ciprofloxacin Rash    Rash in the mouth  . Penicillins Hives and Rash    Patient Measurements: Height: 5\' 8"  (172.7 cm) Weight: 162 lb 9.6 oz (73.755 kg) IBW/kg (Calculated) : 63.9  Vital Signs: Temp: 98.4 F (36.9 C) (10/23 1445) Temp Source: Oral (10/23 1445) BP: 123/56 mmHg (10/23 1445) Pulse Rate: 68 (10/23 1445)  Labs:  Recent Labs  03/29/14 1835 03/29/14 2351 03/30/14 0410 03/30/14 1014  HGB 12.6  --   --   --   HCT 37.6  --   --   --   PLT 143*  --   --   --   LABPROT 23.6*  --   --   --   INR 2.09*  --   --   --   CREATININE 0.86  --   --   --   TROPONINI  --  <0.30 <0.30 <0.30    Estimated Creatinine Clearance: 47.4 ml/min (by C-G formula based on Cr of 0.86).   Medical History: Past Medical History  Diagnosis Date  . Breast cancer metastasized to bone 05/13/2011  . Cancer   . Thyroid disease   . Atrial fibrillation     Medications:  Prescriptions prior to admission  Medication Sig Dispense Refill  . albuterol (PROVENTIL HFA;VENTOLIN HFA) 108 (90 BASE) MCG/ACT inhaler Inhale 2 puffs into the lungs every 6 (six) hours as needed for wheezing or shortness of breath.      . ALPRAZolam (XANAX) 0.25 MG tablet Take 0.125 mg by mouth at bedtime as needed for sleep.       Marland Kitchen amLODipine (NORVASC) 2.5 MG tablet Take 1 tablet (2.5 mg total) by mouth daily.  30 tablet  0  . cholecalciferol (VITAMIN D) 1000 UNITS tablet Take 1,000 Units by mouth 3 (three) times a week.      . letrozole (FEMARA) 2.5 MG tablet Take 2.5 mg by mouth daily.      Marland Kitchen levothyroxine (SYNTHROID, LEVOTHROID) 75 MCG tablet Take 75 mg by mouth Daily.      Marland Kitchen warfarin (COUMADIN) 5 MG tablet Take 2.5-5 mg by mouth daily at 6 PM. Takes 2.5mg  on Mon, Wed and Fri Takes 5mg  all other days      . Zoledronic Acid (ZOMETA) 4 MG/100ML IVPB Inject 4 mg into the  vein every 6 (six) weeks.        Assessment: 80 yo F on Coumadin PTA for hx PAF.  INR on admission is 2.09.  Last Coumadin dose was 10/21.  Pt was admitted following 2 presyncopal episodes, pending cardiology work-up.  Goal of Therapy:  INR 2-3 Monitor platelets by anticoagulation protocol: Yes   Plan:  With missed Coumadin dose 10/22 will five Coumadin 5mg  Po x 1 tonight instead of home dose of 2.5mg .  Follow up with INR in AM.  Horton Chin, Pharm.D., BCPS Clinical Pharmacist Pager [phone] 03/30/2014 3:15 PM

## 2014-03-30 NOTE — Progress Notes (Signed)
*  PRELIMINARY RESULTS* Echocardiogram 2D Echocardiogram has been performed.  Amy White 03/30/2014, 10:15 AM

## 2014-03-30 NOTE — Progress Notes (Signed)
UR completed 

## 2014-03-30 NOTE — Progress Notes (Signed)
PROGRESS NOTE    Amy White [id number] DOB: 09-Aug-1925 DOA: 03/29/2014 PCP: Mayra Neer, MD Oncology: Dr. Burney Gauze  HPI/Brief narrative 80 year old female with history of PAF on warfarin, HTN, metastatic breast cancer to bone, presented to the hospital with 2 pre-syncopal episodes over the last 2-3 days. She denies complete loss of consciousness. These episodes were preceded by palpitations, lightheadedness, or darkness in front of her eyes and she laid down with resolution of symptoms after a few minutes. Her metoprolol was recently stopped and she was started on amlodipine. As per cardiology, patient has not tolerated higher doses of beta blocker due to frequent sinus pauses and symptomatic bradycardia. She has previously declined PPM.  Assessment/Plan:  1. Presyncope: DD-orthostatic hypotension, PAF with RVR, bradycardia versus other etiology. No focal deficits clinically. Telemetry-sinus rhythm without arrhythmias. Mildly orthostatic BP's- recheck in am. Clinically euvolemic. Cardiology consultation and followup appreciated-concerned regarding short bursts of symptomatic A. fib with a pause on conversion. Avoid all AV nodal blocking agents.followup 2-D echo results. Outpatient heart monitor at discharge. Check TSH-no recent one in system. 2. Hypertension: Mildly uncontrolled. Continue amlodipine and lisinopril. May consider titrating lisinopril if remains uncontrolled. 3. History of metastatic breast cancer: Outpatient followup with oncology. Continue letrozole 4. PAF: Beta blockers discontinued. Continue Coumadin per pharmacy. SR on monitor. 5. Hypothyroid: Continue Synthroid. Check TSH   Code Status: Full Family Communication: Discussed with fiance at bedside. Disposition Plan: Home when medically stable.   Consultants:  Cardiology  Procedures:  None   Antibiotics:  None   Subjective: Denies complaints. Denies chest pain, dizziness or  lightheadedness.  Objective: Filed Vitals:   03/29/14 2200 03/29/14 2222 03/30/14 0612 03/30/14 1445  BP: 151/63 184/88 148/64 123/56  Pulse: 70 92 73 68  Temp: 98.6 F (37 C) 98.1 F (36.7 C) 97.5 F (36.4 C) 98.4 F (36.9 C)  TempSrc: Oral Oral Oral Oral  Resp: 16 18  18   Height:  5\' 8"  (1.727 m)    Weight:   73.755 kg (162 lb 9.6 oz)   SpO2: 94% 93% 95% 97%    Intake/Output Summary (Last 24 hours) at 03/30/14 1525 Last data filed at 03/30/14 0900  Gross per 24 hour  Intake    200 ml  Output      0 ml  Net    200 ml   Filed Weights   03/30/14 0612  Weight: 73.755 kg (162 lb 9.6 oz)     Exam:  General exam: Pleasant elderly female lying comfortably in bed. Respiratory system: Clear. No increased work of breathing. Cardiovascular system: S1 & S2 heard, RRR. No JVD, murmurs, gallops, clicks or pedal edema. Telemetry: Sinus rhythm. Gastrointestinal system: Abdomen is nondistended, soft and nontender. Normal bowel sounds heard. Central nervous system: Alert and oriented. No focal neurological deficits. Extremities: Symmetric 5 x 5 power.   Data Reviewed: Basic Metabolic Panel:  Recent Labs Lab 03/29/14 1835  NA 141  K 3.6*  CL 105  CO2 24  GLUCOSE 111*  BUN 11  CREATININE 0.86  CALCIUM 9.5   Liver Function Tests: No results found for this basename: AST, ALT, ALKPHOS, BILITOT, PROT, ALBUMIN,  in the last 168 hours No results found for this basename: LIPASE, AMYLASE,  in the last 168 hours No results found for this basename: AMMONIA,  in the last 168 hours CBC:  Recent Labs Lab 03/29/14 1835  WBC 3.6*  HGB 12.6  HCT 37.6  MCV 82.8  PLT 143*   Cardiac Enzymes:  Recent Labs Lab 03/29/14 2351 03/30/14 0410 03/30/14 1014  TROPONINI <0.30 <0.30 <0.30   BNP (last 3 results) No results found for this basename: PROBNP,  in the last 8760 hours CBG:  Recent Labs Lab 03/29/14 1956  GLUCAP 97    No results found for this or any previous visit  (from the past 240 hour(s)).       Studies: Dg Chest Portable 1 View  03/29/2014   CLINICAL DATA:  Syncope.  EXAM: PORTABLE CHEST - 1 VIEW  COMPARISON:  11/12/2013.  FINDINGS: Trachea is midline. Heart size stable. Lungs are hyperinflated. Biapical pleural parenchymal scarring. No airspace consolidation or pleural fluid. No pneumothorax. Surgical clips in the left axilla.  IMPRESSION: Hyperinflation without acute finding.   Electronically Signed   By: Lorin Picket M.D.   On: 03/29/2014 20:04        Scheduled Meds: . amLODipine  5 mg Oral Daily  . cholecalciferol  1,000 Units Oral Once per day on Mon Wed Fri  . letrozole  2.5 mg Oral Daily  . levothyroxine  75 mcg Oral QAC breakfast  . lisinopril  10 mg Oral Daily  . sodium chloride  3 mL Intravenous Q12H  . sodium chloride  3 mL Intravenous Q12H  . warfarin  5 mg Oral ONCE-1800  . Warfarin - Pharmacist Dosing Inpatient   Does not apply q1800   Continuous Infusions:   Principal Problem:   Near syncope Active Problems:   Breast cancer metastasized to bone   Atrial fibrillation   Long term current use of anticoagulant therapy   SSS (sick sinus syndrome)    Time spent: 30 minutes    Katasha Riga, MD, FACP, FHM. [facility] Pager [phone]  If 7PM-7AM, please contact night-coverage [email or web address] Password [email or web address] 03/30/2014, 3:25 PM    LOS: 1 day

## 2014-03-30 NOTE — ED Provider Notes (Signed)
Medical screening examination/treatment/procedure(s) were conducted as a shared visit with non-physician practitioner(s) and myself.  I personally evaluated the patient during the encounter  Please see my separate respective documentation pertaining to this patient encounter   Johnna Acosta, MD 03/30/14 [phone]

## 2014-03-30 NOTE — Progress Notes (Signed)
Patient Profile: 68F with a history of metastatic breast CA to bone, PAF on warfarin therapy, HTN who presents with two presyncopal episodes.    Subjective: Feels ok currently. Has had mild near syncopal symptoms since admit but no frank syncope.   Objective: Vital signs in last 24 hours: Temp:  [97.5 F (36.4 C)-98.6 F (37 C)] 97.5 F (36.4 C) (10/23 0612) Pulse Rate:  [70-92] 73 (10/23 0612) Resp:  [14-18] 18 (10/22 2222) BP: (148-192)/(63-88) 148/64 mmHg (10/23 0612) SpO2:  [93 %-98 %] 95 % (10/23 0612) Weight:  [162 lb 9.6 oz (73.755 kg)] 162 lb 9.6 oz (73.755 kg) (10/23 0612)    Intake/Output from previous day:   Intake/Output this shift: Total I/O In: 200 [P.O.:200] Out: -   Medications Current Facility-Administered Medications  Medication Dose Route Frequency Provider Last Rate Last Dose  . 0.9 %  sodium chloride infusion  250 mL Intravenous PRN Merton Border, MD      . acetaminophen (TYLENOL) tablet 650 mg  650 mg Oral Q6H PRN Merton Border, MD       Or  . acetaminophen (TYLENOL) suppository 650 mg  650 mg Rectal Q6H PRN Merton Border, MD      . albuterol (PROVENTIL) (2.5 MG/3ML) 0.083% nebulizer solution 2.5 mg  2.5 mg Nebulization Q6H PRN Merton Border, MD      . ALPRAZolam Duanne Moron) tablet 0.125 mg  0.125 mg Oral QHS PRN Merton Border, MD      . amLODipine (NORVASC) tablet 5 mg  5 mg Oral Daily Merton Border, MD   5 mg at 03/30/14 1046  . cholecalciferol (VITAMIN D) tablet 1,000 Units  1,000 Units Oral Once per day on Mon Wed Fri Merton Border, MD   1,000 Units at 03/30/14 1046  . letrozole [facility]) tablet 2.5 mg  2.5 mg Oral Daily Merton Border, MD      . levothyroxine (SYNTHROID, LEVOTHROID) tablet 75 mcg  75 mcg Oral QAC breakfast Merton Border, MD   75 mcg at 03/30/14 0746  . lisinopril (PRINIVIL,ZESTRIL) tablet 10 mg  10 mg Oral Daily Merton Border, MD   10 mg at 03/30/14 1057  . metoprolol (LOPRESSOR) injection 2.5 mg  2.5 mg Intravenous Q6H PRN Merton Border, MD      . morphine 2 MG/ML  injection 1 mg  1 mg Intravenous Q4H PRN Merton Border, MD      . ondansetron (ZOFRAN) tablet 4 mg  4 mg Oral Q6H PRN Merton Border, MD      . oxyCODONE (Oxy IR/ROXICODONE) immediate release tablet 5 mg  5 mg Oral Q4H PRN Merton Border, MD      . sodium chloride 0.9 % injection 3 mL  3 mL Intravenous Q12H Merton Border, MD   3 mL at 03/30/14 1047  . sodium chloride 0.9 % injection 3 mL  3 mL Intravenous Q12H Merton Border, MD   3 mL at 03/30/14 1047  . sodium chloride 0.9 % injection 3 mL  3 mL Intravenous PRN Merton Border, MD      . zolpidem (AMBIEN) tablet 5 mg  5 mg Oral QHS PRN Merton Border, MD        PE: General appearance: alert, cooperative and no distress Neck: no carotid bruit and no JVD Lungs: clear to auscultation bilaterally Heart: regular rate and rhythm, S1, S2 normal, no murmur, click, rub or gallop Extremities: no LEE Pulses: 2+ and symmetric Skin: warm and dry Neurologic: Grossly normal  Lab Results:   Recent Labs  03/29/14 1835  WBC 3.6*  HGB 12.6  HCT 37.6  PLT 143*   BMET  Recent Labs  03/29/14 1835  NA 141  K 3.6*  CL 105  CO2 24  GLUCOSE 111*  BUN 11  CREATININE 0.86  CALCIUM 9.5   PT/INR  Recent Labs  03/29/14 1835  LABPROT 23.6*  INR 2.09*   Cardiac Panel (last 3 results)  Recent Labs  03/29/14 2351 03/30/14 0410 03/30/14 1014  TROPONINI <0.30 <0.30 <0.30    Assessment/Plan  Active Problems:   Syncope  1. Presyncope: Cardiac enzymes negative x 3. EKG demonstrates NSR. No tachy/brady arrhthymias on telemetry. Orthostatics not checked (dont see documented standing pressures). 2D echo pending. Physical exam negative for carotid bruits. Plan for outpatient holter monitoring if w/u remains unremarkable.     LOS: 1 day    Brittainy M. Ladoris Gene 03/30/2014 12:31 PM   Patient seen and examined. Agree with assessment and plan. Echo study completed results not yet available. Trop negative. Orthostatics assessment. Agree with plan for  outpatient monitor post dc.   Troy Sine, MD, [facility] 03/30/2014 1:15 PM

## 2014-03-31 LAB — CBC
HCT: 37.9 % (ref 36.0–46.0)
Hemoglobin: 12.6 g/dL (ref 12.0–15.0)
MCH: 28.3 pg (ref 26.0–34.0)
MCHC: 33.2 g/dL (ref 30.0–36.0)
MCV: 85.2 fL (ref 78.0–100.0)
Platelets: 165 10*3/uL (ref 150–400)
RBC: 4.45 MIL/uL (ref 3.87–5.11)
RDW: 14.2 % (ref 11.5–15.5)
WBC: 4.1 10*3/uL (ref 4.0–10.5)

## 2014-03-31 LAB — PROTIME-INR
INR: 1.63 — ABNORMAL HIGH (ref 0.00–1.49)
Prothrombin Time: 19.5 seconds — ABNORMAL HIGH (ref 11.6–15.2)

## 2014-03-31 MED ORDER — WARFARIN SODIUM 5 MG PO TABS
5.0000 mg | ORAL_TABLET | Freq: Once | ORAL | Status: AC
  Administered 2014-03-31: 5 mg via ORAL
  Filled 2014-03-31: qty 1

## 2014-03-31 MED ORDER — AMLODIPINE BESYLATE 2.5 MG PO TABS
2.5000 mg | ORAL_TABLET | Freq: Every day | ORAL | Status: DC
  Administered 2014-04-01: 2.5 mg via ORAL
  Filled 2014-03-31: qty 1

## 2014-03-31 NOTE — Progress Notes (Signed)
Subjective:  Complain of one episode of dizziness associated with some mild room spinning earlier today. No other episodes. Mild orthostasis noted.  Objective:  Vital Signs in the last 24 hours: BP 132/64  Pulse 77  Temp(Src) 97.9 F (36.6 C) (Oral)  Resp 18  Ht 5\' 8"  (1.727 m)  Wt 73.347 kg (161 lb 11.2 oz)  BMI 24.59 kg/m2  SpO2 97%  Physical Exam: Pleasant elderly female in no acute distress Lungs:  Clear  Cardiac:  Regular rhythm, normal S1 and S2, no S3 Extremities:  No edema present  Intake/Output from previous day: 10/23 0701 - 10/24 0700 In: 200 [P.O.:200] Out: -  Weight Filed Weights   03/30/14 0612 03/31/14 0634  Weight: 73.755 kg (162 lb 9.6 oz) 73.347 kg (161 lb 11.2 oz)    Lab Results: Basic Metabolic Panel:  Recent Labs  03/29/14 1835  NA 141  K 3.6*  CL 105  CO2 24  GLUCOSE 111*  BUN 11  CREATININE 0.86    CBC:  Recent Labs  03/29/14 1835 03/31/14 0324  WBC 3.6* 4.1  HGB 12.6 12.6  HCT 37.6 37.9  MCV 82.8 85.2  PLT 143* 165   PROTIME: Lab Results  Component Value Date   INR 1.63* 03/31/2014   INR 2.09* 03/29/2014   INR 1.9 03/12/2014    Telemetry: Currently sinus rhythm. No atrial fibrillation. No pauses noted  Assessment/Plan:  1. Dizziness and presyncope-possibly orthostasis 2. Hypertension 3. Paroxysmal atrial fibrillation For prior long-term use of anticoagulation  Recommendations:  Note orthostatic blood pressures this morning with systolic blood pressures around 100. No arrhythmia noted. Echocardiogram shows good LV systolic function.  I would reduce her amlodipine 2.5 mg daily and try to get a standing blood pressure of less than 130. At her age may need a slightly higher resting blood pressure lying. May need to wear support stockings.  Watch overnight. May be able to go home on a cardiac event monitor tomorrow.     Kerry Hough  MD [facility] Cardiology  03/31/2014, 12:28 PM

## 2014-03-31 NOTE — Progress Notes (Signed)
UR completed 

## 2014-03-31 NOTE — Progress Notes (Signed)
PROGRESS NOTE    Amy White [id number] DOB: [birth year] DOA: 03/29/2014 PCP: Mayra Neer, MD Oncology: Dr. Burney Gauze  HPI/Brief narrative 80 year old female with history of PAF on warfarin, HTN, metastatic breast cancer to bone, presented to the hospital with 2 pre-syncopal episodes over the last 2-3 days. She denies complete loss of consciousness. These episodes were preceded by palpitations, lightheadedness, or darkness in front of her eyes and she laid down with resolution of symptoms after a few minutes. Her metoprolol was recently stopped and she was started on amlodipine. As per cardiology, patient has not tolerated higher doses of beta blocker due to frequent sinus pauses and symptomatic bradycardia. She has previously declined PPM.  Assessment/Plan:  1. Presyncope: DD-orthostatic hypotension, PAF with RVR, bradycardia versus other etiology. No focal deficits clinically. Telemetry-sinus rhythm without arrhythmias. Mildly orthostatic BP's: SBP dropped from 114 lying to 89 standing. Clinically euvolemic. Cardiology consultation and followup appreciated-concerned regarding short bursts of symptomatic A. fib with a pause on conversion. Avoid all AV nodal blocking agents. 2-D echo: LVEF 60% . Outpatient heart monitor at discharge. Check TSH- 1.240. Agree with cardiology reducing amlodipine to 2.5 mg daily. Will place bilateral thigh-high compression stockings. 2. Hypertension: Mildly uncontrolled. Continue amlodipine-dosed reduced and lisinopril.  3. History of metastatic breast cancer: Outpatient followup with oncology. Continue letrozole 4. PAF: Beta blockers discontinued. Continue Coumadin per pharmacy. SR on monitor. INR subtherapeutic-Coumadin per pharmacy  5. Hypothyroid: Continue Synthroid.  TSH normal.    Code Status: Full Family Communication:  none at bedside.  Disposition Plan: Home  possibly 10/25    Consultants:  Cardiology  Procedures:  None    Antibiotics:  None   Subjective:  slightly dizzy on ambulating but much improved compared to admission.   Objective: Filed Vitals:   03/31/14 0634 03/31/14 0904 03/31/14 1044 03/31/14 1357  BP:  110/58 132/64 104/50  Pulse:  77  72  Temp: 97.9 F (36.6 C)   97.6 F (36.4 C)  TempSrc: Oral   Oral  Resp:    16  Height:      Weight: 73.347 kg (161 lb 11.2 oz)     SpO2:    96%    Intake/Output Summary (Last 24 hours) at 03/31/14 1444 Last data filed at 03/31/14 1300  Gross per 24 hour  Intake    600 ml  Output      0 ml  Net    600 ml   Filed Weights   03/30/14 0612 03/31/14 0634  Weight: 73.755 kg (162 lb 9.6 oz) 73.347 kg (161 lb 11.2 oz)     Exam:  General exam: Pleasant elderly female lying comfortably in bed. Respiratory system: Clear. No increased work of breathing. Cardiovascular system: S1 & S2 heard, RRR. No JVD, murmurs, gallops, clicks or pedal edema. Telemetry: Sinus rhythm. Gastrointestinal system: Abdomen is nondistended, soft and nontender. Normal bowel sounds heard. Central nervous system: Alert and oriented. No focal neurological deficits. Extremities: Symmetric 5 x 5 power.   Data Reviewed: Basic Metabolic Panel:  Recent Labs Lab 03/29/14 1835  NA 141  K 3.6*  CL 105  CO2 24  GLUCOSE 111*  BUN 11  CREATININE 0.86  CALCIUM 9.5   Liver Function Tests: No results found for this basename: AST, ALT, ALKPHOS, BILITOT, PROT, ALBUMIN,  in the last 168 hours No results found for this basename: LIPASE, AMYLASE,  in the last 168 hours No results found for this basename: AMMONIA,  in the last 168 hours CBC:  Recent Labs Lab 03/29/14 1835 03/31/14 0324  WBC 3.6* 4.1  HGB 12.6 12.6  HCT 37.6 37.9  MCV 82.8 85.2  PLT 143* 165   Cardiac Enzymes:  Recent Labs Lab 03/29/14 2351 03/30/14 0410 03/30/14 1014  TROPONINI <0.30 <0.30 <0.30   BNP (last 3 results) No results found for this basename: PROBNP,  in the last 8760  hours CBG:  Recent Labs Lab 03/29/14 [place] 97    No results found for this or any previous visit (from the past 240 hour(s)).       Studies: Dg Chest Portable 1 View  03/29/2014   CLINICAL DATA:  Syncope.  EXAM: PORTABLE CHEST - 1 VIEW  COMPARISON:  11/12/2013.  FINDINGS: Trachea is midline. Heart size stable. Lungs are hyperinflated. Biapical pleural parenchymal scarring. No airspace consolidation or pleural fluid. No pneumothorax. Surgical clips in the left axilla.  IMPRESSION: Hyperinflation without acute finding.   Electronically Signed   By: Lorin Picket M.D.   On: 03/29/2014 20:04        Scheduled Meds: . [START ON 04/01/2014] amLODipine  2.5 mg Oral Daily  . cholecalciferol  1,000 Units Oral Once per day on Mon Wed Fri  . letrozole  2.5 mg Oral Daily  . levothyroxine  75 mcg Oral QAC breakfast  . lisinopril  10 mg Oral Daily  . sodium chloride  3 mL Intravenous Q12H  . sodium chloride  3 mL Intravenous Q12H  . warfarin  5 mg Oral ONCE-1800  . Warfarin - Pharmacist Dosing Inpatient   Does not apply q1800   Continuous Infusions:   Principal Problem:   Near syncope Active Problems:   Breast cancer metastasized to bone   Atrial fibrillation   Long term current use of anticoagulant therapy   SSS (sick sinus syndrome)    Time spent: 20 minutes    HONGALGI,ANAND, MD, FACP, FHM. [facility] Pager [phone]  If 7PM-7AM, please contact night-coverage [email or web address] Password [email or web address] 03/31/2014, 2:44 PM    LOS: 2 days

## 2014-03-31 NOTE — Progress Notes (Signed)
ANTICOAGULATION CONSULT NOTE - Follow Up Consult  Pharmacy Consult for warfarin Indication: atrial fibrillation  Allergies  Allergen Reactions  . Ciprofloxacin Rash    Rash in the mouth  . Penicillins Hives and Rash    Patient Measurements: Height: 5\' 8"  (172.7 cm) Weight: 161 lb 11.2 oz (73.347 kg) IBW/kg (Calculated) : 63.9  Vital Signs: Temp: 97.9 F (36.6 C) (10/24 0634) Temp Source: Oral (10/24 0634) BP: 110/58 mmHg (10/24 0904) Pulse Rate: 77 (10/24 0904)  Labs:  Recent Labs  03/29/14 1835 03/29/14 2351 03/30/14 0410 03/30/14 1014 03/31/14 0324  HGB 12.6  --   --   --  12.6  HCT 37.6  --   --   --  37.9  PLT 143*  --   --   --  165  LABPROT 23.6*  --   --   --  19.5*  INR 2.09*  --   --   --  1.63*  CREATININE 0.86  --   --   --   --   TROPONINI  --  <0.30 <0.30 <0.30  --     Estimated Creatinine Clearance: 47.4 ml/min (by C-G formula based on Cr of 0.86).   Medications:  Scheduled:  . amLODipine  5 mg Oral Daily  . cholecalciferol  1,000 Units Oral Once per day on Mon Wed Fri  . letrozole  2.5 mg Oral Daily  . levothyroxine  75 mcg Oral QAC breakfast  . lisinopril  10 mg Oral Daily  . sodium chloride  3 mL Intravenous Q12H  . sodium chloride  3 mL Intravenous Q12H  . Warfarin - Pharmacist Dosing Inpatient   Does not apply q1800    Assessment: 80 yo F on warfarin PTA for PAF. Home dose 2.5mg  MWF and 5mg  TThSatSun. INR on admission 2.09. Missed dose 10/22, 5mg  given 10/23. INR subtherapeutic today at 1.63. Hgb/Hct/PLTC WNL. No bleeding noted.  Goal of Therapy:  INR 2-3 Monitor platelets by anticoagulation protocol: Yes   Plan:  Warfarin 5mg  x 1 tonight Daily INR Monitory s/sx of bleeding  Thank you for allowing pharmacy to be part of this patient's care team  [place], Pharm.D Clinical Pharmacy Resident Pager: [phone] 03/31/2014 .9:30 AM

## 2014-04-01 LAB — PROTIME-INR
INR: 1.67 — ABNORMAL HIGH (ref 0.00–1.49)
Prothrombin Time: 19.9 seconds — ABNORMAL HIGH (ref 11.6–15.2)

## 2014-04-01 MED ORDER — WARFARIN SODIUM 7.5 MG PO TABS
7.5000 mg | ORAL_TABLET | Freq: Once | ORAL | Status: AC
  Administered 2014-04-01: 7.5 mg via ORAL
  Filled 2014-04-01: qty 1

## 2014-04-01 MED ORDER — LISINOPRIL 10 MG PO TABS: 10.0000 mg | ORAL_TABLET | Freq: Every day | ORAL | Status: DC

## 2014-04-01 MED ORDER — WARFARIN SODIUM 3 MG PO TABS: 6.0000 mg | ORAL_TABLET | Freq: Once | ORAL | Status: DC

## 2014-04-01 NOTE — Discharge Summary (Signed)
Physician Discharge Summary  Amy White [id number] DOB: 22-Apr-1926 DOA: 03/29/2014  PCP: Mayra Neer, MD  Admit date: 03/29/2014 Discharge date: 04/01/2014  Time spent: Less than 30 minutes  Recommendations for Outpatient Follow-up:  1. Dr. Sanda Klein, Cardiology: Keep prior appointment on 04/03/14 at 9:45 AM. He can arrange for outpatient heart monitor during that visit. 2. Ms. Tommy Medal, Coumadin clinic: On 04/03/14-to be seen with repeat labs PT & INR and Coumadin dose to be adjusted as needed. 3. Dr. Mayra Neer, PCP in 1 week.  Discharge Diagnoses:  Principal Problem:   Near syncope Active Problems:   Breast cancer metastasized to bone   Atrial fibrillation   Long term current use of anticoagulant therapy   SSS (sick sinus syndrome)   Discharge Condition: Improved & Stable  Diet recommendation: Heart healthy diet.  Filed Weights   03/30/14 0612 03/31/14 0634  Weight: 73.755 kg (162 lb 9.6 oz) 73.347 kg (161 lb 11.2 oz)    History of present illness:  80 year old female with history of PAF on warfarin, HTN, metastatic breast cancer to bone, presented to the hospital with 2 pre-syncopal episodes over the last 2-3 days. She denies complete loss of consciousness. These episodes were preceded by palpitations, lightheadedness, or darkness in front of her eyes and she laid down with resolution of symptoms after a few minutes. Her metoprolol was recently stopped and she was started on amlodipine. As per cardiology, patient has not tolerated higher doses of beta blocker due to frequent sinus pauses and symptomatic bradycardia. She has previously declined PPM.  Hospital Course:   1. Presyncope: DD-orthostatic hypotension, PAF with RVR, bradycardia versus other etiology. No focal deficits clinically. Telemetry-sinus rhythm without arrhythmias. Clinically euvolemic. Cardiology consultation and followup appreciated-concerned regarding short bursts of symptomatic  A. fib with a pause on conversion-none seen during this hospitalization. Avoid all AV nodal blocking agents. 2-D echo: LVEF 60% . Outpatient heart monitor at discharge. Check TSH- 1.240. Continue amlodipine to 2.5 mg daily without increasing dose. Will place bilateral thigh-high compression stockings. 2. Orthostatic hypotension: Clinically euvolemic. SBP did drop from 137>107, lying > standing. Counseled regarding activity precautions. Continue bilateral lower extremity compression stockings. OP close follow up- may have DC amlodipine if persists. 3. Hypertension: Mildly uncontrolled. Continue amlodipine-dosed reduced to prior home dose and lisinopril newly added.  4. History of metastatic breast cancer: Outpatient followup with oncology. Continue letrozole 5. PAF: Beta blockers discontinued. Continue Coumadin per pharmacy. SR on monitor. INR subtherapeutic-patient will get slightly higher dose of 7.5 mg prior to discharge and then can continue prior home dose. Followup with Coumadin clinic on 10/27 for further management. 6. Hypothyroid: Continue Synthroid. TSH normal.    Consultations:  Cardiology  Procedures:  None    Discharge Exam:  Complaints:  Denies complaints. Denies dizziness or lightheadedness. States that she ambulated the halls with supervision without symptoms. Indicates that she had palpitations for a few seconds this morning-did not see abnormality on telemetry.  Filed Vitals:   03/31/14 2052 03/31/14 2215 04/01/14 0534 04/01/14 0936  BP: 148/54 153/68 125/50 137/62  Pulse: 83 83 82   Temp: 98 F (36.7 C)  97.9 F (36.6 C)   TempSrc: Oral  Oral   Resp: 18  18   Height:      Weight:      SpO2: 94%  95%    General exam: Pleasant elderly female lying comfortably in bed.  Respiratory system: Clear. No increased work of breathing.  Cardiovascular system: S1 &  S2 heard, RRR. No JVD, murmurs, gallops, clicks or pedal edema. Telemetry: Sinus rhythm.  Gastrointestinal  system: Abdomen is nondistended, soft and nontender. Normal bowel sounds heard.  Central nervous system: Alert and oriented. No focal neurological deficits.  Extremities: Symmetric 5 x 5 power.   Discharge Instructions      Discharge Instructions   Call MD for:  persistant dizziness or light-headedness    Complete by:  As directed      Call MD for:    Complete by:  As directed   Passing out episodes.     Diet - low sodium heart healthy    Complete by:  As directed      Discharge instructions    Complete by:  As directed   1) Do not take 04/01/14 dose of Coumadin which you have already received in the hospital. 2) Wear bilateral thigh-high compression stockings, especially while walking.     Increase activity slowly    Complete by:  As directed             Medication List         albuterol 108 (90 BASE) MCG/ACT inhaler  Commonly known as:  PROVENTIL HFA;VENTOLIN HFA  Inhale 2 puffs into the lungs every 6 (six) hours as needed for wheezing or shortness of breath.     ALPRAZolam 0.25 MG tablet  Commonly known as:  XANAX  Take 0.125 mg by mouth at bedtime as needed for sleep.     amLODipine 2.5 MG tablet  Commonly known as:  NORVASC  Take 1 tablet (2.5 mg total) by mouth daily.     cholecalciferol 1000 UNITS tablet  Commonly known as:  VITAMIN D  Take 1,000 Units by mouth 3 (three) times a week.     letrozole 2.5 MG tablet  Commonly known as:  FEMARA  Take 2.5 mg by mouth daily.     levothyroxine 75 MCG tablet  Commonly known as:  SYNTHROID, LEVOTHROID  Take 75 mg by mouth Daily.     lisinopril 10 MG tablet  Commonly known as:  PRINIVIL,ZESTRIL  Take 1 tablet (10 mg total) by mouth daily.     warfarin 5 MG tablet  Commonly known as:  COUMADIN  - Take 2.5-5 mg by mouth daily at 6 PM. Takes 2.5mg  on Mon, Wed and Fri  - Takes 5mg  all other days     Zoledronic Acid 4 MG/100ML IVPB  Commonly known as:  ZOMETA  Inject 4 mg into the vein every 6 (six) weeks.        Follow-up Information   Follow up with Sanda Klein, MD On 04/03/2014. (Keep prior appointment at 9:45 AM.)    Specialty:  Cardiology   Contact information:   [address] [place] [address] [phone]       Follow up with Tommy Medal, [place] On 04/03/2014. (To be seen for repeat labs (PT & INR) and Coumadin dose adjustment.)    Contact information:   [place] [address] [phone]       Follow up with Mayra Neer, MD. Schedule an appointment as soon as possible for a visit in 1 week.   Specialty:  Family Medicine   Contact information:   [address] [place] [address] [phone]        The results of significant diagnostics from this hospitalization (including imaging, microbiology, ancillary and laboratory) are listed below for reference.    Significant Diagnostic Studies:  Dg Chest Portable 1 View  03/29/2014   CLINICAL DATA:  Syncope.  EXAM: PORTABLE CHEST - 1 VIEW  COMPARISON:  11/12/2013.  FINDINGS: Trachea is midline. Heart size stable. Lungs are hyperinflated. Biapical pleural parenchymal scarring. No airspace consolidation or pleural fluid. No pneumothorax. Surgical clips in the left axilla.  IMPRESSION: Hyperinflation without acute finding.   Electronically Signed   By: Lorin Picket M.D.   On: 03/29/2014 20:04    Microbiology: No results found for this or any previous visit (from the past 240 hour(s)).   Labs: Basic Metabolic Panel:  Recent Labs Lab 03/29/14 1835  NA 141  K 3.6*  CL 105  CO2 24  GLUCOSE 111*  BUN 11  CREATININE 0.86  CALCIUM 9.5   Liver Function Tests: No results found for this basename: AST, ALT, ALKPHOS, BILITOT, PROT, ALBUMIN,  in the last 168 hours No results found for this basename: LIPASE, AMYLASE,  in the last 168 hours No results found for this basename: AMMONIA,  in the last 168 hours CBC:  Recent Labs Lab 03/29/14 1835  03/31/14 0324  WBC 3.6* 4.1  HGB 12.6 12.6  HCT 37.6 37.9  MCV 82.8 85.2  PLT 143* 165   Cardiac Enzymes:  Recent Labs Lab 03/29/14 2351 03/30/14 0410 03/30/14 1014  TROPONINI <0.30 <0.30 <0.30   BNP: BNP (last 3 results) No results found for this basename: PROBNP,  in the last 8760 hours CBG:  Recent Labs Lab 03/29/14 [place] 97    Signed:  Carlitos Bottino, MD, FACP, FHM. [facility] Pager [phone]  If 7PM-7AM, please contact night-coverage [email or web address] Password [email or web address] 04/01/2014, 12:11 PM

## 2014-04-01 NOTE — Discharge Instructions (Signed)
Near-Syncope Near-syncope (commonly known as near fainting) is sudden weakness, dizziness, or feeling like you might pass out. During an episode of near-syncope, you may also develop pale skin, have tunnel vision, or feel sick to your stomach (nauseous). Near-syncope may occur when getting up after sitting or while standing for a long time. It is caused by a sudden decrease in blood flow to the brain. This decrease can result from various causes or triggers, most of which are not serious. However, because near-syncope can sometimes be a sign of something serious, a medical evaluation is required. The specific cause is often not determined. HOME CARE INSTRUCTIONS  Monitor your condition for any changes. The following actions may help to alleviate any discomfort you are experiencing:  Have someone stay with you until you feel stable.  Lie down right away and prop your feet up if you start feeling like you might faint. Breathe deeply and steadily. Wait until all the symptoms have passed. Most of these episodes last only a few minutes. You may feel tired for several hours.   Drink enough fluids to keep your urine clear or pale yellow.   If you are taking blood pressure or heart medicine, get up slowly when seated or lying down. Take several minutes to sit and then stand. This can reduce dizziness.  Follow up with your health care provider as directed. SEEK IMMEDIATE MEDICAL CARE IF:   You have a severe headache.   You have unusual pain in the chest, abdomen, or back.   You are bleeding from the mouth or rectum, or you have black or tarry stool.   You have an irregular or very fast heartbeat.   You have repeated fainting or have seizure-like jerking during an episode.   You faint when sitting or lying down.   You have confusion.   You have difficulty walking.   You have severe weakness.   You have vision problems.  MAKE SURE YOU:   Understand these instructions.  Will  watch your condition.  Will get help right away if you are not doing well or get worse. Document Released: 05/25/2005 Document Revised: 05/30/2013 Document Reviewed: 10/28/2012 [facility] Patient Information 2015 [place], [place]. This information is not intended to replace advice given to you by your health care provider. Make sure you discuss any questions you have with your health care provider.  Orthostatic Hypotension Orthostatic hypotension is a sudden drop in blood pressure. It happens when you quickly stand up from a seated or lying position. You may feel dizzy or light-headed. This can last for just a few seconds or for up to a few minutes. It is usually not a serious problem. However, if this happens frequently or gets worse, it can be a sign of something more serious. CAUSES  Different things can cause orthostatic hypotension, including:   Loss of body fluids (dehydration).  Medicines that lower blood pressure.  Sudden changes in posture, such as standing up quickly after you have been sitting or lying down.  Taking too much of your medicine. SIGNS AND SYMPTOMS   Light-headedness or dizziness.   Fainting or near-fainting.   A fast heart rate.   Weakness.   Feeling tired (fatigue).  DIAGNOSIS  Your health care provider may do several things to help diagnose your condition and identify the cause. These may include:   Taking a medical history and doing a physical exam.  Checking your blood pressure. Your health care provider will check your blood pressure when you  are:  Lying down.  Sitting.  Standing.  Using tilt table testing. In this test, you lie down on a table that moves from a lying position to a standing position. You will be strapped onto the table. This test monitors your blood pressure and heart rate when you are in different positions. TREATMENT  Treatment will vary depending on the cause. Possible treatments include:   Changing the dosage of your  medicines.  Wearing compression stockings on your lower legs.  Standing up slowly after sitting or lying down.  Eating more salt.  Eating frequent, small meals.  In some cases, getting IV fluids.  Taking medicine to enhance fluid retention. HOME CARE INSTRUCTIONS  Only take over-the-counter or prescription medicines as directed by your health care provider.  Follow your health care provider's instructions for changing the dosage of your current medicines.  Do not stop or adjust your medicine on your own.  Stand up slowly after sitting or lying down. This allows your body to adjust to the different position.  Wear compression stockings as directed.  Eat extra salt as directed.  Do not add extra salt to your diet unless directed to by your health care provider.  Eat frequent, small meals.  Avoid standing suddenly after eating.  Avoid hot showers or excessive heat as directed by your health care provider.  Keep all follow-up appointments. SEEK MEDICAL CARE IF:  You continue to feel dizzy or light-headed after standing.  You feel groggy or confused.  You feel cold, clammy, or sick to your stomach (nauseous).  You have blurred vision.  You feel short of breath. SEEK IMMEDIATE MEDICAL CARE IF:   You faint after standing.  You have chest pain.  You have difficulty breathing.   You lose feeling or movement in your arms or legs.   You have slurred speech or difficulty talking, or you are unable to talk.  MAKE SURE YOU:   Understand these instructions.  Will watch your condition.  Will get help right away if you are not doing well or get worse. Document Released: 05/15/2002 Document Revised: 05/30/2013 Document Reviewed: 03/17/2013 [facility] Patient Information 2015 [place], [place]. This information is not intended to replace advice given to you by your health care provider. Make sure you discuss any questions you have with your health care provider.

## 2014-04-01 NOTE — Progress Notes (Signed)
ANTICOAGULATION CONSULT NOTE - Follow Up Consult  Pharmacy Consult for warfarin Indication: atrial fibrillation  Allergies  Allergen Reactions  . Ciprofloxacin Rash    Rash in the mouth  . Penicillins Hives and Rash    Patient Measurements: Height: 5\' 8"  (172.7 cm) Weight: 161 lb 11.2 oz (73.347 kg) IBW/kg (Calculated) : 63.9  Vital Signs: Temp: 97.9 F (36.6 C) (10/25 0534) Temp Source: Oral (10/25 0534) BP: 137/62 mmHg (10/25 0936) Pulse Rate: 82 (10/25 0534)  Labs:  Recent Labs  03/29/14 1835 03/29/14 2351 03/30/14 0410 03/30/14 1014 03/31/14 0324 04/01/14 0530  HGB 12.6  --   --   --  12.6  --   HCT 37.6  --   --   --  37.9  --   PLT 143*  --   --   --  165  --   LABPROT 23.6*  --   --   --  19.5* 19.9*  INR 2.09*  --   --   --  1.63* 1.67*  CREATININE 0.86  --   --   --   --   --   TROPONINI  --  <0.30 <0.30 <0.30  --   --     Estimated Creatinine Clearance: 47.4 ml/min (by C-G formula based on Cr of 0.86).   Medications:  Scheduled:  . amLODipine  2.5 mg Oral Daily  . cholecalciferol  1,000 Units Oral Once per day on Mon Wed Fri  . letrozole  2.5 mg Oral Daily  . levothyroxine  75 mcg Oral QAC breakfast  . lisinopril  10 mg Oral Daily  . sodium chloride  3 mL Intravenous Q12H  . sodium chloride  3 mL Intravenous Q12H  . Warfarin - Pharmacist Dosing Inpatient   Does not apply q1800    Assessment: 80 yo F on warfarin PTA for PAF. Home dose 2.5mg  MWF and 5mg  TThSatSun. INR on admission 2.09. Missed dose 10/22, received 5mg  x 2 and INR still subtherapeutic at 1.67. Hgb/Hct/PLTC WNL. No bleeding noted  Goal of Therapy:  INR 2-3 Monitor platelets by anticoagulation protocol: Yes   Plan:  Warfarin 6mg  x 1 tonight Daily INR Monitor for s/sx of bleeding  Thank you for allowing pharmacy to be part of this patient's care team  [place], Pharm.D Clinical Pharmacy Resident Pager: [phone] 04/01/2014 .11:01 AM

## 2014-04-01 NOTE — Progress Notes (Signed)
Subjective:  No dizziness noted  Objective:  Vital Signs in the last 24 hours: BP 137/62  Pulse 82  Temp(Src) 97.9 F (36.6 C) (Oral)  Resp 18  Ht 5\' 8"  (1.727 m)  Wt 73.347 kg (161 lb 11.2 oz)  BMI 24.59 kg/m2  SpO2 95%  Physical Exam: Pleasant elderly female in no acute distress Lungs:  Clear  Cardiac:  Regular rhythm, normal S1 and S2, no S3 Extremities:  No edema present  Intake/Output from previous day: 10/24 0701 - 10/25 0700 In: 840 [P.O.:840] Out: -  Weight Filed Weights   03/30/14 0612 03/31/14 0634  Weight: 73.755 kg (162 lb 9.6 oz) 73.347 kg (161 lb 11.2 oz)    Lab Results: Basic Metabolic Panel:  Recent Labs  03/29/14 1835  NA 141  K 3.6*  CL 105  CO2 24  GLUCOSE 111*  BUN 11  CREATININE 0.86    CBC:  Recent Labs  03/29/14 1835 03/31/14 0324  WBC 3.6* 4.1  HGB 12.6 12.6  HCT 37.6 37.9  MCV 82.8 85.2  PLT 143* 165   PROTIME: Lab Results  Component Value Date   INR 1.67* 04/01/2014   INR 1.63* 03/31/2014   INR 2.09* 03/29/2014    Telemetry: Currently sinus rhythm. No atrial fibrillation. No pauses noted  Assessment/Plan:  1. Dizziness and presyncope-possibly orthostasis 2. Hypertension 3. Paroxysmal atrial fibrillation 4. Prior long-term use of anticoagulation  Recommendations:  Followup with Dr. Loletha Grayer on Tuesday for event monitor.    Kerry Hough  MD [facility] Card[facility]  04/01/2014, 11:12 AM

## 2014-04-01 NOTE — Progress Notes (Signed)
Utilization Review Completed.   Zacory Fiola, RN, BSN Nurse Case Manager  

## 2014-04-03 ENCOUNTER — Ambulatory Visit (INDEPENDENT_AMBULATORY_CARE_PROVIDER_SITE_OTHER): Payer: Medicare Other | Admitting: Cardiovascular Disease

## 2014-04-03 ENCOUNTER — Ambulatory Visit (INDEPENDENT_AMBULATORY_CARE_PROVIDER_SITE_OTHER): Payer: Medicare Other | Admitting: *Deleted

## 2014-04-03 ENCOUNTER — Encounter: Payer: Self-pay | Admitting: Cardiovascular Disease

## 2014-04-03 VITALS — BP 120/65 | HR 73 | Ht 67.0 in | Wt 166.0 lb

## 2014-04-03 DIAGNOSIS — Z7901 Long term (current) use of anticoagulants: Secondary | ICD-10-CM

## 2014-04-03 DIAGNOSIS — I480 Paroxysmal atrial fibrillation: Secondary | ICD-10-CM

## 2014-04-03 DIAGNOSIS — I4891 Unspecified atrial fibrillation: Secondary | ICD-10-CM

## 2014-04-03 DIAGNOSIS — R55 Syncope and collapse: Secondary | ICD-10-CM

## 2014-04-03 DIAGNOSIS — I495 Sick sinus syndrome: Secondary | ICD-10-CM

## 2014-04-03 LAB — POCT INR: INR: 2

## 2014-04-03 NOTE — Patient Instructions (Signed)
Your physician has recommended that you wear a 30 day event monitor. Event monitors are medical devices that record the heart's electrical activity. Doctors most often [place] these monitors to diagnose arrhythmias. Arrhythmias are problems with the speed or rhythm of the heartbeat. The monitor is a small, portable device. You can wear one while you do your normal daily activities. This is usually used to diagnose what is causing palpitations/syncope (passing out).   Dr. Sallyanne Kuster recommends that you schedule a follow-up appointment in: 6 weeks
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
## 2014-04-08 NOTE — Progress Notes (Signed)
Thank you so much - will help me convince her that this is worthwhile - she is very reluctant [facility]

## 2014-04-09 ENCOUNTER — Ambulatory Visit: Payer: Medicare Other | Admitting: Pharmacist Clinician (PhC)/ Clinical Pharmacy Specialist

## 2014-04-10 ENCOUNTER — Ambulatory Visit: Payer: Medicare Other | Admitting: Cardiology

## 2014-04-10 ENCOUNTER — Telehealth: Payer: Self-pay | Admitting: Cardiovascular Disease

## 2014-04-10 ENCOUNTER — Telehealth: Payer: Self-pay | Admitting: *Deleted

## 2014-04-10 DIAGNOSIS — R55 Syncope and collapse: Secondary | ICD-10-CM

## 2014-04-10 NOTE — Telephone Encounter (Signed)
Constance with [facility] [employer] needs to know if this patient has received her monitor.  She is trying to start an authorization with [facility] for this .

## 2014-04-10 NOTE — Telephone Encounter (Signed)
Patient's [employer] approved # [id number] from 10/27 to 11/26  Roland Rack states that we should find and utilize an approved vendor that takes [employer] for our monitoring services. Supervisor informed.

## 2014-04-10 NOTE — Telephone Encounter (Signed)
Spoke with Banks at [facility]. They just received the request for coverage for MCOT yesterday however monitor was started on 10/27. She states while the vendor [employer] school teacher) is not covered under [employer], they were going to go ahead and cover the monitor, as another vendor is not the in patient's area. Roland Rack will call back as necessary   No order for monitor was placed in epic - ordered on 04/10/14 so that monitor results can be scanned into medical record

## 2014-04-20 ENCOUNTER — Other Ambulatory Visit: Payer: Self-pay | Admitting: Cardiovascular Disease

## 2014-04-20 ENCOUNTER — Telehealth: Payer: Self-pay | Admitting: *Deleted

## 2014-04-20 NOTE — Telephone Encounter (Signed)
Received a refill request from pharmacy to refill Metoprolol.  Not on med list. Left message for patient to call to confirm she is not taking.  Called me back and she is not taking (D/C'd) 03/27/14. Refill sent in error by [place], Therapist, sports.  Pharmacy notified not to refill and D/C.  Voiced understanding.

## 2014-04-20 NOTE — Telephone Encounter (Signed)
E sent to pharmacy 

## 2014-04-25 ENCOUNTER — Encounter: Payer: Self-pay | Admitting: Cardiovascular Disease

## 2014-04-27 ENCOUNTER — Telehealth: Payer: Self-pay | Admitting: Cardiovascular Disease

## 2014-04-27 NOTE — Telephone Encounter (Signed)
Med refilled, amlodipine 2.5 mg #30 [id number]

## 2014-04-27 NOTE — Telephone Encounter (Signed)
Amy White is calling because [facility] is stating that we are refusing to refill her mediation . Her Amlodipine. Please call    Thanks

## 2014-04-30 ENCOUNTER — Ambulatory Visit (INDEPENDENT_AMBULATORY_CARE_PROVIDER_SITE_OTHER): Payer: Medicare Other | Admitting: Pharmacist Clinician (PhC)/ Clinical Pharmacy Specialist

## 2014-04-30 DIAGNOSIS — Z7901 Long term (current) use of anticoagulants: Secondary | ICD-10-CM

## 2014-04-30 DIAGNOSIS — I4891 Unspecified atrial fibrillation: Secondary | ICD-10-CM

## 2014-04-30 LAB — POCT INR: INR: 1.6

## 2014-05-07 ENCOUNTER — Encounter (HOSPITAL_COMMUNITY)
Admission: RE | Admit: 2014-05-07 | Discharge: 2014-05-07 | Disposition: A | Discharge status: Home / Self Care | Payer: Medicare Other | Source: Ambulatory Visit | Attending: Hematology & Oncology | Admitting: Hematology & Oncology

## 2014-05-07 ENCOUNTER — Other Ambulatory Visit: Payer: Self-pay

## 2014-05-07 ENCOUNTER — Encounter (HOSPITAL_COMMUNITY): Payer: Medicare Other

## 2014-05-07 ENCOUNTER — Encounter: Payer: Self-pay | Admitting: Cardiovascular Disease

## 2014-05-07 ENCOUNTER — Telehealth: Payer: Self-pay | Admitting: *Deleted

## 2014-05-07 DIAGNOSIS — C50919 Malignant neoplasm of unspecified site of unspecified female breast: Secondary | ICD-10-CM | POA: Diagnosis present

## 2014-05-07 DIAGNOSIS — C7951 Secondary malignant neoplasm of bone: Secondary | ICD-10-CM | POA: Insufficient documentation

## 2014-05-07 MED ORDER — TECHNETIUM TC 99M MEDRONATE IV KIT
24.0000 | PACK | Freq: Once | INTRAVENOUS | Status: AC | PRN
  Administered 2014-05-07: 24 via INTRAVENOUS

## 2014-05-07 MED ORDER — LISINOPRIL 10 MG PO TABS: 10.0000 mg | ORAL_TABLET | Freq: Every day | ORAL | Status: DC

## 2014-05-07 NOTE — Telephone Encounter (Addendum)
Patient happy with results.   ----- Message from Volanda Napoleon, MD sent at 05/07/2014  2:06 PM EST ----- Call - NO change in bone scan!!!!  No NEW areas!!! This is fantastic!!! Merry Christmas!!  Laurey Arrow

## 2014-05-07 NOTE — Telephone Encounter (Signed)
Rx sent to pharmacy   

## 2014-05-10 ENCOUNTER — Other Ambulatory Visit (HOSPITAL_BASED_OUTPATIENT_CLINIC_OR_DEPARTMENT_OTHER): Payer: Medicare Other | Admitting: Lab

## 2014-05-10 ENCOUNTER — Encounter: Payer: Self-pay | Admitting: Hematology & Oncology

## 2014-05-10 ENCOUNTER — Ambulatory Visit (HOSPITAL_BASED_OUTPATIENT_CLINIC_OR_DEPARTMENT_OTHER): Payer: Medicare Other

## 2014-05-10 ENCOUNTER — Telehealth: Payer: Self-pay | Admitting: Hematology & Oncology

## 2014-05-10 ENCOUNTER — Ambulatory Visit (HOSPITAL_BASED_OUTPATIENT_CLINIC_OR_DEPARTMENT_OTHER): Payer: Medicare Other | Admitting: Hematology & Oncology

## 2014-05-10 VITALS — BP 113/53 | HR 66 | Temp 97.9°F | Resp 16 | Ht 67.0 in | Wt 168.0 lb

## 2014-05-10 DIAGNOSIS — C50919 Malignant neoplasm of unspecified site of unspecified female breast: Secondary | ICD-10-CM

## 2014-05-10 DIAGNOSIS — I4891 Unspecified atrial fibrillation: Secondary | ICD-10-CM

## 2014-05-10 DIAGNOSIS — C7951 Secondary malignant neoplasm of bone: Secondary | ICD-10-CM

## 2014-05-10 LAB — CBC WITH DIFFERENTIAL (CANCER CENTER ONLY)
BASO#: 0 10*3/uL (ref 0.0–0.2)
BASO%: 0.6 % (ref 0.0–2.0)
EOS%: 5.3 % (ref 0.0–7.0)
Eosinophils Absolute: 0.2 10*3/uL (ref 0.0–0.5)
HCT: 35.4 % (ref 34.8–46.6)
HGB: 11.8 g/dL (ref 11.6–15.9)
LYMPH#: 1.2 10*3/uL (ref 0.9–3.3)
LYMPH%: 34.8 % (ref 14.0–48.0)
MCH: 29.3 pg (ref 26.0–34.0)
MCHC: 33.3 g/dL (ref 32.0–36.0)
MCV: 88 fL (ref 81–101)
MONO#: 0.3 10*3/uL (ref 0.1–0.9)
MONO%: 9.9 % (ref 0.0–13.0)
NEUT#: 1.7 10*3/uL (ref 1.5–6.5)
NEUT%: 49.4 % (ref 39.6–80.0)
Platelets: 177 10*3/uL (ref 145–400)
RBC: 4.03 10*6/uL (ref 3.70–5.32)
RDW: 14.3 % (ref 11.1–15.7)
WBC: 3.4 10*3/uL — ABNORMAL LOW (ref 3.9–10.0)

## 2014-05-10 LAB — CMP (CANCER CENTER ONLY)
ALT(SGPT): 18 U/L (ref 10–47)
AST: 22 U/L (ref 11–38)
Albumin: 3.5 g/dL (ref 3.3–5.5)
Alkaline Phosphatase: 46 U/L (ref 26–84)
BUN, Bld: 16 mg/dL (ref 7–22)
CO2: 28 mEq/L (ref 18–33)
Calcium: 9.3 mg/dL (ref 8.0–10.3)
Chloride: 109 mEq/L — ABNORMAL HIGH (ref 98–108)
Creat: 1 mg/dl (ref 0.6–1.2)
Glucose, Bld: 82 mg/dL (ref 73–118)
Potassium: 4.4 mEq/L (ref 3.3–4.7)
Sodium: 144 mEq/L (ref 128–145)
Total Bilirubin: 1.2 mg/dl (ref 0.20–1.60)
Total Protein: 6.8 g/dL (ref 6.4–8.1)

## 2014-05-10 LAB — CANCER ANTIGEN 27.29: CA 27.29: 46 U/mL — ABNORMAL HIGH (ref 0–39)

## 2014-05-10 MED ORDER — ZOLEDRONIC ACID 4 MG/100ML IV SOLN
4.0000 mg | Freq: Once | INTRAVENOUS | Status: AC
  Administered 2014-05-10: 4 mg via INTRAVENOUS
  Filled 2014-05-10: qty 100

## 2014-05-10 NOTE — Patient Instructions (Signed)

## 2014-05-10 NOTE — Telephone Encounter (Signed)
Mailed march schedule

## 2014-05-10 NOTE — Progress Notes (Signed)
Hematology and Oncology Follow Up Visit  Amy White [id number] 1925-10-16 80 y.o. 05/10/2014   Principle Diagnosis:  Metastatic breast cancer, bone metastasis only.  Current Therapy:   1. Femara 2.5 mg p.o. daily. 2. Zometa 4 mg IV q.3 months.     Interim History:  Ms.  White is for followup. We last saw her back in June.  We did do a bone scan last week. . Everything was very stable with respect to her bone metastasis.  Her heart has been doing okay. She is on Coumadin. She's not had any issues with her heart and atrial fibrillation. She has a sebaceous cyst on her back. This probably needs to be removed. She will see her family doctor regarding this.  She's had no problems with bowels or bladder. She's had no problems with nausea or vomiting. She's had no leg swelling. She's been doing some yard work. She enjoys this.   Her last CA 27-29 in September was 48. This is up a little bit.  Overall, her performance status is [name] Medications: Current outpatient prescriptions: albuterol (PROVENTIL HFA;VENTOLIN HFA) 108 (90 BASE) MCG/ACT inhaler, Inhale 2 puffs into the lungs every 6 (six) hours as needed for wheezing or shortness of breath., Disp: , Rfl: ;  ALPRAZolam (XANAX) 0.25 MG tablet, Take 0.125 mg by mouth at bedtime as needed for sleep. , Disp: , Rfl: ;  amLODipine (NORVASC) 2.5 MG tablet, Take 1 tablet (2.5 mg total) by mouth daily., Disp: 30 tablet, Rfl: 0 cholecalciferol (VITAMIN D) 1000 UNITS tablet, Take 1,000 Units by mouth 3 (three) times a week., Disp: , Rfl: ;  letrozole (FEMARA) 2.5 MG tablet, Take 2.5 mg by mouth daily., Disp: , Rfl: ;  levothyroxine (SYNTHROID, LEVOTHROID) 75 MCG tablet, Take 75 mg by mouth Daily., Disp: , Rfl: ;  lisinopril (PRINIVIL,ZESTRIL) 10 MG tablet, Take 1 tablet (10 mg total) by mouth daily., Disp: 30 tablet, Rfl: 0 warfarin (COUMADIN) 5 MG tablet, Take 2.5-5 mg by mouth daily at 6 PM. Takes 2.5mg  on Mon, Wed and Fri Takes 5mg  all other days,  Disp: , Rfl: ;  Zoledronic Acid (ZOMETA) 4 MG/100ML IVPB, Inject 4 mg into the vein every 6 (six) weeks., Disp: , Rfl:   Allergies:  Allergies  Allergen Reactions  . Ciprofloxacin Rash    Rash in the mouth  . Penicillins Hives and Rash    Past Medical History, Surgical history, Social history, and Family History were reviewed and updated.  Review of Systems: As above  Physical Exam:  height is 5\' 7"  (1.702 m) and weight is 168 lb (76.204 kg). Her oral temperature is 97.9 F (36.6 C). Her blood pressure is 113/53 and her pulse is 66. Her respiration is 16.   Elderly, thin but well-nourished white female. Lungs are clear. Cardiac exam regular rate and rhythm with a normal S1 and S2. There are no murmurs, rubs or bruits.. Abdomen is soft. There is no palpable liver or spleen tip. Back exam shows some slight kyphosis. No tenderness noted over the spine. Extremities shows osteoarthritic changes in her joints. She has some age related muscle atrophy. Neurological exam is nonfocal. Lymph node exam shows no adenopathy.   Lab Results  Component Value Date   WBC 3.4* 05/10/2014   HGB 11.8 05/10/2014   HCT 35.4 05/10/2014   MCV 88 05/10/2014   PLT 177 05/10/2014     Chemistry      Component Value Date/Time   NA 144 05/10/2014 1141  NA 141 03/29/2014 1835   K 4.4 05/10/2014 1141   K 3.6* 03/29/2014 1835   CL 109* 05/10/2014 1141   CL 105 03/29/2014 1835   CO2 28 05/10/2014 1141   CO2 24 03/29/2014 1835   BUN 16 05/10/2014 1141   BUN 11 03/29/2014 1835   CREATININE 1.0 05/10/2014 1141   CREATININE 0.86 03/29/2014 1835      Component Value Date/Time   CALCIUM 9.3 05/10/2014 1141   CALCIUM 9.5 03/29/2014 1835   ALKPHOS 46 05/10/2014 1141   ALKPHOS 48 02/08/2014 1110   AST 22 05/10/2014 1141   AST 16 02/08/2014 1110   ALT 18 05/10/2014 1141   ALT 9 02/08/2014 1110   BILITOT 1.20 05/10/2014 1141   BILITOT 1.1 02/08/2014 1110         Impression and Plan: Amy White is a  80 year old white female with metastatic breast cancer. Her disease is confined to her bones. She has been stable. She has been stable now for over 5 years. She's done incredibly well. We will continue her on the Femara. I do not see any indication for anything different for right now.  She is on Coumadin. She's on Coumadin for her heart.  We will continue the Zometa.  Her bone scan is reassuring.  I will plan to get her back to see [place] in another 3 months.   Volanda Napoleon, MD 12/3/20151:51 PM

## 2014-05-12 ENCOUNTER — Other Ambulatory Visit: Payer: Self-pay | Admitting: Pharmacist Clinician (PhC)/ Clinical Pharmacy Specialist

## 2014-05-12 ENCOUNTER — Other Ambulatory Visit: Payer: Self-pay | Admitting: Cardiovascular Disease

## 2014-05-14 ENCOUNTER — Ambulatory Visit (INDEPENDENT_AMBULATORY_CARE_PROVIDER_SITE_OTHER): Payer: Medicare Other | Admitting: Pharmacist Clinician (PhC)/ Clinical Pharmacy Specialist

## 2014-05-14 ENCOUNTER — Ambulatory Visit: Payer: Medicare Other | Admitting: Cardiovascular Disease

## 2014-05-14 DIAGNOSIS — Z7901 Long term (current) use of anticoagulants: Secondary | ICD-10-CM

## 2014-05-14 DIAGNOSIS — I4891 Unspecified atrial fibrillation: Secondary | ICD-10-CM

## 2014-05-14 LAB — POCT INR: INR: 2.2

## 2014-05-14 NOTE — Telephone Encounter (Signed)
Metoprolol succinate refill refused. Documented in tele encounter by B. Lassister, CMA that it was confirmed that patient does not take this.

## 2014-05-30 ENCOUNTER — Telehealth: Payer: Self-pay | Admitting: Cardiovascular Disease

## 2014-05-30 NOTE — Telephone Encounter (Signed)
Spoke with pt, she reports after taking her amlodipine in the morning she will get a fast pounding heart beat. It will last about one hour and then settle down. It happens every morning after taking the medicine. Her bp has been running 115/60 and pulse in the 80's. She is going to stop the amlodipine and track her bp and let us know how it is running. Will make dr croitoru aware

## 2014-05-30 NOTE — Telephone Encounter (Signed)
Pt says since she started taking Amlodipine,been having palpatations. Please call to advise.

## 2014-06-04 NOTE — Telephone Encounter (Signed)
Agree 

## 2014-06-05 ENCOUNTER — Other Ambulatory Visit: Payer: Self-pay | Admitting: Cardiovascular Disease

## 2014-06-05 NOTE — Telephone Encounter (Signed)
Rx(s) sent to pharmacy electronically.  

## 2014-06-07 ENCOUNTER — Other Ambulatory Visit: Payer: Self-pay | Admitting: Hematology & Oncology

## 2014-06-11 ENCOUNTER — Ambulatory Visit (INDEPENDENT_AMBULATORY_CARE_PROVIDER_SITE_OTHER): Payer: Medicare Other | Admitting: Pharmacist Clinician (PhC)/ Clinical Pharmacy Specialist

## 2014-06-11 DIAGNOSIS — I4891 Unspecified atrial fibrillation: Secondary | ICD-10-CM

## 2014-06-11 DIAGNOSIS — Z7901 Long term (current) use of anticoagulants: Secondary | ICD-10-CM

## 2014-06-11 LAB — POCT INR: INR: 2.1

## 2014-06-25 ENCOUNTER — Telehealth: Payer: Self-pay | Admitting: Cardiovascular Disease

## 2014-06-25 NOTE — Telephone Encounter (Signed)
Pt called in stating that last night she had a bad episode of having some  palpitations ans she would like to if there is a medication that she can take to regulate her heart rate. She stated that she was once on Toprol but it raised her heart rate. Please call  Thanks

## 2014-06-25 NOTE — Telephone Encounter (Signed)
Problem with Toprol (or anything else we could prescribe for palpitations) is  Her resting bradycardia, which would be worsened by meds. Offered pacemaker in the past, but she has declined. Please ask her if she still feels the same about permanent pacemaker implantation.

## 2014-06-25 NOTE — Telephone Encounter (Signed)
Pt states she continues to have problems with palpitations and had to take two Xanax last night to be able to calm down and relax , after about an hour the palpitations subsided and she's been ok, pt has an appt with dr. Sallyanne Kuster on the 3rd of Feb. , I instructed pt to go to ER if she develops any CP or sob . Pt. Agreed with plan.

## 2014-06-26 NOTE — Telephone Encounter (Signed)
Pt. Called and informed of Dr. Victorino December instructions and I asked her about if she still did or did not want the pacemaker that was offered, I am waiting for her decision

## 2014-07-09 ENCOUNTER — Ambulatory Visit: Payer: Medicare Other | Admitting: Pharmacist Clinician (PhC)/ Clinical Pharmacy Specialist

## 2014-07-11 ENCOUNTER — Encounter: Payer: Self-pay | Admitting: Cardiovascular Disease

## 2014-07-11 ENCOUNTER — Ambulatory Visit (INDEPENDENT_AMBULATORY_CARE_PROVIDER_SITE_OTHER): Payer: Medicare Other | Admitting: Pharmacist Clinician (PhC)/ Clinical Pharmacy Specialist

## 2014-07-11 ENCOUNTER — Ambulatory Visit (INDEPENDENT_AMBULATORY_CARE_PROVIDER_SITE_OTHER): Payer: Medicare Other | Admitting: Cardiovascular Disease

## 2014-07-11 VITALS — BP 118/70 | HR 90 | Resp 16 | Ht 68.0 in | Wt 166.8 lb

## 2014-07-11 DIAGNOSIS — Z7901 Long term (current) use of anticoagulants: Secondary | ICD-10-CM

## 2014-07-11 DIAGNOSIS — R55 Syncope and collapse: Secondary | ICD-10-CM

## 2014-07-11 DIAGNOSIS — I4891 Unspecified atrial fibrillation: Secondary | ICD-10-CM

## 2014-07-11 DIAGNOSIS — I495 Sick sinus syndrome: Secondary | ICD-10-CM

## 2014-07-11 DIAGNOSIS — Z79899 Other long term (current) drug therapy: Secondary | ICD-10-CM

## 2014-07-11 LAB — POCT INR: INR: 3

## 2014-07-11 NOTE — Patient Instructions (Signed)
Your physician has recommended that you have a pacemaker inserted RIGHT SIDED [employer] MRI DUAL CHAMBER. A pacemaker is a small device that is placed under the skin of your chest or abdomen to help control abnormal heart rhythms. This device uses electrical pulses to prompt the heart to beat at a normal rate. Pacemakers are used to treat heart rhythms that are too slow. Wire (leads) are attached to the pacemaker that goes into the chambers of you heart. This is done in the hospital and usually requires and overnight stay. Please see the instruction sheet given to you today for more information.  You will HOLD the Coumadin 5 days prior to the pacemaker insertion.
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
## 2014-07-16 ENCOUNTER — Other Ambulatory Visit: Payer: Self-pay | Admitting: *Deleted

## 2014-07-16 DIAGNOSIS — I495 Sick sinus syndrome: Secondary | ICD-10-CM

## 2014-07-25 ENCOUNTER — Telehealth: Payer: Self-pay | Admitting: Cardiovascular Disease

## 2014-07-25 NOTE — Telephone Encounter (Signed)
Spoke to patient and advised that her procedure had been changed to 1:00 on February 23.  She should report to hospital 11:30.

## 2014-07-25 NOTE — Telephone Encounter (Signed)
Returning your call,concerning her Cath.

## 2014-07-26 LAB — CBC
HCT: 35.9 % — ABNORMAL LOW (ref 36.0–46.0)
Hemoglobin: 12 g/dL (ref 12.0–15.0)
MCH: 28.7 pg (ref 26.0–34.0)
MCHC: 33.4 g/dL (ref 30.0–36.0)
MCV: 85.9 fL (ref 78.0–100.0)
MPV: 9.1 fL (ref 8.6–12.4)
Platelets: 202 10*3/uL (ref 150–400)
RBC: 4.18 MIL/uL (ref 3.87–5.11)
RDW: 14.6 % (ref 11.5–15.5)
WBC: 4.4 10*3/uL (ref 4.0–10.5)

## 2014-07-26 LAB — COMPREHENSIVE METABOLIC PANEL
ALT: 12 U/L (ref 0–35)
AST: 19 U/L (ref 0–37)
Albumin: 4.2 g/dL (ref 3.5–5.2)
Alkaline Phosphatase: 48 U/L (ref 39–117)
BUN: 21 mg/dL (ref 6–23)
CO2: 26 mEq/L (ref 19–32)
Calcium: 9.5 mg/dL (ref 8.4–10.5)
Chloride: 109 mEq/L (ref 96–112)
Creat: 1.27 mg/dL — ABNORMAL HIGH (ref 0.50–1.10)
Glucose, Bld: 100 mg/dL — ABNORMAL HIGH (ref 70–99)
Potassium: 4.7 mEq/L (ref 3.5–5.3)
Sodium: 143 mEq/L (ref 135–145)
Total Bilirubin: 1.5 mg/dL — ABNORMAL HIGH (ref 0.2–1.2)
Total Protein: 6.3 g/dL (ref 6.0–8.3)

## 2014-07-26 LAB — PROTIME-INR
INR: 2.22 — ABNORMAL HIGH (ref <1.50)
Prothrombin Time: 24.6 seconds — ABNORMAL HIGH (ref 11.6–15.2)

## 2014-07-26 LAB — APTT: aPTT: 34 seconds (ref 24–37)

## 2014-07-27 ENCOUNTER — Inpatient Hospital Stay (HOSPITAL_COMMUNITY)
Admission: EM | Admit: 2014-07-27 | Discharge: 2014-07-29 | DRG: 379 | Disposition: A | Discharge status: Home / Self Care | Payer: Medicare Other | Attending: Internal Medicine | Admitting: Internal Medicine

## 2014-07-27 ENCOUNTER — Encounter (HOSPITAL_COMMUNITY): Payer: Self-pay | Admitting: *Deleted

## 2014-07-27 DIAGNOSIS — E0590 Thyrotoxicosis, unspecified without thyrotoxic crisis or storm: Secondary | ICD-10-CM | POA: Diagnosis present

## 2014-07-27 DIAGNOSIS — K921 Melena: Principal | ICD-10-CM | POA: Diagnosis present

## 2014-07-27 DIAGNOSIS — I480 Paroxysmal atrial fibrillation: Secondary | ICD-10-CM | POA: Diagnosis present

## 2014-07-27 DIAGNOSIS — F419 Anxiety disorder, unspecified: Secondary | ICD-10-CM | POA: Diagnosis present

## 2014-07-27 DIAGNOSIS — I10 Essential (primary) hypertension: Secondary | ICD-10-CM | POA: Diagnosis present

## 2014-07-27 DIAGNOSIS — K922 Gastrointestinal hemorrhage, unspecified: Secondary | ICD-10-CM | POA: Diagnosis present

## 2014-07-27 DIAGNOSIS — J45909 Unspecified asthma, uncomplicated: Secondary | ICD-10-CM | POA: Diagnosis present

## 2014-07-27 DIAGNOSIS — Z86010 Personal history of colonic polyps: Secondary | ICD-10-CM

## 2014-07-27 DIAGNOSIS — I482 Chronic atrial fibrillation, unspecified: Secondary | ICD-10-CM | POA: Diagnosis present

## 2014-07-27 DIAGNOSIS — Z880 Allergy status to penicillin: Secondary | ICD-10-CM | POA: Diagnosis not present

## 2014-07-27 DIAGNOSIS — F329 Major depressive disorder, single episode, unspecified: Secondary | ICD-10-CM | POA: Diagnosis present

## 2014-07-27 DIAGNOSIS — M1990 Unspecified osteoarthritis, unspecified site: Secondary | ICD-10-CM | POA: Diagnosis present

## 2014-07-27 DIAGNOSIS — K625 Hemorrhage of anus and rectum: Secondary | ICD-10-CM | POA: Diagnosis present

## 2014-07-27 DIAGNOSIS — I4891 Unspecified atrial fibrillation: Secondary | ICD-10-CM | POA: Diagnosis present

## 2014-07-27 DIAGNOSIS — K219 Gastro-esophageal reflux disease without esophagitis: Secondary | ICD-10-CM | POA: Diagnosis present

## 2014-07-27 DIAGNOSIS — C50919 Malignant neoplasm of unspecified site of unspecified female breast: Secondary | ICD-10-CM | POA: Diagnosis present

## 2014-07-27 DIAGNOSIS — Z7901 Long term (current) use of anticoagulants: Secondary | ICD-10-CM | POA: Diagnosis not present

## 2014-07-27 DIAGNOSIS — J449 Chronic obstructive pulmonary disease, unspecified: Secondary | ICD-10-CM | POA: Diagnosis present

## 2014-07-27 DIAGNOSIS — I495 Sick sinus syndrome: Secondary | ICD-10-CM | POA: Diagnosis present

## 2014-07-27 DIAGNOSIS — Z853 Personal history of malignant neoplasm of breast: Secondary | ICD-10-CM | POA: Diagnosis not present

## 2014-07-27 DIAGNOSIS — C7951 Secondary malignant neoplasm of bone: Secondary | ICD-10-CM

## 2014-07-27 LAB — COMPREHENSIVE METABOLIC PANEL
ALT: 13 U/L (ref 0–35)
AST: 23 U/L (ref 0–37)
Albumin: 3.8 g/dL (ref 3.5–5.2)
Alkaline Phosphatase: 44 U/L (ref 39–117)
Anion gap: 7 (ref 5–15)
BUN: 24 mg/dL — ABNORMAL HIGH (ref 6–23)
CO2: 22 mmol/L (ref 19–32)
Calcium: 9.1 mg/dL (ref 8.4–10.5)
Chloride: 112 mmol/L (ref 96–112)
Creatinine, Ser: 1.04 mg/dL (ref 0.50–1.10)
GFR calc Af Amer: 55 mL/min — ABNORMAL LOW (ref >90)
GFR calc non Af Amer: 47 mL/min — ABNORMAL LOW (ref >90)
Glucose, Bld: 98 mg/dL (ref 70–99)
Potassium: 4.1 mmol/L (ref 3.5–5.1)
Sodium: 141 mmol/L (ref 135–145)
Total Bilirubin: 1.7 mg/dL — ABNORMAL HIGH (ref 0.3–1.2)
Total Protein: 6.2 g/dL (ref 6.0–8.3)

## 2014-07-27 LAB — CBC WITH DIFFERENTIAL/PLATELET
Basophils Absolute: 0 10*3/uL (ref 0.0–0.1)
Basophils Relative: 1 % (ref 0–1)
Eosinophils Absolute: 0.1 10*3/uL (ref 0.0–0.7)
Eosinophils Relative: 2 % (ref 0–5)
HCT: 35 % — ABNORMAL LOW (ref 36.0–46.0)
Hemoglobin: 11.7 g/dL — ABNORMAL LOW (ref 12.0–15.0)
Lymphocytes Relative: 24 % (ref 12–46)
Lymphs Abs: 0.8 10*3/uL (ref 0.7–4.0)
MCH: 29 pg (ref 26.0–34.0)
MCHC: 33.4 g/dL (ref 30.0–36.0)
MCV: 86.8 fL (ref 78.0–100.0)
Monocytes Absolute: 0.4 10*3/uL (ref 0.1–1.0)
Monocytes Relative: 11 % (ref 3–12)
Neutro Abs: 2.2 10*3/uL (ref 1.7–7.7)
Neutrophils Relative %: 64 % (ref 43–77)
Platelets: 155 10*3/uL (ref 150–400)
RBC: 4.03 MIL/uL (ref 3.87–5.11)
RDW: 13.7 % (ref 11.5–15.5)
WBC: 3.4 10*3/uL — ABNORMAL LOW (ref 4.0–10.5)

## 2014-07-27 LAB — I-STAT CHEM 8, ED
BUN: 25 mg/dL — ABNORMAL HIGH (ref 6–23)
Calcium, Ion: 1.15 mmol/L (ref 1.13–1.30)
Chloride: 110 mmol/L (ref 96–112)
Creatinine, Ser: 0.9 mg/dL (ref 0.50–1.10)
Glucose, Bld: 97 mg/dL (ref 70–99)
HCT: 36 % (ref 36.0–46.0)
Hemoglobin: 12.2 g/dL (ref 12.0–15.0)
Potassium: 4.1 mmol/L (ref 3.5–5.1)
Sodium: 143 mmol/L (ref 135–145)
TCO2: 18 mmol/L (ref 0–100)

## 2014-07-27 LAB — TSH: TSH: 0.586 u[IU]/mL (ref 0.350–4.500)

## 2014-07-27 LAB — POC OCCULT BLOOD, ED: Fecal Occult Bld: POSITIVE — AB

## 2014-07-27 LAB — TYPE AND SCREEN
ABO/RH(D): O NEG
Antibody Screen: NEGATIVE

## 2014-07-27 LAB — PROTIME-INR
INR: 2.12 — ABNORMAL HIGH (ref 0.00–1.49)
Prothrombin Time: 23.9 seconds — ABNORMAL HIGH (ref 11.6–15.2)

## 2014-07-27 MED ORDER — LETROZOLE 2.5 MG PO TABS
2.5000 mg | ORAL_TABLET | Freq: Every day | ORAL | Status: DC
  Administered 2014-07-28 – 2014-07-29 (×2): 2.5 mg via ORAL
  Filled 2014-07-27 (×2): qty 1

## 2014-07-27 MED ORDER — ALUM & MAG HYDROXIDE-SIMETH 200-200-20 MG/5ML PO SUSP: 30.0000 mL | Freq: Four times a day (QID) | ORAL | Status: DC | PRN

## 2014-07-27 MED ORDER — ACETAMINOPHEN 325 MG PO TABS: 650.0000 mg | ORAL_TABLET | Freq: Four times a day (QID) | ORAL | Status: DC | PRN

## 2014-07-27 MED ORDER — SODIUM CHLORIDE 0.9 % IV SOLN
INTRAVENOUS | Status: AC
  Administered 2014-07-27: 18:00:00 via INTRAVENOUS

## 2014-07-27 MED ORDER — ALPRAZOLAM 0.5 MG PO TABS
0.5000 mg | ORAL_TABLET | Freq: Two times a day (BID) | ORAL | Status: DC | PRN
  Filled 2014-07-27: qty 1

## 2014-07-27 MED ORDER — ALBUTEROL SULFATE HFA 108 (90 BASE) MCG/ACT IN AERS: 2.0000 | INHALATION_SPRAY | Freq: Four times a day (QID) | RESPIRATORY_TRACT | Status: DC | PRN

## 2014-07-27 MED ORDER — ONDANSETRON HCL 4 MG/2ML IJ SOLN: 4.0000 mg | Freq: Four times a day (QID) | INTRAMUSCULAR | Status: DC | PRN

## 2014-07-27 MED ORDER — ALBUTEROL SULFATE (2.5 MG/3ML) 0.083% IN NEBU: 2.5000 mg | INHALATION_SOLUTION | Freq: Four times a day (QID) | RESPIRATORY_TRACT | Status: DC | PRN

## 2014-07-27 MED ORDER — LEVOTHYROXINE SODIUM 75 MCG PO TABS
75.0000 ug | ORAL_TABLET | Freq: Every day | ORAL | Status: DC
  Administered 2014-07-28 – 2014-07-29 (×2): 75 ug via ORAL
  Filled 2014-07-27 (×3): qty 1

## 2014-07-27 MED ORDER — AMLODIPINE BESYLATE 2.5 MG PO TABS
2.5000 mg | ORAL_TABLET | Freq: Every day | ORAL | Status: DC
  Administered 2014-07-28: 2.5 mg via ORAL
  Filled 2014-07-27 (×2): qty 1

## 2014-07-27 MED ORDER — SODIUM CHLORIDE 0.9 % IJ SOLN
3.0000 mL | Freq: Two times a day (BID) | INTRAMUSCULAR | Status: DC
  Administered 2014-07-28 (×2): 3 mL via INTRAVENOUS

## 2014-07-27 MED ORDER — ONDANSETRON HCL 4 MG PO TABS: 4.0000 mg | ORAL_TABLET | Freq: Four times a day (QID) | ORAL | Status: DC | PRN

## 2014-07-27 MED ORDER — OXYCODONE HCL 5 MG PO TABS: 5.0000 mg | ORAL_TABLET | ORAL | Status: DC | PRN

## 2014-07-27 MED ORDER — ACETAMINOPHEN 650 MG RE SUPP: 650.0000 mg | Freq: Four times a day (QID) | RECTAL | Status: DC | PRN

## 2014-07-27 NOTE — Progress Notes (Signed)
Report received from [facility] for admission to [phone]

## 2014-07-27 NOTE — ED Notes (Signed)
Heart healthy tray ordered for pt.  

## 2014-07-27 NOTE — ED Notes (Addendum)
Pt reports having bright red blood in her stools x 2, started yesterday. Only complains of mild back pain, no n/v or abd pain. Coumadin pt but stopped taking it last night.

## 2014-07-27 NOTE — ED Notes (Signed)
POC OCCULT resulted POSITIVE
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
## 2014-07-27 NOTE — Progress Notes (Signed)
Amy White [id number] Code Status: Partial Code Admission Data: 07/27/2014 6:39 PM Attending Provider:  Zamora [id number], MD Consults/ Treatment Team:  [facility]   [name] is a 81 y.o. female patient admitted from ED awake, alert - oriented  X 3 - no acute distress noted.  VSS - Blood pressure 141/55, pulse 74, temperature 98.5 F (36.9 C), temperature source Oral, resp. rate 16, height 5\' 7"  (1.702 m), weight 74.299 kg (163 lb 12.8 oz), SpO2 99 %.    IV in place, occlusive dsg intact without redness.  Orientation to room, and floor completed with information packet given to patient/family.  Patient declined safety video at this time.  Admission INP armband ID verified with patient/family, and in place.   SR up x 2, fall assessment complete, with patient and family able to verbalize understanding of risk associated with falls, and verbalized understanding to call nsg before up out of bed.  Call light within reach, patient able to voice, and demonstrate understanding.  Skin, clean-dry- intact without evidence of bruising, or skin tears.   No evidence of skin break down noted on exam.     Will cont to eval and treat per MD orders.  Delman Cheadle, RN 07/27/2014 6:39 PM
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
## 2014-07-28 DIAGNOSIS — K922 Gastrointestinal hemorrhage, unspecified: Secondary | ICD-10-CM

## 2014-07-28 DIAGNOSIS — I482 Chronic atrial fibrillation: Secondary | ICD-10-CM

## 2014-07-28 LAB — CBC
HCT: 33.8 % — ABNORMAL LOW (ref 36.0–46.0)
Hemoglobin: 11.3 g/dL — ABNORMAL LOW (ref 12.0–15.0)
MCH: 29.4 pg (ref 26.0–34.0)
MCHC: 33.4 g/dL (ref 30.0–36.0)
MCV: 87.8 fL (ref 78.0–100.0)
Platelets: 153 10*3/uL (ref 150–400)
RBC: 3.85 MIL/uL — ABNORMAL LOW (ref 3.87–5.11)
RDW: 13.8 % (ref 11.5–15.5)
WBC: 2.9 10*3/uL — ABNORMAL LOW (ref 4.0–10.5)

## 2014-07-28 LAB — PROTIME-INR
INR: 1.74 — ABNORMAL HIGH (ref 0.00–1.49)
Prothrombin Time: 20.5 seconds — ABNORMAL HIGH (ref 11.6–15.2)

## 2014-07-28 LAB — BASIC METABOLIC PANEL
Anion gap: 5 (ref 5–15)
BUN: 13 mg/dL (ref 6–23)
CO2: 25 mmol/L (ref 19–32)
Calcium: 8.8 mg/dL (ref 8.4–10.5)
Chloride: 112 mmol/L (ref 96–112)
Creatinine, Ser: 0.93 mg/dL (ref 0.50–1.10)
GFR calc Af Amer: 63 mL/min — ABNORMAL LOW (ref >90)
GFR calc non Af Amer: 54 mL/min — ABNORMAL LOW (ref >90)
Glucose, Bld: 89 mg/dL (ref 70–99)
Potassium: 4.5 mmol/L (ref 3.5–5.1)
Sodium: 142 mmol/L (ref 135–145)

## 2014-07-28 MED ORDER — LORAZEPAM 2 MG/ML IJ SOLN
INTRAMUSCULAR | Status: AC
  Filled 2014-07-28: qty 1

## 2014-07-28 MED ORDER — POLYETHYLENE GLYCOL 3350 17 G PO PACK
17.0000 g | PACK | Freq: Two times a day (BID) | ORAL | Status: DC
  Administered 2014-07-28 (×2): 17 g via ORAL
  Filled 2014-07-28 (×4): qty 1

## 2014-07-28 NOTE — Progress Notes (Signed)
PATIENT DETAILS Name: Amy White Age: 81 y.o. Sex: female Date of Birth: 04-May-1926 Admit Date: 07/27/2014 Admitting Physician Kelvin Cellar, MD [id number], MD  Subjective: Seen twice today: In morning no major complaints- no BM since admission Second visit-she had transient "spots" in bilateral visual field, she also complains of "bloating" and "dizziness" (subsequently all have resolved). Anxious that she has not had a bowel movement.  Assessment/Plan: Principal Problem:   Lower GI bleed: Seems to be a mild lower GI bleed, in the setting of Coumadin use. Thankfully no further bleeding since admission. Appreciate gastroenterology evaluation, will monitor overnight, if no further bleeding and hemoglobin remains stable-she will be discharged on 2/21.  Active Problems:   History of sick sinus syndrome: Telemetry stable, and sinus. Scheduled for pacemaker implantation as outpatient on 2/23.    History of paroxysmal atrial fibrillation: Sinus rhythm, instructed by cardiology to stop anticoagulation (Coumadin) in preparation for pacemaker implantation on 2/23. Monitor in telemetry.     Breast cancer metastasized to bone: Continue letrozole    Anxiety: Continue with Xanax    Hypothyroidism: Continue with levothyroxine  Disposition: Remain inpatient  Antibiotics:  See below   Anti-infectives    None      DVT Prophylaxis:  SCD's  Code Status: DO NOT intubate  Family Communication Spouse at bedside  Procedures:  None  CONSULTS:  GI  Time spent 40 minutes-which includes 50% of the time with face-to-face with patient/ family and coordinating care related to the above assessment and plan.  MEDICATIONS: Scheduled Meds: . letrozole  2.5 mg Oral Daily  . levothyroxine  75 mcg Oral QAC breakfast  . polyethylene glycol  17 g Oral BID  . sodium chloride  3 mL Intravenous Q12H   Continuous Infusions:  PRN Meds:.acetaminophen **OR**  acetaminophen, albuterol, ALPRAZolam, alum & mag hydroxide-simeth, ondansetron **OR** ondansetron (ZOFRAN) IV, oxyCODONE    PHYSICAL EXAM: Vital signs in last 24 hours: Filed Vitals:   07/27/14 2107 07/28/14 0548 07/28/14 0947 07/28/14 1030  BP: 111/51 102/55 117/46 130/61  Pulse: 65   77  Temp: 98.4 F (36.9 C) 98.2 F (36.8 C)    TempSrc: Oral Oral    Resp: 18 20  16   Height:      Weight:      SpO2: 97% 97%  97%    Weight change:  Filed Weights   07/27/14 1826  Weight: 74.299 kg (163 lb 12.8 oz)   Body mass index is 25.65 kg/(m^2).   Gen Exam: Awake and alert with clear speech.   Neck: Supple, No JVD.   Chest: B/L Clear.   CVS: S1 S2 Regular, no murmurs.  Abdomen: soft, BS +, non tender, non distended.  Extremities: no edema, lower extremities warm to touch. Neurologic: Non Focal.  Skin: No Rash.   Wounds: N/A.    Intake/Output from previous day:  Intake/Output Summary (Last 24 hours) at 07/28/14 1336 Last data filed at 07/28/14 0949  Gross per 24 hour  Intake   1213 ml  Output      0 ml  Net   1213 ml     LAB RESULTS: CBC  Recent Labs Lab 07/25/14 1609 07/27/14 1247 07/27/14 1336 07/28/14 0620  WBC 4.4 3.4*  --  2.9*  HGB 12.0 11.7* 12.2 11.3*  HCT 35.9* 35.0* 36.0 33.8*  PLT 202 155  --  153  MCV 85.9 86.8  --  87.8  MCH 28.7 29.0  --  29.4  MCHC 33.4 33.4  --  33.4  RDW 14.6 13.7  --  13.8  LYMPHSABS  --  0.8  --   --   MONOABS  --  0.4  --   --   EOSABS  --  0.1  --   --   BASOSABS  --  0.0  --   --     Chemistries   Recent Labs Lab 07/25/14 1609 07/27/14 1247 07/27/14 1336 07/28/14 0620  NA 143 141 143 142  K 4.7 4.1 4.1 4.5  CL 109 112 110 112  CO2 26 22  --  25  GLUCOSE 100* 98 97 89  BUN 21 24* 25* 13  CREATININE 1.27* 1.04 0.90 0.93  CALCIUM 9.5 9.1  --  8.8    CBG: No results for input(s): GLUCAP in the last 168 hours.  GFR Estimated Creatinine Clearance: 45.7 mL/min (by C-G formula based on Cr of  0.93).  Coagulation profile  Recent Labs Lab 07/25/14 1609 07/27/14 1247 07/28/14 0620  INR 2.22* 2.12* 1.74*    Cardiac Enzymes No results for input(s): CKMB, TROPONINI, MYOGLOBIN in the last 168 hours.  Invalid input(s): CK  Invalid input(s): POCBNP No results for input(s): DDIMER in the last 72 hours. No results for input(s): HGBA1C in the last 72 hours. No results for input(s): CHOL, HDL, LDLCALC, TRIG, CHOLHDL, LDLDIRECT in the last 72 hours.  Recent Labs  07/27/14 1631  TSH 0.586   No results for input(s): VITAMINB12, FOLATE, FERRITIN, TIBC, IRON, RETICCTPCT in the last 72 hours. No results for input(s): LIPASE, AMYLASE in the last 72 hours.  Urine Studies No results for input(s): UHGB, CRYS in the last 72 hours.  Invalid input(s): UACOL, UAPR, USPG, UPH, UTP, UGL, UKET, UBIL, UNIT, UROB, ULEU, UEPI, UWBC, URBC, UBAC, CAST, UCOM, BILUA  MICROBIOLOGY: No results found for this or any previous visit (from the past 240 hour(s)).  RADIOLOGY STUDIES/RESULTS: No results found.  Oren Binet, MD  [facility] Pager:[phone]  If 7PM-7AM, please contact night-coverage [email or web address] Password [email or web address] 07/28/2014, 1:36 PM   LOS: 1 day

## 2014-07-28 NOTE — Evaluation (Signed)
Physical Therapy Evaluation Patient Details Name: Amy White MRN: [id number] DOB: 1926-03-26 Today's Date: 07/28/2014   History of Present Illness  Patient is a 81 y/o female admitted with acute GI bleed in the setting of Coumadin use. To have pacemaker placement on Tuesday 2/23. PMH of breast ca metastasized to the bone, SSS, HTN and COPD.    Clinical Impression  Patient presents with balance deficits impacting safe mobility. PTA, pt independent for ambulation and ADLs. Would benefit from RW for ambulation as pt staggering right/left and requiring min A for support. Pt has fiance assist with IADLs at home. Recommend more assist at home initially for a few days. Would benefit from skilled PT for gait trial with use of RW vs SPC and to improve balance/overall mobility so pt can maximize independence and minimize fall risk prior to return home.    Follow Up Recommendations Home health PT;Supervision/Assistance - 24 hour    Equipment Recommendations  Other (comment) (TBD - may need RW.)    Recommendations for Other Services OT consult     Precautions / Restrictions Precautions Precautions: Fall Precaution Comments: sick sinus syndrome. Off Coumadin in preparation for pacemaker implantation 2/23. Restrictions Weight Bearing Restrictions: No      Mobility  Bed Mobility               General bed mobility comments: Received sitting in chair upon PT arrival.   Transfers Overall transfer level: Needs assistance Equipment used: None Transfers: Sit to/from Stand Sit to Stand: Min guard         General transfer comment: Min guard due to unsteadiness upons tanding.   Ambulation/Gait Ambulation/Gait assistance: Min assist Ambulation Distance (Feet): 100 Feet (+75') Assistive device: None (Rail.) Gait Pattern/deviations: Step-through pattern;Decreased stride length;Staggering left;Staggering right;Drifts right/left;Scissoring   Gait velocity interpretation: Below normal  speed for age/gender General Gait Details: Pt with slow, unsteady gait reaching for rail at times and holding onto therapist for support. Would benefit from RW for balance. Min A for balance. Dyspnea present. Vitals stable. 1 seated rest break.  Stairs Stairs: Yes Stairs assistance: Min guard Stair Management: Step to pattern;One rail Left;Forwards Number of Stairs: 11 General stair comments: Min guard for stability/balance. Dyspnea present. HR stable 86 bpm/  Wheelchair Mobility    Modified Rankin (Stroke Patients Only)       Balance Overall balance assessment: Needs assistance Sitting-balance support: Feet supported;No upper extremity supported Sitting balance-Leahy Scale: Good     Standing balance support: During functional activity Standing balance-Leahy Scale: Fair Standing balance comment: Pt reaching for furniture for support - baseline.                             Pertinent Vitals/Pain Pain Assessment: No/denies pain    Home Living Family/patient expects to be discharged to:: Private residence Living Arrangements: Alone Available Help at Discharge: Friend(s);Available PRN/intermittently Type of Home: House Home Access: Stairs to enter Entrance Stairs-Rails: Psychiatric nurse of Steps: 2 Home Layout: One level Home Equipment: Cane - single point Additional Comments: pt reports her fiance is over every day and assists with IADLs and driving.    Prior Function Level of Independence: Independent               Hand Dominance        Extremity/Trunk Assessment   Upper Extremity Assessment: Defer to OT evaluation           Lower Extremity Assessment:  Generalized weakness         Communication   Communication: No difficulties  Cognition Arousal/Alertness: Awake/alert Behavior During Therapy: WFL for tasks assessed/performed Overall Cognitive Status: Within Functional Limits for tasks assessed                       General Comments      Exercises        Assessment/Plan    PT Assessment Patient needs continued PT services  PT Diagnosis Difficulty walking;Generalized weakness   PT Problem List Decreased strength;Cardiopulmonary status limiting activity;Decreased balance;Decreased mobility;Decreased knowledge of use of DME  PT Treatment Interventions Balance training;Gait training;Patient/family education;Functional mobility training;Therapeutic activities;Therapeutic exercise;Stair training;DME instruction   PT Goals (Current goals can be found in the Care Plan section) Acute Rehab PT Goals Patient Stated Goal: none stated PT Goal Formulation: With patient Time For Goal Achievement: 08/11/14 Potential to Achieve Goals: Fair    Frequency Min 3X/week   Barriers to discharge Decreased caregiver support Pt lives alone.    Co-evaluation               End of Session Equipment Utilized During Treatment: Gait belt Activity Tolerance: Patient tolerated treatment well Patient left: in chair;with call bell/phone within reach;with family/visitor present Nurse Communication: Mobility status         Time: [id number] PT Time Calculation (min) (ACUTE ONLY): 19 min   Charges:   PT Evaluation $Initial PT Evaluation Tier I: 1 Procedure     PT G CodesCandy Sledge A 29-Jul-2014, 4:18 PM Candy Sledge, [place], DPT [phone]
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
## 2014-07-28 NOTE — Progress Notes (Addendum)
Patient complained of seeing "white spots on the sides of my vision". When asked if she was dizzy patient said she was, but not anymore. Patient said she had this happen before years ago and never did anything about it. Vital signs checked and stable. Dr. Sloan Leiter notified. Patient asked to stay in bed while she is having these symptoms and to call for assistance when ambulating.   12:30 PM Patient has now made RN aware that she had similar symptoms in the past when she took amlodipine stating "it used to make me sick so they stopped it". Patient received a dose prior to her her vision getting spotty this am. Dr. Sloan Leiter made aware.

## 2014-07-29 ENCOUNTER — Telehealth: Payer: Self-pay | Admitting: Physician Assistant

## 2014-07-29 DIAGNOSIS — I4891 Unspecified atrial fibrillation: Secondary | ICD-10-CM

## 2014-07-29 LAB — PROTIME-INR
INR: 1.49 (ref 0.00–1.49)
Prothrombin Time: 18.2 seconds — ABNORMAL HIGH (ref 11.6–15.2)

## 2014-07-29 LAB — CBC
HCT: 34.4 % — ABNORMAL LOW (ref 36.0–46.0)
Hemoglobin: 11.4 g/dL — ABNORMAL LOW (ref 12.0–15.0)
MCH: 28.7 pg (ref 26.0–34.0)
MCHC: 33.1 g/dL (ref 30.0–36.0)
MCV: 86.6 fL (ref 78.0–100.0)
Platelets: 151 10*3/uL (ref 150–400)
RBC: 3.97 MIL/uL (ref 3.87–5.11)
RDW: 13.4 % (ref 11.5–15.5)
WBC: 3 10*3/uL — ABNORMAL LOW (ref 4.0–10.5)

## 2014-07-29 MED ORDER — HYDROCORTISONE ACETATE 25 MG RE SUPP: 25.0000 mg | Freq: Two times a day (BID) | RECTAL | Status: DC

## 2014-07-29 MED ORDER — POLYETHYLENE GLYCOL 3350 17 G PO PACK: 17.0000 g | PACK | Freq: Every day | ORAL | Status: DC

## 2014-07-29 MED ORDER — LACTULOSE 10 GM/15ML PO SOLN
30.0000 g | Freq: Every day | ORAL | Status: DC | PRN
  Filled 2014-07-29 (×2): qty 45

## 2014-07-29 MED ORDER — HYDROCORTISONE ACETATE 25 MG RE SUPP
25.0000 mg | Freq: Two times a day (BID) | RECTAL | Status: DC
Start: 2014-07-29 — End: 2014-07-29
  Filled 2014-07-29: qty 1

## 2014-07-29 MED ORDER — DOCUSATE SODIUM 100 MG PO CAPS: 100.0000 mg | ORAL_CAPSULE | Freq: Two times a day (BID) | ORAL | Status: DC

## 2014-07-29 NOTE — Progress Notes (Signed)
[facility] PROGRESS NOTE Subjective only a small amount of bleeding with bowel movement this morning no drop in hemoglobin. She is to be discharged and proceed with placement of pacemaker on Tuesday.  Objective: Vital signs in last 24 hours: Temp:  [97.6 F (36.4 C)-97.9 F (36.6 C)] 97.6 F (36.4 C) (02/21 0527) Pulse Rate:  [66-72] 66 (02/21 0527) Resp:  [14] 14 (02/21 0527) BP: (119-135)/(45-54) 119/45 mmHg (02/21 0527) SpO2:  [96 %-97 %] 96 % (02/21 0527) Last BM Date: 07/27/14  Intake/Output from previous day: 02/20 0701 - 02/21 0700 In: 601 [P.O.:598; I.V.:3] Out: 450 [Urine:450] Intake/Output this shift:      Lab Results:  Recent Labs  07/27/14 1247 07/27/14 1336 07/28/14 0620 07/29/14 0613  WBC 3.4*  --  2.9* 3.0*  HGB 11.7* 12.2 11.3* 11.4*  HCT 35.0* 36.0 33.8* 34.4*  PLT 155  --  153 151   BMET  Recent Labs  07/27/14 1247 07/27/14 1336 07/28/14 0620  NA 141 143 142  K 4.1 4.1 4.5  CL 112 110 112  CO2 22  --  25  CREATININE 1.04 0.90 0.93   LFT  Recent Labs  07/27/14 1247  PROT 6.2  AST 23  ALT 13  ALKPHOS 44  BILITOT 1.7*   PT/INR  Recent Labs  07/27/14 1247 07/28/14 0620 07/29/14 0613  LABPROT 23.9* 20.5* 18.2*  INR 2.12* 1.74* 1.49   PANCREAS No results for input(s): LIPASE in the last 72 hours.       Studies/Results: No results found.  Medications: I have reviewed the patient's current medications.  Assessment/Plan: 1. Rectal bleeding/history of colon polyps. Long discussion with patient. It appears that her surveillance colonoscopy was delayed because she was being evaluated and treated at the time for breast cancer metastatic bone by Dr. Marin Olp. Appears that the treatment work well. She does need surveillance colonoscopy particularly in view of her recent rectal bleeding. Long discussion with she and her husband in the plan at this time is for her to get her pacemaker placement this coming week and make  an appointment to see Dr. Watt Climes in the office in about a week to schedule surveillance colonoscopy to evaluate her rectal bleeding.  Magalie Almon JR,Demitris Pokorny L 07/29/2014, 10:33 AM

## 2014-07-29 NOTE — Telephone Encounter (Signed)
   Patient has a history of PAF and SSS and scheduled for pacemaker implantation as outpatient on 2/23. Currently holding coumadin for anticipated procedure. She was discharged from the hospital today for GI bleed. She called the after hours answering service because she developed palpitations earlier today after being discharged. She took a xanax and they have seemed to calm down. She knows that if she develops CP, SOB or pre syncope she should come into the ED to be seen. For now i told her to continue to monitor her sx and if she feels okay call the office in the AM for further guidance.   Angelena Form PA-C  [facility]

## 2014-07-29 NOTE — Progress Notes (Signed)
CM met with pt in room to offer choice of home health agency.  Pt is getting a pacemaker on Tuesday 07/31/14 and is (politely) refusing HHPT until after her procedure.  No other CM needs were communicated.  Mariane Masters, BSN, CM [phone].

## 2014-07-29 NOTE — Progress Notes (Signed)
[name] to be D/C'd Home per MD order.  Discussed with the patient and all questions fully answered.    Medication List    STOP taking these medications        warfarin 5 MG tablet  Commonly known as:  COUMADIN      TAKE these medications        albuterol 108 (90 BASE) MCG/ACT inhaler  Commonly known as:  PROVENTIL HFA;VENTOLIN HFA  Inhale 2 puffs into the lungs every 6 (six) hours as needed for wheezing or shortness of breath.     ALPRAZolam 0.5 MG tablet  Commonly known as:  XANAX  Take 0.25 mg by mouth 2 (two) times daily as needed for anxiety.     amLODipine 2.5 MG tablet  Commonly known as:  NORVASC  Take 1 tablet (2.5 mg total) by mouth daily.     cholecalciferol 1000 UNITS tablet  Commonly known as:  VITAMIN D  Take 1,000 Units by mouth 3 (three) times a week.     docusate sodium 100 MG capsule  Commonly known as:  COLACE  Take 1 capsule (100 mg total) by mouth 2 (two) times daily.     hydrocortisone 25 MG suppository  Commonly known as:  ANUSOL-HC  Place 1 suppository (25 mg total) rectally 2 (two) times daily.     letrozole 2.5 MG tablet  Commonly known as:  FEMARA  Take 2.5 mg by mouth daily.     levothyroxine 75 MCG tablet  Commonly known as:  SYNTHROID, LEVOTHROID  Take 75 mg by mouth Daily.     lisinopril 10 MG tablet  Commonly known as:  PRINIVIL,ZESTRIL  TAKE 1 TABLET (10 MG TOTAL) BY MOUTH DAILY.     polyethylene glycol packet  Commonly known as:  MIRALAX / GLYCOLAX  Take 17 g by mouth daily.     tetrahydrozoline-zinc 0.05-0.25 % ophthalmic solution  Commonly known as:  VISINE-AC  Place 2 drops into both eyes 3 (three) times daily as needed (red eyes).     Zoledronic Acid 4 MG/100ML IVPB  Commonly known as:  ZOMETA  Inject 4 mg into the vein every 3 (three) months.        VVS, Skin clean, dry and intact without evidence of skin break down, no evidence of skin tears noted. IV catheter discontinued intact. Site without signs and  symptoms of complications. Dressing and pressure applied.  An After Visit Summary was printed and given to the patient.  D/c education completed with patient/family including follow up instructions, medication list, d/c activities limitations if indicated, with other d/c instructions as indicated by MD - patient able to verbalize understanding, all questions fully answered.   Patient instructed to return to ED, call 911, or call MD for any changes in condition.   Patient escorted via [place], and D/C home via private auto.  Delman Cheadle 07/29/2014 10:12 AM

## 2014-07-29 NOTE — Discharge Summary (Signed)
PATIENT DETAILS Name: Amy White Age: 81 y.o. Sex: female Date of Birth: 10-Apr-1926 MRN: [id number]. Admitting Physician: Kelvin Cellar, MD [id number], MD  Admit Date: 07/27/2014 Discharge date: 07/29/2014  Recommendations for Outpatient Follow-up:  Recheck CBC in 1 week Refer to GI post Pacemake placement  PRIMARY DISCHARGE DIAGNOSIS:  Principal Problem:   GI bleed Active Problems:   Breast cancer metastasized to bone   Atrial fibrillation   Long term current use of anticoagulant therapy   SSS (sick sinus syndrome)      PAST MEDICAL HISTORY: Past Medical History  Diagnosis Date  . Atrial fibrillation   . Hypertension   . Hyperthyroidism   . COPD (chronic obstructive pulmonary disease)   . Asthma   . GERD (gastroesophageal reflux disease)   . H/O hiatal hernia   . Arthritis     "all over"  . Anxiety   . Slow urinary stream   . Breast cancer metastasized to bone 05/13/2011  . Breast cancer 1997    "left"    DISCHARGE MEDICATIONS: Current Discharge Medication List    START taking these medications   Details  docusate sodium (COLACE) 100 MG capsule Take 1 capsule (100 mg total) by mouth 2 (two) times daily. Qty: 60 capsule, Refills: 0    hydrocortisone (ANUSOL-HC) 25 MG suppository Place 1 suppository (25 mg total) rectally 2 (two) times daily. Qty: 6 suppository, Refills: 0    polyethylene glycol (MIRALAX / GLYCOLAX) packet Take 17 g by mouth daily. Qty: 14 each, Refills: 0      CONTINUE these medications which have NOT CHANGED   Details  albuterol (PROVENTIL HFA;VENTOLIN HFA) 108 (90 BASE) MCG/ACT inhaler Inhale 2 puffs into the lungs every 6 (six) hours as needed for wheezing or shortness of breath.    ALPRAZolam (XANAX) 0.5 MG tablet Take 0.25 mg by mouth 2 (two) times daily as needed for anxiety.  Refills: 0    cholecalciferol (VITAMIN D) 1000 UNITS tablet Take 1,000 Units by mouth 3 (three) times a week.    letrozole (FEMARA) 2.5  MG tablet Take 2.5 mg by mouth daily.    levothyroxine (SYNTHROID, LEVOTHROID) 75 MCG tablet Take 75 mg by mouth Daily.   Associated Diagnoses: Breast cancer metastasized to bone; Breast cancer metastasized to bone    lisinopril (PRINIVIL,ZESTRIL) 10 MG tablet TAKE 1 TABLET (10 MG TOTAL) BY MOUTH DAILY. Qty: 30 tablet, Refills: 9    tetrahydrozoline-zinc (VISINE-AC) 0.05-0.25 % ophthalmic solution Place 2 drops into both eyes 3 (three) times daily as needed (red eyes).    Zoledronic Acid (ZOMETA) 4 MG/100ML IVPB Inject 4 mg into the vein every 3 (three) months.     amLODipine (NORVASC) 2.5 MG tablet Take 1 tablet (2.5 mg total) by mouth daily. Qty: 30 tablet, Refills: 0      STOP taking these medications     warfarin (COUMADIN) 5 MG tablet         ALLERGIES:   Allergies  Allergen Reactions  . Ciprofloxacin Rash    Rash in the mouth  . Penicillins Hives and Rash    BRIEF HPI:  See H&P, Labs, Consult and Test reports for all details in brief, patient was admitted for evaluation of hematochezia  CONSULTATIONS:   GI  PERTINENT RADIOLOGIC STUDIES: No results found.   PERTINENT LAB RESULTS: CBC:  Recent Labs  07/28/14 0620 07/29/14 0613  WBC 2.9* 3.0*  HGB 11.3* 11.4*  HCT 33.8* 34.4*  PLT 153 151   CMET  CMP     Component Value Date/Time   NA 142 07/28/2014 0620   NA 144 05/10/2014 1141   K 4.5 07/28/2014 0620   K 4.4 05/10/2014 1141   CL 112 07/28/2014 0620   CL 109* 05/10/2014 1141   CO2 25 07/28/2014 0620   CO2 28 05/10/2014 1141   GLUCOSE 89 07/28/2014 0620   GLUCOSE 82 05/10/2014 1141   BUN 13 07/28/2014 0620   BUN 16 05/10/2014 1141   CREATININE 0.93 07/28/2014 0620   CREATININE 1.27* 07/25/2014 1609   CALCIUM 8.8 07/28/2014 0620   CALCIUM 9.3 05/10/2014 1141   PROT 6.2 07/27/2014 1247   PROT 6.8 05/10/2014 1141   ALBUMIN 3.8 07/27/2014 1247   AST 23 07/27/2014 1247   AST 22 05/10/2014 1141   ALT 13 07/27/2014 1247   ALT 18 05/10/2014  1141   ALKPHOS 44 07/27/2014 1247   ALKPHOS 46 05/10/2014 1141   BILITOT 1.7* 07/27/2014 1247   BILITOT 1.20 05/10/2014 1141   GFRNONAA 54* 07/28/2014 0620   GFRAA 63* 07/28/2014 0620    GFR Estimated Creatinine Clearance: 45.7 mL/min (by C-G formula based on Cr of 0.93). No results for input(s): LIPASE, AMYLASE in the last 72 hours. No results for input(s): CKTOTAL, CKMB, CKMBINDEX, TROPONINI in the last 72 hours. Invalid input(s): POCBNP No results for input(s): DDIMER in the last 72 hours. No results for input(s): HGBA1C in the last 72 hours. No results for input(s): CHOL, HDL, LDLCALC, TRIG, CHOLHDL, LDLDIRECT in the last 72 hours.  Recent Labs  07/27/14 1631  TSH 0.586   No results for input(s): VITAMINB12, FOLATE, FERRITIN, TIBC, IRON, RETICCTPCT in the last 72 hours. Coags:  Recent Labs  07/28/14 0620 07/29/14 0613  INR 1.74* 1.49   Microbiology: No results found for this or any previous visit (from the past 240 hour(s)).   BRIEF HOSPITAL COURSE:  Lower GI bleed: Seems to be a mild lower GI bleed, in the setting of Coumadin use-likely secondary to hemorrhoids. Thankfully no further bleeding since admission.Gastroenterology evaluation was completed this admit,no need for inpatient colonocsopy. Hb remained stable, brown stools this am with a ?tinge of blood. Will discharge today, continue anusol suppository for a few more days on discharge. She may need referral to GI post pacemaker placement.   Active Problems:  History of sick sinus syndrome: . Scheduled for pacemaker implantation as outpatient on 2/23.   History of paroxysmal atrial fibrillation: Sinus rhythm, instructed by cardiology to stop anticoagulation (Coumadin) in preparation for pacemaker implantation on 2/23.    Breast cancer metastasized to bone: Continue letrozole   Anxiety: Continue with Xanax   Hypothyroidism: Continue with levothyroxine   TODAY-DAY OF DISCHARGE:  Subjective:   Amy White today has no headache,no chest abdominal pain,no new weakness tingling or numbness, feels much better wants to go home today.   Objective:   Blood pressure 119/45, pulse 66, temperature 97.6 F (36.4 C), temperature source Oral, resp. rate 14, height 5\' 7"  (1.702 m), weight 74.299 kg (163 lb 12.8 oz), SpO2 96 %.  Intake/Output Summary (Last 24 hours) at 07/29/14 0915 Last data filed at 07/28/14 2200  Gross per 24 hour  Intake    361 ml  Output    200 ml  Net    161 ml   Filed Weights   07/27/14 1826  Weight: 74.299 kg (163 lb 12.8 oz)    Exam Awake Alert, Oriented *3, No new F.N deficits, Normal affect [place].AT,PERRAL Supple Neck,No JVD,  No cervical lymphadenopathy appriciated.  Symmetrical Chest wall movement, Good air movement bilaterally, CTAB RRR,No Gallops,Rubs or new Murmurs, No Parasternal Heave +ve B.Sounds, Abd Soft, Non tender, No organomegaly appriciated, No rebound -guarding or rigidity. No Cyanosis, Clubbing or edema, No new Rash or bruise  DISCHARGE CONDITION: Stable  DISPOSITION: Home with home health services  DISCHARGE INSTRUCTIONS:    Activity:  As tolerated   Diet recommendation: Heart Healthy diet  Discharge Instructions    Call MD for:    Complete by:  As directed   Worsening rectal bleeding     Diet - low sodium heart healthy    Complete by:  As directed      Increase activity slowly    Complete by:  As directed            Follow-up Information    Follow up with SHAW,KIMBERLEE, MD. Schedule an appointment as soon as possible for a visit in 1 week.   Specialty:  Family Medicine   Why:  please check CBC in 1 week.   Contact information:   [address] [place] [address] [phone]         Total Time spent on discharge equals 45 minutes.  SignedOren Binet 07/29/2014 9:15 AM

## 2014-07-30 MED ORDER — VANCOMYCIN HCL IN DEXTROSE 1-5 GM/200ML-% IV SOLN
1000.0000 mg | INTRAVENOUS | Status: DC
  Filled 2014-07-30 (×2): qty 200

## 2014-07-30 MED ORDER — SODIUM CHLORIDE 0.9 % IR SOLN
80.0000 mg | Status: DC
  Filled 2014-07-30: qty 2

## 2014-07-30 NOTE — Care Management Note (Signed)
    Page 1 of 1   07/30/2014     2:49:41 PM CARE MANAGEMENT NOTE 07/30/2014  Patient:  Amy White, Amy White   Account Number:  [id number]  Date Initiated:  07/30/2014  Documentation initiated by:  Tomi Bamberger  Subjective/Objective Assessment:   dx gib  admit-from home alone.     Action/Plan:   pt eval- rec hhpt   Anticipated DC Date:  07/29/2014   Anticipated DC Plan:  [place]  CM consult      [facility] Choice  HOME HEALTH   Choice offered to / List presented to:  C-1 Patient        [place] arranged  [place] - 11 Patient Refused      Status of service:  Completed, signed off Medicare Important Message given?  NA - LOS <3 / Initial given by admissions (If response is "NO", the following Medicare IM given date fields will be blank) Date Medicare IM given:   Medicare IM given by:   Date Additional Medicare IM given:   Additional Medicare IM given by:    Discharge Disposition:  HOME/SELF CARE  Per UR Regulation:  Reviewed for med. necessity/level of care/duration of stay  If discussed at [place] of Stay Meetings, dates discussed:    Comments:  07/30/14 [place], BSN [phone] NCM called patient at home to offer hhpt, she stated she does not think she needs hhpt, she will be back in tomorrow for a pacemaker insertion and she would like to see how she does after that before she decided on hhpt.

## 2014-07-31 ENCOUNTER — Encounter (HOSPITAL_COMMUNITY)
Admission: RE | Disposition: A | Discharge status: Home / Self Care | Payer: Self-pay | Source: Ambulatory Visit | Attending: Cardiovascular Disease

## 2014-07-31 ENCOUNTER — Ambulatory Visit (HOSPITAL_COMMUNITY)
Admission: RE | Admit: 2014-07-31 | Discharge: 2014-08-01 | Disposition: A | Discharge status: Home / Self Care | Payer: Medicare Other | Source: Ambulatory Visit | Attending: Cardiovascular Disease | Admitting: Cardiovascular Disease

## 2014-07-31 ENCOUNTER — Encounter (HOSPITAL_COMMUNITY): Payer: Self-pay | Admitting: *Deleted

## 2014-07-31 ENCOUNTER — Telehealth: Payer: Self-pay | Admitting: Cardiovascular Disease

## 2014-07-31 DIAGNOSIS — J449 Chronic obstructive pulmonary disease, unspecified: Secondary | ICD-10-CM | POA: Diagnosis not present

## 2014-07-31 DIAGNOSIS — T368X4A Poisoning by other systemic antibiotics, undetermined, initial encounter: Secondary | ICD-10-CM | POA: Insufficient documentation

## 2014-07-31 DIAGNOSIS — Z85830 Personal history of malignant neoplasm of bone: Secondary | ICD-10-CM | POA: Diagnosis not present

## 2014-07-31 DIAGNOSIS — I480 Paroxysmal atrial fibrillation: Secondary | ICD-10-CM | POA: Insufficient documentation

## 2014-07-31 DIAGNOSIS — Z7901 Long term (current) use of anticoagulants: Secondary | ICD-10-CM | POA: Insufficient documentation

## 2014-07-31 DIAGNOSIS — Z853 Personal history of malignant neoplasm of breast: Secondary | ICD-10-CM | POA: Insufficient documentation

## 2014-07-31 DIAGNOSIS — I10 Essential (primary) hypertension: Secondary | ICD-10-CM | POA: Insufficient documentation

## 2014-07-31 DIAGNOSIS — J45909 Unspecified asthma, uncomplicated: Secondary | ICD-10-CM | POA: Diagnosis not present

## 2014-07-31 DIAGNOSIS — Z880 Allergy status to penicillin: Secondary | ICD-10-CM | POA: Diagnosis not present

## 2014-07-31 DIAGNOSIS — T50905A Adverse effect of unspecified drugs, medicaments and biological substances, initial encounter: Secondary | ICD-10-CM | POA: Diagnosis present

## 2014-07-31 DIAGNOSIS — R001 Bradycardia, unspecified: Secondary | ICD-10-CM | POA: Diagnosis present

## 2014-07-31 DIAGNOSIS — I495 Sick sinus syndrome: Secondary | ICD-10-CM

## 2014-07-31 DIAGNOSIS — T368X1A Poisoning by other systemic antibiotics, accidental (unintentional), initial encounter: Secondary | ICD-10-CM | POA: Diagnosis not present

## 2014-07-31 DIAGNOSIS — K219 Gastro-esophageal reflux disease without esophagitis: Secondary | ICD-10-CM | POA: Diagnosis not present

## 2014-07-31 DIAGNOSIS — Z950 Presence of cardiac pacemaker: Secondary | ICD-10-CM

## 2014-07-31 HISTORY — DX: Sick sinus syndrome: I49.5

## 2014-07-31 HISTORY — PX: PERMANENT PACEMAKER INSERTION: SHX5480

## 2014-07-31 LAB — SURGICAL PCR SCREEN
MRSA, PCR: NEGATIVE
Staphylococcus aureus: NEGATIVE

## 2014-07-31 SURGERY — PERMANENT PACEMAKER INSERTION

## 2014-07-31 MED ORDER — ALBUTEROL SULFATE (2.5 MG/3ML) 0.083% IN NEBU: 2.5000 mg | INHALATION_SOLUTION | Freq: Four times a day (QID) | RESPIRATORY_TRACT | Status: DC | PRN

## 2014-07-31 MED ORDER — LEVOTHYROXINE SODIUM 75 MCG PO TABS
75.0000 ug | ORAL_TABLET | Freq: Every day | ORAL | Status: DC
  Administered 2014-08-01: 07:00:00 75 ug via ORAL
  Filled 2014-07-31 (×2): qty 1

## 2014-07-31 MED ORDER — SODIUM CHLORIDE 0.9 % IV SOLN: INTRAVENOUS | Status: DC

## 2014-07-31 MED ORDER — LETROZOLE 2.5 MG PO TABS
2.5000 mg | ORAL_TABLET | Freq: Every day | ORAL | Status: DC
  Administered 2014-07-31 – 2014-08-01 (×2): 2.5 mg via ORAL
  Filled 2014-07-31 (×2): qty 1

## 2014-07-31 MED ORDER — SODIUM CHLORIDE 0.9 % IJ SOLN: 3.0000 mL | INTRAMUSCULAR | Status: DC | PRN

## 2014-07-31 MED ORDER — POLYETHYLENE GLYCOL 3350 17 G PO PACK
17.0000 g | PACK | Freq: Every day | ORAL | Status: DC
  Administered 2014-07-31: 20:00:00 17 g via ORAL
  Filled 2014-07-31 (×2): qty 1

## 2014-07-31 MED ORDER — LISINOPRIL 10 MG PO TABS
10.0000 mg | ORAL_TABLET | Freq: Every day | ORAL | Status: DC
  Administered 2014-08-01: 10:00:00 10 mg via ORAL
  Filled 2014-07-31: qty 1

## 2014-07-31 MED ORDER — HYDROCORTISONE ACETATE 25 MG RE SUPP
25.0000 mg | Freq: Two times a day (BID) | RECTAL | Status: DC
  Administered 2014-08-01: 10:00:00 25 mg via RECTAL
  Filled 2014-07-31 (×3): qty 1

## 2014-07-31 MED ORDER — ACETAMINOPHEN 325 MG PO TABS
325.0000 mg | ORAL_TABLET | ORAL | Status: DC | PRN
  Administered 2014-07-31: 650 mg via ORAL
  Administered 2014-08-01: 07:00:00 325 mg via ORAL
  Filled 2014-07-31 (×2): qty 2

## 2014-07-31 MED ORDER — MUPIROCIN 2 % EX OINT: 1.0000 "application " | TOPICAL_OINTMENT | Freq: Once | CUTANEOUS | Status: DC

## 2014-07-31 MED ORDER — ALPRAZOLAM 0.25 MG PO TABS
0.2500 mg | ORAL_TABLET | Freq: Two times a day (BID) | ORAL | Status: DC | PRN
  Administered 2014-07-31: 21:00:00 0.25 mg via ORAL
  Filled 2014-07-31: qty 1

## 2014-07-31 MED ORDER — ONDANSETRON HCL 4 MG/2ML IJ SOLN: 4.0000 mg | Freq: Four times a day (QID) | INTRAMUSCULAR | Status: DC | PRN

## 2014-07-31 MED ORDER — MIDAZOLAM HCL 5 MG/5ML IJ SOLN
INTRAMUSCULAR | Status: AC
  Filled 2014-07-31: qty 5

## 2014-07-31 MED ORDER — DIPHENHYDRAMINE HCL 25 MG PO CAPS: 25.0000 mg | ORAL_CAPSULE | Freq: Four times a day (QID) | ORAL | Status: DC | PRN

## 2014-07-31 MED ORDER — DOCUSATE SODIUM 100 MG PO CAPS
100.0000 mg | ORAL_CAPSULE | Freq: Two times a day (BID) | ORAL | Status: DC
  Filled 2014-07-31 (×3): qty 1

## 2014-07-31 MED ORDER — ALBUTEROL SULFATE HFA 108 (90 BASE) MCG/ACT IN AERS: 2.0000 | INHALATION_SPRAY | Freq: Four times a day (QID) | RESPIRATORY_TRACT | Status: DC | PRN

## 2014-07-31 MED ORDER — MUPIROCIN 2 % EX OINT
TOPICAL_OINTMENT | CUTANEOUS | Status: AC
  Filled 2014-07-31: qty 22

## 2014-07-31 MED ORDER — LIDOCAINE HCL (PF) 1 % IJ SOLN
INTRAMUSCULAR | Status: AC
  Filled 2014-07-31: qty 60

## 2014-07-31 MED ORDER — HEPARIN (PORCINE) IN NACL 2-0.9 UNIT/ML-% IJ SOLN
INTRAMUSCULAR | Status: AC
  Filled 2014-07-31: qty 500

## 2014-07-31 MED ORDER — SODIUM CHLORIDE 0.9 % IV SOLN
INTRAVENOUS | Status: DC
  Administered 2014-07-31: 12:00:00 via INTRAVENOUS

## 2014-07-31 MED ORDER — METOPROLOL TARTRATE 25 MG PO TABS
25.0000 mg | ORAL_TABLET | Freq: Two times a day (BID) | ORAL | Status: DC
  Administered 2014-07-31 – 2014-08-01 (×2): 25 mg via ORAL
  Filled 2014-07-31 (×2): qty 1

## 2014-07-31 MED ORDER — FENTANYL CITRATE 0.05 MG/ML IJ SOLN
INTRAMUSCULAR | Status: AC
  Filled 2014-07-31: qty 2

## 2014-07-31 MED ORDER — YOU HAVE A PACEMAKER BOOK
Freq: Once | Status: AC
  Administered 2014-07-31: 22:00:00
  Filled 2014-07-31: qty 1

## 2014-07-31 NOTE — Progress Notes (Signed)
Report called to Jim  Pt to rm [phone]

## 2014-07-31 NOTE — Telephone Encounter (Signed)
Pt called in stating that Dr. Loletha Grayer will be putting a pace maker in today and she would like to know if it is ok for her to take her medications. Please f/u with her  Thanks

## 2014-07-31 NOTE — Interval H&P Note (Signed)
History and Physical Interval Note:  07/31/2014 1:30 PM  Amy White  has presented today for surgery, with the diagnosis of fff  The various methods of treatment have been discussed with the patient and family. After consideration of risks, benefits and other options for treatment, the patient has consented to  Procedure(s): PERMANENT PACEMAKER INSERTION (N/A) as a surgical intervention .  The patient's history has been reviewed, patient examined, no change in status, stable for surgery.  I have reviewed the patient's chart and labs.  Questions were answered to the patient's satisfaction.     Kaedan Richert

## 2014-07-31 NOTE — Telephone Encounter (Signed)
Pt advised to take medications as scheduled per Dr. Loletha Grayer. She voiced understanding.

## 2014-07-31 NOTE — Op Note (Signed)
Procedure report  Procedure performed:  1. Implantation of new dual chamber permanent pacemaker 2. Fluoroscopy 3. 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 The patient was prepped and draped in usual sterile fashion. Local anesthesia with 1% lidocaine was administered to to the right infraclavicular area. A 5-6 cm horizontal incision was made parallel with and 2-3 cm caudal to the left clavicle. Using electrocautery and blunt dissection a prepectoral pocket was created down to the level of the pectoralis major muscle fascia. The pocket was carefully inspected for hemostasis. An antibiotic-soaked sponge was placed in the pocket.  Under fluoroscopic guidance and using the modified Seldinger technique 2 separate venipunctures were performed to access the left subclavian vein. No difficulty was encountered accessing the vein.  Two J-tip guidewires were subsequently exchanged for two 7 French safe sheaths.  Under fluoroscopic  guidance the ventricular lead was advanced to level of the mid to apical right ventricular septum and thet active-fixation helix was deployed. Prominent current of injury was seen. Satisfactory pacing and sensing parameters were recorded. There was no evidence of diaphragmatic stimulation at maximum device output. The safe sheath was peeled away and the lead was secured in place with 2-0 silk.  In similar fashion the right atrial lead was advanced to the level of the atrial appendage. The active-fixation helix was deployed. There was prominent current of injury. Satisfactory  pacing and sensing parameters were recorded. There was no evidence of diaphragmatic stimulation with pacing at maximum device output. The safe sheath was peeled away and the lead was secured in place with 2-0 silk.  The antibiotic-soaked sponge was removed from the pocket. The pocket was flushed with copious amounts of antibiotic solution. Reinspection showed excellent hemostasis..  The ventricular lead was connected to the generator and appropriate ventricular pacing was seen. Subsequently the atrial lead was also connected. Repeat testing of the lead parameters later showed excellent values.  The entire system was then carefully inserted in the pocket with care been taking that the leads and device assumed a comfortable position without pressure on the incision. Great care was taken that the leads be located deep to the generator. The pocket was then closed in layers using 2 layers of 2-0 Vicryl and cutaneous staples, after which a sterile dressing was applied.  At the end of the procedure the following lead parameters were encountered:  Right atrial lead  sensed P waves 1.6-2.1 mV, impedance 739 ohms, threshold 1.0 V at 0.5 ms pulse width.  Right ventricular lead sensed R waves 11.7 mV, impedance 1191 ohms, threshold 1.1 V at 0.5 ms pulse width.   Sanda Klein, MD, [facility] [phone] office [phone]  pager
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
## 2014-08-01 ENCOUNTER — Encounter (HOSPITAL_COMMUNITY): Payer: Self-pay | Admitting: Physician Assistant

## 2014-08-01 ENCOUNTER — Ambulatory Visit (HOSPITAL_COMMUNITY): Payer: Medicare Other

## 2014-08-01 DIAGNOSIS — I10 Essential (primary) hypertension: Secondary | ICD-10-CM | POA: Diagnosis not present

## 2014-08-01 DIAGNOSIS — I480 Paroxysmal atrial fibrillation: Secondary | ICD-10-CM | POA: Diagnosis not present

## 2014-08-01 DIAGNOSIS — J449 Chronic obstructive pulmonary disease, unspecified: Secondary | ICD-10-CM | POA: Diagnosis not present

## 2014-08-01 DIAGNOSIS — I495 Sick sinus syndrome: Secondary | ICD-10-CM | POA: Diagnosis not present

## 2014-08-01 MED ORDER — DIPHENHYDRAMINE HCL 25 MG PO CAPS: 25.0000 mg | ORAL_CAPSULE | Freq: Four times a day (QID) | ORAL | Status: DC | PRN

## 2014-08-01 MED ORDER — METOPROLOL TARTRATE 25 MG PO TABS: 25.0000 mg | ORAL_TABLET | Freq: Two times a day (BID) | ORAL | Status: DC

## 2014-08-01 NOTE — Discharge Instructions (Signed)

## 2014-08-01 NOTE — Progress Notes (Signed)
Patient Name: Amy White Date of Encounter: 08/01/2014  Active Problems:   SSS (sick sinus syndrome)   Drug-induced bradycardia, necessary medications for atrial fibrillation   Vancomycin overdose of undetermined intent   Pacemaker   Length of Stay:   SUBJECTIVE  Slightly sore at surgical site CXR looks good Device interrogation with favorable and stable parameters  CURRENT MEDS . docusate sodium  100 mg Oral BID  . hydrocortisone  25 mg Rectal BID  . letrozole  2.5 mg Oral Daily  . levothyroxine  75 mcg Oral QAC breakfast  . lisinopril  10 mg Oral Daily  . metoprolol tartrate  25 mg Oral BID  . polyethylene glycol  17 g Oral Daily    OBJECTIVE   Intake/Output Summary (Last 24 hours) at 08/01/14 0836 Last data filed at 08/01/14 0557  Gross per 24 hour  Intake    320 ml  Output    400 ml  Net    -80 ml   Filed Weights   07/31/14 1149 08/01/14 0053  Weight: 160 lb (72.576 kg) 160 lb 15 oz (73 kg)    PHYSICAL EXAM Filed Vitals:   07/31/14 1940 07/31/14 2347 08/01/14 0053 08/01/14 0609  BP: 157/67 153/54  158/63  Pulse: 73 69  62  Temp: 98.4 F (36.9 C) 98 F (36.7 C)  98.1 F (36.7 C)  TempSrc: Oral Oral  Oral  Resp: 18 18  20   Height:      Weight:   160 lb 15 oz (73 kg)   SpO2: 98% 96%  94%   General: Alert, oriented x3, no distress Head: no evidence of trauma, PERRL, EOMI, no exophtalmos or lid lag, no myxedema, no xanthelasma; normal ears, nose and oropharynx Neck: normal jugular venous pulsations and no hepatojugular reflux; brisk carotid pulses without delay and no carotid bruits Chest: clear to auscultation, no signs of consolidation by percussion or palpation, normal fremitus, symmetrical and full respiratory excursions; small amount of oozing at right subclavian pacer site Cardiovascular: normal position and quality of the apical impulse, regular rhythm, normal first and second heart sounds, no rubs or gallops, no murmur Abdomen: no tenderness  or distention, no masses by palpation, no abnormal pulsatility or arterial bruits, normal bowel sounds, no hepatosplenomegaly Extremities: no clubbing, cyanosis or edema; 2+ radial, ulnar and brachial pulses bilaterally; 2+ right femoral, posterior tibial and dorsalis pedis pulses; 2+ left femoral, posterior tibial and dorsalis pedis pulses; no subclavian or femoral bruits Neurological: grossly nonfocal   Radiology Studies Imaging results have been reviewed and Dg Chest 2 View  08/01/2014   CLINICAL DATA:  Status post pacemaker placement  EXAM: CHEST  2 VIEW  COMPARISON:  THERE IS DEGENERATIVE CHANGE OF THE RIGHT SHOULDER.  THERE IS WEDGE COMPRESSION OF THE BODY OF T12 WITH LOSS OF HEIGHT ANTERIORLY OF 70%.: COMPARISON: THERE IS DEGENERATIVE CHANGE OF THE RIGHT SHOULDER. THERE IS WEDGE COMPRESSION OF THE BODY OF T12 WITH LOSS OF HEIGHT ANTERIORLY OF 70%. Portable chest x-ray of March 29, 2014  FINDINGS: The patient has undergone interval placement of a permanent pacemaker with the generator overlying the right upper hemithorax. The electrodes are in appropriate position radiographically. The lungs are mildly hyperinflated with hemidiaphragm flattening. There is no focal infiltrate or pleural effusion. The patient has undergone previous left mastectomy. There are numerous surgical clips in the left axillary region. There is chronic wedge compression of the body of T12. There are chronic degenerative changes of the right shoulder.  IMPRESSION:  There is no postprocedure complication following placement of the permanent pacemaker. There are stable findings of COPD, left mastectomy, and T12 wedge compression.   Electronically Signed   By: David  Martinique   On: 08/01/2014 07:32    TELE NSR, occasional PVC, occasional atrial paced beats  ECG A paced, Vs  sensed  ASSESSMENT AND PLAN DC home Restart warfarin Recheck H/H in 1-2 weeks Wound check 7-10 days Office visit with me on a Tuesday in 5-6  weeks Activity restrictions and wound care instructions given Metoprolol 25 mg BID added back - may need titration in the future depending on ventricular rate during AFib.   Sanda Klein, MD, [facility] [phone] office [phone] pager 08/01/2014 8:36 AM

## 2014-08-01 NOTE — Discharge Summary (Signed)
CARDIOLOGY DISCHARGE SUMMARY   Patient ID: Amy White MRN: [id number] DOB/AGE: [birth year] 79 y.o.  Admit date: 07/31/2014 Discharge date: 08/01/2014  PCP: Mayra Neer, MD Primary Cardiologist:   Primary Discharge Diagnosis:  SSS (Sick Sinus Syndrome) Secondary Discharge Diagnosis:    Drug-induced bradycardia, necessary medications for atrial fibrillation   Vancomycin overdose of undetermined intent   Pacemaker  Procedures:  1. Implantation of new dual chamber  2. Fluoroscopy 3. Light sedation  Hospital Course: Amy White is a 81 y.o. female with a history of PAF, chronic anticoagulation with coumadin, bradycardia secondary to beta blockers and intermittent complete heart block. She was seen by Dr. Sallyanne Kuster who felt a pacemaker was indicated. She came to the hospital for the procedure on 07/31/2014.  Amy White had a Med[employer]onic Advisa DR MRI model [id number] serial number [id number] permanent pacemaker implanted with good pacer function. However, she developed facial redness and scalp itching after being given vancomycin intravenously, suggesting "Redman" syndrome. The vancomycin was discontinued and listed as an allergy. She was given when necessary medications and her symptoms improved. She was admitted overnight.   On 08/01/2014 she was seen by Dr. Sallyanne Kuster and the data were reviewed. Her pacer site showed minimal oozing and no hematoma. The dressing is to be left in place until a.m. She is to restart her Coumadin and restart her beta blocker. Her pacemaker is functioning well and she is a pacing at times with ventricular sensing.  Her chest x-ray results are below and show no complication from the pacemaker. No further inpatient workup is indicated and she is considered stable for discharge, to follow up as an outpatient.  Labs:  Lab Results  Component Value Date   WBC 3.0* 07/29/2014   HGB 11.4* 07/29/2014   HCT 34.4* 07/29/2014   MCV 86.6 07/29/2014   PLT 151  07/29/2014    Recent Labs Lab 07/27/14 1247  07/28/14 0620  NA 141  < > 142  K 4.1  < > 4.5  CL 112  < > 112  CO2 22  --  25  BUN 24*  < > 13  CREATININE 1.04  < > 0.93  CALCIUM 9.1  --  8.8  PROT 6.2  --   --   BILITOT 1.7*  --   --   ALKPHOS 44  --   --   ALT 13  --   --   AST 23  --   --   GLUCOSE 98  < > 89  < > = values in this interval not displayed.    Radiology: Dg Chest 2 View 08/01/2014   CLINICAL DATA:  Status post pacemaker placement  EXAM: CHEST  2 VIEW  COMPARISON:  THERE IS DEGENERATIVE CHANGE OF THE RIGHT SHOULDER.  THERE IS WEDGE COMPRESSION OF THE BODY OF T12 WITH LOSS OF HEIGHT ANTERIORLY OF 70%.: COMPARISON: THERE IS DEGENERATIVE CHANGE OF THE RIGHT SHOULDER. THERE IS WEDGE COMPRESSION OF THE BODY OF T12 WITH LOSS OF HEIGHT ANTERIORLY OF 70%. Portable chest x-ray of March 29, 2014  FINDINGS: The patient has undergone interval placement of a permanent pacemaker with the generator overlying the right upper hemithorax. The electrodes are in appropriate position radiographically. The lungs are mildly hyperinflated with hemidiaphragm flattening. There is no focal infiltrate or pleural effusion. The patient has undergone previous left mastectomy. There are numerous surgical clips in the left axillary region. There is chronic wedge compression of the body of T12. There are chronic degenerative  changes of the right shoulder.  IMPRESSION: There is no postprocedure complication following placement of the permanent pacemaker. There are stable findings of COPD, left mastectomy, and T12 wedge compression.   Electronically Signed   By: David  Martinique   On: 08/01/2014 07:32   EKG: Atrial pacing  FOLLOW UP PLANS AND APPOINTMENTS Allergies  Allergen Reactions  . Ciprofloxacin Rash    Rash in the mouth  . Penicillins Hives and Rash  . Vancomycin Rash    "Red-man"     Medication List    STOP taking these medications        amLODipine 2.5 MG tablet  Commonly known as:   NORVASC     Zoledronic Acid 4 MG/100ML IVPB  Commonly known as:  ZOMETA      TAKE these medications        albuterol 108 (90 BASE) MCG/ACT inhaler  Commonly known as:  PROVENTIL HFA;VENTOLIN HFA  Inhale 2 puffs into the lungs every 6 (six) hours as needed for wheezing or shortness of breath.     ALPRAZolam 0.5 MG tablet  Commonly known as:  XANAX  Take 0.25 mg by mouth 2 (two) times daily as needed for anxiety.     cholecalciferol 1000 UNITS tablet  Commonly known as:  VITAMIN D  Take 1,000 Units by mouth 3 (three) times a week.     diphenhydrAMINE 25 mg capsule  Commonly known as:  BENADRYL  Take 1 capsule (25 mg total) by mouth every 6 (six) hours as needed for itching or allergies.     docusate sodium 100 MG capsule  Commonly known as:  COLACE  Take 1 capsule (100 mg total) by mouth 2 (two) times daily.     hydrocortisone 25 MG suppository  Commonly known as:  ANUSOL-HC  Place 1 suppository (25 mg total) rectally 2 (two) times daily.     letrozole 2.5 MG tablet  Commonly known as:  FEMARA  Take 2.5 mg by mouth daily.     levothyroxine 75 MCG tablet  Commonly known as:  SYNTHROID, LEVOTHROID  Take 75 mg by mouth Daily.     lisinopril 10 MG tablet  Commonly known as:  PRINIVIL,ZESTRIL  TAKE 1 TABLET (10 MG TOTAL) BY MOUTH DAILY.     metoprolol tartrate 25 MG tablet  Commonly known as:  LOPRESSOR  Take 1 tablet (25 mg total) by mouth 2 (two) times daily.     polyethylene glycol packet  Commonly known as:  MIRALAX / GLYCOLAX  Take 17 g by mouth daily.     tetrahydrozoline-zinc 0.05-0.25 % ophthalmic solution  Commonly known as:  VISINE-AC  Place 2 drops into both eyes 3 (three) times daily as needed (red eyes).        Discharge Instructions    Call MD for:  redness, tenderness, or signs of infection (pain, swelling, redness, odor or green/yellow discharge around incision site)    Complete by:  As directed      Diet - low sodium heart healthy    Complete  by:  As directed      Increase activity slowly    Complete by:  As directed      Remove dressing in 24 hours    Complete by:  As directed           Follow-up Information    Follow up with [place].   Why:  Wound check and device check   Contact information:   [address] [place] [phone]       Follow up with Sanda Klein, MD.   Specialty:  Cardiology   Why:  The office will call.   Contact information:   [place] [employer] [place] [address] [phone]       BRING ALL MEDICATIONS WITH YOU TO FOLLOW UP APPOINTMENTS  Time spent with patient to include physician time: 38 min Signed: Rosaria Ferries, PA-C 08/01/2014, 9:09 AM Co-Sign MD

## 2014-08-09 ENCOUNTER — Other Ambulatory Visit (HOSPITAL_BASED_OUTPATIENT_CLINIC_OR_DEPARTMENT_OTHER): Payer: Medicare Other | Admitting: Lab

## 2014-08-09 ENCOUNTER — Encounter: Payer: Self-pay | Admitting: Hematology & Oncology

## 2014-08-09 ENCOUNTER — Ambulatory Visit (HOSPITAL_BASED_OUTPATIENT_CLINIC_OR_DEPARTMENT_OTHER): Payer: Medicare Other | Admitting: Hematology & Oncology

## 2014-08-09 ENCOUNTER — Ambulatory Visit (INDEPENDENT_AMBULATORY_CARE_PROVIDER_SITE_OTHER): Payer: Medicare Other | Admitting: *Deleted

## 2014-08-09 ENCOUNTER — Ambulatory Visit: Payer: Medicare Other

## 2014-08-09 VITALS — BP 152/78 | HR 76 | Temp 97.6°F | Resp 14 | Ht 67.0 in | Wt 164.0 lb

## 2014-08-09 DIAGNOSIS — C50919 Malignant neoplasm of unspecified site of unspecified female breast: Secondary | ICD-10-CM

## 2014-08-09 DIAGNOSIS — R000 Tachycardia, unspecified: Secondary | ICD-10-CM

## 2014-08-09 DIAGNOSIS — Z853 Personal history of malignant neoplasm of breast: Secondary | ICD-10-CM

## 2014-08-09 DIAGNOSIS — C7951 Secondary malignant neoplasm of bone: Secondary | ICD-10-CM

## 2014-08-09 DIAGNOSIS — I495 Sick sinus syndrome: Secondary | ICD-10-CM

## 2014-08-09 LAB — CBC WITH DIFFERENTIAL (CANCER CENTER ONLY)
BASO#: 0 10*3/uL (ref 0.0–0.2)
BASO%: 0.6 % (ref 0.0–2.0)
EOS%: 3.3 % (ref 0.0–7.0)
Eosinophils Absolute: 0.2 10*3/uL (ref 0.0–0.5)
HCT: 34.2 % — ABNORMAL LOW (ref 34.8–46.6)
HGB: 11.4 g/dL — ABNORMAL LOW (ref 11.6–15.9)
LYMPH#: 1.6 10*3/uL (ref 0.9–3.3)
LYMPH%: 29.9 % (ref 14.0–48.0)
MCH: 29.5 pg (ref 26.0–34.0)
MCHC: 33.3 g/dL (ref 32.0–36.0)
MCV: 88 fL (ref 81–101)
MONO#: 0.4 10*3/uL (ref 0.1–0.9)
MONO%: 7.5 % (ref 0.0–13.0)
NEUT#: 3 10*3/uL (ref 1.5–6.5)
NEUT%: 58.7 % (ref 39.6–80.0)
Platelets: 153 10*3/uL (ref 145–400)
RBC: 3.87 10*6/uL (ref 3.70–5.32)
RDW: 13.2 % (ref 11.1–15.7)
WBC: 5.2 10*3/uL (ref 3.9–10.0)

## 2014-08-09 LAB — MDC_IDC_ENUM_SESS_TYPE_INCLINIC
Battery Voltage: 3.11 V
Brady Statistic AP VP Percent: 0.04 %
Brady Statistic AP VS Percent: 71.13 %
Brady Statistic AS VP Percent: 0.01 %
Brady Statistic AS VS Percent: 28.83 %
Brady Statistic RA Percent Paced: 71.16 %
Brady Statistic RV Percent Paced: 0.05 %
Date Time Interrogation Session: 20160303113056
Lead Channel Impedance Value: 418 Ohm
Lead Channel Impedance Value: 475 Ohm
Lead Channel Impedance Value: 532 Ohm
Lead Channel Impedance Value: 532 Ohm
Lead Channel Pacing Threshold Amplitude: 0.5 V
Lead Channel Pacing Threshold Amplitude: 0.75 V
Lead Channel Pacing Threshold Pulse Width: 0.4 ms
Lead Channel Pacing Threshold Pulse Width: 0.4 ms
Lead Channel Sensing Intrinsic Amplitude: 1.375 mV
Lead Channel Sensing Intrinsic Amplitude: 1.75 mV
Lead Channel Sensing Intrinsic Amplitude: 13.25 mV
Lead Channel Sensing Intrinsic Amplitude: 13.875 mV
Lead Channel Setting Pacing Amplitude: 3.5 V
Lead Channel Setting Pacing Amplitude: 3.5 V
Lead Channel Setting Pacing Pulse Width: 0.4 ms
Lead Channel Setting Sensing Sensitivity: 0.9 mV
Zone Setting Detection Interval: 400 ms
Zone Setting Detection Interval: 400 ms

## 2014-08-09 LAB — CMP (CANCER CENTER ONLY)
ALT(SGPT): 15 U/L (ref 10–47)
AST: 22 U/L (ref 11–38)
Albumin: 3.6 g/dL (ref 3.3–5.5)
Alkaline Phosphatase: 45 U/L (ref 26–84)
BUN, Bld: 20 mg/dL (ref 7–22)
CO2: 30 mEq/L (ref 18–33)
Calcium: 9.4 mg/dL (ref 8.0–10.3)
Chloride: 109 mEq/L — ABNORMAL HIGH (ref 98–108)
Creat: 1.2 mg/dl (ref 0.6–1.2)
Glucose, Bld: 108 mg/dL (ref 73–118)
Potassium: 5 mEq/L — ABNORMAL HIGH (ref 3.3–4.7)
Sodium: 146 mEq/L — ABNORMAL HIGH (ref 128–145)
Total Bilirubin: 1.1 mg/dl (ref 0.20–1.60)
Total Protein: 6.5 g/dL (ref 6.4–8.1)

## 2014-08-09 NOTE — Progress Notes (Signed)
No zometa per Dr Marin Olp due to upcoming dental work - root canal.

## 2014-08-09 NOTE — Progress Notes (Signed)
Hematology and Oncology Follow Up Visit  MONIYA MEDITZ [id number] 09/25/1925 81 y.o. 08/09/2014   Principle Diagnosis:  Metastatic breast cancer, bone metastasis only.  Current Therapy:   1. Femara 2.5 mg p.o. daily. 2. Zometa 4 mg IV q.3 months - on hold for dental work     Interim History:  Ms.  Rias is for followup. We last saw her back in December.    She had a pacemaker placed a couple weeks ago. She has atrial fibrillation. She apparently was having issues with tachycardia and bradycardia.  She has had no promises the pacemaker was placed.  She is now having issues with dental work. She apparently has had some tooth root problems. She had a root canal done. She apparently needs to have some roots taken out. As such, we are holding her Zometa. She's complaining of some discomfort in her right anterior pelvis.  She's had no problems with cough. She's had no issues with shortness of breath. There's been no bleeding problems.  She's had no problems with bowels or bladder. She's had no problems with nausea or vomiting. She's had no leg swelling. She's been doing some yard work. She enjoys this.   Her last CA 27-29 in December was 63. This is stable..  Overall, her performance status is [name] Medications:  Current outpatient prescriptions:  .  albuterol (PROVENTIL HFA;VENTOLIN HFA) 108 (90 BASE) MCG/ACT inhaler, Inhale 2 puffs into the lungs every 6 (six) hours as needed for wheezing or shortness of breath., Disp: , Rfl:  .  ALPRAZolam (XANAX) 0.5 MG tablet, Take 0.25 mg by mouth 2 (two) times daily as needed for anxiety. , Disp: , Rfl: 0 .  cholecalciferol (VITAMIN D) 1000 UNITS tablet, Take 1,000 Units by mouth 3 (three) times a week., Disp: , Rfl:  .  diphenhydrAMINE (BENADRYL) 25 mg capsule, Take 1 capsule (25 mg total) by mouth every 6 (six) hours as needed for itching or allergies., Disp: 30 capsule, Rfl: 0 .  docusate sodium (COLACE) 100 MG capsule, Take 1 capsule (100  mg total) by mouth 2 (two) times daily. (Patient taking differently: Take 100 mg by mouth daily as needed. ), Disp: 60 capsule, Rfl: 0 .  hydrocortisone (ANUSOL-HC) 25 MG suppository, Place 1 suppository (25 mg total) rectally 2 (two) times daily. (Patient taking differently: Place 25 mg rectally as needed. ), Disp: 6 suppository, Rfl: 0 .  letrozole (FEMARA) 2.5 MG tablet, Take 2.5 mg by mouth daily., Disp: , Rfl:  .  levothyroxine (SYNTHROID, LEVOTHROID) 75 MCG tablet, Take 75 mg by mouth Daily., Disp: , Rfl:  .  lisinopril (PRINIVIL,ZESTRIL) 10 MG tablet, TAKE 1 TABLET (10 MG TOTAL) BY MOUTH DAILY., Disp: 30 tablet, Rfl: 9 .  metoprolol tartrate (LOPRESSOR) 25 MG tablet, Take 1 tablet (25 mg total) by mouth 2 (two) times daily., Disp: 60 tablet, Rfl: 11 .  polyethylene glycol (MIRALAX / GLYCOLAX) packet, Take 17 g by mouth daily. (Patient taking differently: Take 17 g by mouth daily as needed. ), Disp: 14 each, Rfl: 0 .  tetrahydrozoline-zinc (VISINE-AC) 0.05-0.25 % ophthalmic solution, Place 2 drops into both eyes 3 (three) times daily as needed (red eyes)., Disp: , Rfl:  .  warfarin (COUMADIN) 5 MG tablet, Take 5 mg by mouth daily at 6 PM. , Disp: , Rfl: 4  Allergies:  Allergies  Allergen Reactions  . Ciprofloxacin Rash    Rash in the mouth  . Penicillins Hives and Rash  . Vancomycin Rash    "  Red-man"    Past Medical History, Surgical history, Social history, and Family History were reviewed and updated.  Review of Systems: As above  Physical Exam:  height is 5\' 7"  (1.702 m) and weight is 164 lb (74.39 kg). Her oral temperature is 97.6 F (36.4 C). Her blood pressure is 152/78 and her pulse is 76. Her respiration is 14.   Elderly, thin but well-nourished white female. Head and neck exam shows no ocular or oral lesions. There are no palpable cervical or supraclavicular lymph nodes. Lungs are clear. Cardiac exam regular rate and rhythm with a normal S1 and S2. There are no murmurs,  rubs or bruits.. Abdomen is soft. She has good bowels. There is no fluid wave. There is no palpable liver or spleen tip. Back exam shows some slight kyphosis. No tenderness noted over the spine. She has slight tenderness over the right anterior pelvic region. Extremities shows osteoarthritic changes in her joints. She has some age related muscle atrophy. Neurological exam is nonfocal. Lymph node exam shows no adenopathy.   Lab Results  Component Value Date   WBC 5.2 08/09/2014   HGB 11.4* 08/09/2014   HCT 34.2* 08/09/2014   MCV 88 08/09/2014   PLT 153 08/09/2014     Chemistry      Component Value Date/Time   NA 146* 08/09/2014 1342   NA 142 07/28/2014 0620   K 5.0* 08/09/2014 1342   K 4.5 07/28/2014 0620   CL 109* 08/09/2014 1342   CL 112 07/28/2014 0620   CO2 30 08/09/2014 1342   CO2 25 07/28/2014 0620   BUN 20 08/09/2014 1342   BUN 13 07/28/2014 0620   CREATININE 1.2 08/09/2014 1342   CREATININE 0.93 07/28/2014 0620      Component Value Date/Time   CALCIUM 9.4 08/09/2014 1342   CALCIUM 8.8 07/28/2014 0620   ALKPHOS 45 08/09/2014 1342   ALKPHOS 44 07/27/2014 1247   AST 22 08/09/2014 1342   AST 23 07/27/2014 1247   ALT 15 08/09/2014 1342   ALT 13 07/27/2014 1247   BILITOT 1.10 08/09/2014 1342   BILITOT 1.7* 07/27/2014 1247         Impression and Plan: Ms. Boulos is a 81 year old white female with metastatic breast cancer. Her disease is confined to her bones. She has been stable. She has been stable now for over 5 years. She's done incredibly well. We will continue her on the Femara.   She will be set up with a bone scan. Her last bone scan was done back in November. I will set one up in May.  She is on Coumadin. Her cardiologist is managing this.  If we do find that she has progressive disease, I probably will add Ibrance to her Femara.  I will plan to see her back in about 4 months.  If all is okay with her mouth and dental work we see her back, then we will  restart the Zometa. Volanda Napoleon, MD 3/3/20164:27 PM

## 2014-08-09 NOTE — Progress Notes (Signed)
Wound check appointment. Steri-strips removed. Wound without redness or edema. Incision edges approximated, wound well healed. Normal device function. Thresholds, sensing, and impedances consistent with implant measurements. Device programmed at 3.5V/auto capture programmed on for extra safety margin until 3 month visit. Histogram distribution appropriate for patient and level of activity. No mode switches or high ventricular rates noted. Patient educated about wound care, arm mobility, lifting restrictions.  Rate response programmed on today.   ROV in 3 months with implanting physician.

## 2014-08-10 ENCOUNTER — Telehealth: Payer: Self-pay | Admitting: *Deleted

## 2014-08-10 LAB — VITAMIN D 25 HYDROXY (VIT D DEFICIENCY, FRACTURES): Vit D, 25-Hydroxy: 20 ng/mL — ABNORMAL LOW (ref 30–100)

## 2014-08-10 MED ORDER — VITAMIN D 1000 UNITS PO TABS: 2000.0000 [IU] | ORAL_TABLET | Freq: Every day | ORAL | Status: DC

## 2014-08-10 NOTE — Telephone Encounter (Addendum)
Spoke with patient. She doesn't want to have another prescription called in. She currently take 1000 units three times a week. She wants to just take this every day. Spoke to Dr Marin Olp who stated that patient can take 2000 units per day if she prefers. Spoke with patient and she wants to try this before going to prescription dosing.   ----- Message from Volanda Napoleon, MD sent at 08/10/2014  8:27 AM EST ----- Call --Vit D is very low!!  She needs 50,000units q week.  Please call this in.  pete

## 2014-08-11 ENCOUNTER — Telehealth: Payer: Self-pay | Admitting: Cardiology

## 2014-08-11 NOTE — Telephone Encounter (Signed)
Heart race then slow time, palpitations lasted for some time. She was instructed to take an extra 25 mg lopressor if it happened again, she thought the pacemaker would get rid of the extra beats.  I will have her seen this week as she also thought the pacer beeped.  Hopefully device clinic can see her.

## 2014-08-12 NOTE — Telephone Encounter (Signed)
Thanks

## 2014-08-15 ENCOUNTER — Ambulatory Visit (INDEPENDENT_AMBULATORY_CARE_PROVIDER_SITE_OTHER): Payer: Medicare Other | Admitting: *Deleted

## 2014-08-15 DIAGNOSIS — I495 Sick sinus syndrome: Secondary | ICD-10-CM

## 2014-08-15 LAB — MDC_IDC_ENUM_SESS_TYPE_INCLINIC

## 2014-08-15 NOTE — Progress Notes (Signed)
Pacemaker check in clinic for beeping and palpitations---changed mode switch rate from 150 to 130 due to having palpitations x 30-45 minutes. No episodes recoreded on interrogation. Follow up as planned.

## 2014-08-22 ENCOUNTER — Encounter: Payer: Self-pay | Admitting: Cardiovascular Disease

## 2014-08-31 ENCOUNTER — Encounter: Payer: Self-pay | Admitting: *Deleted

## 2014-09-04 ENCOUNTER — Encounter: Payer: Self-pay | Admitting: Cardiovascular Disease

## 2014-09-12 ENCOUNTER — Other Ambulatory Visit: Payer: Self-pay | Admitting: Family Medicine

## 2014-09-12 ENCOUNTER — Ambulatory Visit
Admission: RE | Admit: 2014-09-12 | Discharge: 2014-09-12 | Disposition: A | Discharge status: Home / Self Care | Payer: Medicare Other | Source: Ambulatory Visit | Attending: Family Medicine | Admitting: Family Medicine

## 2014-09-12 DIAGNOSIS — M549 Dorsalgia, unspecified: Secondary | ICD-10-CM

## 2014-09-24 ENCOUNTER — Encounter: Payer: Self-pay | Admitting: Cardiovascular Disease

## 2014-09-24 ENCOUNTER — Ambulatory Visit (INDEPENDENT_AMBULATORY_CARE_PROVIDER_SITE_OTHER): Payer: Medicare Other | Admitting: Cardiovascular Disease

## 2014-09-24 ENCOUNTER — Ambulatory Visit (INDEPENDENT_AMBULATORY_CARE_PROVIDER_SITE_OTHER): Payer: Medicare Other | Admitting: Pharmacist Clinician (PhC)/ Clinical Pharmacy Specialist

## 2014-09-24 VITALS — BP 144/62 | HR 74 | Ht 68.0 in | Wt 164.4 lb

## 2014-09-24 DIAGNOSIS — Z7901 Long term (current) use of anticoagulants: Secondary | ICD-10-CM

## 2014-09-24 DIAGNOSIS — I4891 Unspecified atrial fibrillation: Secondary | ICD-10-CM

## 2014-09-24 DIAGNOSIS — Z950 Presence of cardiac pacemaker: Secondary | ICD-10-CM

## 2014-09-24 DIAGNOSIS — I495 Sick sinus syndrome: Secondary | ICD-10-CM | POA: Diagnosis not present

## 2014-09-24 LAB — MDC_IDC_ENUM_SESS_TYPE_INCLINIC
Battery Remaining Longevity: 137 mo
Battery Voltage: 3.07 V
Brady Statistic AP VP Percent: 0.04 %
Brady Statistic AP VS Percent: 72.09 %
Brady Statistic AS VP Percent: 0.02 %
Brady Statistic AS VS Percent: 27.85 %
Brady Statistic RA Percent Paced: 72.13 %
Brady Statistic RV Percent Paced: 0.05 %
Date Time Interrogation Session: 20160418181237
Lead Channel Impedance Value: 380 Ohm
Lead Channel Impedance Value: 475 Ohm
Lead Channel Impedance Value: 494 Ohm
Lead Channel Impedance Value: 551 Ohm
Lead Channel Pacing Threshold Amplitude: 0.75 V
Lead Channel Pacing Threshold Amplitude: 0.75 V
Lead Channel Pacing Threshold Pulse Width: 0.4 ms
Lead Channel Pacing Threshold Pulse Width: 0.4 ms
Lead Channel Sensing Intrinsic Amplitude: 13.5 mV
Lead Channel Sensing Intrinsic Amplitude: 13.5 mV
Lead Channel Sensing Intrinsic Amplitude: 3 mV
Lead Channel Sensing Intrinsic Amplitude: 3 mV
Lead Channel Setting Pacing Amplitude: 3.5 V
Lead Channel Setting Pacing Amplitude: 3.5 V
Lead Channel Setting Pacing Pulse Width: 0.4 ms
Lead Channel Setting Sensing Sensitivity: 0.9 mV
Zone Setting Detection Interval: 400 ms
Zone Setting Detection Interval: 450 ms

## 2014-09-24 LAB — POCT INR: INR: 2.3
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
## 2014-09-24 NOTE — Patient Instructions (Signed)
Dr Sallyanne Kuster recommends that you schedule a follow-up appointment in 3 months with a device check (on a Tuesday).

## 2014-10-02 ENCOUNTER — Encounter: Payer: Self-pay | Admitting: Cardiovascular Disease

## 2014-10-26 ENCOUNTER — Other Ambulatory Visit: Payer: Self-pay | Admitting: Hematology & Oncology

## 2014-10-30 ENCOUNTER — Encounter (HOSPITAL_COMMUNITY)
Admission: RE | Admit: 2014-10-30 | Discharge: 2014-10-30 | Disposition: A | Discharge status: Home / Self Care | Payer: Medicare Other | Source: Ambulatory Visit | Attending: Hematology & Oncology | Admitting: Hematology & Oncology

## 2014-10-30 DIAGNOSIS — Z853 Personal history of malignant neoplasm of breast: Secondary | ICD-10-CM | POA: Diagnosis not present

## 2014-10-30 DIAGNOSIS — C7951 Secondary malignant neoplasm of bone: Secondary | ICD-10-CM | POA: Insufficient documentation

## 2014-10-30 DIAGNOSIS — C50919 Malignant neoplasm of unspecified site of unspecified female breast: Secondary | ICD-10-CM

## 2014-10-30 MED ORDER — TECHNETIUM TC 99M MEDRONATE IV KIT
26.4000 | PACK | Freq: Once | INTRAVENOUS | Status: AC | PRN
  Administered 2014-10-30: 26.4 via INTRAVENOUS

## 2014-10-31 ENCOUNTER — Telehealth: Payer: Self-pay | Admitting: *Deleted

## 2014-10-31 NOTE — Telephone Encounter (Signed)
-----   Message from Volanda Napoleon, MD sent at 10/31/2014  7:08 AM EDT ----- Call - bone scan is stable!!  No changes!!!  Have a great Memorial Day!! pete

## 2014-11-01 ENCOUNTER — Ambulatory Visit (INDEPENDENT_AMBULATORY_CARE_PROVIDER_SITE_OTHER): Payer: Medicare Other | Admitting: Pharmacist Clinician (PhC)/ Clinical Pharmacy Specialist

## 2014-11-01 DIAGNOSIS — I4891 Unspecified atrial fibrillation: Secondary | ICD-10-CM

## 2014-11-01 DIAGNOSIS — Z7901 Long term (current) use of anticoagulants: Secondary | ICD-10-CM | POA: Diagnosis not present

## 2014-11-01 LAB — POCT INR: INR: 2.4

## 2014-11-07 ENCOUNTER — Encounter: Payer: Self-pay | Admitting: Hematology & Oncology

## 2014-11-07 ENCOUNTER — Ambulatory Visit: Payer: Medicare Other

## 2014-11-07 ENCOUNTER — Ambulatory Visit (HOSPITAL_BASED_OUTPATIENT_CLINIC_OR_DEPARTMENT_OTHER): Payer: Medicare Other | Admitting: Hematology & Oncology

## 2014-11-07 ENCOUNTER — Other Ambulatory Visit (HOSPITAL_BASED_OUTPATIENT_CLINIC_OR_DEPARTMENT_OTHER): Payer: Medicare Other

## 2014-11-07 VITALS — BP 146/64 | HR 88 | Temp 97.7°F | Resp 18 | Ht 68.0 in | Wt 167.0 lb

## 2014-11-07 DIAGNOSIS — C50919 Malignant neoplasm of unspecified site of unspecified female breast: Secondary | ICD-10-CM

## 2014-11-07 DIAGNOSIS — M818 Other osteoporosis without current pathological fracture: Secondary | ICD-10-CM

## 2014-11-07 DIAGNOSIS — I4891 Unspecified atrial fibrillation: Secondary | ICD-10-CM | POA: Diagnosis not present

## 2014-11-07 DIAGNOSIS — Z170 Estrogen receptor positive status [ER+]: Secondary | ICD-10-CM

## 2014-11-07 DIAGNOSIS — T386X5A Adverse effect of antigonadotrophins, antiestrogens, antiandrogens, not elsewhere classified, initial encounter: Secondary | ICD-10-CM

## 2014-11-07 DIAGNOSIS — Z79811 Long term (current) use of aromatase inhibitors: Secondary | ICD-10-CM

## 2014-11-07 DIAGNOSIS — C50911 Malignant neoplasm of unspecified site of right female breast: Secondary | ICD-10-CM | POA: Diagnosis not present

## 2014-11-07 DIAGNOSIS — C7951 Secondary malignant neoplasm of bone: Secondary | ICD-10-CM

## 2014-11-07 LAB — CMP (CANCER CENTER ONLY)
ALT(SGPT): 18 U/L (ref 10–47)
AST: 23 U/L (ref 11–38)
Albumin: 3.7 g/dL (ref 3.3–5.5)
Alkaline Phosphatase: 58 U/L (ref 26–84)
BUN, Bld: 16 mg/dL (ref 7–22)
CO2: 26 mEq/L (ref 18–33)
Calcium: 8.9 mg/dL (ref 8.0–10.3)
Chloride: 107 mEq/L (ref 98–108)
Creat: 1.2 mg/dl (ref 0.6–1.2)
Glucose, Bld: 94 mg/dL (ref 73–118)
Potassium: 4.1 mEq/L (ref 3.3–4.7)
Sodium: 144 mEq/L (ref 128–145)
Total Bilirubin: 1.3 mg/dl (ref 0.20–1.60)
Total Protein: 6.6 g/dL (ref 6.4–8.1)

## 2014-11-07 LAB — CBC WITH DIFFERENTIAL (CANCER CENTER ONLY)
BASO#: 0 10*3/uL (ref 0.0–0.2)
BASO%: 1 % (ref 0.0–2.0)
EOS%: 6.5 % (ref 0.0–7.0)
Eosinophils Absolute: 0.3 10*3/uL (ref 0.0–0.5)
HCT: 35.7 % (ref 34.8–46.6)
HGB: 11.9 g/dL (ref 11.6–15.9)
LYMPH#: 1.2 10*3/uL (ref 0.9–3.3)
LYMPH%: 29.8 % (ref 14.0–48.0)
MCH: 28.7 pg (ref 26.0–34.0)
MCHC: 33.3 g/dL (ref 32.0–36.0)
MCV: 86 fL (ref 81–101)
MONO#: 0.4 10*3/uL (ref 0.1–0.9)
MONO%: 9.6 % (ref 0.0–13.0)
NEUT#: 2.1 10*3/uL (ref 1.5–6.5)
NEUT%: 53.1 % (ref 39.6–80.0)
Platelets: 182 10*3/uL (ref 145–400)
RBC: 4.14 10*6/uL (ref 3.70–5.32)
RDW: 13.9 % (ref 11.1–15.7)
WBC: 3.9 10*3/uL (ref 3.9–10.0)

## 2014-11-07 NOTE — Progress Notes (Signed)
Hematology and Oncology Follow Up Visit  Amy White [id number] May 20, 1926 81 y.o. 11/07/2014   Principle Diagnosis:  Metastatic breast cancer, bone metastasis only.  Current Therapy:   1. Femara 2.5 mg p.o. daily. 2. Zometa 4 mg IV q.3 months - on hold for dental work     Interim History:  Ms.  White is for followup. We last saw her back in March.  She still feels tired. She had her pacemaker placed I think back in December. This is still doing okay. She has the atrial fibrillation. She had a pacemaker placed a couple weeks ago. She has atrial fibrillation. She is on Coumadin for this.  She had a bone scan done in the past week or so. This shows stable activity in her bones. She has uptake at T12. Also noted is uptake in the right iliac region. No new areas are noted.  She's had no problems with bowels or bladder.  She is having some issues with her teeth. It sounds like she will need to have some dental work done. She has been seen and endodontist. She will probably need to have a root canal done.  Her last CA 27-29 in December was 19. This is stable..  Overall, her performance status is [name] Medications:  Current outpatient prescriptions:  .  albuterol (PROVENTIL HFA;VENTOLIN HFA) 108 (90 BASE) MCG/ACT inhaler, Inhale 2 puffs into the lungs every 6 (six) hours as needed for wheezing or shortness of breath., Disp: , Rfl:  .  ALPRAZolam (XANAX) 0.5 MG tablet, Take 0.25 mg by mouth 2 (two) times daily as needed for anxiety. , Disp: , Rfl: 0 .  cholecalciferol (VITAMIN D) 1000 UNITS tablet, Take 2 tablets (2,000 Units total) by mouth daily., Disp: , Rfl:  .  diphenhydrAMINE (BENADRYL) 25 mg capsule, Take 1 capsule (25 mg total) by mouth every 6 (six) hours as needed for itching or allergies., Disp: 30 capsule, Rfl: 0 .  letrozole (FEMARA) 2.5 MG tablet, TAKE 1 TABLET BY MOUTH EVERY DAY, Disp: 30 tablet, Rfl: 2 .  levothyroxine (SYNTHROID, LEVOTHROID) 75 MCG tablet, Take 75 mg by  mouth Daily., Disp: , Rfl:  .  lisinopril (PRINIVIL,ZESTRIL) 10 MG tablet, TAKE 1 TABLET (10 MG TOTAL) BY MOUTH DAILY., Disp: 30 tablet, Rfl: 9 .  metoprolol tartrate (LOPRESSOR) 25 MG tablet, Take 1 tablet (25 mg total) by mouth 2 (two) times daily., Disp: 60 tablet, Rfl: 11 .  tetrahydrozoline-zinc (VISINE-AC) 0.05-0.25 % ophthalmic solution, Place 2 drops into both eyes 3 (three) times daily as needed (red eyes)., Disp: , Rfl:  .  warfarin (COUMADIN) 5 MG tablet, Take 5 mg by mouth daily at 6 PM. , Disp: , Rfl: 4  Allergies:  Allergies  Allergen Reactions  . Crestor [Rosuvastatin]   . Ciprofloxacin Rash    Rash in the mouth  . Penicillins Hives and Rash  . Vancomycin Rash    "Red-man"    Past Medical History, Surgical history, Social history, and Family History were reviewed and updated.  Review of Systems: As above  Physical Exam:  height is 5\' 8"  (1.727 m) and weight is 167 lb (75.751 kg). Her oral temperature is 97.7 F (36.5 C). Her blood pressure is 146/64 and her pulse is 88. Her respiration is 18.   Elderly, thin but well-nourished white female. Head and neck exam shows no ocular or oral lesions. There are no palpable cervical or supraclavicular lymph nodes. Lungs are clear. Cardiac exam regular rate and rhythm  with a normal S1 and S2. There are no murmurs, rubs or bruits.. Abdomen is soft. She has good bowels. There is no fluid wave. There is no palpable liver or spleen tip. Back exam shows some slight kyphosis. No tenderness noted over the spine. She has slight tenderness over the right anterior pelvic region. Extremities shows osteoarthritic changes in her joints. She has some age related muscle atrophy. Neurological exam is nonfocal. Lymph node exam shows no adenopathy.   Lab Results  Component Value Date   WBC 3.9 11/07/2014   HGB 11.9 11/07/2014   HCT 35.7 11/07/2014   MCV 86 11/07/2014   PLT 182 11/07/2014     Chemistry      Component Value Date/Time   NA 144  11/07/2014 1321   NA 142 07/28/2014 0620   K 4.1 11/07/2014 1321   K 4.5 07/28/2014 0620   CL 107 11/07/2014 1321   CL 112 07/28/2014 0620   CO2 26 11/07/2014 1321   CO2 25 07/28/2014 0620   BUN 16 11/07/2014 1321   BUN 13 07/28/2014 0620   CREATININE 1.2 11/07/2014 1321   CREATININE 0.93 07/28/2014 0620      Component Value Date/Time   CALCIUM 8.9 11/07/2014 1321   CALCIUM 8.8 07/28/2014 0620   ALKPHOS 58 11/07/2014 1321   ALKPHOS 44 07/27/2014 1247   AST 23 11/07/2014 1321   AST 23 07/27/2014 1247   ALT 18 11/07/2014 1321   ALT 13 07/27/2014 1247   BILITOT 1.30 11/07/2014 1321   BILITOT 1.7* 07/27/2014 1247         Impression and Plan: Amy White is a 81 year old white female with metastatic breast cancer. Her disease is confined to her bones. She has been stable. She has been stable now for over 8 years. She's done incredibly well. We will continue her on the Femara.   I think that we can hold off on the Zometa right now. Whatever tooth issue that she has, easily taken care of. It sounds like she is having a root canal done area and I think that with holding the Zometa will not be a problem for her.  We can always consider Delton See if we needed to.  I will plan to get her back in 3 months. I will like to hope that she will have the dental work that she is having done in the next month or so.   Volanda Napoleon, MD 6/1/20165:25 PM

## 2014-11-08 LAB — CANCER ANTIGEN 27.29: CA 27.29: 43 U/mL — ABNORMAL HIGH (ref 0–39)

## 2014-11-22 ENCOUNTER — Telehealth: Payer: Self-pay | Admitting: Cardiovascular Disease

## 2014-11-22 MED ORDER — METOPROLOL TARTRATE 50 MG PO TABS: 50.0000 mg | ORAL_TABLET | Freq: Two times a day (BID) | ORAL | Status: DC

## 2014-11-22 NOTE — Telephone Encounter (Signed)
Returned call to patient explained Dr.Croitoru's recommendations.Advised to increase metoprolol to 50 mg twice a day.Advised to monitor B/P and if low call back Dr.Croitoru will decrease lisinopril.Advised to call back if continues to have palpitations.

## 2014-11-22 NOTE — Telephone Encounter (Signed)
Returned call to patient she stated she had palpitations last night that lasted appox 2 hours.Stated she thought her pacemaker was suppose to stop palpitations.Stated she took a xanax and that seem to help.Stated she wanted to ask Dr.Croitoru why pacemaker did not help.Also is it ok to take a extra metoprolol 25 mg if needed.Message sent to Dr.Croitoru for advice.

## 2014-11-22 NOTE — Telephone Encounter (Signed)
Please call,pt says she had palpitations,heart skipping a lot.This was last night,this went on for about 2 hours. Please call to advise.

## 2014-11-22 NOTE — Telephone Encounter (Signed)
Unfortunately, the pacemaker cannot stop the rapid rhythms, which caused the palpitations. The pacemaker was implanted to allow [place] to give her more medications for the palpitations, without causing her heartbeat to be excessively slow. Please increase metoprolol to 50 mg twice a day. If the palpitations happen frequently, we will further increase the standing dose of metoprolol. If the current recommended change in metoprolol leads to low blood pressure, we can reduce the dose of lisinopril

## 2014-12-13 ENCOUNTER — Ambulatory Visit (INDEPENDENT_AMBULATORY_CARE_PROVIDER_SITE_OTHER): Payer: Medicare Other | Admitting: Pharmacist Clinician (PhC)/ Clinical Pharmacy Specialist

## 2014-12-13 DIAGNOSIS — Z7901 Long term (current) use of anticoagulants: Secondary | ICD-10-CM

## 2014-12-13 DIAGNOSIS — I4891 Unspecified atrial fibrillation: Secondary | ICD-10-CM | POA: Diagnosis not present

## 2014-12-13 LAB — POCT INR: INR: 1.7

## 2015-01-08 ENCOUNTER — Ambulatory Visit (INDEPENDENT_AMBULATORY_CARE_PROVIDER_SITE_OTHER): Payer: Medicare Other | Admitting: *Deleted

## 2015-01-08 ENCOUNTER — Ambulatory Visit (INDEPENDENT_AMBULATORY_CARE_PROVIDER_SITE_OTHER): Payer: Medicare Other | Admitting: Cardiovascular Disease

## 2015-01-08 ENCOUNTER — Encounter: Payer: Self-pay | Admitting: Cardiovascular Disease

## 2015-01-08 VITALS — BP 124/72 | HR 63 | Ht 68.0 in | Wt 170.2 lb

## 2015-01-08 DIAGNOSIS — I4891 Unspecified atrial fibrillation: Secondary | ICD-10-CM | POA: Diagnosis not present

## 2015-01-08 DIAGNOSIS — T50905A Adverse effect of unspecified drugs, medicaments and biological substances, initial encounter: Secondary | ICD-10-CM | POA: Diagnosis not present

## 2015-01-08 DIAGNOSIS — I495 Sick sinus syndrome: Secondary | ICD-10-CM

## 2015-01-08 DIAGNOSIS — Z950 Presence of cardiac pacemaker: Secondary | ICD-10-CM

## 2015-01-08 DIAGNOSIS — Z7901 Long term (current) use of anticoagulants: Secondary | ICD-10-CM

## 2015-01-08 DIAGNOSIS — R001 Bradycardia, unspecified: Secondary | ICD-10-CM

## 2015-01-08 LAB — CUP PACEART INCLINIC DEVICE CHECK
Battery Remaining Longevity: 131 mo
Battery Voltage: 3.04 V
Brady Statistic AP VP Percent: 0.03 %
Brady Statistic AP VS Percent: 79.55 %
Brady Statistic AS VP Percent: 0.02 %
Brady Statistic AS VS Percent: 20.4 %
Brady Statistic RA Percent Paced: 79.58 %
Brady Statistic RV Percent Paced: 0.05 %
Date Time Interrogation Session: 20160802155352
Lead Channel Impedance Value: 380 Ohm
Lead Channel Impedance Value: 494 Ohm
Lead Channel Impedance Value: 494 Ohm
Lead Channel Impedance Value: 551 Ohm
Lead Channel Pacing Threshold Amplitude: 0.625 V
Lead Channel Pacing Threshold Amplitude: 0.75 V
Lead Channel Pacing Threshold Pulse Width: 0.4 ms
Lead Channel Pacing Threshold Pulse Width: 0.4 ms
Lead Channel Sensing Intrinsic Amplitude: 1.75 mV
Lead Channel Sensing Intrinsic Amplitude: 13 mV
Lead Channel Sensing Intrinsic Amplitude: 14.375 mV
Lead Channel Sensing Intrinsic Amplitude: 3.25 mV
Lead Channel Setting Pacing Amplitude: 1.5 V
Lead Channel Setting Pacing Amplitude: 2 V
Lead Channel Setting Pacing Pulse Width: 0.4 ms
Lead Channel Setting Sensing Sensitivity: 0.9 mV
Zone Setting Detection Interval: 400 ms
Zone Setting Detection Interval: 450 ms

## 2015-01-08 LAB — POCT INR: INR: 1.8

## 2015-01-08 MED ORDER — DILTIAZEM HCL ER COATED BEADS 240 MG PO CP24: 240.0000 mg | ORAL_CAPSULE | Freq: Every day | ORAL | Status: DC

## 2015-01-08 NOTE — Patient Instructions (Addendum)
Your physician wants you to follow-up in: 6 Months. You will receive a reminder letter in the mail two months in advance. If you don't receive a letter, please call our office to schedule the follow-up appointment.  Remote monitoring is used to monitor your Pacemaker of ICD from home. This monitoring reduces the number of office visits required to check your device to one time per year. It allows [place] to keep an eye on the functioning of your device to ensure it is working properly. You are scheduled for a device check from home on 04/09/2015. You may send your transmission at any time that day. If you have a wireless device, the transmission will be sent automatically. After your physician reviews your transmission, you will receive a postcard with your next transmission date.  Your physician has recommended you make the following change in your medication: Take Metoprolol 25 mg twice a day for 2 weeks,then 25 mg ONCE a day for 2 weeks then STOP. START Diltiazem ER 240 mg daily.
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
## 2015-01-24 ENCOUNTER — Ambulatory Visit (INDEPENDENT_AMBULATORY_CARE_PROVIDER_SITE_OTHER): Payer: Medicare Other | Admitting: Pharmacist Clinician (PhC)/ Clinical Pharmacy Specialist

## 2015-01-24 DIAGNOSIS — Z7901 Long term (current) use of anticoagulants: Secondary | ICD-10-CM | POA: Diagnosis not present

## 2015-01-24 DIAGNOSIS — I4891 Unspecified atrial fibrillation: Secondary | ICD-10-CM

## 2015-01-24 LAB — POCT INR: INR: 2.5

## 2015-02-04 ENCOUNTER — Other Ambulatory Visit: Payer: Self-pay | Admitting: Hematology & Oncology

## 2015-02-13 ENCOUNTER — Other Ambulatory Visit (HOSPITAL_BASED_OUTPATIENT_CLINIC_OR_DEPARTMENT_OTHER): Payer: Medicare Other

## 2015-02-13 ENCOUNTER — Ambulatory Visit: Payer: Medicare Other

## 2015-02-13 ENCOUNTER — Ambulatory Visit (HOSPITAL_BASED_OUTPATIENT_CLINIC_OR_DEPARTMENT_OTHER): Payer: Medicare Other | Admitting: Hematology & Oncology

## 2015-02-13 ENCOUNTER — Encounter: Payer: Self-pay | Admitting: Hematology & Oncology

## 2015-02-13 VITALS — BP 129/57 | HR 80 | Temp 97.4°F | Resp 18 | Ht 68.0 in | Wt 169.0 lb

## 2015-02-13 DIAGNOSIS — C7951 Secondary malignant neoplasm of bone: Secondary | ICD-10-CM | POA: Diagnosis not present

## 2015-02-13 DIAGNOSIS — C50912 Malignant neoplasm of unspecified site of left female breast: Secondary | ICD-10-CM | POA: Diagnosis not present

## 2015-02-13 DIAGNOSIS — T386X5A Adverse effect of antigonadotrophins, antiestrogens, antiandrogens, not elsewhere classified, initial encounter: Secondary | ICD-10-CM

## 2015-02-13 DIAGNOSIS — M818 Other osteoporosis without current pathological fracture: Secondary | ICD-10-CM

## 2015-02-13 DIAGNOSIS — C50911 Malignant neoplasm of unspecified site of right female breast: Secondary | ICD-10-CM

## 2015-02-13 LAB — CBC WITH DIFFERENTIAL (CANCER CENTER ONLY)
BASO#: 0.1 10*3/uL (ref 0.0–0.2)
BASO%: 1.2 % (ref 0.0–2.0)
EOS%: 5.2 % (ref 0.0–7.0)
Eosinophils Absolute: 0.2 10*3/uL (ref 0.0–0.5)
HCT: 36.7 % (ref 34.8–46.6)
HGB: 12.1 g/dL (ref 11.6–15.9)
LYMPH#: 1.1 10*3/uL (ref 0.9–3.3)
LYMPH%: 26.2 % (ref 14.0–48.0)
MCH: 28.4 pg (ref 26.0–34.0)
MCHC: 33 g/dL (ref 32.0–36.0)
MCV: 86 fL (ref 81–101)
MONO#: 0.4 10*3/uL (ref 0.1–0.9)
MONO%: 9.9 % (ref 0.0–13.0)
NEUT#: 2.4 10*3/uL (ref 1.5–6.5)
NEUT%: 57.5 % (ref 39.6–80.0)
Platelets: 196 10*3/uL (ref 145–400)
RBC: 4.26 10*6/uL (ref 3.70–5.32)
RDW: 14.4 % (ref 11.1–15.7)
WBC: 4.2 10*3/uL (ref 3.9–10.0)

## 2015-02-13 LAB — CMP (CANCER CENTER ONLY)
ALT(SGPT): 23 U/L (ref 10–47)
AST: 26 U/L (ref 11–38)
Albumin: 3.7 g/dL (ref 3.3–5.5)
Alkaline Phosphatase: 54 U/L (ref 26–84)
BUN, Bld: 27 mg/dL — ABNORMAL HIGH (ref 7–22)
CO2: 26 mEq/L (ref 18–33)
Calcium: 9.4 mg/dL (ref 8.0–10.3)
Chloride: 107 mEq/L (ref 98–108)
Creat: 1.4 mg/dl — ABNORMAL HIGH (ref 0.6–1.2)
Glucose, Bld: 92 mg/dL (ref 73–118)
Potassium: 4.5 mEq/L (ref 3.3–4.7)
Sodium: 139 mEq/L (ref 128–145)
Total Bilirubin: 1.1 mg/dl (ref 0.20–1.60)
Total Protein: 6.9 g/dL (ref 6.4–8.1)

## 2015-02-13 NOTE — Progress Notes (Signed)
No treatment given today per dr. Marin Olp

## 2015-02-13 NOTE — Progress Notes (Signed)
Hematology and Oncology Follow Up Visit  Amy White [id number] 19-Nov-1925 81 y.o. 02/13/2015   Principle Diagnosis:  Metastatic breast cancer, bone metastasis only.  Current Therapy:   1. Femara 2.5 mg p.o. daily. 2. Delton See 120mg  sq q 3 month     Interim History:  Ms.  White is for followup. We last saw her back in June. She is feeling okay. She had a pretty decent summer.  Her pacemaker is doing okay. She continues on Coumadin. So far, she's had no issues with respect to cardiac arrhythmia.  She did undergo another bone scan. This was done in May. This showed stable bone metastases. Everything seemed to be unchanged.  Her last CA 27.29 which was done in June showed a level of 43 which is stable.  She's not had any bone issues. She did have some tooth issues. She had a root canal done for 2 lower front teeth. She is going to have a partial denture placed. She is not going to have an extractions.  We have held her Zometa.  I think we may have to consider Xgeva on her.  Overall, her performance status is [name] Medications:  Current outpatient prescriptions:  .  albuterol (PROVENTIL HFA;VENTOLIN HFA) 108 (90 BASE) MCG/ACT inhaler, Inhale 2 puffs into the lungs every 6 (six) hours as needed for wheezing or shortness of breath., Disp: , Rfl:  .  ALPRAZolam (XANAX) 0.5 MG tablet, Take 0.25 mg by mouth 2 (two) times daily as needed for anxiety. , Disp: , Rfl: 0 .  cholecalciferol (VITAMIN D) 1000 UNITS tablet, Take 2 tablets (2,000 Units total) by mouth daily., Disp: , Rfl:  .  diclofenac sodium (VOLTAREN) 1 % GEL, Apply 1 application topically daily., Disp: , Rfl: 1 .  diltiazem (CARDIZEM CD) 240 MG 24 hr capsule, Take 1 capsule (240 mg total) by mouth daily., Disp: 30 capsule, Rfl: 6 .  letrozole (FEMARA) 2.5 MG tablet, TAKE 1 TABLET BY MOUTH EVERY DAY, Disp: 30 tablet, Rfl: 0 .  levothyroxine (SYNTHROID, LEVOTHROID) 75 MCG tablet, Take 75 mg by mouth Daily., Disp: , Rfl:  .   lisinopril (PRINIVIL,ZESTRIL) 10 MG tablet, TAKE 1 TABLET (10 MG TOTAL) BY MOUTH DAILY., Disp: 30 tablet, Rfl: 9 .  tetrahydrozoline-zinc (VISINE-AC) 0.05-0.25 % ophthalmic solution, Place 2 drops into both eyes 3 (three) times daily as needed (red eyes)., Disp: , Rfl:  .  warfarin (COUMADIN) 5 MG tablet, Take 5 mg by mouth daily at 6 PM. , Disp: , Rfl: 4  Allergies:  Allergies  Allergen Reactions  . Crestor [Rosuvastatin]   . Ciprofloxacin Rash    Rash in the mouth  . Penicillins Hives and Rash  . Vancomycin Rash    "Red-man"    Past Medical History, Surgical history, Social history, and Family History were reviewed and updated.  Review of Systems: As above  Physical Exam:  height is 5\' 8"  (1.727 m) and weight is 169 lb (76.658 kg). Her oral temperature is 97.4 F (36.3 C). Her blood pressure is 129/57 and her pulse is 80. Her respiration is 18.   Elderly, thin but well-nourished white female. Head and neck exam shows no ocular or oral lesions. There are no palpable cervical or supraclavicular lymph nodes. Lungs are clear. Cardiac exam regular rate and rhythm with a normal S1 and S2. There are no murmurs, rubs or bruits.. Abdomen is soft. She has good bowels. There is no fluid wave. There is no palpable liver or spleen  tip. Back exam shows some slight kyphosis. No tenderness noted over the spine. She has slight tenderness over the right anterior pelvic region. Extremities shows osteoarthritic changes in her joints. She has some age related muscle atrophy. Neurological exam is nonfocal. Lymph node exam shows no adenopathy.   Lab Results  Component Value Date   WBC 4.2 02/13/2015   HGB 12.1 02/13/2015   HCT 36.7 02/13/2015   MCV 86 02/13/2015   PLT 196 02/13/2015     Chemistry      Component Value Date/Time   NA 139 02/13/2015 1144   NA 142 07/28/2014 0620   K 4.5 02/13/2015 1144   K 4.5 07/28/2014 0620   CL 107 02/13/2015 1144   CL 112 07/28/2014 0620   CO2 26 02/13/2015  1144   CO2 25 07/28/2014 0620   BUN 27* 02/13/2015 1144   BUN 13 07/28/2014 0620   CREATININE 1.4* 02/13/2015 1144   CREATININE 0.93 07/28/2014 0620      Component Value Date/Time   CALCIUM 9.4 02/13/2015 1144   CALCIUM 8.8 07/28/2014 0620   ALKPHOS 54 02/13/2015 1144   ALKPHOS 44 07/27/2014 1247   AST 26 02/13/2015 1144   AST 23 07/27/2014 1247   ALT 23 02/13/2015 1144   ALT 13 07/27/2014 1247   BILITOT 1.10 02/13/2015 1144   BILITOT 1.7* 07/27/2014 1247         Impression and Plan: Amy White is a 81 year old white female with metastatic breast cancer. Her disease is confined to her bones. She has been stable. She has been stable now for about 9 years. She's done incredibly well. We will continue her on the Femara.  Her renal function is up a little bit. I think this might be a reflection of her medications. Regardless, I don't think that we can use Zometa  I think that we can use Xgeva. I think this would be reasonable.  I will I to get her back in 6 weeks. I want to make sure that we follow up with her kidney function.   Volanda Napoleon, MD 9/7/201612:44 PM

## 2015-02-14 ENCOUNTER — Telehealth: Payer: Self-pay | Admitting: Cardiovascular Disease

## 2015-02-14 LAB — CANCER ANTIGEN 27.29: CA 27.29: 65 U/mL — ABNORMAL HIGH (ref 0–39)

## 2015-02-14 LAB — VITAMIN D 25 HYDROXY (VIT D DEFICIENCY, FRACTURES): Vit D, 25-Hydroxy: 23 ng/mL — ABNORMAL LOW (ref 30–100)

## 2015-02-14 NOTE — Telephone Encounter (Signed)
Patient said they would not do her chemotherapy yesterday because of her kidney function  Concerned her kidney function could be related to Lisinopril or Levothyroxine.

## 2015-02-14 NOTE — Telephone Encounter (Signed)
She went for Chemo yesterday,but they would not do it. They said her kidneys were not functioning properly. She wonder if the Lisinopril is effecting her kidneys?

## 2015-02-14 NOTE — Telephone Encounter (Signed)
She has been on lisinopril 10 mg since December.  Usually only see transient increases in SCr with ACEI.  Will forward to Dr. Loletha Grayer for review

## 2015-02-15 ENCOUNTER — Telehealth: Payer: Self-pay | Admitting: *Deleted

## 2015-02-15 DIAGNOSIS — E559 Vitamin D deficiency, unspecified: Secondary | ICD-10-CM

## 2015-02-15 MED ORDER — ERGOCALCIFEROL 1.25 MG (50000 UT) PO CAPS: 50000.0000 [IU] | ORAL_CAPSULE | ORAL | Status: DC

## 2015-02-15 NOTE — Telephone Encounter (Signed)
The BUN and creatinine pattern suggest that the most likely reason for her abnormal renal tests was dehydration rather than lisinopril. Please increase intake of fluids and recheck early next week.

## 2015-02-15 NOTE — Telephone Encounter (Addendum)
Patient aware of new prescription. Will start taking.  ----- Message from Volanda Napoleon, MD sent at 02/15/2015  6:41 AM EDT ----- Call - is she on vit D??  Her level is low.  She needs to be taking 50000 units q week.  pete

## 2015-02-15 NOTE — Telephone Encounter (Signed)
Called patient LVMTCB.

## 2015-02-15 NOTE — Telephone Encounter (Signed)
Forward to triage to call patient

## 2015-02-20 ENCOUNTER — Ambulatory Visit (INDEPENDENT_AMBULATORY_CARE_PROVIDER_SITE_OTHER): Payer: Medicare Other | Admitting: Pharmacist Clinician (PhC)/ Clinical Pharmacy Specialist

## 2015-02-20 DIAGNOSIS — Z7901 Long term (current) use of anticoagulants: Secondary | ICD-10-CM

## 2015-02-20 DIAGNOSIS — I4891 Unspecified atrial fibrillation: Secondary | ICD-10-CM

## 2015-02-20 LAB — POCT INR: INR: 2.4

## 2015-03-04 ENCOUNTER — Other Ambulatory Visit: Payer: Self-pay | Admitting: Hematology & Oncology

## 2015-03-07 ENCOUNTER — Telehealth: Payer: Self-pay | Admitting: Cardiovascular Disease

## 2015-03-07 NOTE — Telephone Encounter (Signed)
Please prescribe furosemide 40 mg daily for 3-5 days (until 10 lb of weight are gone) and then stop. Please increase intake of potassium rich foods. Call back on Monday with symptoms and weight. If no improvement needs office visit. Ask her to try to identify any increase in sodium rich foods in her diet and remove those ingredients. Rx for 30 tabs, to be taken prn as directed.

## 2015-03-07 NOTE — Telephone Encounter (Signed)
3 weeks ago feet and ankles started to swell  Gained 10 lbs over 3 weeks  No chest pain, no shortness of breath  Weight today 173 lbs this morning  BP 120/58 P 68  Oncologist advised her to notify her cardiologist

## 2015-03-07 NOTE — Telephone Encounter (Signed)
Please call,her feet and legs are swelling really bad. She have gained 10lbs in one week.

## 2015-03-08 MED ORDER — FUROSEMIDE 40 MG PO TABS: ORAL_TABLET | ORAL | Status: DC

## 2015-03-08 NOTE — Telephone Encounter (Signed)
Furosemide 40 mg ordered  Called patient LMTCB

## 2015-03-08 NOTE — Addendum Note (Signed)
Addended by: Carilyn Goodpasture on: 03/08/2015 08:43 AM   Modules accepted: Orders

## 2015-03-08 NOTE — Telephone Encounter (Signed)
Patient returned call  Explained use of Lasix in removing the excessive fluid   Instructed patient on eating potassium rich foods  Instructed patient to stay away from processed foods to reduce sodium.  Will start Lasix today for 3-4 days.  Weigh herself at home daily and call back on Monday with report of her swelling and weight loss

## 2015-03-20 ENCOUNTER — Ambulatory Visit (INDEPENDENT_AMBULATORY_CARE_PROVIDER_SITE_OTHER): Payer: Medicare Other | Admitting: Pharmacist

## 2015-03-20 DIAGNOSIS — Z7901 Long term (current) use of anticoagulants: Secondary | ICD-10-CM | POA: Diagnosis not present

## 2015-03-20 DIAGNOSIS — I4891 Unspecified atrial fibrillation: Secondary | ICD-10-CM | POA: Diagnosis not present

## 2015-03-20 LAB — POCT INR: INR: 2.4

## 2015-03-27 ENCOUNTER — Other Ambulatory Visit: Payer: Self-pay | Admitting: Pharmacist Clinician (PhC)/ Clinical Pharmacy Specialist

## 2015-03-29 ENCOUNTER — Ambulatory Visit (HOSPITAL_BASED_OUTPATIENT_CLINIC_OR_DEPARTMENT_OTHER): Payer: Medicare Other | Admitting: Hematology & Oncology

## 2015-03-29 ENCOUNTER — Ambulatory Visit (HOSPITAL_BASED_OUTPATIENT_CLINIC_OR_DEPARTMENT_OTHER): Payer: Medicare Other

## 2015-03-29 ENCOUNTER — Other Ambulatory Visit (HOSPITAL_BASED_OUTPATIENT_CLINIC_OR_DEPARTMENT_OTHER): Payer: Medicare Other

## 2015-03-29 ENCOUNTER — Encounter: Payer: Self-pay | Admitting: Hematology & Oncology

## 2015-03-29 VITALS — BP 148/61 | HR 80 | Temp 97.6°F | Resp 16 | Ht 68.0 in | Wt 176.0 lb

## 2015-03-29 DIAGNOSIS — I4819 Other persistent atrial fibrillation: Secondary | ICD-10-CM

## 2015-03-29 DIAGNOSIS — C50912 Malignant neoplasm of unspecified site of left female breast: Secondary | ICD-10-CM | POA: Diagnosis not present

## 2015-03-29 DIAGNOSIS — C7951 Secondary malignant neoplasm of bone: Principal | ICD-10-CM

## 2015-03-29 DIAGNOSIS — D508 Other iron deficiency anemias: Secondary | ICD-10-CM

## 2015-03-29 DIAGNOSIS — D631 Anemia in chronic kidney disease: Secondary | ICD-10-CM

## 2015-03-29 DIAGNOSIS — N183 Chronic kidney disease, stage 3 (moderate): Secondary | ICD-10-CM | POA: Diagnosis not present

## 2015-03-29 DIAGNOSIS — C50911 Malignant neoplasm of unspecified site of right female breast: Secondary | ICD-10-CM

## 2015-03-29 LAB — COMPREHENSIVE METABOLIC PANEL (CC13)
ALT: 11 U/L (ref 0–55)
AST: 17 U/L (ref 5–34)
Albumin: 3.8 g/dL (ref 3.5–5.0)
Alkaline Phosphatase: 57 U/L (ref 40–150)
Anion Gap: 7 mEq/L (ref 3–11)
BUN: 25.2 mg/dL (ref 7.0–26.0)
CO2: 23 mEq/L (ref 22–29)
Calcium: 9.4 mg/dL (ref 8.4–10.4)
Chloride: 114 mEq/L — ABNORMAL HIGH (ref 98–109)
Creatinine: 1.1 mg/dL (ref 0.6–1.1)
EGFR: 43 mL/min/{1.73_m2} — ABNORMAL LOW (ref >90)
Glucose: 87 mg/dl (ref 70–140)
Potassium: 4.6 mEq/L (ref 3.5–5.1)
Sodium: 144 mEq/L (ref 136–145)
Total Bilirubin: 0.75 mg/dL (ref 0.20–1.20)
Total Protein: 6.5 g/dL (ref 6.4–8.3)

## 2015-03-29 LAB — CBC WITH DIFFERENTIAL (CANCER CENTER ONLY)
BASO#: 0.1 10*3/uL (ref 0.0–0.2)
BASO%: 1.3 % (ref 0.0–2.0)
EOS%: 6.9 % (ref 0.0–7.0)
Eosinophils Absolute: 0.3 10*3/uL (ref 0.0–0.5)
HCT: 34.1 % — ABNORMAL LOW (ref 34.8–46.6)
HGB: 11.2 g/dL — ABNORMAL LOW (ref 11.6–15.9)
LYMPH#: 1.1 10*3/uL (ref 0.9–3.3)
LYMPH%: 29.6 % (ref 14.0–48.0)
MCH: 29.1 pg (ref 26.0–34.0)
MCHC: 32.8 g/dL (ref 32.0–36.0)
MCV: 89 fL (ref 81–101)
MONO#: 0.5 10*3/uL (ref 0.1–0.9)
MONO%: 12.2 % (ref 0.0–13.0)
NEUT#: 1.9 10*3/uL (ref 1.5–6.5)
NEUT%: 50 % (ref 39.6–80.0)
Platelets: 182 10*3/uL (ref 145–400)
RBC: 3.85 10*6/uL (ref 3.70–5.32)
RDW: 14.4 % (ref 11.1–15.7)
WBC: 3.8 10*3/uL — ABNORMAL LOW (ref 3.9–10.0)

## 2015-03-29 MED ORDER — DENOSUMAB 120 MG/1.7ML ~~LOC~~ SOLN
120.0000 mg | Freq: Once | SUBCUTANEOUS | Status: AC
  Administered 2015-03-29: 120 mg via SUBCUTANEOUS
  Filled 2015-03-29: qty 1.7

## 2015-03-29 NOTE — Progress Notes (Signed)
Hematology and Oncology Follow Up Visit  Amy White [id number] July 11, 1925 81 y.o. 03/29/2015   Principle Diagnosis:  Metastatic breast cancer, bone metastasis only.  Current Therapy:   1. Femara 2.5 mg p.o. daily. 2. Delton See 120mg  sq q 3 month     Interim History:  Ms.  White is for followup. She feels pretty good. She's had no problems with bone pain. She does have a keratosis in the right inguinal crease. She's going to see a Psychologist, sport and exercise for this.   We did do a CA 27.29 on her with her last visit. It was up a little bit at 63. We'll see what it is this time.  Her heart has been doing okay. She does have a pacemaker in. She is on Coumadin.  She's had a problem of bleeding.  This not been any change in bowel or bladder habits.  Her appetite is doing okay. She is gaining some weight. She is not too happy about this.  Overall, her performance status is [name] Medications:  Current outpatient prescriptions:  .  albuterol (PROVENTIL HFA;VENTOLIN HFA) 108 (90 BASE) MCG/ACT inhaler, Inhale 2 puffs into the lungs every 6 (six) hours as needed for wheezing or shortness of breath., Disp: , Rfl:  .  ALPRAZolam (XANAX) 0.5 MG tablet, Take 0.25 mg by mouth 2 (two) times daily as needed for anxiety. , Disp: , Rfl: 0 .  diclofenac sodium (VOLTAREN) 1 % GEL, Apply 1 application topically daily., Disp: , Rfl: 1 .  diltiazem (CARDIZEM CD) 240 MG 24 hr capsule, Take 1 capsule (240 mg total) by mouth daily., Disp: 30 capsule, Rfl: 6 .  ergocalciferol (VITAMIN D2) 50000 UNITS capsule, Take 1 capsule (50,000 Units total) by mouth once a week., Disp: 4 capsule, Rfl: 11 .  letrozole (FEMARA) 2.5 MG tablet, TAKE 1 TABLET BY MOUTH EVERY DAY, Disp: 30 tablet, Rfl: 0 .  levothyroxine (SYNTHROID, LEVOTHROID) 75 MCG tablet, Take 75 mg by mouth Daily., Disp: , Rfl:  .  lisinopril (PRINIVIL,ZESTRIL) 10 MG tablet, TAKE 1 TABLET (10 MG TOTAL) BY MOUTH DAILY., Disp: 30 tablet, Rfl: 9 .  tetrahydrozoline-zinc  (VISINE-AC) 0.05-0.25 % ophthalmic solution, Place 2 drops into both eyes 3 (three) times daily as needed (red eyes)., Disp: , Rfl:  .  warfarin (COUMADIN) 5 MG tablet, Take 5 mg by mouth daily at 6 PM. , Disp: , Rfl: 4 .  warfarin (COUMADIN) 5 MG tablet, TAKE 1 TABLET BY MOUTH DAILY OR AS DIRECTED, Disp: 30 tablet, Rfl: 5 No current facility-administered medications for this visit.  Facility-Administered Medications Ordered in Other Visits:  .  denosumab (XGEVA) injection 120 mg, 120 mg, Subcutaneous, Once, Volanda Napoleon, MD  Allergies:  Allergies  Allergen Reactions  . Crestor [Rosuvastatin]   . Ciprofloxacin Rash    Rash in the mouth  . Penicillins Hives and Rash  . Vancomycin Rash    "Red-man"    Past Medical History, Surgical history, Social history, and Family History were reviewed and updated.  Review of Systems: As above  Physical Exam:  height is 5\' 8"  (1.727 m) and weight is 176 lb (79.833 kg). Her oral temperature is 97.6 F (36.4 C). Her blood pressure is 148/61 and her pulse is 80. Her respiration is 16.   Elderly, thin but well-nourished white female. Head and neck exam shows no ocular or oral lesions. There are no palpable cervical or supraclavicular lymph nodes. Lungs are clear. Cardiac exam regular rate and rhythm with a normal  S1 and S2. There are no murmurs, rubs or bruits.. Abdomen is soft. She has good bowels. There is no fluid wave. There is no palpable liver or spleen tip. There is a large seborrheic keratosis in the right inguinal fold. It is somewhat irritated. Back exam shows some slight kyphosis. No tenderness noted over the spine. She has slight tenderness over the right anterior pelvic region. Extremities shows osteoarthritic changes in her joints. She has some age related muscle atrophy. Neurological exam is nonfocal. Lymph node exam shows no adenopathy.   Lab Results  Component Value Date   WBC 3.8* 03/29/2015   HGB 11.2* 03/29/2015   HCT 34.1*  03/29/2015   MCV 89 03/29/2015   PLT 182 03/29/2015     Chemistry      Component Value Date/Time   NA 139 02/13/2015 1144   NA 142 07/28/2014 0620   K 4.5 02/13/2015 1144   K 4.5 07/28/2014 0620   CL 107 02/13/2015 1144   CL 112 07/28/2014 0620   CO2 26 02/13/2015 1144   CO2 25 07/28/2014 0620   BUN 27* 02/13/2015 1144   BUN 13 07/28/2014 0620   CREATININE 1.4* 02/13/2015 1144   CREATININE 0.93 07/28/2014 0620      Component Value Date/Time   CALCIUM 9.4 02/13/2015 1144   CALCIUM 8.8 07/28/2014 0620   ALKPHOS 54 02/13/2015 1144   ALKPHOS 44 07/27/2014 1247   AST 26 02/13/2015 1144   AST 23 07/27/2014 1247   ALT 23 02/13/2015 1144   ALT 13 07/27/2014 1247   BILITOT 1.10 02/13/2015 1144   BILITOT 1.7* 07/27/2014 1247         Impression and Plan: Amy White is a 81 year old white female with metastatic breast cancer. Her disease is confined to her bones. She has been stable. She has been stable now for about 91/2 years. She's done incredibly well. We will continue her on the Femara.  Her last CA 27.29 was up a little bit. We will see what it is this time.  I will plan to do a bone scan on her. We will do this right after Thanksgiving. I will then see her back afterwards.  She is little bit more anemic. I think this might be from her renal insufficiency.  Volanda Napoleon, MD 10/21/20163:36 PM

## 2015-03-29 NOTE — Patient Instructions (Signed)
Denosumab injection  What is this medicine?  DENOSUMAB (den oh sue mab) slows bone breakdown. Prolia is used to treat osteoporosis in women after menopause and in men. Xgeva is used to prevent bone fractures and other bone problems caused by cancer bone metastases. Xgeva is also used to treat giant cell tumor of the bone.  This medicine may be used for other purposes; ask your health care provider or pharmacist if you have questions.  What should I tell my health care provider before I take this medicine?  They need to know if you have any of these conditions:  -dental disease  -eczema  -infection or history of infections  -kidney disease or on dialysis  -low blood calcium or vitamin D  -malabsorption syndrome  -scheduled to have surgery or tooth extraction  -taking medicine that contains denosumab  -thyroid or parathyroid disease  -an unusual reaction to denosumab, other medicines, foods, dyes, or preservatives  -pregnant or trying to get pregnant  -breast-feeding  How should I use this medicine?  This medicine is for injection under the skin. It is given by a health care professional in a hospital or clinic setting.  If you are getting Prolia, a special MedGuide will be given to you by the pharmacist with each prescription and refill. Be sure to read this information carefully each time.  For Prolia, talk to your pediatrician regarding the use of this medicine in children. Special care may be needed. For Xgeva, talk to your pediatrician regarding the use of this medicine in children. While this drug may be prescribed for children as young as 13 years for selected conditions, precautions do apply.  Overdosage: If you think you have taken too much of this medicine contact a poison control center or emergency room at once.  NOTE: This medicine is only for you. Do not share this medicine with others.  What if I miss a dose?  It is important not to miss your dose. Call your doctor or health care professional if you are  unable to keep an appointment.  What may interact with this medicine?  Do not take this medicine with any of the following medications:  -other medicines containing denosumab  This medicine may also interact with the following medications:  -medicines that suppress the immune system  -medicines that treat cancer  -steroid medicines like prednisone or cortisone  This list may not describe all possible interactions. Give your health care provider a list of all the medicines, herbs, non-prescription drugs, or dietary supplements you use. Also tell them if you smoke, drink alcohol, or use illegal drugs. Some items may interact with your medicine.  What should I watch for while using this medicine?  Visit your doctor or health care professional for regular checks on your progress. Your doctor or health care professional may order blood tests and other tests to see how you are doing.  Call your doctor or health care professional if you get a cold or other infection while receiving this medicine. Do not treat yourself. This medicine may decrease your body's ability to fight infection.  You should make sure you get enough calcium and vitamin D while you are taking this medicine, unless your doctor tells you not to. Discuss the foods you eat and the vitamins you take with your health care professional.  See your dentist regularly. Brush and floss your teeth as directed. Before you have any dental work done, tell your dentist you are receiving this medicine.  Do   not become pregnant while taking this medicine or for 5 months after stopping it. Women should inform their doctor if they wish to become pregnant or think they might be pregnant. There is a potential for serious side effects to an unborn child. Talk to your health care professional or pharmacist for more information.  What side effects may I notice from receiving this medicine?  Side effects that you should report to your doctor or health care professional as soon as  possible:  -allergic reactions like skin rash, itching or hives, swelling of the face, lips, or tongue  -breathing problems  -chest pain  -fast, irregular heartbeat  -feeling faint or lightheaded, falls  -fever, chills, or any other sign of infection  -muscle spasms, tightening, or twitches  -numbness or tingling  -skin blisters or bumps, or is dry, peels, or red  -slow healing or unexplained pain in the mouth or jaw  -unusual bleeding or bruising  Side effects that usually do not require medical attention (Report these to your doctor or health care professional if they continue or are bothersome.):  -muscle pain  -stomach upset, gas  This list may not describe all possible side effects. Call your doctor for medical advice about side effects. You may report side effects to FDA at 1-800-FDA-1088.  Where should I keep my medicine?  This medicine is only given in a clinic, doctor's office, or other health care setting and will not be stored at home.  NOTE: This sheet is a summary. It may not cover all possible information. If you have questions about this medicine, talk to your doctor, pharmacist, or health care provider.      2016, [employer]. (2011-11-23 12:37:47)

## 2015-04-01 ENCOUNTER — Ambulatory Visit: Payer: Self-pay | Admitting: Surgery

## 2015-04-01 NOTE — H&P (Signed)
Amy White 04/01/2015 11:12 AM Location: [facility] Patient #: [id number] DOB: 1925/07/17 Single / Language: Amy White / Race: White Female  History of Present Illness Amy Moores A. Amy Humm MD; 04/01/2015 12:52 PM) Patient words: Patient sent at the request of Dr. Mayra Neer for skin lesion in her right groin. It has been present for many years and is getting larger. It does bleed from time to time. It is pigmented. Causes mild discomfort when he catches her clothing.  The patient is a 81 year old female.   Other Problems Amy White, CMA; 04/01/2015 11:12 AM) Arthritis Asthma Atrial Fibrillation Back Pain Breast Cancer Cancer Cholelithiasis Chronic Obstructive Lung Disease Emphysema Of Lung Hemorrhoids High blood pressure Thyroid Disease Vascular Disease  Past Surgical History Amy White, CMA; 04/01/2015 11:12 AM) Breast Biopsy Left. Colon Polyp Removal - Colonoscopy Colon Polyp Removal - Open Foot Surgery Left. Mastectomy Left. Tonsillectomy  Diagnostic Studies History Amy White, CMA; 04/01/2015 11:12 AM) Colonoscopy 5-10 years ago Pap Smear >5 years ago  Allergies Amy White, CMA; 04/01/2015 11:13 AM) Crestor *ANTIHYPERLIPIDEMICS* Ciprofloxacin *CHEMICALS* Penicillin V *PENICILLINS* Hives, Rash. Vancomycin HCl *Anti-infective Agents - Misc.** Rash.  Medication History Amy White, CMA; 04/01/2015 11:15 AM) ALPRAZolam (0.5MG  Tablet, Oral) Active. Diclofenac Sodium (1% Gel, Transdermal) Active. DiltiaZEM HCl ER Coated Beads (240MG  Capsule ER 24HR, Oral) Active. Vitamin D (50000U Tablet, Oral) Active. Letrozole (2.5MG  Tablet, Oral) Active. Levothyroxine Sodium (75MCG Tablet, Oral) Active. Lisinopril (10MG  Tablet, Oral) Active. Warfarin Sodium (5MG  Tablet, Oral) Active. Visine Extra (0.05% Solution, Ophthalmic) Active. Medications Reconciled  Pregnancy / Birth History Amy White, CMA; 04/01/2015  11:12 AM) Age at menarche 42 years. Age of menopause 29-60 Gravida 1 Maternal age 61-30 Para 1     Review of Systems Amy White CMA; 04/01/2015 11:12 AM) General Present- Fatigue and Weight Gain. Not Present- Appetite Loss, Chills, Fever, Night Sweats and Weight Loss. Skin Present- New Lesions. Not Present- Change in Wart/Mole, Dryness, Hives, Jaundice, Non-Healing Wounds, Rash and Ulcer. HEENT Present- Hearing Loss, Ringing in the Ears and Wears glasses/contact lenses. Not Present- Earache, Hoarseness, Nose Bleed, Oral Ulcers, Seasonal Allergies, Sinus Pain, Sore Throat, Visual Disturbances and Yellow Eyes. Respiratory Present- Difficulty Breathing and Wheezing. Not Present- Bloody sputum, Chronic Cough and Snoring. Breast Present- Breast Pain. Not Present- Breast Mass, Nipple Discharge and Skin Changes. Cardiovascular Present- Leg Cramps, Palpitations, Shortness of Breath and Swelling of Extremities. Not Present- Chest Pain, Difficulty Breathing Lying Down and Rapid Heart Rate. Gastrointestinal Present- Bloating, Difficulty Swallowing and Hemorrhoids. Not Present- Abdominal Pain, Bloody Stool, Change in Bowel Habits, Chronic diarrhea, Constipation, Excessive gas, Gets full quickly at meals, Indigestion, Nausea, Rectal Pain and Vomiting. Musculoskeletal Present- Back Pain, Joint Stiffness, Muscle Weakness and Swelling of Extremities. Not Present- Joint Pain and Muscle Pain. Neurological Present- Tingling, Trouble walking and Weakness. Not Present- Decreased Memory, Fainting, Headaches, Numbness, Seizures and Tremor. Psychiatric Present- Anxiety. Not Present- Bipolar, Change in Sleep Pattern, Depression, Fearful and Frequent crying. Endocrine Not Present- Cold Intolerance, Excessive Hunger, Hair Changes, Heat Intolerance, Hot flashes and New Diabetes. Hematology Not Present- Easy Bruising, Excessive bleeding, Gland problems, HIV and Persistent Infections.  Vitals Amy White CMA;  04/01/2015 11:15 AM) 04/01/2015 11:15 AM Weight: 176 lb Height: 67in Body Surface Area: 1.92 m Body Mass Index: 27.57 kg/m  Temp.: 97.69F(Temporal)  Pulse: 60 (Regular)  BP: 128/70 (Sitting, Left Arm, Standard)      Physical Exam (Amy Joe A. Welby Montminy MD; 04/01/2015 12:53 PM)  General Mental Status-Alert. General Appearance-Consistent with stated age. Hydration-Well  hydrated. Voice-Normal.  Integumentary Note: 5 cm slightly pigmented lesion right groin over the inguinal canal with some ulceration centrally. Borders are regular. It is raised.  Eye Eyeball - Bilateral-Extraocular movements intact. Sclera/Conjunctiva - Bilateral-No scleral icterus.  Chest and Lung Exam Chest and lung exam reveals -quiet, even and easy respiratory effort with no use of accessory muscles and on auscultation, normal breath sounds, no adventitious sounds and normal vocal resonance. Inspection Chest Wall - Normal. Back - normal. Note: Pacemaker noted right chest.  Cardiovascular Cardiovascular examination reveals -normal heart sounds, regular rate and rhythm with no murmurs and normal pedal pulses bilaterally.  Neurologic Neurologic evaluation reveals -alert and oriented x 3 with no impairment of recent or remote memory. Mental Status-Normal.  Musculoskeletal Normal Exam - Left-Upper Extremity Strength Normal and Lower Extremity Strength Normal. Normal Exam - Right-Upper Extremity Strength Normal and Lower Extremity Strength Normal. Note: Left ankle scar noted.    Assessment & Plan (Amy Kukla A. Jarrod Mcenery MD; 04/01/2015 12:54 PM)  BLEEDING PIGMENTED SKIN LESION (D22.9) Impression: right groin raised ulcerated skin lesion 5 cm needs excision but is 5 cm and will neeed to be done in the operating room needs cardiac clearance and pacemaker issues addressed hold coumadin 5 days before if ok with cardiology Risk of bleeding, infection, cardiac event, the need  for more surgery, damage to nearby nerves causing disability, blood vessel injury, wound infection, bleeding, death, DVT, and the need for other procedures and treatments. Patient agrees to proceed.  Current Plans The anatomy and the physiology was discussed. The pathophysiology and natural history of the disease was discussed. Options were discussed and recommendations were made. Technique, risks, benefits, & alternatives were discussed. Risks such as stroke, heart attack, bleeding, indection, death, and other risks discussed. Questions answered. The patient agrees to proceed. The pathophysiology of skin & subcutaneous masses was discussed. Natural history risks without surgery were discussed. I recommended surgery to remove the mass. I explained the technique of removal with use of local anesthesia & possible need for more aggressive sedation/anesthesia for patient comfort.  Risks such as bleeding, infection, wound breakdown, heart attack, death, and other risks were discussed. I noted a good likelihood this will help address the problem. Possibility that this will not correct all symptoms was explained. Possibility of regrowth/recurrence of the mass was discussed. We will work to minimize complications. Questions were answered. The patient expresses understanding & wishes to proceed with surgery.  Pt Education - [facility] Free Text Education/Instructions: discussed with patient and provided information.

## 2015-04-02 ENCOUNTER — Encounter: Payer: Self-pay | Admitting: Cardiovascular Disease

## 2015-04-07 ENCOUNTER — Emergency Department (HOSPITAL_COMMUNITY)
Admission: EM | Admit: 2015-04-07 | Discharge: 2015-04-07 | Disposition: A | Discharge status: Home / Self Care | Payer: Medicare Other | Attending: Emergency Medicine | Admitting: Emergency Medicine

## 2015-04-07 ENCOUNTER — Encounter (HOSPITAL_COMMUNITY): Payer: Self-pay | Admitting: Emergency Medicine

## 2015-04-07 DIAGNOSIS — E0590 Thyrotoxicosis, unspecified without thyrotoxic crisis or storm: Secondary | ICD-10-CM | POA: Insufficient documentation

## 2015-04-07 DIAGNOSIS — N939 Abnormal uterine and vaginal bleeding, unspecified: Secondary | ICD-10-CM | POA: Diagnosis present

## 2015-04-07 DIAGNOSIS — Z85830 Personal history of malignant neoplasm of bone: Secondary | ICD-10-CM | POA: Insufficient documentation

## 2015-04-07 DIAGNOSIS — Z791 Long term (current) use of non-steroidal anti-inflammatories (NSAID): Secondary | ICD-10-CM | POA: Diagnosis not present

## 2015-04-07 DIAGNOSIS — I4891 Unspecified atrial fibrillation: Secondary | ICD-10-CM | POA: Insufficient documentation

## 2015-04-07 DIAGNOSIS — J449 Chronic obstructive pulmonary disease, unspecified: Secondary | ICD-10-CM | POA: Diagnosis not present

## 2015-04-07 DIAGNOSIS — Z853 Personal history of malignant neoplasm of breast: Secondary | ICD-10-CM | POA: Insufficient documentation

## 2015-04-07 DIAGNOSIS — I10 Essential (primary) hypertension: Secondary | ICD-10-CM | POA: Insufficient documentation

## 2015-04-07 DIAGNOSIS — Z8719 Personal history of other diseases of the digestive system: Secondary | ICD-10-CM | POA: Insufficient documentation

## 2015-04-07 DIAGNOSIS — F419 Anxiety disorder, unspecified: Secondary | ICD-10-CM | POA: Insufficient documentation

## 2015-04-07 DIAGNOSIS — Z79899 Other long term (current) drug therapy: Secondary | ICD-10-CM | POA: Diagnosis not present

## 2015-04-07 DIAGNOSIS — N952 Postmenopausal atrophic vaginitis: Secondary | ICD-10-CM | POA: Insufficient documentation

## 2015-04-07 DIAGNOSIS — Z7901 Long term (current) use of anticoagulants: Secondary | ICD-10-CM | POA: Insufficient documentation

## 2015-04-07 LAB — CBC WITH DIFFERENTIAL/PLATELET
Basophils Absolute: 0 10*3/uL (ref 0.0–0.1)
Basophils Relative: 1 %
Eosinophils Absolute: 0.3 10*3/uL (ref 0.0–0.7)
Eosinophils Relative: 6 %
HCT: 33.6 % — ABNORMAL LOW (ref 36.0–46.0)
Hemoglobin: 11 g/dL — ABNORMAL LOW (ref 12.0–15.0)
Lymphocytes Relative: 31 %
Lymphs Abs: 1.3 10*3/uL (ref 0.7–4.0)
MCH: 28.6 pg (ref 26.0–34.0)
MCHC: 32.7 g/dL (ref 30.0–36.0)
MCV: 87.5 fL (ref 78.0–100.0)
Monocytes Absolute: 0.5 10*3/uL (ref 0.1–1.0)
Monocytes Relative: 12 %
Neutro Abs: 2 10*3/uL (ref 1.7–7.7)
Neutrophils Relative %: 50 %
Platelets: 158 10*3/uL (ref 150–400)
RBC: 3.84 MIL/uL — ABNORMAL LOW (ref 3.87–5.11)
RDW: 14.8 % (ref 11.5–15.5)
WBC: 4.1 10*3/uL (ref 4.0–10.5)

## 2015-04-07 LAB — COMPREHENSIVE METABOLIC PANEL
ALT: 15 U/L (ref 14–54)
AST: 24 U/L (ref 15–41)
Albumin: 3.6 g/dL (ref 3.5–5.0)
Alkaline Phosphatase: 52 U/L (ref 38–126)
Anion gap: 9 (ref 5–15)
BUN: 20 mg/dL (ref 6–20)
CO2: 25 mmol/L (ref 22–32)
Calcium: 9.7 mg/dL (ref 8.9–10.3)
Chloride: 107 mmol/L (ref 101–111)
Creatinine, Ser: 1.22 mg/dL — ABNORMAL HIGH (ref 0.44–1.00)
GFR calc Af Amer: 45 mL/min — ABNORMAL LOW (ref >60)
GFR calc non Af Amer: 39 mL/min — ABNORMAL LOW (ref >60)
Glucose, Bld: 106 mg/dL — ABNORMAL HIGH (ref 65–99)
Potassium: 4.8 mmol/L (ref 3.5–5.1)
Sodium: 141 mmol/L (ref 135–145)
Total Bilirubin: 0.7 mg/dL (ref 0.3–1.2)
Total Protein: 6.1 g/dL — ABNORMAL LOW (ref 6.5–8.1)

## 2015-04-07 LAB — WET PREP, GENITAL
Clue Cells Wet Prep HPF POC: NONE SEEN
Trich, Wet Prep: NONE SEEN
Yeast Wet Prep HPF POC: NONE SEEN

## 2015-04-07 LAB — URINALYSIS, ROUTINE W REFLEX MICROSCOPIC
Bilirubin Urine: NEGATIVE
Glucose, UA: NEGATIVE mg/dL
Ketones, ur: NEGATIVE mg/dL
Leukocytes, UA: NEGATIVE
Nitrite: NEGATIVE
Protein, ur: NEGATIVE mg/dL
Specific Gravity, Urine: 1.015 (ref 1.005–1.030)
Urobilinogen, UA: 1 mg/dL (ref 0.0–1.0)
pH: 6.5 (ref 5.0–8.0)

## 2015-04-07 LAB — PROTIME-INR
INR: 2.7 — ABNORMAL HIGH (ref 0.00–1.49)
Prothrombin Time: 28.3 seconds — ABNORMAL HIGH (ref 11.6–15.2)

## 2015-04-07 LAB — URINE MICROSCOPIC-ADD ON

## 2015-04-07 NOTE — ED Provider Notes (Signed)
By signing my name below, I, [name], attest that this documentation has been prepared under the direction and in the presence of [name], DO. Electronically Signed: [name], ED Scribe. 04/07/2015. 1:41 AM.   TIME SEEN: 1:35 AM   CHIEF COMPLAINT:  Chief Complaint  Patient presents with  . Vaginal Bleeding    HPI: HPI Comments: Amy White is a 81 y.o. female with h/o Afib on Coumadin, HTN, hyperthyroidism, COPD, GERD, breast cancer who presents to the Emergency Department complaining of moderate vaginal bleeding onset tonight. Pt notes that she is having soft stools. No rectal bleeding or melena. No h/o hysterectomy, cervical or uterine cancer. H/o mastectomy. Pt is not currently sexually active (last intercourse 28 years ago). No Hx of similar symptoms. Pt is currently taking Coumadin. She denies fever, nausea, vomiting, diarrhea, dysuria. No vaginal discharge.   ROS: See HPI Constitutional: no fever  Eyes: no drainage  ENT: no runny nose   Cardiovascular:  no chest pain  Resp: no SOB  GI: no vomiting, nausea, diarrhea, bloody stools.  GU: no dysuria. Positive for vaginal bleeding.  Integumentary: no rash  Allergy: no hives  Musculoskeletal: no leg swelling  Neurological: no slurred speech ROS otherwise negative  PAST MEDICAL HISTORY/PAST SURGICAL HISTORY:  Past Medical History  Diagnosis Date  . Atrial fibrillation ([place])   . Hypertension   . Hyperthyroidism   . COPD (chronic obstructive pulmonary disease) ([place])   . Asthma   . GERD (gastroesophageal reflux disease)   . H/O hiatal hernia   . Arthritis     "all over"  . Anxiety   . Slow urinary stream   . Breast cancer metastasized to bone ([place]) 05/13/2011  . Breast cancer ([place]) 1997    "left"  . SSS (sick sinus syndrome) ([facility])     MEDICATIONS:  Prior to Admission medications   Medication Sig Start Date End Date Taking? Authorizing Provider  albuterol (PROVENTIL HFA;VENTOLIN HFA) 108 (90 BASE)  MCG/ACT inhaler Inhale 2 puffs into the lungs every 6 (six) hours as needed for wheezing or shortness of breath.    Historical Provider, MD  ALPRAZolam Duanne Moron) 0.5 MG tablet Take 0.25 mg by mouth 2 (two) times daily as needed for anxiety.  07/06/14   Historical Provider, MD  diclofenac sodium (VOLTAREN) 1 % GEL Apply 1 application topically daily. 01/06/15   Historical Provider, MD  diltiazem (CARDIZEM CD) 240 MG 24 hr capsule Take 1 capsule (240 mg total) by mouth daily. 01/08/15   Mihai Croitoru, MD  ergocalciferol (VITAMIN D2) 50000 UNITS capsule Take 1 capsule (50,000 Units total) by mouth once a week. 02/15/15   Volanda Napoleon, MD  letrozole [facility]) 2.5 MG tablet TAKE 1 TABLET BY MOUTH EVERY DAY 03/04/15   Volanda Napoleon, MD  levothyroxine (SYNTHROID, LEVOTHROID) 75 MCG tablet Take 75 mg by mouth Daily. 04/27/11   Historical Provider, MD  lisinopril (PRINIVIL,ZESTRIL) 10 MG tablet TAKE 1 TABLET (10 MG TOTAL) BY MOUTH DAILY. 06/05/14   Mihai Croitoru, MD  tetrahydrozoline-zinc (VISINE-AC) 0.05-0.25 % ophthalmic solution Place 2 drops into both eyes 3 (three) times daily as needed (red eyes).    Historical Provider, MD  warfarin (COUMADIN) 5 MG tablet Take 5 mg by mouth daily at 6 PM.  07/23/14   Historical Provider, MD  warfarin (COUMADIN) 5 MG tablet TAKE 1 TABLET BY MOUTH DAILY OR AS DIRECTED 03/27/15   [name], MD    ALLERGIES:  Allergies  Allergen Reactions  .  Crestor [Rosuvastatin]   . Ciprofloxacin Rash    Rash in the mouth  . Penicillins Hives and Rash  . Vancomycin Rash    "Red-man"    SOCIAL HISTORY:  Social History  Substance Use Topics  . Smoking status: Never Smoker   . Smokeless tobacco: Never Used     Comment: NEVER USED TOBACCO  . Alcohol Use: 0.0 oz/week    0 Standard drinks or equivalent per week     Comment: 03/30/2014 "might have a drink once/month in the summertime"    FAMILY HISTORY: Family History  Problem Relation Age of Onset  . Heart attack Father    . Alzheimer's disease Mother   . Cancer Sister     EXAM: BP 147/49 mmHg  Pulse 73  Temp(Src) 98 F (36.7 C) (Oral)  Resp 16  SpO2 96% CONSTITUTIONAL: Alert and oriented and responds appropriately to questions. Well-appearing; well-nourished HEAD: Normocephalic EYES: Conjunctivae clear, PERRL ENT: normal nose; no rhinorrhea; moist mucous membranes; pharynx without lesions noted NECK: Supple, no meningismus, no LAD  CARD: RRR; S1 and S2 appreciated; no murmurs, no clicks, no rubs, no gallops RESP: Normal chest excursion without splinting or tachypnea; breath sounds clear and equal bilaterally; no wheezes, no rhonchi, no rales,  ABD/GI: Normal bowel sounds; non-distended; soft, non-tender, no rebound, no guarding GU:  Patient has atrophy of the vaginal canal with areas that have mild bleeding on the outside of the vaginal introitus, no blood coming from the cervical os, no adnexal tenderness or fullness, no cervical motion tenderness, otherwise normal external genitalia RECTAL:  nonthrombosed external hemorrhoids, no gross blood or melena BACK:  The back appears normal and is non-tender to palpation, there is no CVA tenderness EXT: Normal ROM in all joints; non-tender to palpation; no edema; normal capillary refill; no cyanosis    SKIN: Normal color for age and race; warm NEURO: Moves all extremities equally PSYCH: The patient's mood and manner are appropriate. Grooming and personal hygiene are appropriate.  MEDICAL DECISION MAKING: Patient here with vaginal bleeding. Describes it as spotting that started tonight. No hematuria. No bloody stools or melena. Hemodynamically stable. Hemoglobin is 11. INR therapeutic at 2.7. Bleeding appears to be coming from the vaginal wall and at the introitus rather from the uterus or through the cervix. Likely secondary to atrophic vaginitis. Wet prep is unremarkable. Urine shows hemoglobin but no other sign of infection. She is not having any dysuria.  Bleeding is minimal at best. I feel she is safe to go home and follow-up with an OB/GYN or her primary care provider. Discussed return precautions including vaginal bleeding return precautions. She verbalized understanding and is comfortable with this plan.      I personally performed the services described in this documentation, which was scribed in my presence. The recorded information has been reviewed and is accurate.   [place], DO 04/07/15 [phone]

## 2015-04-07 NOTE — Discharge Instructions (Signed)
Atrophic Vaginitis Atrophic vaginitis is a condition in which the tissues that line the vagina become dry and thin. This condition is most common in women who have stopped having regular menstrual periods (menopause). This usually starts when a woman is 63-79 years old. Estrogen helps to keep the vagina moist. It stimulates the vagina to produce a clear fluid that lubricates the vagina for sexual intercourse. This fluid also protects the vagina from infection. Lack of estrogen can cause the lining of the vagina to get thinner and dryer. The vagina may also shrink in size. It may become less elastic. Atrophic vaginitis tends to get worse over time as a woman's estrogen level drops. CAUSES This condition is caused by the normal drop in estrogen that happens around the time of menopause. RISK FACTORS Certain conditions or situations may lower a woman's estrogen level, which increases her risk of atrophic vaginitis. These include:  Taking medicine that blocks estrogen.  Having ovaries removed surgically.  Being treated for cancer with X-ray treatment (radiation) or medicines (chemotherapy).  Exercising very hard and often.  Having an eating disorder (anorexia).  Giving birth or breastfeeding.  Being over the age of 28.  Smoking. SYMPTOMS Symptoms of this condition include:  Pain, soreness, or bleeding during sexual intercourse (dyspareunia).  Vaginal burning, irritation, or itching.  Pain or bleeding during a vaginal examination using a speculum (pelvic exam).  Loss of interest in sexual activity.  Having burning pain when passing urine.  Vaginal discharge that is brown or yellow. In some cases, there are no symptoms. DIAGNOSIS This condition is diagnosed with a medical history and physical exam. This will include a pelvic exam that checks whether the inside of your vagina appears pale, thin, or dry. Rarely, you may also have other tests, including:  A urine test.  A test that  checks the acid balance in your vaginal fluid (acid balance test). TREATMENT Treatment for this condition may depend on the severity of your symptoms. Treatment may include:  Using an over-the-counter vaginal lubricant before you have sexual intercourse.  Using a long-acting vaginal moisturizer.  Using low-dose vaginal estrogen for moderate to severe symptoms that do not respond to other treatments. Options include creams, tablets, and inserts (vaginal rings). Before using vaginal estrogen, tell your health care provider if you have a history of:  Breast cancer.  Endometrial cancer.  Blood clots.  Taking medicines. You may be able to take a daily pill for dyspareunia. Discuss all of the risks of this medicine with your health care provider. It is usually not recommended for women who have a family history or personal history of breast cancer. If your symptoms are very mild and you are not sexually active, you may not need treatment. HOME CARE INSTRUCTIONS  Take medicines only as directed by your health care provider. Do not use herbal or alternative medicines unless your health care provider says that you can.  Use over-the-counter creams, lubricants, or moisturizers for dryness only as directed by your health care provider.  If your atrophic vaginitis is caused by menopause, discuss all of your menopausal symptoms and treatment options with your health care provider.  Do not douche.  Do not use products that can make your vagina dry. These include:  Scented feminine sprays.  Scented tampons.  Scented soaps.  If it hurts to have sex, talk with your sexual partner. SEEK MEDICAL CARE IF:  Your discharge looks different than normal.  Your vagina has an unusual smell.  You have  new symptoms.  Your symptoms do not improve with treatment.  Your symptoms get worse.   This information is not intended to replace advice given to you by your health care provider. Make sure you  discuss any questions you have with your health care provider.   Document Released: 10/09/2014 Document Reviewed: 10/09/2014 Elsevier Interactive Patient Education 2016 [email or web address] [place] # [place], [place] [phone]    [email or web address] [address] [place], [place] ([place]) [place] # [place], [place] [phone]   [facility] [email or web address] [address] [place], [place] [phone]   [facility] [email or web address] [address] [place], [place] [phone]   [facility] [email or web address] [phone]

## 2015-04-07 NOTE — ED Notes (Addendum)
Pt. reports vaginal bleeding onset this evening , denies injury , no dysuria or fever . Pt. stated that she taking Coumadin .

## 2015-04-08 LAB — GC/CHLAMYDIA PROBE AMP (~~LOC~~) NOT AT ARMC
Chlamydia: NEGATIVE
Neisseria Gonorrhea: NEGATIVE

## 2015-04-09 ENCOUNTER — Ambulatory Visit (INDEPENDENT_AMBULATORY_CARE_PROVIDER_SITE_OTHER): Payer: Medicare Other | Admitting: *Deleted

## 2015-04-09 DIAGNOSIS — I495 Sick sinus syndrome: Secondary | ICD-10-CM

## 2015-04-10 ENCOUNTER — Other Ambulatory Visit: Payer: Self-pay | Admitting: Hematology & Oncology

## 2015-04-11 NOTE — Progress Notes (Signed)
Remote pacemaker transmission.   

## 2015-04-18 LAB — CUP PACEART REMOTE DEVICE CHECK
Battery Remaining Longevity: 124 mo
Battery Voltage: 3.03 V
Brady Statistic AP VP Percent: 0.07 %
Brady Statistic AP VS Percent: 73.46 %
Brady Statistic AS VP Percent: 0.03 %
Brady Statistic AS VS Percent: 26.44 %
Brady Statistic RA Percent Paced: 73.54 %
Brady Statistic RV Percent Paced: 0.1 %
Date Time Interrogation Session: 20161101140712
Implantable Lead Implant Date: 20160223
Implantable Lead Implant Date: 20160223
Implantable Lead Location: 753859
Implantable Lead Location: 753860
Implantable Lead Model: 5076
Implantable Lead Model: 5076
Lead Channel Impedance Value: 361 Ohm
Lead Channel Impedance Value: 456 Ohm
Lead Channel Impedance Value: 475 Ohm
Lead Channel Impedance Value: 513 Ohm
Lead Channel Pacing Threshold Amplitude: 0.75 V
Lead Channel Pacing Threshold Amplitude: 0.75 V
Lead Channel Pacing Threshold Pulse Width: 0.4 ms
Lead Channel Pacing Threshold Pulse Width: 0.4 ms
Lead Channel Sensing Intrinsic Amplitude: 1.5 mV
Lead Channel Sensing Intrinsic Amplitude: 1.5 mV
Lead Channel Sensing Intrinsic Amplitude: 11.875 mV
Lead Channel Sensing Intrinsic Amplitude: 11.875 mV
Lead Channel Setting Pacing Amplitude: 1.5 V
Lead Channel Setting Pacing Amplitude: 2 V
Lead Channel Setting Pacing Pulse Width: 0.4 ms
Lead Channel Setting Sensing Sensitivity: 0.9 mV

## 2015-04-19 ENCOUNTER — Encounter: Payer: Self-pay | Admitting: Cardiology

## 2015-04-29 ENCOUNTER — Encounter (HOSPITAL_COMMUNITY)
Admission: RE | Admit: 2015-04-29 | Discharge: 2015-04-29 | Disposition: A | Discharge status: Home / Self Care | Payer: Medicare Other | Source: Ambulatory Visit | Attending: Surgery | Admitting: Surgery

## 2015-04-29 ENCOUNTER — Encounter (HOSPITAL_COMMUNITY): Payer: Self-pay

## 2015-04-29 DIAGNOSIS — Z79899 Other long term (current) drug therapy: Secondary | ICD-10-CM | POA: Diagnosis not present

## 2015-04-29 DIAGNOSIS — F419 Anxiety disorder, unspecified: Secondary | ICD-10-CM | POA: Diagnosis not present

## 2015-04-29 DIAGNOSIS — L989 Disorder of the skin and subcutaneous tissue, unspecified: Secondary | ICD-10-CM | POA: Diagnosis present

## 2015-04-29 DIAGNOSIS — E0590 Thyrotoxicosis, unspecified without thyrotoxic crisis or storm: Secondary | ICD-10-CM | POA: Diagnosis not present

## 2015-04-29 DIAGNOSIS — I4891 Unspecified atrial fibrillation: Secondary | ICD-10-CM | POA: Diagnosis not present

## 2015-04-29 DIAGNOSIS — Z7901 Long term (current) use of anticoagulants: Secondary | ICD-10-CM | POA: Diagnosis not present

## 2015-04-29 DIAGNOSIS — J449 Chronic obstructive pulmonary disease, unspecified: Secondary | ICD-10-CM | POA: Diagnosis not present

## 2015-04-29 DIAGNOSIS — J45909 Unspecified asthma, uncomplicated: Secondary | ICD-10-CM | POA: Diagnosis not present

## 2015-04-29 DIAGNOSIS — I10 Essential (primary) hypertension: Secondary | ICD-10-CM | POA: Diagnosis not present

## 2015-04-29 DIAGNOSIS — Z950 Presence of cardiac pacemaker: Secondary | ICD-10-CM | POA: Diagnosis not present

## 2015-04-29 DIAGNOSIS — Z9012 Acquired absence of left breast and nipple: Secondary | ICD-10-CM | POA: Diagnosis not present

## 2015-04-29 DIAGNOSIS — Z85830 Personal history of malignant neoplasm of bone: Secondary | ICD-10-CM | POA: Diagnosis not present

## 2015-04-29 DIAGNOSIS — M1990 Unspecified osteoarthritis, unspecified site: Secondary | ICD-10-CM | POA: Diagnosis not present

## 2015-04-29 DIAGNOSIS — L820 Inflamed seborrheic keratosis: Secondary | ICD-10-CM | POA: Diagnosis not present

## 2015-04-29 DIAGNOSIS — Z853 Personal history of malignant neoplasm of breast: Secondary | ICD-10-CM | POA: Diagnosis not present

## 2015-04-29 DIAGNOSIS — K219 Gastro-esophageal reflux disease without esophagitis: Secondary | ICD-10-CM | POA: Diagnosis not present

## 2015-04-29 HISTORY — DX: Presence of cardiac pacemaker: Z95.0

## 2015-04-29 HISTORY — DX: Cardiac arrhythmia, unspecified: I49.9

## 2015-04-29 LAB — CBC WITH DIFFERENTIAL/PLATELET
Basophils Absolute: 0 10*3/uL (ref 0.0–0.1)
Basophils Relative: 1 %
Eosinophils Absolute: 0.2 10*3/uL (ref 0.0–0.7)
Eosinophils Relative: 6 %
HCT: 34.9 % — ABNORMAL LOW (ref 36.0–46.0)
Hemoglobin: 11.4 g/dL — ABNORMAL LOW (ref 12.0–15.0)
Lymphocytes Relative: 34 %
Lymphs Abs: 1.3 10*3/uL (ref 0.7–4.0)
MCH: 28.6 pg (ref 26.0–34.0)
MCHC: 32.7 g/dL (ref 30.0–36.0)
MCV: 87.5 fL (ref 78.0–100.0)
Monocytes Absolute: 0.3 10*3/uL (ref 0.1–1.0)
Monocytes Relative: 7 %
Neutro Abs: 2 10*3/uL (ref 1.7–7.7)
Neutrophils Relative %: 52 %
Platelets: 183 10*3/uL (ref 150–400)
RBC: 3.99 MIL/uL (ref 3.87–5.11)
RDW: 14.2 % (ref 11.5–15.5)
WBC: 3.8 10*3/uL — ABNORMAL LOW (ref 4.0–10.5)

## 2015-04-29 LAB — COMPREHENSIVE METABOLIC PANEL
ALT: 13 U/L — ABNORMAL LOW (ref 14–54)
AST: 18 U/L (ref 15–41)
Albumin: 3.8 g/dL (ref 3.5–5.0)
Alkaline Phosphatase: 53 U/L (ref 38–126)
Anion gap: 4 — ABNORMAL LOW (ref 5–15)
BUN: 19 mg/dL (ref 6–20)
CO2: 26 mmol/L (ref 22–32)
Calcium: 9.1 mg/dL (ref 8.9–10.3)
Chloride: 111 mmol/L (ref 101–111)
Creatinine, Ser: 1.24 mg/dL — ABNORMAL HIGH (ref 0.44–1.00)
GFR calc Af Amer: 44 mL/min — ABNORMAL LOW (ref >60)
GFR calc non Af Amer: 38 mL/min — ABNORMAL LOW (ref >60)
Glucose, Bld: 86 mg/dL (ref 65–99)
Potassium: 4.5 mmol/L (ref 3.5–5.1)
Sodium: 141 mmol/L (ref 135–145)
Total Bilirubin: 1 mg/dL (ref 0.3–1.2)
Total Protein: 6.6 g/dL (ref 6.5–8.1)

## 2015-04-29 LAB — PROTIME-INR
INR: 1.71 — ABNORMAL HIGH (ref 0.00–1.49)
Prothrombin Time: 20 seconds — ABNORMAL HIGH (ref 11.6–15.2)

## 2015-04-29 LAB — APTT: aPTT: 30 seconds (ref 24–37)

## 2015-04-29 NOTE — Pre-Procedure Instructions (Signed)
    Amy White  04/29/2015      [id number] [place], [place] [address] [place] [address] Phone: [phone] Fax: [phone]  [id number] [place], [place] - [place] [id number] [place] [id number] Phone: [phone] Fax: [phone]    Your procedure is scheduled on 05/01/15.  Report to [facility] Admitting at 630 A.M.  Call this number if you have problems the morning of surgery:  [phone]   Remember:  Do not eat food or drink liquids after midnight.  Take these medicines the morning of surgery with A SIP OF WATER --all inhalers,xanax,diltiazem,synthroid   Do not wear jewelry, make-up or nail polish.  Do not wear lotions, powders, or perfumes.  You may wear deodorant.  Do not shave 48 hours prior to surgery.  Men may shave face and neck.  Do not bring valuables to the hospital.  [facility] is not responsible for any belongings or valuables.  Contacts, dentures or bridgework may not be worn into surgery.  Leave your suitcase in the car.  After surgery it may be brought to your room.  For patients admitted to the hospital, discharge time will be determined by your treatment team.  Patients discharged the day of surgery will not be allowed to drive home.   Name and phone number of your driver:    Special instructions:    Please read over the following fact sheets that you were given. Pain Booklet, Coughing and Deep Breathing and Surgical Site Infection Prevention

## 2015-04-29 NOTE — Progress Notes (Signed)
Pacemaker form faxed to Dr Sallyanne Kuster. Also coumadin was stopped on the 18th

## 2015-04-30 ENCOUNTER — Other Ambulatory Visit: Payer: Self-pay | Admitting: Cardiovascular Disease

## 2015-04-30 DIAGNOSIS — L820 Inflamed seborrheic keratosis: Secondary | ICD-10-CM | POA: Diagnosis not present

## 2015-04-30 MED ORDER — CLINDAMYCIN PHOSPHATE 300 MG/50ML IV SOLN
300.0000 mg | INTRAVENOUS | Status: AC
  Administered 2015-05-01: 300 mg via INTRAVENOUS
  Filled 2015-04-30: qty 50

## 2015-04-30 MED ORDER — CHLORHEXIDINE GLUCONATE 4 % EX LIQD: 1.0000 "application " | Freq: Once | CUTANEOUS | Status: DC

## 2015-04-30 NOTE — Anesthesia Preprocedure Evaluation (Addendum)
Anesthesia Evaluation  Patient identified by MRN, date of birth, ID band Patient awake    Reviewed: Allergy & Precautions, NPO status , Patient's Chart, lab work & pertinent test results  Airway Mallampati: III  TM Distance: >3 FB Neck ROM: Full  Mouth opening: Limited Mouth Opening  Dental  (+) Teeth Intact,    Pulmonary asthma , COPD,  COPD inhaler,    breath sounds clear to auscultation       Cardiovascular hypertension, Pt. on medications + dysrhythmias Atrial Fibrillation + pacemaker  Rhythm:Regular Rate:Normal  Sick Sinus Syndrome   Neuro/Psych PSYCHIATRIC DISORDERS Anxiety negative neurological ROS     GI/Hepatic Neg liver ROS, GERD  Medicated,  Endo/Other  negative endocrine ROSHyperthyroidism   Renal/GU negative Renal ROS  negative genitourinary   Musculoskeletal  (+) Arthritis ,   Abdominal   Peds  Hematology   Anesthesia Other Findings   Reproductive/Obstetrics negative OB ROS                           Lab Results  Component Value Date   WBC 3.8* 04/29/2015   HGB 11.4* 04/29/2015   HCT 34.9* 04/29/2015   MCV 87.5 04/29/2015   PLT 183 04/29/2015   Lab Results  Component Value Date   CREATININE 1.24* 04/29/2015   BUN 19 04/29/2015   NA 141 04/29/2015   K 4.5 04/29/2015   CL 111 04/29/2015   CO2 26 04/29/2015   Lab Results  Component Value Date   INR 1.71* 04/29/2015   INR 2.70* 04/07/2015   INR 2.4 03/20/2015   - Left ventricle: The cavity size was normal. Wall thickness was increased in a pattern of mild LVH. The estimated ejection fraction was 60%. Wall motion was normal; there were no regional wall motion abnormalities. - Aorta: Mild dilitation of sinus of valsalva. Aortic root dimension: 28 mm (ED). - Mitral valve: Calcified annulus. - Right ventricle: Poorly visualized. - Pericardium, extracardiac: A trivial pericardial effusion was identified  posterior to the heart.  Anesthesia Physical Anesthesia Plan  ASA: III  Anesthesia Plan: MAC   Post-op Pain Management:    Induction: Intravenous  Airway Management Planned: Natural Airway and Simple Face Mask  Additional Equipment:   Intra-op Plan:   Post-operative Plan:   Informed Consent: I have reviewed the patients History and Physical, chart, labs and discussed the procedure including the risks, benefits and alternatives for the proposed anesthesia with the patient or authorized representative who has indicated his/her understanding and acceptance.   Dental advisory given  Plan Discussed with: CRNA  Anesthesia Plan Comments: (Magnet in room. Procedure below umbilicus so may not need placement. )       Anesthesia Quick Evaluation

## 2015-04-30 NOTE — Progress Notes (Signed)
Anesthesia Chart Review: Patient is a 81 year old female scheduled for excision skin lesion, right groin on 05/01/15 by Dr. Brantley Stage. Case is posted for MAC anesthesia. Special needs indicates that patient will need magnet in OR due to PPM. PPM perioperative form is still pending from Dr. Lorenza Cambridge, but PPM is located right chest wall (above umbilicus). His clearance note indicates that she is not pacemaker dependent and "It is unlikely that the planned procedure will interfere with pacemaker function. Her device is a Medtronic Advisa MRI-conditional device, with normal function by last check in August 2016."  History includes left breast cancer s/p mastectomy '97 with metastatic breast cancer to bones '12, tachy-brady syndrome s/p Medtronic dual chamber PPM, PAF, HTN, COPD, hypothyroidism on levothyroxine, never smoker, asthma, hiatal hernia, arthritis.   PCP is Dr. Mayra Neer. Cardiologist is Dr. Sallyanne Kuster. Remote PPM transmission 04/11/15. He felt patient was low CV risk for upcoming surgery and felt warfain could be held 5-7 days preoperatively (see Letters tab). HEM-ONC is Dr. Marin Olp.   Meds include albuterol, Xanax, diltiazem, Femara, levothyroxine, lisinopril, Coumadin. Last warfarin dose pre-op 04/25/15. She is also on denosumab at the [facility], last dose 03/29/15.   01/08/15 EKG: Atrial paced, ventricular sensed rhythm (per Dr. Sallyanne Kuster). I don't see pacer spikes in the scanned tracing.  04/03/14-05/02/14 Holter monitor: PACs, 3-beat atrial tachycardia, postectopic pauses < 2 seconds.   03/30/14 Echo: Study Conclusions - Left ventricle: The cavity size was normal. Wall thickness was increased in a pattern of mild LVH. The estimated ejection fraction was 60%. Wall motion was normal; there were no regional wall motion abnormalities. - Aorta: Mild dilitation of sinus of valsalva. Aortic root dimension: 28 mm (ED). - Mitral valve: Calcified annulus. - Right ventricle: Poorly  visualized. - Pericardium, extracardiac: A trivial pericardial effusion was identified posterior to the heart.  Cardiology notes indicate she had a normal stress test in 2007.  09/12/14 CXR: IMPRESSION: COPD. There is no acute cardiopulmonary abnormality. No definite acute skeletal abnormality is demonstrated but there is known metastatic disease at the level of T12. Sclerosis in the anterior aspect of the right sixth rib may be related to the costal cartilage junction but metastatic disease here is not excluded. There are no definite acute bony lesions elsewhere. Repeat nuclear bone scanning may be useful.  Preoperative labs noted. Cr 1.24. H/H 11.4/34.9. PT/INR 20.0/1.71. PTT 30. Glucose 86. Plan to repeat PT/INR on arrival.   If no acute changes then I would anticipate that she could proceed as planned.   George Hugh [facility]/Anesthesiology Phone [phone] 04/30/2015 10:46 AM

## 2015-05-01 ENCOUNTER — Ambulatory Visit (HOSPITAL_COMMUNITY): Payer: Medicare Other | Admitting: Vascular Surgery

## 2015-05-01 ENCOUNTER — Ambulatory Visit: Payer: Medicare Other | Admitting: Pharmacist Clinician (PhC)/ Clinical Pharmacy Specialist

## 2015-05-01 ENCOUNTER — Encounter (HOSPITAL_COMMUNITY): Payer: Self-pay | Admitting: *Deleted

## 2015-05-01 ENCOUNTER — Ambulatory Visit (HOSPITAL_COMMUNITY)
Admission: RE | Admit: 2015-05-01 | Discharge: 2015-05-01 | Disposition: A | Discharge status: Home / Self Care | Payer: Medicare Other | Source: Ambulatory Visit | Attending: Surgery | Admitting: Surgery

## 2015-05-01 ENCOUNTER — Telehealth: Payer: Self-pay | Admitting: *Deleted

## 2015-05-01 ENCOUNTER — Encounter (HOSPITAL_COMMUNITY)
Admission: RE | Disposition: A | Discharge status: Home / Self Care | Payer: Self-pay | Source: Ambulatory Visit | Attending: Surgery

## 2015-05-01 DIAGNOSIS — K219 Gastro-esophageal reflux disease without esophagitis: Secondary | ICD-10-CM | POA: Insufficient documentation

## 2015-05-01 DIAGNOSIS — Z9012 Acquired absence of left breast and nipple: Secondary | ICD-10-CM | POA: Insufficient documentation

## 2015-05-01 DIAGNOSIS — J45909 Unspecified asthma, uncomplicated: Secondary | ICD-10-CM | POA: Insufficient documentation

## 2015-05-01 DIAGNOSIS — Z950 Presence of cardiac pacemaker: Secondary | ICD-10-CM | POA: Insufficient documentation

## 2015-05-01 DIAGNOSIS — Z7901 Long term (current) use of anticoagulants: Secondary | ICD-10-CM | POA: Insufficient documentation

## 2015-05-01 DIAGNOSIS — I10 Essential (primary) hypertension: Secondary | ICD-10-CM | POA: Diagnosis not present

## 2015-05-01 DIAGNOSIS — M1990 Unspecified osteoarthritis, unspecified site: Secondary | ICD-10-CM | POA: Diagnosis not present

## 2015-05-01 DIAGNOSIS — Z853 Personal history of malignant neoplasm of breast: Secondary | ICD-10-CM | POA: Insufficient documentation

## 2015-05-01 DIAGNOSIS — I4891 Unspecified atrial fibrillation: Secondary | ICD-10-CM | POA: Insufficient documentation

## 2015-05-01 DIAGNOSIS — Z85830 Personal history of malignant neoplasm of bone: Secondary | ICD-10-CM | POA: Insufficient documentation

## 2015-05-01 DIAGNOSIS — E0590 Thyrotoxicosis, unspecified without thyrotoxic crisis or storm: Secondary | ICD-10-CM | POA: Insufficient documentation

## 2015-05-01 DIAGNOSIS — Z79899 Other long term (current) drug therapy: Secondary | ICD-10-CM | POA: Insufficient documentation

## 2015-05-01 DIAGNOSIS — L820 Inflamed seborrheic keratosis: Secondary | ICD-10-CM | POA: Diagnosis not present

## 2015-05-01 DIAGNOSIS — J449 Chronic obstructive pulmonary disease, unspecified: Secondary | ICD-10-CM | POA: Insufficient documentation

## 2015-05-01 DIAGNOSIS — F419 Anxiety disorder, unspecified: Secondary | ICD-10-CM | POA: Insufficient documentation

## 2015-05-01 HISTORY — PX: LESION REMOVAL: SHX5196

## 2015-05-01 LAB — PROTIME-INR
INR: 1.21 (ref 0.00–1.49)
Prothrombin Time: 15.4 seconds — ABNORMAL HIGH (ref 11.6–15.2)

## 2015-05-01 SURGERY — WIDE EXCISION, LESION, UPPER EXTREMITY
Anesthesia: Monitor Anesthesia Care | Site: Groin | Laterality: Right

## 2015-05-01 MED ORDER — NEOSTIGMINE METHYLSULFATE 10 MG/10ML IV SOLN
INTRAVENOUS | Status: AC
  Filled 2015-05-01: qty 1

## 2015-05-01 MED ORDER — ROCURONIUM BROMIDE 50 MG/5ML IV SOLN
INTRAVENOUS | Status: AC
  Filled 2015-05-01: qty 1

## 2015-05-01 MED ORDER — PROPOFOL 10 MG/ML IV BOLUS
INTRAVENOUS | Status: AC
  Filled 2015-05-01: qty 20

## 2015-05-01 MED ORDER — FENTANYL CITRATE (PF) 100 MCG/2ML IJ SOLN: 25.0000 ug | INTRAMUSCULAR | Status: DC | PRN

## 2015-05-01 MED ORDER — FENTANYL CITRATE (PF) 250 MCG/5ML IJ SOLN
INTRAMUSCULAR | Status: AC
  Filled 2015-05-01: qty 5

## 2015-05-01 MED ORDER — GLYCOPYRROLATE 0.2 MG/ML IJ SOLN
INTRAMUSCULAR | Status: AC
  Filled 2015-05-01: qty 3

## 2015-05-01 MED ORDER — PROPOFOL 500 MG/50ML IV EMUL
INTRAVENOUS | Status: DC | PRN
  Administered 2015-05-01: 75 ug/kg/min via INTRAVENOUS

## 2015-05-01 MED ORDER — HYDROCODONE-ACETAMINOPHEN 5-325 MG PO TABS: 1.0000 | ORAL_TABLET | Freq: Four times a day (QID) | ORAL | Status: DC | PRN

## 2015-05-01 MED ORDER — BUPIVACAINE-EPINEPHRINE (PF) 0.25% -1:200000 IJ SOLN
INTRAMUSCULAR | Status: AC
  Filled 2015-05-01: qty 30

## 2015-05-01 MED ORDER — LACTATED RINGERS IV SOLN
INTRAVENOUS | Status: DC | PRN
  Administered 2015-05-01: 08:00:00 via INTRAVENOUS

## 2015-05-01 MED ORDER — ARTIFICIAL TEARS OP OINT
TOPICAL_OINTMENT | OPHTHALMIC | Status: AC
  Filled 2015-05-01: qty 3.5

## 2015-05-01 MED ORDER — 0.9 % SODIUM CHLORIDE (POUR BTL) OPTIME
TOPICAL | Status: DC | PRN
  Administered 2015-05-01: 1000 mL

## 2015-05-01 MED ORDER — MEPERIDINE HCL 25 MG/ML IJ SOLN: 6.2500 mg | INTRAMUSCULAR | Status: DC | PRN

## 2015-05-01 MED ORDER — FENTANYL CITRATE (PF) 100 MCG/2ML IJ SOLN
INTRAMUSCULAR | Status: DC | PRN
  Administered 2015-05-01: 50 ug via INTRAVENOUS

## 2015-05-01 MED ORDER — ONDANSETRON HCL 4 MG/2ML IJ SOLN
INTRAMUSCULAR | Status: AC
  Filled 2015-05-01: qty 2

## 2015-05-01 MED ORDER — LIDOCAINE HCL (CARDIAC) 20 MG/ML IV SOLN
INTRAVENOUS | Status: AC
  Filled 2015-05-01: qty 5

## 2015-05-01 MED ORDER — BUPIVACAINE-EPINEPHRINE 0.25% -1:200000 IJ SOLN
INTRAMUSCULAR | Status: DC | PRN
Start: 2015-05-01 — End: 2015-05-01
  Administered 2015-05-01: 20 mL

## 2015-05-01 MED ORDER — LIDOCAINE HCL (CARDIAC) 20 MG/ML IV SOLN
INTRAVENOUS | Status: DC | PRN
  Administered 2015-05-01: 60 mg via INTRAVENOUS

## 2015-05-01 MED ORDER — LACTATED RINGERS IV SOLN: INTRAVENOUS | Status: DC

## 2015-05-01 MED ORDER — PROMETHAZINE HCL 25 MG/ML IJ SOLN: 6.2500 mg | INTRAMUSCULAR | Status: DC | PRN

## 2015-05-01 SURGICAL SUPPLY — 40 items
BLADE SURG ROTATE 9660 (MISCELLANEOUS) ×1 IMPLANT
CHLORAPREP W/TINT 26ML (MISCELLANEOUS) ×1 IMPLANT
COVER SURGICAL LIGHT HANDLE (MISCELLANEOUS) ×2 IMPLANT
DECANTER SPIKE VIAL GLASS SM (MISCELLANEOUS) ×1 IMPLANT
DRAPE LAPAROSCOPIC ABDOMINAL (DRAPES) IMPLANT
DRAPE PED LAPAROTOMY (DRAPES) ×1 IMPLANT
DRAPE UTILITY XL STRL (DRAPES) ×3 IMPLANT
ELECT CAUTERY BLADE 6.4 (BLADE) ×2 IMPLANT
ELECT REM PT RETURN 9FT ADLT (ELECTROSURGICAL) ×2
ELECTRODE REM PT RTRN 9FT ADLT (ELECTROSURGICAL) ×1 IMPLANT
GLOVE BIO SURGEON STRL SZ8 (GLOVE) ×2 IMPLANT
GLOVE BIOGEL PI IND STRL 7.0 (GLOVE) IMPLANT
GLOVE BIOGEL PI IND STRL 8 (GLOVE) ×1 IMPLANT
GLOVE BIOGEL PI INDICATOR 7.0 (GLOVE) ×1
GLOVE BIOGEL PI INDICATOR 8 (GLOVE) ×1
GLOVE SURG SS PI 7.0 STRL IVOR (GLOVE) ×1 IMPLANT
GOWN STRL REUS W/ TWL LRG LVL3 (GOWN DISPOSABLE) ×2 IMPLANT
GOWN STRL REUS W/ TWL XL LVL3 (GOWN DISPOSABLE) ×1 IMPLANT
GOWN STRL REUS W/TWL LRG LVL3 (GOWN DISPOSABLE) ×4
GOWN STRL REUS W/TWL XL LVL3 (GOWN DISPOSABLE) ×2
KIT BASIN OR (CUSTOM PROCEDURE TRAY) ×2 IMPLANT
KIT ROOM TURNOVER OR (KITS) ×2 IMPLANT
LIQUID BAND (GAUZE/BANDAGES/DRESSINGS) ×2 IMPLANT
NDL HYPO 25GX1X1/2 BEV (NEEDLE) ×1 IMPLANT
NEEDLE HYPO 25GX1X1/2 BEV (NEEDLE) ×2 IMPLANT
NS IRRIG 1000ML POUR BTL (IV SOLUTION) ×2 IMPLANT
PACK SURGICAL SETUP 50X90 (CUSTOM PROCEDURE TRAY) ×2 IMPLANT
PAD ARMBOARD 7.5X6 YLW CONV (MISCELLANEOUS) ×2 IMPLANT
PENCIL BUTTON HOLSTER BLD 10FT (ELECTRODE) ×2 IMPLANT
SPECIMEN JAR MEDIUM (MISCELLANEOUS) IMPLANT
SPECIMEN JAR SMALL (MISCELLANEOUS) ×1 IMPLANT
SPONGE LAP 18X18 X RAY DECT (DISPOSABLE) ×2 IMPLANT
SUT MNCRL AB 4-0 PS2 18 (SUTURE) ×2 IMPLANT
SUT VIC AB 2-0 SH 27 (SUTURE) ×2
SUT VIC AB 2-0 SH 27X BRD (SUTURE) ×1 IMPLANT
SUT VIC AB 3-0 SH 27 (SUTURE) ×2
SUT VIC AB 3-0 SH 27XBRD (SUTURE) ×1 IMPLANT
SYR CONTROL 10ML LL (SYRINGE) ×2 IMPLANT
TOWEL OR 17X24 6PK STRL BLUE (TOWEL DISPOSABLE) ×1 IMPLANT
TOWEL OR 17X26 10 PK STRL BLUE (TOWEL DISPOSABLE) ×2 IMPLANT

## 2015-05-01 NOTE — Interval H&P Note (Signed)
History and Physical Interval Note:  05/01/2015 7:52 AM  Amy White  has presented today for surgery, with the diagnosis of right groin skin lesion  The various methods of treatment have been discussed with the patient and family. After consideration of risks, benefits and other options for treatment, the patient has consented to  Procedure(s): EXCISION SKIN LESION RIGHT GROIN  (Right) as a surgical intervention .  The patient's history has been reviewed, patient examined, no change in status, stable for surgery.  I have reviewed the patient's chart and labs.  Questions were answered to the patient's satisfaction.     Antonella Upson A.

## 2015-05-01 NOTE — Anesthesia Procedure Notes (Signed)
Procedure Name: MAC Date/Time: 05/01/2015 8:35 AM Performed by: Lowella Dell Pre-anesthesia Checklist: Patient identified, Timeout performed, Emergency Drugs available, Suction available and Patient being monitored Patient Re-evaluated:Patient Re-evaluated prior to inductionOxygen Delivery Method: Nasal cannula Intubation Type: IV induction Dental Injury: Teeth and Oropharynx as per pre-operative assessment

## 2015-05-01 NOTE — Op Note (Signed)
Preoperative diagnosis: 5 cm x 5 cm right groin skin lesion  Postoperative diagnosis: Same  Procedure: Excision of 5 cm x 5 cm right groin skin lesion  Surgeon: Erroll Luna M.D.  Anesthesia: Mac 0.25% Sensorcaine local with epinephrine  EBL: Minimal  Specimen: Right groin skin lesion  Indications for procedure: The patient is a 81-year-old female with a 5 cm ulcerated raised pigmented skin lesion in the right groin. She is on anticoagulations and this continues to bleed. She desires excision.The procedure has been discussed with the patient.  Alternative therapies have been discussed with the patient.  Operative risks include bleeding,  Infection,  Organ injury,  Nerve injury,  Blood vessel injury,  DVT,  Pulmonary embolism,  Death,  And possible reoperation.  Medical management risks include worsening of present situation.  The success of the procedure is 50 -90 % at treating patients symptoms.  The patient understands and agrees to proceed.  Description of procedure: The patient was brought to the operative room for the holding area. She was seen in the holding area and right groin was marked as correct side. Questions were answered. She was placed upon the OR table. After induction of Mac anesthesia the right groin was prepped and draped in sterile fashion. Timeout was done. 0.25% Sensorcaine was infiltrated under the lesion. Curvilinear incision around the lesion was done to grossly negative margins. This was taken down to the subcutaneous fat. We was closed with 2-0 Vicryl and 4-0 Monocryl. Liquid adhesive applied. All final counts found to be correct. Patient taken recovery in satisfactory condition.

## 2015-05-01 NOTE — Anesthesia Postprocedure Evaluation (Signed)
Anesthesia Post Note  Patient: Amy White  Procedure(s) Performed: Procedure(s) (LRB): EXCISION RIGHT GROIN SKIN LESION (Right)  Patient location during evaluation: PACU Anesthesia Type: MAC Level of consciousness: awake and alert Pain management: pain level controlled Vital Signs Assessment: post-procedure vital signs reviewed and stable Respiratory status: spontaneous breathing, nonlabored ventilation, respiratory function stable and patient connected to nasal cannula oxygen Cardiovascular status: stable and blood pressure returned to baseline Anesthetic complications: no    Last Vitals:  Filed Vitals:   05/01/15 1000 05/01/15 1019  BP: 104/51 120/54  Pulse: 59 64  Temp: 36.7 C   Resp: 16 14    Last Pain:  Filed Vitals:   05/01/15 1021  PainSc: 0-No pain                 Effie Berkshire

## 2015-05-01 NOTE — Telephone Encounter (Signed)
Signed perioperative Rx for implanted cardiac device programming.  Procedure should not interfere with device function.

## 2015-05-01 NOTE — Discharge Instructions (Signed)
GENERAL SURGERY: POST OP INSTRUCTIONS ° °1. DIET: Follow a light bland diet the first 24 hours after arrival home, such as soup, liquids, crackers, etc.  Be sure to include lots of fluids daily.  Avoid fast food or heavy meals as your are more likely to get nauseated.   °2. Take your usually prescribed home medications unless otherwise directed. °3. PAIN CONTROL: °a. Pain is best controlled by a usual combination of three different methods TOGETHER: °i. Ice/Heat °ii. Over the counter pain medication °iii. Prescription pain medication °b. Most patients will experience some swelling and bruising around the incisions.  Ice packs or heating pads (30-60 minutes up to 6 times a day) will help. Use ice for the first few days to help decrease swelling and bruising, then switch to heat to help relax tight/sore spots and speed recovery.  Some people prefer to use ice alone, heat alone, alternating between ice & heat.  Experiment to what works for you.  Swelling and bruising can take several weeks to resolve.   °c. It is helpful to take an over-the-counter pain medication regularly for the first few weeks.  Choose one of the following that works best for you: °i. Naproxen (Aleve, etc)  Two 220mg tabs twice a day °ii. Ibuprofen (Advil, etc) Three 200mg tabs four times a day (every meal & bedtime) °iii. Acetaminophen (Tylenol, etc) 500-650mg four times a day (every meal & bedtime) °d. A  prescription for pain medication (such as oxycodone, hydrocodone, etc) should be given to you upon discharge.  Take your pain medication as prescribed.  °i. If you are having problems/concerns with the prescription medicine (does not control pain, nausea, vomiting, rash, itching, etc), please call us [phone] to see if we need to switch you to a different pain medicine that will work better for you and/or control your side effect better. °ii. If you need a refill on your pain medication, please contact your pharmacy.  They will contact our  office to request authorization. Prescriptions will not be filled after 5 pm or on week-ends. °4. Avoid getting constipated.  Between the surgery and the pain medications, it is common to experience some constipation.  Increasing fluid intake and taking a fiber supplement (such as Metamucil, Citrucel, FiberCon, MiraLax, etc) 1-2 times a day regularly will usually help prevent this problem from occurring.  A mild laxative (prune juice, Milk of Magnesia, MiraLax, etc) should be taken according to package directions if there are no bowel movements after 48 hours.   °5. Wash / shower every day.  You may shower over the dressings as they are waterproof.  Continue to shower over incision(s) after the dressing is off. °6. Remove your waterproof bandages 5 days after surgery.  You may leave the incision open to air.  You may have skin tapes (Steri Strips) covering the incision(s).  Leave them on until one week, then remove.  You may replace a dressing/Band-Aid to cover the incision for comfort if you wish.  ° ° ° ° °7. ACTIVITIES as tolerated:   °a. You may resume regular (light) daily activities beginning the next day--such as daily self-care, walking, climbing stairs--gradually increasing activities as tolerated.  If you can walk 30 minutes without difficulty, it is safe to try more intense activity such as jogging, treadmill, bicycling, low-impact aerobics, swimming, etc. °b. Save the most intensive and strenuous activity for last such as sit-ups, heavy lifting, contact sports, etc  Refrain from any heavy lifting or straining until you   are off narcotics for pain control.   °c. DO NOT PUSH THROUGH PAIN.  Let pain be your guide: If it hurts to do something, don't do it.  Pain is your body warning you to avoid that activity for another week until the pain goes down. °d. You may drive when you are no longer taking prescription pain medication, you can comfortably wear a seatbelt, and you can safely maneuver your car and  apply brakes. °e. You may have sexual intercourse when it is comfortable.  °8. FOLLOW UP in our office °a. Please call [facility] at [phone] to set up an appointment to see your surgeon in the office for a follow-up appointment approximately 2-3 weeks after your surgery. °b. Make sure that you call for this appointment the day you arrive home to insure a convenient appointment time. °9. IF YOU HAVE DISABILITY OR FAMILY LEAVE FORMS, BRING THEM TO THE OFFICE FOR PROCESSING.  DO NOT GIVE THEM TO YOUR DOCTOR. ° ° °WHEN TO CALL US [phone]: °1. Poor pain control °2. Reactions / problems with new medications (rash/itching, nausea, etc)  °3. Fever over 101.5 F (38.5 C) °4. Worsening swelling or bruising °5. Continued bleeding from incision. °6. Increased pain, redness, or drainage from the incision °7. Difficulty breathing / swallowing ° ° The clinic staff is available to answer your questions during regular business hours (8:30am-5pm).  Please don’t hesitate to call and ask to speak to one of our nurses for clinical concerns.  ° If you have a medical emergency, go to the nearest emergency room or call 911. ° A surgeon from [facility] is always on call at the hospitals ° ° °[facility] °[address], [address], [place], [place]  [address] ? °MAIN: [phone] ? TOLL FREE: [phone] ?  °FAX [phone] °[email or web address] ° °

## 2015-05-01 NOTE — H&P (View-Only) (Signed)
Amy White 04/01/2015 11:12 AM Location: [facility] Patient #: [id number] DOB: 06-19-1925 Single / Language: Amy White / Race: White Female  History of Present Illness Amy Moores A. Amy Coppedge MD; 04/01/2015 12:52 PM) Patient words: Patient sent at the request of Amy White for skin lesion in her right groin. It has been present for many years and is getting larger. It does bleed from time to time. It is pigmented. Causes mild discomfort when he catches her clothing.  The patient is a 81 year old female.   Other Problems Amy White; 04/01/2015 11:12 AM) Arthritis Asthma Atrial Fibrillation Back Pain Breast Cancer Cancer Cholelithiasis Chronic Obstructive Lung Disease Emphysema Of Lung Hemorrhoids High blood pressure Thyroid Disease Vascular Disease  Past Surgical History Amy White; 04/01/2015 11:12 AM) Breast Biopsy Left. Colon Polyp Removal - Colonoscopy Colon Polyp Removal - Open Foot Surgery Left. Mastectomy Left. Tonsillectomy  Diagnostic Studies History Amy White; 04/01/2015 11:12 AM) Colonoscopy 5-10 years ago Pap Smear >5 years ago  Allergies Amy White; 04/01/2015 11:13 AM) Crestor *ANTIHYPERLIPIDEMICS* Ciprofloxacin *CHEMICALS* Penicillin V *PENICILLINS* Hives, Rash. Vancomycin HCl *Anti-infective Agents - Misc.** Rash.  Medication History Amy White; 04/01/2015 11:15 AM) ALPRAZolam (0.5MG  Tablet, Oral) Active. Diclofenac Sodium (1% Gel, Transdermal) Active. DiltiaZEM HCl ER Coated Beads (240MG  Capsule ER 24HR, Oral) Active. Vitamin D (50000U Tablet, Oral) Active. Letrozole (2.5MG  Tablet, Oral) Active. Levothyroxine Sodium (75MCG Tablet, Oral) Active. Lisinopril (10MG  Tablet, Oral) Active. Warfarin Sodium (5MG  Tablet, Oral) Active. Visine Extra (0.05% Solution, Ophthalmic) Active. Medications Reconciled  Pregnancy / Birth History Amy White; 04/01/2015  11:12 AM) Age at menarche 3 years. Age of menopause 32-60 Gravida 1 Maternal age 72-30 Para 1     Review of Systems Amy White; 04/01/2015 11:12 AM) General Present- Fatigue and Weight Gain. Not Present- Appetite Loss, Chills, Fever, Night Sweats and Weight Loss. Skin Present- New Lesions. Not Present- Change in Wart/Mole, Dryness, Hives, Jaundice, Non-Healing Wounds, Rash and Ulcer. HEENT Present- Hearing Loss, Ringing in the Ears and Wears glasses/contact lenses. Not Present- Earache, Hoarseness, Nose Bleed, Oral Ulcers, Seasonal Allergies, Sinus Pain, Sore Throat, Visual Disturbances and Yellow Eyes. Respiratory Present- Difficulty Breathing and Wheezing. Not Present- Bloody sputum, Chronic Cough and Snoring. Breast Present- Breast Pain. Not Present- Breast Mass, Nipple Discharge and Skin Changes. Cardiovascular Present- Leg Cramps, Palpitations, Shortness of Breath and Swelling of Extremities. Not Present- Chest Pain, Difficulty Breathing Lying Down and Rapid Heart Rate. Gastrointestinal Present- Bloating, Difficulty Swallowing and Hemorrhoids. Not Present- Abdominal Pain, Bloody Stool, Change in Bowel Habits, Chronic diarrhea, Constipation, Excessive gas, Gets full quickly at meals, Indigestion, Nausea, Rectal Pain and Vomiting. Musculoskeletal Present- Back Pain, Joint Stiffness, Muscle Weakness and Swelling of Extremities. Not Present- Joint Pain and Muscle Pain. Neurological Present- Tingling, Trouble walking and Weakness. Not Present- Decreased Memory, Fainting, Headaches, Numbness, Seizures and Tremor. Psychiatric Present- Anxiety. Not Present- Bipolar, Change in Sleep Pattern, Depression, Fearful and Frequent crying. Endocrine Not Present- Cold Intolerance, Excessive Hunger, Hair Changes, Heat Intolerance, Hot flashes and New Diabetes. Hematology Not Present- Easy Bruising, Excessive bleeding, Gland problems, HIV and Persistent Infections.  Vitals Amy White;  04/01/2015 11:15 AM) 04/01/2015 11:15 AM Weight: 176 lb Height: 67in Body Surface Area: 1.92 m Body Mass Index: 27.57 kg/m  Temp.: 97.44F(Temporal)  Pulse: 60 (Regular)  BP: 128/70 (Sitting, Left Arm, Standard)      Physical Exam (Amy Fragoso A. Amy Mcnally MD; 04/01/2015 12:53 PM)  General Mental Status-Alert. General Appearance-Consistent with stated age. Hydration-Well  hydrated. Voice-Normal.  Integumentary Note: 5 cm slightly pigmented lesion right groin over the inguinal canal with some ulceration centrally. Borders are regular. It is raised.  Eye Eyeball - Bilateral-Extraocular movements intact. Sclera/Conjunctiva - Bilateral-No scleral icterus.  Chest and Lung Exam Chest and lung exam reveals -quiet, even and easy respiratory effort with no use of accessory muscles and on auscultation, normal breath sounds, no adventitious sounds and normal vocal resonance. Inspection Chest Wall - Normal. Back - normal. Note: Pacemaker noted right chest.  Cardiovascular Cardiovascular examination reveals -normal heart sounds, regular rate and rhythm with no murmurs and normal pedal pulses bilaterally.  Neurologic Neurologic evaluation reveals -alert and oriented x 3 with no impairment of recent or remote memory. Mental Status-Normal.  Musculoskeletal Normal Exam - Left-Upper Extremity Strength Normal and Lower Extremity Strength Normal. Normal Exam - Right-Upper Extremity Strength Normal and Lower Extremity Strength Normal. Note: Left ankle scar noted.    Assessment & Plan (Amy Markwood A. Amy Cordy MD; 04/01/2015 12:54 PM)  BLEEDING PIGMENTED SKIN LESION (D22.9) Impression: right groin raised ulcerated skin lesion 5 cm needs excision but is 5 cm and will neeed to be done in the operating room needs cardiac clearance and pacemaker issues addressed hold coumadin 5 days before if ok with cardiology Risk of bleeding, infection, cardiac event, the need  for more surgery, damage to nearby nerves causing disability, blood vessel injury, wound infection, bleeding, death, DVT, and the need for other procedures and treatments. Patient agrees to proceed.  Current Plans The anatomy and the physiology was discussed. The pathophysiology and natural history of the disease was discussed. Options were discussed and recommendations were made. Technique, risks, benefits, & alternatives were discussed. Risks such as stroke, heart attack, bleeding, indection, death, and other risks discussed. Questions answered. The patient agrees to proceed. The pathophysiology of skin & subcutaneous masses was discussed. Natural history risks without surgery were discussed. I recommended surgery to remove the mass. I explained the technique of removal with use of local anesthesia & possible need for more aggressive sedation/anesthesia for patient comfort.  Risks such as bleeding, infection, wound breakdown, heart attack, death, and other risks were discussed. I noted a good likelihood this will help address the problem. Possibility that this will not correct all symptoms was explained. Possibility of regrowth/recurrence of the mass was discussed. We will work to minimize complications. Questions were answered. The patient expresses understanding & wishes to proceed with surgery.  Pt Education - [facility] Free Text Education/Instructions: discussed with patient and provided information.

## 2015-05-01 NOTE — Transfer of Care (Signed)
Immediate Anesthesia Transfer of Care Note  Patient: Amy White  Procedure(s) Performed: Procedure(s): EXCISION RIGHT GROIN SKIN LESION (Right)  Patient Location: PACU  Anesthesia Type:MAC  Level of Consciousness: awake, alert , oriented and patient cooperative  Airway & Oxygen Therapy: Patient Spontanous Breathing  Post-op Assessment: Report given to RN and Post -op Vital signs reviewed and stable  Post vital signs: Reviewed and stable  Last Vitals:  Filed Vitals:   05/01/15 0712  BP: 124/47  Pulse: 62  Temp: 36.1 C  Resp: 18    Complications: No apparent anesthesia complications

## 2015-05-01 NOTE — Progress Notes (Signed)
Late entry: Report given to Beverly Gust, RN at [employer]

## 2015-05-01 NOTE — Progress Notes (Signed)
Patient reports intermittent left sided chest pain that she describes as a nagging ache that does not have a pattern for onset. Patient reports that it last 4-5 minutes this morning. Patient reports that she has not had this pain since her mastectomy. Patient denies change with palpation or inspiration. Of note she reports that her bone scan had an area of concern near where she is having the pain. Dr. Tobias Alexander notified no further orders given.

## 2015-05-06 ENCOUNTER — Encounter (HOSPITAL_COMMUNITY): Payer: Medicare Other

## 2015-05-06 ENCOUNTER — Encounter (HOSPITAL_COMMUNITY): Payer: Self-pay | Admitting: Surgery

## 2015-05-06 ENCOUNTER — Encounter (HOSPITAL_COMMUNITY): Admission: RE | Admit: 2015-05-06 | Payer: Medicare Other | Source: Ambulatory Visit

## 2015-05-09 ENCOUNTER — Other Ambulatory Visit: Payer: Medicare Other

## 2015-05-09 ENCOUNTER — Ambulatory Visit: Payer: Medicare Other | Admitting: Hematology & Oncology

## 2015-05-12 ENCOUNTER — Other Ambulatory Visit: Payer: Self-pay | Admitting: Hematology & Oncology

## 2015-05-13 ENCOUNTER — Other Ambulatory Visit: Payer: Self-pay | Admitting: *Deleted

## 2015-05-15 ENCOUNTER — Ambulatory Visit: Payer: Medicare Other | Admitting: Hematology & Oncology

## 2015-05-15 ENCOUNTER — Other Ambulatory Visit: Payer: Medicare Other

## 2015-05-21 ENCOUNTER — Encounter: Payer: Self-pay | Admitting: Hematology & Oncology

## 2015-05-28 ENCOUNTER — Telehealth: Payer: Self-pay | Admitting: Cardiovascular Disease

## 2015-05-28 DIAGNOSIS — R609 Edema, unspecified: Secondary | ICD-10-CM

## 2015-05-28 NOTE — Telephone Encounter (Signed)
I agree with that advice, if she needs to take it for more than 3 days, will need to check labs.

## 2015-05-28 NOTE — Telephone Encounter (Signed)
Returned call to patient.  She notes ankle/foot swelling. SOB, fatigued on ambulation. Notes reduced UOP. She states oncologist started her on Vitamin D ~1 month ago - around the same time she noted this onset of swelling.  She does note that she was told there was no concern for Vit-D contributing to the swelling.   Upon further discussion, she does elicit that she was given a fluid pill rx - reviewed chart, and I saw that she'd been instructed at end of September on use of furosemide 40mg  Daily PRN for 3-5 days for a similar episode. She had been given #30 tablets - used 5 of them at end of [facility] Oct, no further use since then.  As Dr. Sallyanne Kuster had given her PRN instruction for her furosemide 40mg , I re-advised patient on the use of this medication. I recommended potassium rich foods to supplement her routine diet for next 3 days, for her to take a furosemide tablet now (w/ knowledge that she usually goes to bed around midnight) and repeat tomorrow morning and Thursday morning. Instructed on usual duration of med. Advised to keep legs elevated as much as possible. Instructed patient to call Friday morning with an update on her condition - advised may be OK to continue this med for an additional day or two, but would want to see how she improves.  Pt aware to call sooner if new/changing concerns. Pt aware I will forward to Dr. Sallyanne Kuster for any additional recommendations. She was very thankful for the call and expressed understanding of advice.

## 2015-05-28 NOTE — Telephone Encounter (Signed)
Pt says her feet and ankles are swelling real bad,can not put on shoes on.

## 2015-05-30 ENCOUNTER — Ambulatory Visit (INDEPENDENT_AMBULATORY_CARE_PROVIDER_SITE_OTHER): Payer: Medicare Other | Admitting: Pharmacist Clinician (PhC)/ Clinical Pharmacy Specialist

## 2015-05-30 DIAGNOSIS — Z7901 Long term (current) use of anticoagulants: Secondary | ICD-10-CM | POA: Diagnosis not present

## 2015-05-30 DIAGNOSIS — I4891 Unspecified atrial fibrillation: Secondary | ICD-10-CM

## 2015-05-30 DIAGNOSIS — I481 Persistent atrial fibrillation: Secondary | ICD-10-CM | POA: Diagnosis not present

## 2015-05-30 DIAGNOSIS — I4819 Other persistent atrial fibrillation: Secondary | ICD-10-CM

## 2015-05-30 LAB — POCT INR: INR: 2.4

## 2015-05-31 NOTE — Telephone Encounter (Signed)
Spoke with pt, she has taken the furosemide for the last 3 to 4 days. She still has swelling in her legs by the end of the day. She will come to the lab first of next week for bmp.

## 2015-05-31 NOTE — Telephone Encounter (Signed)
Returning your call. °

## 2015-05-31 NOTE — Telephone Encounter (Signed)
Left message for pt to call.

## 2015-06-06 LAB — BASIC METABOLIC PANEL
BUN: 21 mg/dL (ref 7–25)
CO2: 26 mmol/L (ref 20–31)
Calcium: 9.1 mg/dL (ref 8.6–10.4)
Chloride: 110 mmol/L (ref 98–110)
Creat: 1.11 mg/dL — ABNORMAL HIGH (ref 0.60–0.88)
Glucose, Bld: 82 mg/dL (ref 65–99)
Potassium: 4.5 mmol/L (ref 3.5–5.3)
Sodium: 144 mmol/L (ref 135–146)

## 2015-06-13 ENCOUNTER — Other Ambulatory Visit: Payer: Self-pay | Admitting: Hematology & Oncology

## 2015-06-13 ENCOUNTER — Telehealth: Payer: Self-pay | Admitting: Pharmacist Clinician (PhC)/ Clinical Pharmacy Specialist

## 2015-06-14 NOTE — Telephone Encounter (Signed)
Close encounter 

## 2015-07-10 ENCOUNTER — Ambulatory Visit (INDEPENDENT_AMBULATORY_CARE_PROVIDER_SITE_OTHER): Payer: Medicare Other | Admitting: Pharmacist Clinician (PhC)/ Clinical Pharmacy Specialist

## 2015-07-10 DIAGNOSIS — Z7901 Long term (current) use of anticoagulants: Secondary | ICD-10-CM | POA: Diagnosis not present

## 2015-07-10 DIAGNOSIS — I4891 Unspecified atrial fibrillation: Secondary | ICD-10-CM

## 2015-07-10 DIAGNOSIS — I481 Persistent atrial fibrillation: Secondary | ICD-10-CM

## 2015-07-10 DIAGNOSIS — I4819 Other persistent atrial fibrillation: Secondary | ICD-10-CM

## 2015-07-10 LAB — POCT INR: INR: 2.2

## 2015-07-18 ENCOUNTER — Other Ambulatory Visit: Payer: Self-pay | Admitting: Hematology & Oncology

## 2015-07-19 ENCOUNTER — Encounter (HOSPITAL_COMMUNITY)
Admission: RE | Admit: 2015-07-19 | Discharge: 2015-07-19 | Disposition: A | Discharge status: Home / Self Care | Payer: Medicare Other | Source: Ambulatory Visit | Attending: Hematology & Oncology | Admitting: Hematology & Oncology

## 2015-07-19 DIAGNOSIS — D508 Other iron deficiency anemias: Secondary | ICD-10-CM | POA: Insufficient documentation

## 2015-07-19 DIAGNOSIS — I481 Persistent atrial fibrillation: Secondary | ICD-10-CM | POA: Insufficient documentation

## 2015-07-19 DIAGNOSIS — N183 Chronic kidney disease, stage 3 unspecified: Secondary | ICD-10-CM

## 2015-07-19 DIAGNOSIS — C7951 Secondary malignant neoplasm of bone: Secondary | ICD-10-CM | POA: Diagnosis not present

## 2015-07-19 DIAGNOSIS — D631 Anemia in chronic kidney disease: Secondary | ICD-10-CM | POA: Insufficient documentation

## 2015-07-19 DIAGNOSIS — C50919 Malignant neoplasm of unspecified site of unspecified female breast: Secondary | ICD-10-CM | POA: Diagnosis not present

## 2015-07-19 DIAGNOSIS — C50911 Malignant neoplasm of unspecified site of right female breast: Secondary | ICD-10-CM | POA: Diagnosis not present

## 2015-07-19 DIAGNOSIS — I4819 Other persistent atrial fibrillation: Secondary | ICD-10-CM

## 2015-07-19 MED ORDER — TECHNETIUM TC 99M MEDRONATE IV KIT
25.0000 | PACK | Freq: Once | INTRAVENOUS | Status: AC | PRN
  Administered 2015-07-19: 24.5 via INTRAVENOUS

## 2015-07-22 ENCOUNTER — Telehealth: Payer: Self-pay | Admitting: *Deleted

## 2015-07-22 NOTE — Telephone Encounter (Addendum)
Patient is aware of results  ----- Message from Volanda Napoleon, MD sent at 07/19/2015  4:48 PM EST ----- Call - the bone scan looks great!!!  No new lesions seen!!!!  This is fantastic!!!  pete

## 2015-07-31 ENCOUNTER — Ambulatory Visit (HOSPITAL_BASED_OUTPATIENT_CLINIC_OR_DEPARTMENT_OTHER): Payer: Medicare Other | Admitting: Hematology & Oncology

## 2015-07-31 ENCOUNTER — Other Ambulatory Visit (HOSPITAL_BASED_OUTPATIENT_CLINIC_OR_DEPARTMENT_OTHER): Payer: Medicare Other

## 2015-07-31 ENCOUNTER — Encounter: Payer: Self-pay | Admitting: Hematology & Oncology

## 2015-07-31 VITALS — BP 142/65 | HR 86 | Temp 97.8°F | Resp 16 | Ht 68.0 in | Wt 176.0 lb

## 2015-07-31 DIAGNOSIS — C50911 Malignant neoplasm of unspecified site of right female breast: Secondary | ICD-10-CM

## 2015-07-31 DIAGNOSIS — I4819 Other persistent atrial fibrillation: Secondary | ICD-10-CM

## 2015-07-31 DIAGNOSIS — C7951 Secondary malignant neoplasm of bone: Principal | ICD-10-CM

## 2015-07-31 DIAGNOSIS — D631 Anemia in chronic kidney disease: Secondary | ICD-10-CM

## 2015-07-31 DIAGNOSIS — N183 Chronic kidney disease, stage 3 unspecified: Secondary | ICD-10-CM

## 2015-07-31 DIAGNOSIS — C50919 Malignant neoplasm of unspecified site of unspecified female breast: Secondary | ICD-10-CM

## 2015-07-31 DIAGNOSIS — I481 Persistent atrial fibrillation: Secondary | ICD-10-CM | POA: Diagnosis not present

## 2015-07-31 DIAGNOSIS — D508 Other iron deficiency anemias: Secondary | ICD-10-CM | POA: Diagnosis not present

## 2015-07-31 LAB — COMPREHENSIVE METABOLIC PANEL
ALT: 15 U/L (ref 0–55)
AST: 18 U/L (ref 5–34)
Albumin: 3.9 g/dL (ref 3.5–5.0)
Alkaline Phosphatase: 57 U/L (ref 40–150)
Anion Gap: 9 mEq/L (ref 3–11)
BUN: 23.3 mg/dL (ref 7.0–26.0)
CO2: 23 mEq/L (ref 22–29)
Calcium: 9.3 mg/dL (ref 8.4–10.4)
Chloride: 111 mEq/L — ABNORMAL HIGH (ref 98–109)
Creatinine: 1.1 mg/dL (ref 0.6–1.1)
EGFR: 43 mL/min/{1.73_m2} — ABNORMAL LOW (ref >90)
Glucose: 89 mg/dl (ref 70–140)
Potassium: 5.1 mEq/L (ref 3.5–5.1)
Sodium: 142 mEq/L (ref 136–145)
Total Bilirubin: 0.96 mg/dL (ref 0.20–1.20)
Total Protein: 6.9 g/dL (ref 6.4–8.3)

## 2015-07-31 LAB — CBC WITH DIFFERENTIAL (CANCER CENTER ONLY)
BASO#: 0 10*3/uL (ref 0.0–0.2)
BASO%: 0.5 % (ref 0.0–2.0)
EOS%: 4.8 % (ref 0.0–7.0)
Eosinophils Absolute: 0.2 10*3/uL (ref 0.0–0.5)
HCT: 36.1 % (ref 34.8–46.6)
HGB: 11.8 g/dL (ref 11.6–15.9)
LYMPH#: 1.2 10*3/uL (ref 0.9–3.3)
LYMPH%: 28.6 % (ref 14.0–48.0)
MCH: 27.9 pg (ref 26.0–34.0)
MCHC: 32.7 g/dL (ref 32.0–36.0)
MCV: 85 fL (ref 81–101)
MONO#: 0.5 10*3/uL (ref 0.1–0.9)
MONO%: 10.7 % (ref 0.0–13.0)
NEUT#: 2.3 10*3/uL (ref 1.5–6.5)
NEUT%: 55.4 % (ref 39.6–80.0)
Platelets: 187 10*3/uL (ref 145–400)
RBC: 4.23 10*6/uL (ref 3.70–5.32)
RDW: 14 % (ref 11.1–15.7)
WBC: 4.2 10*3/uL (ref 3.9–10.0)

## 2015-07-31 LAB — CHCC SATELLITE - SMEAR

## 2015-07-31 NOTE — Progress Notes (Signed)
Hematology and Oncology Follow Up Visit  Amy White [id number] 1926-04-28 82 y.o. 07/31/2015   Principle Diagnosis:  Metastatic breast cancer, bone metastasis only.  Current Therapy:   1. Femara 2.5 mg p.o. daily. 2. Delton See 120mg  sq q 3 month     Interim History:  Ms.  White is for followup. She feels pretty good. She's had no problems with bone pain. She did have a keratosis in the right inguinal crease. This is been removed by surgery.   We did do a CA 27.29 on her with her last visit. It was up a little bit at 63. We'll see what it is this time.  Her heart has been doing okay. She does have a pacemaker in. She is on Coumadin.  We did do a bone scan on her. The bone scan did not show any new disease. She still has some changes at T12. Otherwise, no other problems are identified.  This not been any change in bowel or bladder habits.  Her appetite is doing okay. She is gaining some weight. She is not too happy about this.  Overall, her performance status is [name] Medications:  Current outpatient prescriptions:  .  albuterol (PROVENTIL HFA;VENTOLIN HFA) 108 (90 BASE) MCG/ACT inhaler, Inhale 2 puffs into the lungs every 6 (six) hours as needed for wheezing or shortness of breath., Disp: , Rfl:  .  ALPRAZolam (XANAX) 0.5 MG tablet, Take 0.25 mg by mouth 2 (two) times daily as needed for anxiety. , Disp: , Rfl: 0 .  diclofenac sodium (VOLTAREN) 1 % GEL, Apply 1 application topically daily as needed (pain). , Disp: , Rfl: 1 .  diltiazem (CARDIZEM CD) 240 MG 24 hr capsule, Take 1 capsule (240 mg total) by mouth daily., Disp: 30 capsule, Rfl: 6 .  ergocalciferol (VITAMIN D2) 50000 UNITS capsule, Take 1 capsule (50,000 Units total) by mouth once a week., Disp: 4 capsule, Rfl: 11 .  HYDROcodone-acetaminophen (NORCO) 5-325 MG tablet, Take 1 tablet by mouth every 6 (six) hours as needed for moderate pain., Disp: 20 tablet, Rfl: 0 .  letrozole (FEMARA) 2.5 MG tablet, TAKE 1 TABLET BY  MOUTH EVERY DAY, Disp: 30 tablet, Rfl: 0 .  levothyroxine (SYNTHROID, LEVOTHROID) 75 MCG tablet, Take 75 mg by mouth Daily., Disp: , Rfl:  .  lisinopril (PRINIVIL,ZESTRIL) 10 MG tablet, TAKE 1 TABLET BY MOUTH EVERY DAY, Disp: 30 tablet, Rfl: 8 .  tetrahydrozoline-zinc (VISINE-AC) 0.05-0.25 % ophthalmic solution, Place 2 drops into both eyes 3 (three) times daily as needed (red eyes)., Disp: , Rfl:  .  warfarin (COUMADIN) 5 MG tablet, TAKE 1 TABLET BY MOUTH DAILY OR AS DIRECTED (Patient taking differently: Take 5mg  everyday except Monday take 2.5mg .), Disp: 30 tablet, Rfl: 5  Allergies:  Allergies  Allergen Reactions  . Crestor [Rosuvastatin]   . Ciprofloxacin Rash    Rash in the mouth  . Penicillins Hives and Rash  . Vancomycin Rash    "Red-man"    Past Medical History, Surgical history, Social history, and Family History were reviewed and updated.  Review of Systems: As above  Physical Exam:  height is 5\' 8"  (1.727 m) and weight is 176 lb (79.833 kg). Her oral temperature is 97.8 F (36.6 C). Her blood pressure is 142/65 and her pulse is 86. Her respiration is 16.   Elderly, thin but well-nourished white female. Head and neck exam shows no ocular or oral lesions. There are no palpable cervical or supraclavicular lymph nodes. Lungs are clear.  Cardiac exam regular rate and rhythm with a normal S1 and S2. There are no murmurs, rubs or bruits.. Abdomen is soft. She has good bowels. There is no fluid wave. There is no palpable liver or spleen tip. She has the healing right inguinal surgery scar. There is no erythema associated with this.. Back exam shows some slight kyphosis. No tenderness noted over the spine. She has slight tenderness over the right anterior pelvic region. Extremities shows significant osteoarthritic changes in her joints. She has some age related muscle atrophy. Neurological exam is nonfocal. Lymph node exam shows no adenopathy.   Lab Results  Component Value Date   WBC  4.2 07/31/2015   HGB 11.8 07/31/2015   HCT 36.1 07/31/2015   MCV 85 07/31/2015   PLT 187 07/31/2015     Chemistry      Component Value Date/Time   NA 144 06/05/2015 1433   NA 144 03/29/2015 1358   NA 139 02/13/2015 1144   K 4.5 06/05/2015 1433   K 4.6 03/29/2015 1358   K 4.5 02/13/2015 1144   CL 110 06/05/2015 1433   CL 107 02/13/2015 1144   CO2 26 06/05/2015 1433   CO2 23 03/29/2015 1358   CO2 26 02/13/2015 1144   BUN 21 06/05/2015 1433   BUN 25.2 03/29/2015 1358   BUN 27* 02/13/2015 1144   CREATININE 1.11* 06/05/2015 1433   CREATININE 1.24* 04/29/2015 1136   CREATININE 1.1 03/29/2015 1358      Component Value Date/Time   CALCIUM 9.1 06/05/2015 1433   CALCIUM 9.4 03/29/2015 1358   CALCIUM 9.4 02/13/2015 1144   ALKPHOS 53 04/29/2015 1136   ALKPHOS 57 03/29/2015 1358   ALKPHOS 54 02/13/2015 1144   AST 18 04/29/2015 1136   AST 17 03/29/2015 1358   AST 26 02/13/2015 1144   ALT 13* 04/29/2015 1136   ALT 11 03/29/2015 1358   ALT 23 02/13/2015 1144   BILITOT 1.0 04/29/2015 1136   BILITOT 0.75 03/29/2015 1358   BILITOT 1.10 02/13/2015 1144         Impression and Plan: [name] is a 82 year old white female with metastatic breast cancer. Her disease is confined to her bones. She has been stable. She has been stable now for about 10 years. She's done incredibly well. We will continue her on the Femara.  Her last CA 27.29 was up a little bit. We will see what it is this time.  The bone scan is reassuring. Again it will be interesting to see what the CA 27.29 is.  I'm not sure is why she is having billing issues with the Xgeva. I do not want to give her any Xgeva right now until I can make sure that she does not have to pay for the Xgeva. It is clearly indicated in her situation.  I'm glad that her blood count is a little bit better. Hopefully this is a good sign that her renal function is doing better.  Amy Napoleon, MD 2/22/20171:42 PM

## 2015-08-01 LAB — CANCER ANTIGEN 27.29: CA 27.29: 38.4 U/mL (ref 0.0–38.6)

## 2015-08-01 LAB — CANCER ANTIGEN 27-29 (PARALLEL TESTING): CA 27.29: 46 U/mL — ABNORMAL HIGH (ref <38)

## 2015-08-01 LAB — RETICULOCYTES: Reticulocyte Count: 0.6 % (ref 0.6–2.6)

## 2015-08-01 LAB — FERRITIN: Ferritin: 13 ng/ml (ref 9–269)

## 2015-08-01 LAB — IRON AND TIBC
%SAT: 21 % (ref 21–57)
Iron: 75 ug/dL (ref 41–142)
TIBC: 355 ug/dL (ref 236–444)
UIBC: 280 ug/dL (ref 120–384)

## 2015-08-01 LAB — ERYTHROPOIETIN: Erythropoietin: 10.1 m[IU]/mL (ref 2.6–18.5)

## 2015-08-06 ENCOUNTER — Encounter: Payer: Self-pay | Admitting: Cardiovascular Disease

## 2015-08-06 ENCOUNTER — Ambulatory Visit (INDEPENDENT_AMBULATORY_CARE_PROVIDER_SITE_OTHER): Payer: Medicare Other | Admitting: Pharmacist Clinician (PhC)/ Clinical Pharmacy Specialist

## 2015-08-06 ENCOUNTER — Ambulatory Visit (INDEPENDENT_AMBULATORY_CARE_PROVIDER_SITE_OTHER): Payer: Medicare Other | Admitting: Cardiovascular Disease

## 2015-08-06 VITALS — BP 135/69 | HR 72 | Ht 67.0 in | Wt 180.0 lb

## 2015-08-06 DIAGNOSIS — Z7901 Long term (current) use of anticoagulants: Secondary | ICD-10-CM

## 2015-08-06 DIAGNOSIS — I495 Sick sinus syndrome: Secondary | ICD-10-CM | POA: Diagnosis not present

## 2015-08-06 DIAGNOSIS — I481 Persistent atrial fibrillation: Secondary | ICD-10-CM | POA: Diagnosis not present

## 2015-08-06 DIAGNOSIS — I4891 Unspecified atrial fibrillation: Secondary | ICD-10-CM | POA: Diagnosis not present

## 2015-08-06 DIAGNOSIS — Z950 Presence of cardiac pacemaker: Secondary | ICD-10-CM

## 2015-08-06 DIAGNOSIS — I4819 Other persistent atrial fibrillation: Secondary | ICD-10-CM

## 2015-08-06 LAB — POCT INR: INR: 2.3
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
## 2015-08-06 NOTE — Patient Instructions (Signed)
Remote monitoring is used to monitor your Pacemaker from home. This monitoring reduces the number of office visits required to check your device to one time per year. It allows [place] to monitor the functioning of your device to ensure it is working properly. You are scheduled for a device check from home on Nov 04, 2015. You may send your transmission at any time that day. If you have a wireless device, the transmission will be sent automatically. After your physician reviews your transmission, you will receive a postcard with your next transmission date.  Dr. Sallyanne Kuster recommends that you schedule a follow-up appointment in: [place] ([facility]).

## 2015-08-20 ENCOUNTER — Other Ambulatory Visit: Payer: Self-pay | Admitting: Hematology & Oncology

## 2015-08-24 ENCOUNTER — Other Ambulatory Visit: Payer: Self-pay | Admitting: Cardiovascular Disease

## 2015-08-26 NOTE — Telephone Encounter (Signed)
Rx request sent to pharmacy.  

## 2015-09-04 LAB — CUP PACEART INCLINIC DEVICE CHECK
Battery Remaining Longevity: 117 mo
Battery Voltage: 3.03 V
Brady Statistic AP VP Percent: 0.07 %
Brady Statistic AP VS Percent: 74.06 %
Brady Statistic AS VP Percent: 0.02 %
Brady Statistic AS VS Percent: 25.84 %
Brady Statistic RA Percent Paced: 74.14 %
Brady Statistic RV Percent Paced: 0.1 %
Date Time Interrogation Session: 20170228160126
Implantable Lead Implant Date: 20160223
Implantable Lead Implant Date: 20160223
Implantable Lead Location: 753859
Implantable Lead Location: 753860
Implantable Lead Model: 5076
Implantable Lead Model: 5076
Lead Channel Impedance Value: 361 Ohm
Lead Channel Impedance Value: 456 Ohm
Lead Channel Impedance Value: 475 Ohm
Lead Channel Impedance Value: 513 Ohm
Lead Channel Pacing Threshold Amplitude: 0.75 V
Lead Channel Pacing Threshold Amplitude: 0.875 V
Lead Channel Pacing Threshold Pulse Width: 0.4 ms
Lead Channel Pacing Threshold Pulse Width: 0.4 ms
Lead Channel Sensing Intrinsic Amplitude: 1.375 mV
Lead Channel Sensing Intrinsic Amplitude: 1.375 mV
Lead Channel Sensing Intrinsic Amplitude: 11.375 mV
Lead Channel Sensing Intrinsic Amplitude: 11.375 mV
Lead Channel Setting Pacing Amplitude: 1.5 V
Lead Channel Setting Pacing Amplitude: 2 V
Lead Channel Setting Pacing Pulse Width: 0.4 ms
Lead Channel Setting Sensing Sensitivity: 0.9 mV

## 2015-09-05 ENCOUNTER — Other Ambulatory Visit: Payer: Self-pay | Admitting: Cardiovascular Disease

## 2015-09-05 NOTE — Telephone Encounter (Signed)
Furosemide refill refused - was only prescribed as a short term med

## 2015-09-09 ENCOUNTER — Telehealth: Payer: Self-pay | Admitting: Cardiovascular Disease

## 2015-09-09 NOTE — Telephone Encounter (Signed)
New message      Pt has a pacemaker.  She has been having palpitations everyday last week. No other symptoms

## 2015-09-09 NOTE — Telephone Encounter (Signed)
LMOVM for pt to return call 

## 2015-09-10 NOTE — Telephone Encounter (Signed)
Spoke w/ pt and requested that she send a manual transmission w/ her home monitor. Pt verbalized understanding.  

## 2015-09-13 DIAGNOSIS — Z0000 Encounter for general adult medical examination without abnormal findings: Secondary | ICD-10-CM | POA: Diagnosis not present

## 2015-09-13 DIAGNOSIS — C7951 Secondary malignant neoplasm of bone: Secondary | ICD-10-CM | POA: Diagnosis not present

## 2015-09-13 DIAGNOSIS — I4891 Unspecified atrial fibrillation: Secondary | ICD-10-CM | POA: Diagnosis not present

## 2015-09-13 DIAGNOSIS — C50919 Malignant neoplasm of unspecified site of unspecified female breast: Secondary | ICD-10-CM | POA: Diagnosis not present

## 2015-09-13 DIAGNOSIS — E782 Mixed hyperlipidemia: Secondary | ICD-10-CM | POA: Diagnosis not present

## 2015-09-13 DIAGNOSIS — E039 Hypothyroidism, unspecified: Secondary | ICD-10-CM | POA: Diagnosis not present

## 2015-09-13 DIAGNOSIS — F411 Generalized anxiety disorder: Secondary | ICD-10-CM | POA: Diagnosis not present

## 2015-09-13 DIAGNOSIS — N183 Chronic kidney disease, stage 3 (moderate): Secondary | ICD-10-CM | POA: Diagnosis not present

## 2015-09-13 DIAGNOSIS — J301 Allergic rhinitis due to pollen: Secondary | ICD-10-CM | POA: Diagnosis not present

## 2015-09-13 DIAGNOSIS — I129 Hypertensive chronic kidney disease with stage 1 through stage 4 chronic kidney disease, or unspecified chronic kidney disease: Secondary | ICD-10-CM | POA: Diagnosis not present

## 2015-09-13 NOTE — Telephone Encounter (Signed)
Spoke w/ pt and informed her that we still have not received her remote transmission. Pt informed me that she keep getting an error code and that she tried to call tech services but was unsuccessful. Instructed pt that I would try and help her trouble shoot her home monitor. Informed pt that we received her remote transmission and that a device tech would call her back w/ the results. Pt verbalized understanding.

## 2015-09-13 NOTE — Telephone Encounter (Signed)
Informed patient that remote was received. No episodes. Patient voiced understanding.

## 2015-09-15 ENCOUNTER — Other Ambulatory Visit: Payer: Self-pay | Admitting: Hematology & Oncology

## 2015-09-19 ENCOUNTER — Ambulatory Visit (INDEPENDENT_AMBULATORY_CARE_PROVIDER_SITE_OTHER): Payer: Medicare Other | Admitting: Pharmacist Clinician (PhC)/ Clinical Pharmacy Specialist

## 2015-09-19 DIAGNOSIS — I4891 Unspecified atrial fibrillation: Secondary | ICD-10-CM | POA: Diagnosis not present

## 2015-09-19 DIAGNOSIS — Z7901 Long term (current) use of anticoagulants: Secondary | ICD-10-CM | POA: Diagnosis not present

## 2015-09-19 LAB — POCT INR: INR: 3

## 2015-09-21 ENCOUNTER — Other Ambulatory Visit: Payer: Self-pay | Admitting: Cardiovascular Disease

## 2015-09-27 ENCOUNTER — Emergency Department (HOSPITAL_COMMUNITY)
Admission: EM | Admit: 2015-09-27 | Discharge: 2015-09-27 | Disposition: A | Discharge status: Home / Self Care | Payer: Medicare Other | Attending: Emergency Medicine | Admitting: Emergency Medicine

## 2015-09-27 ENCOUNTER — Emergency Department (HOSPITAL_COMMUNITY): Payer: Medicare Other

## 2015-09-27 ENCOUNTER — Encounter (HOSPITAL_COMMUNITY): Payer: Self-pay | Admitting: *Deleted

## 2015-09-27 DIAGNOSIS — Z79899 Other long term (current) drug therapy: Secondary | ICD-10-CM | POA: Diagnosis not present

## 2015-09-27 DIAGNOSIS — I10 Essential (primary) hypertension: Secondary | ICD-10-CM | POA: Diagnosis not present

## 2015-09-27 DIAGNOSIS — M1990 Unspecified osteoarthritis, unspecified site: Secondary | ICD-10-CM | POA: Insufficient documentation

## 2015-09-27 DIAGNOSIS — M7989 Other specified soft tissue disorders: Secondary | ICD-10-CM | POA: Diagnosis not present

## 2015-09-27 DIAGNOSIS — Z880 Allergy status to penicillin: Secondary | ICD-10-CM | POA: Diagnosis not present

## 2015-09-27 DIAGNOSIS — Z8719 Personal history of other diseases of the digestive system: Secondary | ICD-10-CM | POA: Diagnosis not present

## 2015-09-27 DIAGNOSIS — Z950 Presence of cardiac pacemaker: Secondary | ICD-10-CM | POA: Insufficient documentation

## 2015-09-27 DIAGNOSIS — Z8639 Personal history of other endocrine, nutritional and metabolic disease: Secondary | ICD-10-CM | POA: Diagnosis not present

## 2015-09-27 DIAGNOSIS — Z853 Personal history of malignant neoplasm of breast: Secondary | ICD-10-CM | POA: Insufficient documentation

## 2015-09-27 DIAGNOSIS — I4891 Unspecified atrial fibrillation: Secondary | ICD-10-CM | POA: Diagnosis not present

## 2015-09-27 DIAGNOSIS — M79605 Pain in left leg: Secondary | ICD-10-CM | POA: Diagnosis present

## 2015-09-27 DIAGNOSIS — F419 Anxiety disorder, unspecified: Secondary | ICD-10-CM | POA: Diagnosis not present

## 2015-09-27 DIAGNOSIS — L03116 Cellulitis of left lower limb: Secondary | ICD-10-CM | POA: Insufficient documentation

## 2015-09-27 DIAGNOSIS — Z7901 Long term (current) use of anticoagulants: Secondary | ICD-10-CM | POA: Diagnosis not present

## 2015-09-27 DIAGNOSIS — J449 Chronic obstructive pulmonary disease, unspecified: Secondary | ICD-10-CM | POA: Diagnosis not present

## 2015-09-27 LAB — CBC WITH DIFFERENTIAL/PLATELET
Basophils Absolute: 0 10*3/uL (ref 0.0–0.1)
Basophils Relative: 1 %
Eosinophils Absolute: 0.1 10*3/uL (ref 0.0–0.7)
Eosinophils Relative: 2 %
HCT: 36.5 % (ref 36.0–46.0)
Hemoglobin: 11.9 g/dL — ABNORMAL LOW (ref 12.0–15.0)
Lymphocytes Relative: 26 %
Lymphs Abs: 1.3 10*3/uL (ref 0.7–4.0)
MCH: 27.5 pg (ref 26.0–34.0)
MCHC: 32.6 g/dL (ref 30.0–36.0)
MCV: 84.3 fL (ref 78.0–100.0)
Monocytes Absolute: 0.5 10*3/uL (ref 0.1–1.0)
Monocytes Relative: 9 %
Neutro Abs: 3.3 10*3/uL (ref 1.7–7.7)
Neutrophils Relative %: 62 %
Platelets: 202 10*3/uL (ref 150–400)
RBC: 4.33 MIL/uL (ref 3.87–5.11)
RDW: 14.4 % (ref 11.5–15.5)
WBC: 5.3 10*3/uL (ref 4.0–10.5)

## 2015-09-27 LAB — COMPREHENSIVE METABOLIC PANEL
ALT: 12 U/L — ABNORMAL LOW (ref 14–54)
AST: 17 U/L (ref 15–41)
Albumin: 3.9 g/dL (ref 3.5–5.0)
Alkaline Phosphatase: 44 U/L (ref 38–126)
Anion gap: 10 (ref 5–15)
BUN: 21 mg/dL — ABNORMAL HIGH (ref 6–20)
CO2: 24 mmol/L (ref 22–32)
Calcium: 9.7 mg/dL (ref 8.9–10.3)
Chloride: 106 mmol/L (ref 101–111)
Creatinine, Ser: 1.36 mg/dL — ABNORMAL HIGH (ref 0.44–1.00)
GFR calc Af Amer: 39 mL/min — ABNORMAL LOW (ref >60)
GFR calc non Af Amer: 34 mL/min — ABNORMAL LOW (ref >60)
Glucose, Bld: 108 mg/dL — ABNORMAL HIGH (ref 65–99)
Potassium: 5 mmol/L (ref 3.5–5.1)
Sodium: 140 mmol/L (ref 135–145)
Total Bilirubin: 1.3 mg/dL — ABNORMAL HIGH (ref 0.3–1.2)
Total Protein: 6.8 g/dL (ref 6.5–8.1)

## 2015-09-27 MED ORDER — CEPHALEXIN 500 MG PO CAPS: 500.0000 mg | ORAL_CAPSULE | Freq: Three times a day (TID) | ORAL | Status: DC

## 2015-09-27 MED ORDER — LIDOCAINE HCL (PF) 1 % IJ SOLN
INTRAMUSCULAR | Status: AC
  Administered 2015-09-27: 5 mL
  Filled 2015-09-27: qty 5

## 2015-09-27 MED ORDER — CEFTRIAXONE SODIUM 1 G IJ SOLR
1.0000 g | Freq: Once | INTRAMUSCULAR | Status: AC
  Administered 2015-09-27: 1 g via INTRAMUSCULAR
  Filled 2015-09-27: qty 10

## 2015-09-27 NOTE — ED Notes (Signed)
PT states L lower extremity redness, pain and swelling where she had surgery 2 years prior.

## 2015-09-27 NOTE — Discharge Instructions (Signed)
Keflex as prescribed.  Follow-up with your orthopedist if not improving by Monday.  Return to the emergency department if you develop worsening swelling, redness, pain, high fever and chills, or other new and concerning symptoms.   Cellulitis Cellulitis is an infection of the skin and the tissue beneath it. The infected area is usually red and tender. Cellulitis occurs most often in the arms and lower legs.  CAUSES  Cellulitis is caused by bacteria that enter the skin through cracks or cuts in the skin. The most common types of bacteria that cause cellulitis are staphylococci and streptococci. SIGNS AND SYMPTOMS   Redness and warmth.  Swelling.  Tenderness or pain.  Fever. DIAGNOSIS  Your health care provider can usually determine what is wrong based on a physical exam. Blood tests may also be done. TREATMENT  Treatment usually involves taking an antibiotic medicine. HOME CARE INSTRUCTIONS   Take your antibiotic medicine as directed by your health care provider. Finish the antibiotic even if you start to feel better.  Keep the infected arm or leg elevated to reduce swelling.  Apply a warm cloth to the affected area up to 4 times per day to relieve pain.  Take medicines only as directed by your health care provider.  Keep all follow-up visits as directed by your health care provider. SEEK MEDICAL CARE IF:   You notice red streaks coming from the infected area.  Your red area gets larger or turns dark in color.  Your bone or joint underneath the infected area becomes painful after the skin has healed.  Your infection returns in the same area or another area.  You notice a swollen bump in the infected area.  You develop new symptoms.  You have a fever. SEEK IMMEDIATE MEDICAL CARE IF:   You feel very sleepy.  You develop vomiting or diarrhea.  You have a general ill feeling (malaise) with muscle aches and pains.   This information is not intended to replace  advice given to you by your health care provider. Make sure you discuss any questions you have with your health care provider.   Document Released: 03/04/2005 Document Revised: 02/13/2015 Document Reviewed: 08/10/2011 Elsevier Interactive Patient Education [employer].
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
## 2015-10-19 ENCOUNTER — Other Ambulatory Visit: Payer: Self-pay | Admitting: Hematology & Oncology

## 2015-10-31 ENCOUNTER — Ambulatory Visit (INDEPENDENT_AMBULATORY_CARE_PROVIDER_SITE_OTHER): Payer: Medicare Other | Admitting: Pharmacist

## 2015-10-31 DIAGNOSIS — I4891 Unspecified atrial fibrillation: Secondary | ICD-10-CM | POA: Diagnosis not present

## 2015-10-31 DIAGNOSIS — Z7901 Long term (current) use of anticoagulants: Secondary | ICD-10-CM

## 2015-10-31 LAB — POCT INR: INR: 1.9

## 2015-11-05 ENCOUNTER — Ambulatory Visit (INDEPENDENT_AMBULATORY_CARE_PROVIDER_SITE_OTHER): Payer: Medicare Other | Admitting: *Deleted

## 2015-11-05 DIAGNOSIS — I495 Sick sinus syndrome: Secondary | ICD-10-CM | POA: Diagnosis not present

## 2015-11-06 NOTE — Progress Notes (Signed)
Remote pacemaker transmission.   

## 2015-11-07 DIAGNOSIS — R39198 Other difficulties with micturition: Secondary | ICD-10-CM | POA: Diagnosis not present

## 2015-11-07 DIAGNOSIS — R3989 Other symptoms and signs involving the genitourinary system: Secondary | ICD-10-CM | POA: Diagnosis not present

## 2015-11-08 ENCOUNTER — Telehealth: Payer: Self-pay | Admitting: Cardiovascular Disease

## 2015-11-08 ENCOUNTER — Encounter: Payer: Self-pay | Admitting: Cardiovascular Disease

## 2015-11-08 DIAGNOSIS — Z0100 Encounter for examination of eyes and vision without abnormal findings: Secondary | ICD-10-CM | POA: Diagnosis not present

## 2015-11-08 NOTE — Telephone Encounter (Signed)
Sent via epic. Please confirm receipt, EPIC fax # associated with Dr. Valetta Close is different than the one in your request. Thanks

## 2015-11-08 NOTE — Telephone Encounter (Signed)
New message      Request for surgical clearance:  What type of surgery is being performed? Cataract surgery----rt eye first then will need left eye done 1. When is this surgery scheduled? Pending clearance  Are there any medications that need to be held prior to surgery and how long? Pt can stay on coumadin---but she will be getting a lot of dilation eye drops ---need cardiac clearance  Name of physician performing surgery?  Dr Valetta Close 2. What is your office phone and fax number? Fax [phone]

## 2015-11-13 DIAGNOSIS — Z853 Personal history of malignant neoplasm of breast: Secondary | ICD-10-CM | POA: Diagnosis not present

## 2015-11-20 ENCOUNTER — Other Ambulatory Visit: Payer: Self-pay | Admitting: Hematology & Oncology

## 2015-11-20 LAB — CUP PACEART REMOTE DEVICE CHECK
Battery Remaining Longevity: 115 mo
Battery Voltage: 3.02 V
Brady Statistic AP VP Percent: 0.08 %
Brady Statistic AP VS Percent: 77.65 %
Brady Statistic AS VP Percent: 0.22 %
Brady Statistic AS VS Percent: 22.05 %
Brady Statistic RA Percent Paced: 77.73 %
Brady Statistic RV Percent Paced: 0.29 %
Date Time Interrogation Session: 20170529144803
Implantable Lead Implant Date: 20160223
Implantable Lead Implant Date: 20160223
Implantable Lead Location: 753859
Implantable Lead Location: 753860
Implantable Lead Model: 5076
Implantable Lead Model: 5076
Lead Channel Impedance Value: 380 Ohm
Lead Channel Impedance Value: 456 Ohm
Lead Channel Impedance Value: 475 Ohm
Lead Channel Impedance Value: 513 Ohm
Lead Channel Pacing Threshold Amplitude: 0.875 V
Lead Channel Pacing Threshold Amplitude: 0.875 V
Lead Channel Pacing Threshold Pulse Width: 0.4 ms
Lead Channel Pacing Threshold Pulse Width: 0.4 ms
Lead Channel Sensing Intrinsic Amplitude: 1.625 mV
Lead Channel Sensing Intrinsic Amplitude: 1.625 mV
Lead Channel Sensing Intrinsic Amplitude: 12.25 mV
Lead Channel Sensing Intrinsic Amplitude: 12.25 mV
Lead Channel Setting Pacing Amplitude: 1.5 V
Lead Channel Setting Pacing Amplitude: 2.25 V
Lead Channel Setting Pacing Pulse Width: 0.4 ms
Lead Channel Setting Sensing Sensitivity: 0.9 mV

## 2015-11-26 ENCOUNTER — Encounter: Payer: Self-pay | Admitting: Cardiology

## 2015-11-26 ENCOUNTER — Other Ambulatory Visit: Payer: Self-pay | Admitting: *Deleted

## 2015-11-26 DIAGNOSIS — C50919 Malignant neoplasm of unspecified site of unspecified female breast: Secondary | ICD-10-CM

## 2015-11-26 DIAGNOSIS — C7951 Secondary malignant neoplasm of bone: Principal | ICD-10-CM

## 2015-11-27 ENCOUNTER — Ambulatory Visit: Payer: Medicare Other

## 2015-11-27 ENCOUNTER — Ambulatory Visit: Payer: Medicare Other | Admitting: Hematology & Oncology

## 2015-11-27 ENCOUNTER — Other Ambulatory Visit: Payer: Medicare Other

## 2015-11-28 DIAGNOSIS — H2513 Age-related nuclear cataract, bilateral: Secondary | ICD-10-CM | POA: Diagnosis not present

## 2015-12-02 ENCOUNTER — Other Ambulatory Visit: Payer: Self-pay | Admitting: Cardiovascular Disease

## 2015-12-02 NOTE — Telephone Encounter (Signed)
Rx(s) sent to pharmacy electronically.  

## 2015-12-04 ENCOUNTER — Other Ambulatory Visit: Payer: Medicare Other

## 2015-12-04 ENCOUNTER — Ambulatory Visit: Payer: Medicare Other | Admitting: Hematology & Oncology

## 2015-12-04 ENCOUNTER — Ambulatory Visit: Payer: Medicare Other

## 2015-12-06 DIAGNOSIS — Z853 Personal history of malignant neoplasm of breast: Secondary | ICD-10-CM | POA: Diagnosis not present

## 2015-12-06 DIAGNOSIS — C50912 Malignant neoplasm of unspecified site of left female breast: Secondary | ICD-10-CM | POA: Diagnosis not present

## 2015-12-11 ENCOUNTER — Ambulatory Visit: Payer: Medicare Other

## 2015-12-11 ENCOUNTER — Other Ambulatory Visit (HOSPITAL_BASED_OUTPATIENT_CLINIC_OR_DEPARTMENT_OTHER): Payer: Medicare Other

## 2015-12-11 ENCOUNTER — Ambulatory Visit (HOSPITAL_BASED_OUTPATIENT_CLINIC_OR_DEPARTMENT_OTHER): Payer: Medicare Other | Admitting: Hematology & Oncology

## 2015-12-11 ENCOUNTER — Encounter: Payer: Self-pay | Admitting: Hematology & Oncology

## 2015-12-11 VITALS — BP 129/68 | HR 85 | Temp 97.6°F | Resp 16 | Ht 67.0 in | Wt 174.0 lb

## 2015-12-11 DIAGNOSIS — C7951 Secondary malignant neoplasm of bone: Secondary | ICD-10-CM

## 2015-12-11 DIAGNOSIS — E039 Hypothyroidism, unspecified: Secondary | ICD-10-CM

## 2015-12-11 DIAGNOSIS — C50919 Malignant neoplasm of unspecified site of unspecified female breast: Secondary | ICD-10-CM

## 2015-12-11 DIAGNOSIS — D509 Iron deficiency anemia, unspecified: Secondary | ICD-10-CM | POA: Diagnosis not present

## 2015-12-11 DIAGNOSIS — E038 Other specified hypothyroidism: Secondary | ICD-10-CM

## 2015-12-11 DIAGNOSIS — R5383 Other fatigue: Secondary | ICD-10-CM

## 2015-12-11 DIAGNOSIS — C50912 Malignant neoplasm of unspecified site of left female breast: Secondary | ICD-10-CM

## 2015-12-11 LAB — CBC WITH DIFFERENTIAL (CANCER CENTER ONLY)
BASO#: 0 10*3/uL (ref 0.0–0.2)
BASO%: 0.6 % (ref 0.0–2.0)
EOS%: 4.1 % (ref 0.0–7.0)
Eosinophils Absolute: 0.2 10*3/uL (ref 0.0–0.5)
HCT: 34.8 % (ref 34.8–46.6)
HGB: 11.8 g/dL (ref 11.6–15.9)
LYMPH#: 1.4 10*3/uL (ref 0.9–3.3)
LYMPH%: 26.7 % (ref 14.0–48.0)
MCH: 29.4 pg (ref 26.0–34.0)
MCHC: 33.9 g/dL (ref 32.0–36.0)
MCV: 87 fL (ref 81–101)
MONO#: 0.4 10*3/uL (ref 0.1–0.9)
MONO%: 8.6 % (ref 0.0–13.0)
NEUT#: 3.1 10*3/uL (ref 1.5–6.5)
NEUT%: 60 % (ref 39.6–80.0)
Platelets: 186 10*3/uL (ref 145–400)
RBC: 4.02 10*6/uL (ref 3.70–5.32)
RDW: 14.1 % (ref 11.1–15.7)
WBC: 5.1 10*3/uL (ref 3.9–10.0)

## 2015-12-11 LAB — COMPREHENSIVE METABOLIC PANEL (CC13)
ALT: 11 IU/L (ref 0–32)
AST (SGOT): 17 IU/L (ref 0–40)
Albumin, Serum: 3.9 g/dL (ref 3.5–4.7)
Albumin/Globulin Ratio: 1.7 (ref 1.2–2.2)
Alkaline Phosphatase, S: 57 IU/L (ref 39–117)
BUN/Creatinine Ratio: 15 (ref 12–28)
BUN: 18 mg/dL (ref 8–27)
Bilirubin Total: 0.6 mg/dL (ref 0.0–1.2)
Calcium, Ser: 9.7 mg/dL (ref 8.7–10.3)
Carbon Dioxide, Total: 25 mmol/L (ref 18–29)
Chloride, Ser: 107 mmol/L — ABNORMAL HIGH (ref 96–106)
Creatinine, Ser: 1.18 mg/dL — ABNORMAL HIGH (ref 0.57–1.00)
GFR calc Af Amer: 48 mL/min/{1.73_m2} — ABNORMAL LOW (ref >59)
GFR calc non Af Amer: 41 mL/min/{1.73_m2} — ABNORMAL LOW (ref >59)
Globulin, Total: 2.3 g/dL (ref 1.5–4.5)
Glucose: 90 mg/dL (ref 65–99)
Potassium, Ser: 5.2 mmol/L (ref 3.5–5.2)
Sodium: 140 mmol/L (ref 134–144)
Total Protein: 6.2 g/dL (ref 6.0–8.5)

## 2015-12-11 NOTE — Progress Notes (Signed)
Hematology and Oncology Follow Up Visit  ADYLYN HEITING [id number] 09/26/1925 82 y.o. 12/11/2015   Principle Diagnosis:  Metastatic breast cancer, bone metastasis only.  Current Therapy:   1. Femara 2.5 mg p.o. daily. 2. Xgeva 120mg  sq q 3 month     Interim History:  Ms.  Vanduyne is for followup. She feels pretty fatigued. We did check her iron studies back in February. Her iron levels were borderline. I'll have to check them again to see if they are lower.  She has a pacemaker in. This is not causing issues for her as far she knows.  She is on Coumadin. She has had no bleeding.  Her CA 20 12/07/2027 back in February was 3 which was better. Overall, this is been holding pretty steady.  She had a bone scan done back in February. This looked very stable.  Her appetite is doing okay. She has had no nausea vomiting. Her weight is holding steady.   She is on Synthroid. I just wonder if her TSH is off. We may have to check this on her also. Last emesis checked was back over a year and half ago.   Overall, her performance status is [name]   Medications:  Current outpatient prescriptions:  .  albuterol (PROVENTIL HFA;VENTOLIN HFA) 108 (90 BASE) MCG/ACT inhaler, Inhale 2 puffs into the lungs every 6 (six) hours as needed for wheezing or shortness of breath., Disp: , Rfl:  .  ALPRAZolam (XANAX) 0.5 MG tablet, Take 0.25 mg by mouth 2 (two) times daily as needed for anxiety. , Disp: , Rfl: 0 .  cephALEXin (KEFLEX) 500 MG capsule, Take 1 capsule (500 mg total) by mouth 3 (three) times daily., Disp: 21 capsule, Rfl: 0 .  diclofenac sodium (VOLTAREN) 1 % GEL, Apply 1 application topically daily as needed (pain). , Disp: , Rfl: 1 .  diltiazem (CARDIZEM CD) 240 MG 24 hr capsule, Take 1 capsule (240 mg total) by mouth daily., Disp: 30 capsule, Rfl: 6 .  diltiazem (TIAZAC) 240 MG 24 hr capsule, TAKE 1 CAPSULE BY MOUTH EVERY DAY, Disp: 30 capsule, Rfl: 11 .  ergocalciferol (VITAMIN D2) 50000 UNITS  capsule, Take 1 capsule (50,000 Units total) by mouth once a week., Disp: 4 capsule, Rfl: 11 .  HYDROcodone-acetaminophen (NORCO) 5-325 MG tablet, Take 1 tablet by mouth every 6 (six) hours as needed for moderate pain., Disp: 20 tablet, Rfl: 0 .  letrozole (FEMARA) 2.5 MG tablet, TAKE 1 TABLET BY MOUTH EVERY DAY, Disp: 30 tablet, Rfl: 0 .  levothyroxine (SYNTHROID, LEVOTHROID) 75 MCG tablet, Take 75 mg by mouth Daily., Disp: , Rfl:  .  lisinopril (PRINIVIL,ZESTRIL) 10 MG tablet, TAKE 1 TABLET BY MOUTH EVERY DAY, Disp: 90 tablet, Rfl: 2 .  tetrahydrozoline-zinc (VISINE-AC) 0.05-0.25 % ophthalmic solution, Place 2 drops into both eyes 3 (three) times daily as needed (red eyes)., Disp: , Rfl:  .  warfarin (COUMADIN) 5 MG tablet, Take 1/2 to 1 tablet by mouth daily as directed by coumadin clinic (Patient taking differently: Take 5 mg by mouth. Take 1/2 to 1 tablet by mouth daily as directed by coumadin clinic), Disp: 90 tablet, Rfl: 1  Allergies:  Allergies  Allergen Reactions  . Crestor [Rosuvastatin]   . Ciprofloxacin Rash    Rash in the mouth  . Penicillins Hives and Rash  . Vancomycin Rash    "Red-man"    Past Medical History, Surgical history, Social history, and Family History were reviewed and updated.  Review of  Systems: As above  Physical Exam:  height is 5\' 7"  (1.702 m) and weight is 174 lb (78.926 kg). Her oral temperature is 97.6 F (36.4 C). Her blood pressure is 129/68 and her pulse is 85. Her respiration is 16.   Elderly, thin but well-nourished white female. Head and neck exam shows no ocular or oral lesions. There are no palpable cervical or supraclavicular lymph nodes. Lungs are clear. Cardiac exam regular rate and rhythm with a normal S1 and S2. There are no murmurs, rubs or bruits.. Abdomen is soft. She has good bowels. There is no fluid wave. There is no palpable liver or spleen tip. She has the healing right inguinal surgery scar. There is no erythema associated with  this.. Back exam shows some slight kyphosis. No tenderness noted over the spine. She has slight tenderness over the right anterior pelvic region. Extremities shows significant osteoarthritic changes in her joints. She has some age related muscle atrophy. Neurological exam is nonfocal. Lymph node exam shows no adenopathy.   Lab Results  Component Value Date   WBC 5.1 12/11/2015   HGB 11.8 12/11/2015   HCT 34.8 12/11/2015   MCV 87 12/11/2015   PLT 186 12/11/2015     Chemistry      Component Value Date/Time   NA 140 09/27/2015 1752   NA 142 07/31/2015 1203   NA 139 02/13/2015 1144   K 5.0 09/27/2015 1752   K 5.1 07/31/2015 1203   K 4.5 02/13/2015 1144   CL 106 09/27/2015 1752   CL 107 02/13/2015 1144   CO2 24 09/27/2015 1752   CO2 23 07/31/2015 1203   CO2 26 02/13/2015 1144   BUN 21* 09/27/2015 1752   BUN 23.3 07/31/2015 1203   BUN 27* 02/13/2015 1144   CREATININE 1.36* 09/27/2015 1752   CREATININE 1.1 07/31/2015 1203   CREATININE 1.11* 06/05/2015 1433      Component Value Date/Time   CALCIUM 9.7 09/27/2015 1752   CALCIUM 9.3 07/31/2015 1203   CALCIUM 9.4 02/13/2015 1144   ALKPHOS 44 09/27/2015 1752   ALKPHOS 57 07/31/2015 1203   ALKPHOS 54 02/13/2015 1144   AST 17 09/27/2015 1752   AST 18 07/31/2015 1203   AST 26 02/13/2015 1144   ALT 12* 09/27/2015 1752   ALT 15 07/31/2015 1203   ALT 23 02/13/2015 1144   BILITOT 1.3* 09/27/2015 1752   BILITOT 0.96 07/31/2015 1203   BILITOT 1.10 02/13/2015 1144         Impression and Plan: Ms. Nancarrow is a 82 year old white female with metastatic breast cancer. Her disease is confined to her bones. She has been stable. She has been stable now for about 10 years. She's done incredibly well. We will continue her on the Femara.  Her last CA 27.29 was down a bit. It was down to. 46.  I'm not sure why she feels so tired. I do not know if this might be related to her heart. She certainly is not anemic. However, her iron studies back  in February did show some iron deficiency. As such, I will have to repeat these and see how they look.  Her last bone scan was done back in February. I don't think she will be due for another bone scan probably until the winter of 2018.  We will have to get her calcium levels back before we give her Xgeva.  I suppose metastatic breast-cancer can be a problem for her at some point. So far, we have been  following her for 10 years and everything has been relatively stable.  I will go ahead and plan to get her back in another 2 or 3 months. We will get her back sooner for the Xgeva and iron if she needs that.   Volanda Napoleon, MD 7/5/20174:24 PM

## 2015-12-12 ENCOUNTER — Ambulatory Visit (INDEPENDENT_AMBULATORY_CARE_PROVIDER_SITE_OTHER): Payer: Medicare Other | Admitting: Pharmacist Clinician (PhC)/ Clinical Pharmacy Specialist

## 2015-12-12 DIAGNOSIS — I4891 Unspecified atrial fibrillation: Secondary | ICD-10-CM

## 2015-12-12 DIAGNOSIS — Z7901 Long term (current) use of anticoagulants: Secondary | ICD-10-CM

## 2015-12-12 LAB — POCT INR: INR: 2.3

## 2015-12-21 ENCOUNTER — Other Ambulatory Visit: Payer: Self-pay | Admitting: Hematology & Oncology

## 2016-01-09 DIAGNOSIS — R0600 Dyspnea, unspecified: Secondary | ICD-10-CM | POA: Diagnosis not present

## 2016-01-09 DIAGNOSIS — D485 Neoplasm of uncertain behavior of skin: Secondary | ICD-10-CM | POA: Diagnosis not present

## 2016-01-09 DIAGNOSIS — L0390 Cellulitis, unspecified: Secondary | ICD-10-CM | POA: Diagnosis not present

## 2016-01-20 ENCOUNTER — Other Ambulatory Visit: Payer: Self-pay | Admitting: Hematology & Oncology

## 2016-01-23 ENCOUNTER — Ambulatory Visit (INDEPENDENT_AMBULATORY_CARE_PROVIDER_SITE_OTHER): Payer: Medicare Other | Admitting: Pharmacist

## 2016-01-23 DIAGNOSIS — I4891 Unspecified atrial fibrillation: Secondary | ICD-10-CM | POA: Diagnosis not present

## 2016-01-23 DIAGNOSIS — Z7901 Long term (current) use of anticoagulants: Secondary | ICD-10-CM | POA: Diagnosis not present

## 2016-01-23 LAB — POCT INR: INR: 1.7

## 2016-01-29 DIAGNOSIS — R350 Frequency of micturition: Secondary | ICD-10-CM | POA: Diagnosis not present

## 2016-01-29 DIAGNOSIS — R0600 Dyspnea, unspecified: Secondary | ICD-10-CM | POA: Diagnosis not present

## 2016-02-04 ENCOUNTER — Ambulatory Visit (INDEPENDENT_AMBULATORY_CARE_PROVIDER_SITE_OTHER): Payer: Medicare Other | Admitting: *Deleted

## 2016-02-04 DIAGNOSIS — I495 Sick sinus syndrome: Secondary | ICD-10-CM

## 2016-02-05 NOTE — Progress Notes (Signed)
Remote pacemaker transmission.   

## 2016-02-06 ENCOUNTER — Encounter: Payer: Self-pay | Admitting: Cardiology

## 2016-02-12 ENCOUNTER — Other Ambulatory Visit: Payer: Medicare Other

## 2016-02-12 ENCOUNTER — Ambulatory Visit: Payer: Medicare Other | Admitting: Hematology & Oncology

## 2016-02-15 DIAGNOSIS — J45909 Unspecified asthma, uncomplicated: Secondary | ICD-10-CM | POA: Diagnosis not present

## 2016-02-15 DIAGNOSIS — R05 Cough: Secondary | ICD-10-CM | POA: Diagnosis not present

## 2016-02-15 DIAGNOSIS — R0602 Shortness of breath: Secondary | ICD-10-CM | POA: Diagnosis not present

## 2016-02-17 LAB — CUP PACEART REMOTE DEVICE CHECK
Battery Remaining Longevity: 105 mo
Battery Voltage: 3.02 V
Brady Statistic AP VP Percent: 0.08 %
Brady Statistic AP VS Percent: 85.07 %
Brady Statistic AS VP Percent: 0.03 %
Brady Statistic AS VS Percent: 14.82 %
Brady Statistic RA Percent Paced: 85.15 %
Brady Statistic RV Percent Paced: 0.11 %
Date Time Interrogation Session: 20170829141226
Implantable Lead Implant Date: 20160223
Implantable Lead Implant Date: 20160223
Implantable Lead Location: 753859
Implantable Lead Location: 753860
Implantable Lead Model: 5076
Implantable Lead Model: 5076
Lead Channel Impedance Value: 361 Ohm
Lead Channel Impedance Value: 418 Ohm
Lead Channel Impedance Value: 475 Ohm
Lead Channel Impedance Value: 475 Ohm
Lead Channel Pacing Threshold Amplitude: 1 V
Lead Channel Pacing Threshold Amplitude: 1 V
Lead Channel Pacing Threshold Pulse Width: 0.4 ms
Lead Channel Pacing Threshold Pulse Width: 0.4 ms
Lead Channel Sensing Intrinsic Amplitude: 1.5 mV
Lead Channel Sensing Intrinsic Amplitude: 1.5 mV
Lead Channel Sensing Intrinsic Amplitude: 10.75 mV
Lead Channel Sensing Intrinsic Amplitude: 10.75 mV
Lead Channel Setting Pacing Amplitude: 1.5 V
Lead Channel Setting Pacing Amplitude: 2.75 V
Lead Channel Setting Pacing Pulse Width: 0.4 ms
Lead Channel Setting Sensing Sensitivity: 0.9 mV

## 2016-02-20 ENCOUNTER — Ambulatory Visit (INDEPENDENT_AMBULATORY_CARE_PROVIDER_SITE_OTHER): Payer: Medicare Other | Admitting: Pharmacist Clinician (PhC)/ Clinical Pharmacy Specialist

## 2016-02-20 DIAGNOSIS — I4891 Unspecified atrial fibrillation: Secondary | ICD-10-CM

## 2016-02-20 DIAGNOSIS — Z7901 Long term (current) use of anticoagulants: Secondary | ICD-10-CM

## 2016-02-20 DIAGNOSIS — J45909 Unspecified asthma, uncomplicated: Secondary | ICD-10-CM | POA: Diagnosis not present

## 2016-02-20 LAB — POCT INR: INR: 2.5

## 2016-02-21 ENCOUNTER — Emergency Department (HOSPITAL_COMMUNITY)
Admission: EM | Admit: 2016-02-21 | Discharge: 2016-02-21 | Disposition: A | Discharge status: Home / Self Care | Payer: Medicare Other | Attending: Emergency Medicine | Admitting: Emergency Medicine

## 2016-02-21 ENCOUNTER — Emergency Department (HOSPITAL_COMMUNITY): Payer: Medicare Other

## 2016-02-21 ENCOUNTER — Encounter (HOSPITAL_COMMUNITY): Payer: Self-pay | Admitting: *Deleted

## 2016-02-21 DIAGNOSIS — Z853 Personal history of malignant neoplasm of breast: Secondary | ICD-10-CM | POA: Diagnosis not present

## 2016-02-21 DIAGNOSIS — J069 Acute upper respiratory infection, unspecified: Secondary | ICD-10-CM | POA: Diagnosis not present

## 2016-02-21 DIAGNOSIS — R0789 Other chest pain: Secondary | ICD-10-CM | POA: Diagnosis not present

## 2016-02-21 DIAGNOSIS — Z79899 Other long term (current) drug therapy: Secondary | ICD-10-CM | POA: Diagnosis not present

## 2016-02-21 DIAGNOSIS — J449 Chronic obstructive pulmonary disease, unspecified: Secondary | ICD-10-CM | POA: Diagnosis not present

## 2016-02-21 DIAGNOSIS — E039 Hypothyroidism, unspecified: Secondary | ICD-10-CM | POA: Diagnosis not present

## 2016-02-21 DIAGNOSIS — Z7901 Long term (current) use of anticoagulants: Secondary | ICD-10-CM | POA: Diagnosis not present

## 2016-02-21 DIAGNOSIS — I10 Essential (primary) hypertension: Secondary | ICD-10-CM | POA: Insufficient documentation

## 2016-02-21 DIAGNOSIS — Z950 Presence of cardiac pacemaker: Secondary | ICD-10-CM | POA: Insufficient documentation

## 2016-02-21 DIAGNOSIS — R05 Cough: Secondary | ICD-10-CM | POA: Diagnosis not present

## 2016-02-21 LAB — COMPREHENSIVE METABOLIC PANEL
ALT: 17 U/L (ref 14–54)
AST: 25 U/L (ref 15–41)
Albumin: 4.3 g/dL (ref 3.5–5.0)
Alkaline Phosphatase: 56 U/L (ref 38–126)
Anion gap: 9 (ref 5–15)
BUN: 18 mg/dL (ref 6–20)
CO2: 24 mmol/L (ref 22–32)
Calcium: 10.3 mg/dL (ref 8.9–10.3)
Chloride: 110 mmol/L (ref 101–111)
Creatinine, Ser: 1.26 mg/dL — ABNORMAL HIGH (ref 0.44–1.00)
GFR calc Af Amer: 43 mL/min — ABNORMAL LOW (ref >60)
GFR calc non Af Amer: 37 mL/min — ABNORMAL LOW (ref >60)
Glucose, Bld: 110 mg/dL — ABNORMAL HIGH (ref 65–99)
Potassium: 4.6 mmol/L (ref 3.5–5.1)
Sodium: 143 mmol/L (ref 135–145)
Total Bilirubin: 1 mg/dL (ref 0.3–1.2)
Total Protein: 7.4 g/dL (ref 6.5–8.1)

## 2016-02-21 LAB — CBC WITH DIFFERENTIAL/PLATELET
Basophils Absolute: 0 10*3/uL (ref 0.0–0.1)
Basophils Relative: 0 %
Eosinophils Absolute: 0.2 10*3/uL (ref 0.0–0.7)
Eosinophils Relative: 4 %
HCT: 36.7 % (ref 36.0–46.0)
Hemoglobin: 11.9 g/dL — ABNORMAL LOW (ref 12.0–15.0)
Lymphocytes Relative: 24 %
Lymphs Abs: 1.4 10*3/uL (ref 0.7–4.0)
MCH: 28.6 pg (ref 26.0–34.0)
MCHC: 32.4 g/dL (ref 30.0–36.0)
MCV: 88.2 fL (ref 78.0–100.0)
Monocytes Absolute: 0.5 10*3/uL (ref 0.1–1.0)
Monocytes Relative: 9 %
Neutro Abs: 3.6 10*3/uL (ref 1.7–7.7)
Neutrophils Relative %: 63 %
Platelets: 193 10*3/uL (ref 150–400)
RBC: 4.16 MIL/uL (ref 3.87–5.11)
RDW: 14.4 % (ref 11.5–15.5)
WBC: 5.8 10*3/uL (ref 4.0–10.5)

## 2016-02-21 LAB — I-STAT TROPONIN, ED
Troponin i, poc: 0 ng/mL (ref 0.00–0.08)
Troponin i, poc: 0 ng/mL (ref 0.00–0.08)

## 2016-02-21 LAB — PROTIME-INR
INR: 2.24
Prothrombin Time: 25.1 seconds — ABNORMAL HIGH (ref 11.4–15.2)

## 2016-02-21 LAB — D-DIMER, QUANTITATIVE (NOT AT ARMC): D-Dimer, Quant: 0.45 ug/mL-FEU (ref 0.00–0.50)

## 2016-02-21 MED ORDER — BENZONATATE 100 MG PO CAPS
200.0000 mg | ORAL_CAPSULE | Freq: Once | ORAL | Status: AC
  Administered 2016-02-21: 200 mg via ORAL
  Filled 2016-02-21: qty 2

## 2016-02-21 MED ORDER — IPRATROPIUM-ALBUTEROL 0.5-2.5 (3) MG/3ML IN SOLN
3.0000 mL | Freq: Once | RESPIRATORY_TRACT | Status: AC
  Administered 2016-02-21: 3 mL via RESPIRATORY_TRACT
  Filled 2016-02-21: qty 3

## 2016-02-21 MED ORDER — BENZONATATE 100 MG PO CAPS: 100.0000 mg | ORAL_CAPSULE | Freq: Three times a day (TID) | ORAL | 0 refills | Status: DC | PRN

## 2016-02-21 NOTE — ED Notes (Signed)
Pt not in room, pt in xray.  

## 2016-02-21 NOTE — ED Notes (Signed)
Dr. Nanavati at [facility] ?

## 2016-02-21 NOTE — ED Triage Notes (Signed)
Patient presents with c/o cough and every "muscle in her chest hurting".  Unable to cough anything up but sounds congested

## 2016-02-21 NOTE — ED Notes (Signed)
Recently seen at an [facility] for cough and congestion, states, "CXR was normal", prescribed abx (not taken, spilled) and hydrocodone. Describes cough as productive until starting the cough med. Upper airway congestion and noises obvious, LS rhonchi that clears with cough. Reports recent fever abated. Alert, NAD, calm, interactive. Easily provoked cough. Not getting enough air, unable to lie flat. Speaking in shortened phrases.

## 2016-02-21 NOTE — ED Notes (Signed)
Patient ambulated well, on pulse ox while ambulating, lowest O2 of 93% with highest of 98% while walking.
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
## 2016-02-24 ENCOUNTER — Telehealth: Payer: Self-pay | Admitting: Pharmacist Clinician (PhC)/ Clinical Pharmacy Specialist

## 2016-02-24 NOTE — Telephone Encounter (Signed)
Pt LMOM, she was to come in today for INR check, having started clarithromycin on Friday after she left here.    She states that she has chosen not to take the clarithromycin, she dropped the bottle of pills and they spilled everywhere, so she is not going to take any.    Tried to return call, no answer or VM

## 2016-02-26 DIAGNOSIS — R3982 Chronic bladder pain: Secondary | ICD-10-CM | POA: Diagnosis not present

## 2016-02-26 NOTE — Telephone Encounter (Signed)
Clarified that patient did not take clarithromycin.  She is going to kidney MD today and will call should they give her any new meds.  Appt set for INR 4 wks from previous

## 2016-02-27 ENCOUNTER — Encounter (HOSPITAL_COMMUNITY): Payer: Self-pay | Admitting: Family Medicine

## 2016-02-27 ENCOUNTER — Ambulatory Visit (HOSPITAL_COMMUNITY)
Admission: EM | Admit: 2016-02-27 | Discharge: 2016-02-27 | Disposition: A | Discharge status: Home / Self Care | Payer: Medicare Other | Attending: Internal Medicine | Admitting: Internal Medicine

## 2016-02-27 ENCOUNTER — Other Ambulatory Visit: Payer: Self-pay | Admitting: Hematology & Oncology

## 2016-02-27 DIAGNOSIS — J40 Bronchitis, not specified as acute or chronic: Secondary | ICD-10-CM | POA: Diagnosis not present

## 2016-02-27 DIAGNOSIS — R05 Cough: Secondary | ICD-10-CM | POA: Diagnosis not present

## 2016-02-27 DIAGNOSIS — R059 Cough, unspecified: Secondary | ICD-10-CM

## 2016-02-27 MED ORDER — IPRATROPIUM-ALBUTEROL 0.5-2.5 (3) MG/3ML IN SOLN
3.0000 mL | Freq: Once | RESPIRATORY_TRACT | Status: AC
  Administered 2016-02-27: 3 mL via RESPIRATORY_TRACT

## 2016-02-27 MED ORDER — IPRATROPIUM-ALBUTEROL 0.5-2.5 (3) MG/3ML IN SOLN
RESPIRATORY_TRACT | Status: AC
  Filled 2016-02-27: qty 3

## 2016-02-27 MED ORDER — AZITHROMYCIN 250 MG PO TABS: 250.0000 mg | ORAL_TABLET | Freq: Every day | ORAL | 0 refills | Status: DC

## 2016-02-27 MED ORDER — HYDROCODONE-ACETAMINOPHEN 7.5-325 MG/15ML PO SOLN: 10.0000 mL | Freq: Four times a day (QID) | ORAL | 0 refills | Status: DC | PRN

## 2016-02-27 NOTE — ED Triage Notes (Signed)
Pt here for cough, rattling in throat and chest. sts that she has been using cough medication without relief. sts that she has been SOB. sts she has been to the ER, clinic and her doctor and not better.
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
## 2016-03-06 ENCOUNTER — Telehealth: Payer: Self-pay | Admitting: Pharmacist Clinician (PhC)/ Clinical Pharmacy Specialist

## 2016-03-06 NOTE — Telephone Encounter (Signed)
Patient called and LM regarding antibiotic.  Went to ER and was given Zpak, took her last dose on Tuesday.  Wanted to know if she should come for INR check  Assured her that we don't usually have problems with Zpaks, she should continue until her next appointment unless she develops any other problems.  Patient voiced understanding

## 2016-03-17 DIAGNOSIS — J069 Acute upper respiratory infection, unspecified: Secondary | ICD-10-CM | POA: Diagnosis not present

## 2016-03-17 DIAGNOSIS — F411 Generalized anxiety disorder: Secondary | ICD-10-CM | POA: Diagnosis not present

## 2016-03-17 DIAGNOSIS — N183 Chronic kidney disease, stage 3 (moderate): Secondary | ICD-10-CM | POA: Diagnosis not present

## 2016-03-17 DIAGNOSIS — E782 Mixed hyperlipidemia: Secondary | ICD-10-CM | POA: Diagnosis not present

## 2016-03-17 DIAGNOSIS — C7951 Secondary malignant neoplasm of bone: Secondary | ICD-10-CM | POA: Diagnosis not present

## 2016-03-17 DIAGNOSIS — E039 Hypothyroidism, unspecified: Secondary | ICD-10-CM | POA: Diagnosis not present

## 2016-03-17 DIAGNOSIS — I129 Hypertensive chronic kidney disease with stage 1 through stage 4 chronic kidney disease, or unspecified chronic kidney disease: Secondary | ICD-10-CM | POA: Diagnosis not present

## 2016-03-17 DIAGNOSIS — C50919 Malignant neoplasm of unspecified site of unspecified female breast: Secondary | ICD-10-CM | POA: Diagnosis not present

## 2016-03-18 ENCOUNTER — Ambulatory Visit (INDEPENDENT_AMBULATORY_CARE_PROVIDER_SITE_OTHER): Payer: Medicare Other | Admitting: Pharmacist

## 2016-03-18 DIAGNOSIS — I4891 Unspecified atrial fibrillation: Secondary | ICD-10-CM | POA: Diagnosis not present

## 2016-03-18 DIAGNOSIS — Z7901 Long term (current) use of anticoagulants: Secondary | ICD-10-CM | POA: Diagnosis not present

## 2016-03-18 LAB — POCT INR: INR: 2.9

## 2016-03-26 DIAGNOSIS — N179 Acute kidney failure, unspecified: Secondary | ICD-10-CM | POA: Diagnosis not present

## 2016-03-28 ENCOUNTER — Other Ambulatory Visit: Payer: Self-pay | Admitting: Hematology & Oncology

## 2016-04-03 DIAGNOSIS — E039 Hypothyroidism, unspecified: Secondary | ICD-10-CM | POA: Diagnosis not present

## 2016-04-03 DIAGNOSIS — N179 Acute kidney failure, unspecified: Secondary | ICD-10-CM | POA: Diagnosis not present

## 2016-04-03 DIAGNOSIS — N183 Chronic kidney disease, stage 3 (moderate): Secondary | ICD-10-CM | POA: Diagnosis not present

## 2016-04-17 DIAGNOSIS — N179 Acute kidney failure, unspecified: Secondary | ICD-10-CM | POA: Diagnosis not present

## 2016-04-17 DIAGNOSIS — E039 Hypothyroidism, unspecified: Secondary | ICD-10-CM | POA: Diagnosis not present

## 2016-04-26 ENCOUNTER — Other Ambulatory Visit: Payer: Self-pay | Admitting: Hematology & Oncology

## 2016-04-26 DIAGNOSIS — E559 Vitamin D deficiency, unspecified: Secondary | ICD-10-CM

## 2016-04-29 ENCOUNTER — Ambulatory Visit (INDEPENDENT_AMBULATORY_CARE_PROVIDER_SITE_OTHER): Payer: Medicare Other | Admitting: Pharmacist Clinician (PhC)/ Clinical Pharmacy Specialist

## 2016-04-29 ENCOUNTER — Other Ambulatory Visit: Payer: Self-pay | Admitting: Hematology & Oncology

## 2016-04-29 DIAGNOSIS — Z7901 Long term (current) use of anticoagulants: Secondary | ICD-10-CM

## 2016-04-29 DIAGNOSIS — I4891 Unspecified atrial fibrillation: Secondary | ICD-10-CM | POA: Diagnosis not present

## 2016-04-29 LAB — POCT INR: INR: 3

## 2016-05-05 ENCOUNTER — Ambulatory Visit (INDEPENDENT_AMBULATORY_CARE_PROVIDER_SITE_OTHER): Payer: Medicare Other | Admitting: *Deleted

## 2016-05-05 DIAGNOSIS — I495 Sick sinus syndrome: Secondary | ICD-10-CM | POA: Diagnosis not present

## 2016-05-06 NOTE — Progress Notes (Signed)
Remote pacemaker transmission.   

## 2016-05-07 ENCOUNTER — Encounter: Payer: Self-pay | Admitting: Cardiology

## 2016-05-15 ENCOUNTER — Other Ambulatory Visit: Payer: Self-pay | Admitting: Cardiovascular Disease

## 2016-05-25 LAB — CUP PACEART REMOTE DEVICE CHECK
Battery Remaining Longevity: 103 mo
Battery Voltage: 3.02 V
Brady Statistic AP VP Percent: 0.08 %
Brady Statistic AP VS Percent: 86.47 %
Brady Statistic AS VP Percent: 0.01 %
Brady Statistic AS VS Percent: 13.44 %
Brady Statistic RA Percent Paced: 86.55 %
Brady Statistic RV Percent Paced: 0.09 %
Date Time Interrogation Session: 20171128174340
Implantable Lead Implant Date: 20160223
Implantable Lead Implant Date: 20160223
Implantable Lead Location: 753859
Implantable Lead Location: 753860
Implantable Lead Model: 5076
Implantable Lead Model: 5076
Implantable Pulse Generator Implant Date: 20160223
Lead Channel Impedance Value: 361 Ohm
Lead Channel Impedance Value: 399 Ohm
Lead Channel Impedance Value: 456 Ohm
Lead Channel Impedance Value: 456 Ohm
Lead Channel Pacing Threshold Amplitude: 0.875 V
Lead Channel Pacing Threshold Amplitude: 1 V
Lead Channel Pacing Threshold Pulse Width: 0.4 ms
Lead Channel Pacing Threshold Pulse Width: 0.4 ms
Lead Channel Sensing Intrinsic Amplitude: 1.75 mV
Lead Channel Sensing Intrinsic Amplitude: 1.75 mV
Lead Channel Sensing Intrinsic Amplitude: 10.625 mV
Lead Channel Sensing Intrinsic Amplitude: 10.625 mV
Lead Channel Setting Pacing Amplitude: 1.5 V
Lead Channel Setting Pacing Amplitude: 2.5 V
Lead Channel Setting Pacing Pulse Width: 0.4 ms
Lead Channel Setting Sensing Sensitivity: 0.9 mV

## 2016-05-31 ENCOUNTER — Other Ambulatory Visit: Payer: Self-pay | Admitting: Hematology & Oncology

## 2016-05-31 DIAGNOSIS — E559 Vitamin D deficiency, unspecified: Secondary | ICD-10-CM

## 2016-06-02 ENCOUNTER — Other Ambulatory Visit: Payer: Self-pay | Admitting: Hematology & Oncology

## 2016-06-10 ENCOUNTER — Ambulatory Visit (INDEPENDENT_AMBULATORY_CARE_PROVIDER_SITE_OTHER): Payer: Medicare Other | Admitting: Pharmacist Clinician (PhC)/ Clinical Pharmacy Specialist

## 2016-06-10 DIAGNOSIS — I4891 Unspecified atrial fibrillation: Secondary | ICD-10-CM

## 2016-06-10 DIAGNOSIS — Z7901 Long term (current) use of anticoagulants: Secondary | ICD-10-CM | POA: Diagnosis not present

## 2016-06-10 LAB — POCT INR: INR: 3.2

## 2016-06-13 DIAGNOSIS — M25572 Pain in left ankle and joints of left foot: Secondary | ICD-10-CM | POA: Diagnosis not present

## 2016-06-13 DIAGNOSIS — M25472 Effusion, left ankle: Secondary | ICD-10-CM | POA: Diagnosis not present

## 2016-06-15 ENCOUNTER — Ambulatory Visit (HOSPITAL_COMMUNITY)
Admission: RE | Admit: 2016-06-15 | Discharge: 2016-06-15 | Disposition: A | Discharge status: Home / Self Care | Payer: Medicare Other | Source: Ambulatory Visit | Attending: Cardiology | Admitting: Cardiology

## 2016-06-15 ENCOUNTER — Other Ambulatory Visit (HOSPITAL_COMMUNITY): Payer: Self-pay | Admitting: Sports Medicine

## 2016-06-15 DIAGNOSIS — M7989 Other specified soft tissue disorders: Secondary | ICD-10-CM | POA: Diagnosis not present

## 2016-06-15 DIAGNOSIS — M79605 Pain in left leg: Secondary | ICD-10-CM | POA: Diagnosis not present

## 2016-06-15 DIAGNOSIS — I10 Essential (primary) hypertension: Secondary | ICD-10-CM | POA: Diagnosis not present

## 2016-06-15 DIAGNOSIS — R0602 Shortness of breath: Secondary | ICD-10-CM | POA: Diagnosis not present

## 2016-06-15 DIAGNOSIS — Z7901 Long term (current) use of anticoagulants: Secondary | ICD-10-CM | POA: Insufficient documentation

## 2016-06-15 DIAGNOSIS — J449 Chronic obstructive pulmonary disease, unspecified: Secondary | ICD-10-CM | POA: Diagnosis not present

## 2016-06-17 DIAGNOSIS — F411 Generalized anxiety disorder: Secondary | ICD-10-CM | POA: Diagnosis not present

## 2016-06-17 DIAGNOSIS — C7951 Secondary malignant neoplasm of bone: Secondary | ICD-10-CM | POA: Diagnosis not present

## 2016-06-17 DIAGNOSIS — C50919 Malignant neoplasm of unspecified site of unspecified female breast: Secondary | ICD-10-CM | POA: Diagnosis not present

## 2016-06-17 DIAGNOSIS — R600 Localized edema: Secondary | ICD-10-CM | POA: Diagnosis not present

## 2016-06-18 ENCOUNTER — Other Ambulatory Visit: Payer: Self-pay

## 2016-06-18 MED ORDER — DILTIAZEM HCL ER COATED BEADS 240 MG PO CP24: 240.0000 mg | ORAL_CAPSULE | Freq: Every day | ORAL | 1 refills | Status: DC

## 2016-06-19 ENCOUNTER — Other Ambulatory Visit: Payer: Self-pay | Admitting: *Deleted

## 2016-06-22 ENCOUNTER — Ambulatory Visit (HOSPITAL_BASED_OUTPATIENT_CLINIC_OR_DEPARTMENT_OTHER): Payer: Medicare Other | Admitting: Hematology & Oncology

## 2016-06-22 ENCOUNTER — Ambulatory Visit (HOSPITAL_BASED_OUTPATIENT_CLINIC_OR_DEPARTMENT_OTHER): Payer: Medicare Other

## 2016-06-22 ENCOUNTER — Other Ambulatory Visit (HOSPITAL_BASED_OUTPATIENT_CLINIC_OR_DEPARTMENT_OTHER): Payer: Medicare Other

## 2016-06-22 ENCOUNTER — Encounter: Payer: Self-pay | Admitting: Hematology & Oncology

## 2016-06-22 VITALS — BP 150/65 | HR 75 | Temp 98.3°F | Resp 16 | Ht 66.0 in | Wt 177.0 lb

## 2016-06-22 DIAGNOSIS — E038 Other specified hypothyroidism: Secondary | ICD-10-CM | POA: Diagnosis not present

## 2016-06-22 DIAGNOSIS — C50919 Malignant neoplasm of unspecified site of unspecified female breast: Secondary | ICD-10-CM

## 2016-06-22 DIAGNOSIS — C7951 Secondary malignant neoplasm of bone: Secondary | ICD-10-CM

## 2016-06-22 DIAGNOSIS — D509 Iron deficiency anemia, unspecified: Secondary | ICD-10-CM | POA: Diagnosis not present

## 2016-06-22 DIAGNOSIS — T386X5A Adverse effect of antigonadotrophins, antiestrogens, antiandrogens, not elsewhere classified, initial encounter: Secondary | ICD-10-CM

## 2016-06-22 DIAGNOSIS — Z862 Personal history of diseases of the blood and blood-forming organs and certain disorders involving the immune mechanism: Secondary | ICD-10-CM

## 2016-06-22 DIAGNOSIS — C50912 Malignant neoplasm of unspecified site of left female breast: Secondary | ICD-10-CM

## 2016-06-22 DIAGNOSIS — M818 Other osteoporosis without current pathological fracture: Secondary | ICD-10-CM

## 2016-06-22 LAB — CBC WITH DIFFERENTIAL (CANCER CENTER ONLY)
BASO#: 0 10*3/uL (ref 0.0–0.2)
BASO%: 1 % (ref 0.0–2.0)
EOS%: 3.7 % (ref 0.0–7.0)
Eosinophils Absolute: 0.2 10*3/uL (ref 0.0–0.5)
HCT: 35.9 % (ref 34.8–46.6)
HGB: 11.7 g/dL (ref 11.6–15.9)
LYMPH#: 1.1 10*3/uL (ref 0.9–3.3)
LYMPH%: 27.4 % (ref 14.0–48.0)
MCH: 29.2 pg (ref 26.0–34.0)
MCHC: 32.6 g/dL (ref 32.0–36.0)
MCV: 90 fL (ref 81–101)
MONO#: 0.4 10*3/uL (ref 0.1–0.9)
MONO%: 9.5 % (ref 0.0–13.0)
NEUT#: 2.4 10*3/uL (ref 1.5–6.5)
NEUT%: 58.4 % (ref 39.6–80.0)
Platelets: 200 10*3/uL (ref 145–400)
RBC: 4.01 10*6/uL (ref 3.70–5.32)
RDW: 13.4 % (ref 11.1–15.7)
WBC: 4 10*3/uL (ref 3.9–10.0)

## 2016-06-22 LAB — CMP (CANCER CENTER ONLY)
ALT(SGPT): 18 U/L (ref 10–47)
AST: 21 U/L (ref 11–38)
Albumin: 3.8 g/dL (ref 3.3–5.5)
Alkaline Phosphatase: 65 U/L (ref 26–84)
BUN, Bld: 12 mg/dL (ref 7–22)
CO2: 28 mEq/L (ref 18–33)
Calcium: 9 mg/dL (ref 8.0–10.3)
Chloride: 107 mEq/L (ref 98–108)
Creat: 0.9 mg/dl (ref 0.6–1.2)
Glucose, Bld: 111 mg/dL (ref 73–118)
Potassium: 4.3 mEq/L (ref 3.3–4.7)
Sodium: 141 mEq/L (ref 128–145)
Total Bilirubin: 0.9 mg/dl (ref 0.20–1.60)
Total Protein: 6.6 g/dL (ref 6.4–8.1)

## 2016-06-22 MED ORDER — DENOSUMAB 120 MG/1.7ML ~~LOC~~ SOLN
120.0000 mg | Freq: Once | SUBCUTANEOUS | Status: AC
  Administered 2016-06-22: 120 mg via SUBCUTANEOUS
  Filled 2016-06-22: qty 1.7

## 2016-06-22 NOTE — Patient Instructions (Signed)
Denosumab injection What is this medicine? DENOSUMAB (den oh sue mab) slows bone breakdown. Prolia is used to treat osteoporosis in women after menopause and in men. Xgeva is used to prevent bone fractures and other bone problems caused by cancer bone metastases. Xgeva is also used to treat giant cell tumor of the bone. COMMON BRAND NAME(S): Prolia, XGEVA What should I tell my health care provider before I take this medicine? They need to know if you have any of these conditions: -dental disease -eczema -infection or history of infections -kidney disease or on dialysis -low blood calcium or vitamin D -malabsorption syndrome -scheduled to have surgery or tooth extraction -taking medicine that contains denosumab -thyroid or parathyroid disease -an unusual reaction to denosumab, other medicines, foods, dyes, or preservatives -pregnant or trying to get pregnant -breast-feeding How should I use this medicine? This medicine is for injection under the skin. It is given by a health care professional in a hospital or clinic setting. If you are getting Prolia, a special MedGuide will be given to you by the pharmacist with each prescription and refill. Be sure to read this information carefully each time. For Prolia, talk to your pediatrician regarding the use of this medicine in children. Special care may be needed. For Xgeva, talk to your pediatrician regarding the use of this medicine in children. While this drug may be prescribed for children as young as 13 years for selected conditions, precautions do apply. What if I miss a dose? It is important not to miss your dose. Call your doctor or health care professional if you are unable to keep an appointment. What may interact with this medicine? Do not take this medicine with any of the following medications: -other medicines containing denosumab This medicine may also interact with the following medications: -medicines that suppress the immune  system -medicines that treat cancer -steroid medicines like prednisone or cortisone What should I watch for while using this medicine? Visit your doctor or health care professional for regular checks on your progress. Your doctor or health care professional may order blood tests and other tests to see how you are doing. Call your doctor or health care professional if you get a cold or other infection while receiving this medicine. Do not treat yourself. This medicine may decrease your body's ability to fight infection. You should make sure you get enough calcium and vitamin D while you are taking this medicine, unless your doctor tells you not to. Discuss the foods you eat and the vitamins you take with your health care professional. See your dentist regularly. Brush and floss your teeth as directed. Before you have any dental work done, tell your dentist you are receiving this medicine. Do not become pregnant while taking this medicine or for 5 months after stopping it. Women should inform their doctor if they wish to become pregnant or think they might be pregnant. There is a potential for serious side effects to an unborn child. Talk to your health care professional or pharmacist for more information. What side effects may I notice from receiving this medicine? Side effects that you should report to your doctor or health care professional as soon as possible: -allergic reactions like skin rash, itching or hives, swelling of the face, lips, or tongue -breathing problems -chest pain -fast, irregular heartbeat -feeling faint or lightheaded, falls -fever, chills, or any other sign of infection -muscle spasms, tightening, or twitches -numbness or tingling -skin blisters or bumps, or is dry, peels, or red -slow   healing or unexplained pain in the mouth or jaw -unusual bleeding or bruising Side effects that usually do not require medical attention (report to your doctor or health care professional  if they continue or are bothersome): -muscle pain -stomach upset, gas Where should I keep my medicine? This medicine is only given in a clinic, doctor's office, or other health care setting and will not be stored at home.  2017 Elsevier/Gold Standard (2015-06-27 10:06:55)  

## 2016-06-22 NOTE — Progress Notes (Signed)
Hematology and Oncology Follow Up Visit  Amy White [id number] July 06, 1925 83 y.o. 06/22/2016   Principle Diagnosis:  Metastatic breast cancer, bone metastasis only.  Current Therapy:   1. Femara 2.5 mg p.o. daily. 2. Xgeva 120mg  sq q 3 month     Interim History:  Ms.  White is for followup. She is very late in making her follow-up. We typically have seen her every 3 months. His now been 6 months. She is complaining of more pain over in the right anterior lower rib area. This is where she has known own disease.  We last saw her, her CA 27.29 was 48. This was coming down.  She does not recall any trauma. She has not fallen.  She does have cardiac issues. She is on Cardizem.  She has had no weight loss or weight gain. She had no problems over the holidays.  Her last bone scan I think was back in February of last year.  Overall, her performance status is [name]   Medications:  Current Outpatient Prescriptions:  .  albuterol (PROVENTIL HFA;VENTOLIN HFA) 108 (90 BASE) MCG/ACT inhaler, Inhale 2 puffs into the lungs every 6 (six) hours as needed for wheezing or shortness of breath., Disp: , Rfl:  .  ALPRAZolam (XANAX) 0.5 MG tablet, Take 0.25 mg by mouth 2 (two) times daily as needed for anxiety. , Disp: , Rfl: 0 .  diclofenac sodium (VOLTAREN) 1 % GEL, Apply 1 application topically daily as needed (pain). , Disp: , Rfl: 1 .  diltiazem (CARDIZEM CD) 240 MG 24 hr capsule, Take 1 capsule (240 mg total) by mouth daily., Disp: 30 capsule, Rfl: 1 .  HYDROcodone-acetaminophen (HYCET) 7.5-325 mg/15 ml solution, Take 10 mLs by mouth 4 (four) times daily as needed for moderate pain., Disp: 120 mL, Rfl: 0 .  HYDROcodone-acetaminophen (NORCO) 5-325 MG tablet, Take 1 tablet by mouth every 6 (six) hours as needed for moderate pain., Disp: 20 tablet, Rfl: 0 .  letrozole (FEMARA) 2.5 MG tablet, TAKE ONE TABLET BY MOUTH ONCE DAILY, Disp: 30 tablet, Rfl: 0 .  levothyroxine (SYNTHROID, LEVOTHROID) 75  MCG tablet, Take 75 mg by mouth Daily., Disp: , Rfl:  .  tetrahydrozoline-zinc (VISINE-AC) 0.05-0.25 % ophthalmic solution, Place 2 drops into both eyes 3 (three) times daily as needed (red eyes)., Disp: , Rfl:  .  warfarin (COUMADIN) 5 MG tablet, TAKE ONE-HALF TO 1 TABLET BY MOUTH DAILY AS DIRECTED BY COUMADIN CLINIC, Disp: 90 tablet, Rfl: 0  Allergies:  Allergies  Allergen Reactions  . Crestor [Rosuvastatin]   . Ciprofloxacin Rash    Rash in the mouth  . Penicillins Hives and Rash  . Vancomycin Rash    "Red-man"    Past Medical History, Surgical history, Social history, and Family History were reviewed and updated.  Review of Systems: As above  Physical Exam:  height is 5\' 6"  (1.676 m) and weight is 177 lb (80.3 kg). Her oral temperature is 98.3 F (36.8 C). Her blood pressure is 150/65 (abnormal) and her pulse is 75. Her respiration is 16.   Elderly, thin but well-nourished white female. Head and neck exam shows no ocular or oral lesions. There are no palpable cervical or supraclavicular lymph nodes. Lungs are clear. Cardiac exam regular rate and rhythm with a normal S1 and S2. There are no murmurs, rubs or bruits.. Abdomen is soft. She has good bowels. There is no fluid wave. There is no palpable liver or spleen tip. She has the healing  right inguinal surgery scar. There is no erythema associated with this.. Back exam shows some slight kyphosis. No tenderness noted over the spine. She has slight tenderness over the right anterior pelvic region. Extremities shows significant osteoarthritic changes in her joints. She has some age related muscle atrophy. Neurological exam is nonfocal. Lymph node exam shows no adenopathy.   Lab Results  Component Value Date   WBC 4.0 06/22/2016   HGB 11.7 06/22/2016   HCT 35.9 06/22/2016   MCV 90 06/22/2016   PLT 200 06/22/2016     Chemistry      Component Value Date/Time   NA 141 06/22/2016 1255   NA 142 07/31/2015 1203   K 4.3 06/22/2016 1255    K 5.1 07/31/2015 1203   CL 107 06/22/2016 1255   CO2 28 06/22/2016 1255   CO2 23 07/31/2015 1203   BUN 12 06/22/2016 1255   BUN 23.3 07/31/2015 1203   CREATININE 0.9 06/22/2016 1255   CREATININE 1.1 07/31/2015 1203      Component Value Date/Time   CALCIUM 9.0 06/22/2016 1255   CALCIUM 9.3 07/31/2015 1203   ALKPHOS 65 06/22/2016 1255   ALKPHOS 57 07/31/2015 1203   AST 21 06/22/2016 1255   AST 18 07/31/2015 1203   ALT 18 06/22/2016 1255   ALT 15 07/31/2015 1203   BILITOT 0.90 06/22/2016 1255   BILITOT 0.96 07/31/2015 1203         Impression and Plan: Amy White is a 83 year old white female with metastatic breast cancer. Her disease is confined to her bones. She has been stable. She has been stable now for about 10 years. She's done incredibly well.   She had a bone scan done. We will see what the bone scan shows. I think this will help [place] out.  If we do find that she has progressive bone metastases, then I will add one of the CDK4/CDK6 agents. I did this would be the next step in her treatment algorithm.   We will go ahead and give her Xgeva today. I think she clearly will need this.   I will like to see her back in one month.   I spent about 30 minutes with her. It was nice to see her again.   Volanda Napoleon, MD 1/15/20182:01 PM

## 2016-06-23 ENCOUNTER — Telehealth: Payer: Self-pay | Admitting: *Deleted

## 2016-06-23 LAB — FERRITIN: Ferritin: 12 ng/ml (ref 9–269)

## 2016-06-23 LAB — IRON AND TIBC
%SAT: 13 % — ABNORMAL LOW (ref 21–57)
Iron: 50 ug/dL (ref 41–142)
TIBC: 369 ug/dL (ref 236–444)
UIBC: 319 ug/dL (ref 120–384)

## 2016-06-23 LAB — CANCER ANTIGEN 27.29: CA 27.29: 40.8 U/mL — ABNORMAL HIGH (ref 0.0–38.6)

## 2016-06-23 LAB — TSH: TSH: 1.796 m(IU)/L (ref 0.308–3.960)

## 2016-06-23 LAB — CANCER ANTIGEN 27-29 (PARALLEL TESTING): CA 27.29: 49 U/mL — ABNORMAL HIGH (ref <38)

## 2016-06-23 NOTE — Telephone Encounter (Addendum)
Patient is aware of results. Appointment made  ----- Message from Volanda Napoleon, MD sent at 06/23/2016 10:11 AM EST ----- Call - your iron level is low again!!  Need 1 dose of Feraheme!!  This may help your rib pain!!!  plese set up this or next week!!  pete

## 2016-06-25 ENCOUNTER — Ambulatory Visit: Payer: Medicare Other

## 2016-06-30 ENCOUNTER — Other Ambulatory Visit: Payer: Self-pay | Admitting: Family

## 2016-06-30 ENCOUNTER — Ambulatory Visit (HOSPITAL_BASED_OUTPATIENT_CLINIC_OR_DEPARTMENT_OTHER): Payer: Medicare Other

## 2016-06-30 VITALS — BP 135/61 | HR 58 | Temp 98.1°F | Resp 18

## 2016-06-30 DIAGNOSIS — C7951 Secondary malignant neoplasm of bone: Principal | ICD-10-CM

## 2016-06-30 DIAGNOSIS — D509 Iron deficiency anemia, unspecified: Secondary | ICD-10-CM

## 2016-06-30 DIAGNOSIS — C50919 Malignant neoplasm of unspecified site of unspecified female breast: Secondary | ICD-10-CM

## 2016-06-30 MED ORDER — SODIUM CHLORIDE 0.9% FLUSH
3.0000 mL | Freq: Once | INTRAVENOUS | Status: DC | PRN
Start: 2016-06-30 — End: 2016-06-30
  Filled 2016-06-30: qty 10

## 2016-06-30 MED ORDER — SODIUM CHLORIDE 0.9 % IV SOLN
Freq: Once | INTRAVENOUS | Status: AC
  Administered 2016-06-30: 14:00:00 via INTRAVENOUS

## 2016-06-30 MED ORDER — ALTEPLASE 2 MG IJ SOLR
2.0000 mg | Freq: Once | INTRAMUSCULAR | Status: DC | PRN
  Filled 2016-06-30: qty 2

## 2016-06-30 MED ORDER — HEPARIN SOD (PORK) LOCK FLUSH 100 UNIT/ML IV SOLN
250.0000 [IU] | Freq: Once | INTRAVENOUS | Status: DC | PRN
  Filled 2016-06-30: qty 5

## 2016-06-30 MED ORDER — SODIUM CHLORIDE 0.9 % IV SOLN
510.0000 mg | Freq: Once | INTRAVENOUS | Status: AC
  Administered 2016-06-30: 510 mg via INTRAVENOUS
  Filled 2016-06-30: qty 17

## 2016-06-30 MED ORDER — SODIUM CHLORIDE 0.9% FLUSH
10.0000 mL | INTRAVENOUS | Status: DC | PRN
  Filled 2016-06-30: qty 10

## 2016-06-30 MED ORDER — HEPARIN SOD (PORK) LOCK FLUSH 100 UNIT/ML IV SOLN
500.0000 [IU] | Freq: Once | INTRAVENOUS | Status: DC | PRN
  Filled 2016-06-30: qty 5

## 2016-07-01 ENCOUNTER — Ambulatory Visit (INDEPENDENT_AMBULATORY_CARE_PROVIDER_SITE_OTHER): Payer: Medicare Other | Admitting: Pharmacist

## 2016-07-01 DIAGNOSIS — Z7901 Long term (current) use of anticoagulants: Secondary | ICD-10-CM

## 2016-07-01 DIAGNOSIS — I4891 Unspecified atrial fibrillation: Secondary | ICD-10-CM

## 2016-07-01 LAB — POCT INR: INR: 2.4

## 2016-07-03 ENCOUNTER — Encounter (HOSPITAL_COMMUNITY): Payer: Medicare Other

## 2016-07-03 ENCOUNTER — Encounter (HOSPITAL_COMMUNITY)
Admission: RE | Admit: 2016-07-03 | Discharge: 2016-07-03 | Disposition: A | Discharge status: Home / Self Care | Payer: Medicare Other | Source: Ambulatory Visit | Attending: Hematology & Oncology | Admitting: Hematology & Oncology

## 2016-07-03 DIAGNOSIS — M818 Other osteoporosis without current pathological fracture: Secondary | ICD-10-CM | POA: Diagnosis not present

## 2016-07-03 DIAGNOSIS — C7951 Secondary malignant neoplasm of bone: Secondary | ICD-10-CM | POA: Insufficient documentation

## 2016-07-03 DIAGNOSIS — X58XXXA Exposure to other specified factors, initial encounter: Secondary | ICD-10-CM | POA: Diagnosis not present

## 2016-07-03 DIAGNOSIS — C50919 Malignant neoplasm of unspecified site of unspecified female breast: Secondary | ICD-10-CM | POA: Diagnosis not present

## 2016-07-03 DIAGNOSIS — T386X5A Adverse effect of antigonadotrophins, antiestrogens, antiandrogens, not elsewhere classified, initial encounter: Secondary | ICD-10-CM | POA: Insufficient documentation

## 2016-07-03 MED ORDER — FLUDEOXYGLUCOSE F - 18 (FDG) INJECTION: 19.0900 | Freq: Once | INTRAVENOUS | Status: DC | PRN

## 2016-07-03 MED ORDER — TECHNETIUM TC 99M MEDRONATE IV KIT: 25.0000 | PACK | Freq: Once | INTRAVENOUS | Status: DC | PRN

## 2016-07-04 ENCOUNTER — Other Ambulatory Visit: Payer: Self-pay | Admitting: Hematology & Oncology

## 2016-07-06 ENCOUNTER — Telehealth: Payer: Self-pay | Admitting: *Deleted

## 2016-07-06 NOTE — Telephone Encounter (Addendum)
Patient aware of results  ----- Message from Volanda Napoleon, MD sent at 07/04/2016  8:04 AM EST ----- Call - bone scan is stable!!  No new areas of cancer seen in the bones!!  This is great news!!!  pete

## 2016-07-14 DIAGNOSIS — M25471 Effusion, right ankle: Secondary | ICD-10-CM | POA: Diagnosis not present

## 2016-07-14 DIAGNOSIS — M19172 Post-traumatic osteoarthritis, left ankle and foot: Secondary | ICD-10-CM | POA: Diagnosis not present

## 2016-07-14 DIAGNOSIS — M25572 Pain in left ankle and joints of left foot: Secondary | ICD-10-CM | POA: Diagnosis not present

## 2016-07-14 DIAGNOSIS — M25472 Effusion, left ankle: Secondary | ICD-10-CM | POA: Diagnosis not present

## 2016-07-17 ENCOUNTER — Other Ambulatory Visit: Payer: Self-pay | Admitting: Cardiovascular Disease

## 2016-07-22 ENCOUNTER — Ambulatory Visit (INDEPENDENT_AMBULATORY_CARE_PROVIDER_SITE_OTHER): Payer: Medicare Other | Admitting: Pharmacist

## 2016-07-22 ENCOUNTER — Encounter: Payer: Self-pay | Admitting: Cardiovascular Disease

## 2016-07-22 ENCOUNTER — Ambulatory Visit (INDEPENDENT_AMBULATORY_CARE_PROVIDER_SITE_OTHER): Payer: Medicare Other | Admitting: Cardiovascular Disease

## 2016-07-22 VITALS — BP 130/60 | HR 78 | Ht 66.0 in | Wt 174.0 lb

## 2016-07-22 DIAGNOSIS — Z950 Presence of cardiac pacemaker: Secondary | ICD-10-CM

## 2016-07-22 DIAGNOSIS — I4819 Other persistent atrial fibrillation: Secondary | ICD-10-CM

## 2016-07-22 DIAGNOSIS — Z7901 Long term (current) use of anticoagulants: Secondary | ICD-10-CM

## 2016-07-22 DIAGNOSIS — I4891 Unspecified atrial fibrillation: Secondary | ICD-10-CM | POA: Diagnosis not present

## 2016-07-22 DIAGNOSIS — I481 Persistent atrial fibrillation: Secondary | ICD-10-CM | POA: Diagnosis not present

## 2016-07-22 DIAGNOSIS — I495 Sick sinus syndrome: Secondary | ICD-10-CM

## 2016-07-22 LAB — CUP PACEART INCLINIC DEVICE CHECK
Battery Remaining Longevity: 102 mo
Battery Voltage: 3.02 V
Brady Statistic AP VP Percent: 0.07 %
Brady Statistic AP VS Percent: 80.78 %
Brady Statistic AS VP Percent: 0.05 %
Brady Statistic AS VS Percent: 19.1 %
Brady Statistic RA Percent Paced: 80.77 %
Brady Statistic RV Percent Paced: 0.12 %
Date Time Interrogation Session: 20180214153849
Implantable Lead Implant Date: 20160223
Implantable Lead Implant Date: 20160223
Implantable Lead Location: 753859
Implantable Lead Location: 753860
Implantable Lead Model: 5076
Implantable Lead Model: 5076
Implantable Pulse Generator Implant Date: 20160223
Lead Channel Impedance Value: 361 Ohm
Lead Channel Impedance Value: 418 Ohm
Lead Channel Impedance Value: 456 Ohm
Lead Channel Impedance Value: 494 Ohm
Lead Channel Pacing Threshold Amplitude: 0.75 V
Lead Channel Pacing Threshold Amplitude: 0.875 V
Lead Channel Pacing Threshold Pulse Width: 0.4 ms
Lead Channel Pacing Threshold Pulse Width: 0.4 ms
Lead Channel Sensing Intrinsic Amplitude: 1.625 mV
Lead Channel Sensing Intrinsic Amplitude: 11 mV
Lead Channel Sensing Intrinsic Amplitude: 13.75 mV
Lead Channel Sensing Intrinsic Amplitude: 2.875 mV
Lead Channel Setting Pacing Amplitude: 1.5 V
Lead Channel Setting Pacing Amplitude: 3 V
Lead Channel Setting Pacing Pulse Width: 0.4 ms
Lead Channel Setting Sensing Sensitivity: 0.9 mV

## 2016-07-22 LAB — POCT INR: INR: 2.1
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
## 2016-07-22 NOTE — Patient Instructions (Addendum)
Dr Sallyanne Kuster recommends that you continue on your current medications as directed. Please refer to the Current Medication list given to you today.  Remote monitoring is used to monitor your Pacemaker of ICD from home. This monitoring reduces the number of office visits required to check your device to one time per year. It allows [place] to keep an eye on the functioning of your device to ensure it is working properly. You are scheduled for a device check from home on Wednesday, May 16th, 2018. You may send your transmission at any time that day. If you have a wireless device, the transmission will be sent automatically. After your physician reviews your transmission, you will receive a postcard with your next transmission date.  Dr Sallyanne Kuster recommends that you schedule a follow-up appointment in 12 months with a pacemaker check. You will receive a reminder letter in the mail two months in advance. If you don't receive a letter, please call our office to schedule the follow-up appointment.  If you need a refill on your cardiac medications before your next appointment, please call your pharmacy.   Today's INR - 2.1  I will have our pharmacist, Raquel, call you with any changes to your coumadin.

## 2016-07-27 ENCOUNTER — Ambulatory Visit (HOSPITAL_BASED_OUTPATIENT_CLINIC_OR_DEPARTMENT_OTHER): Payer: Medicare Other | Admitting: Hematology & Oncology

## 2016-07-27 ENCOUNTER — Other Ambulatory Visit (HOSPITAL_BASED_OUTPATIENT_CLINIC_OR_DEPARTMENT_OTHER): Payer: Medicare Other

## 2016-07-27 VITALS — BP 138/70 | HR 93 | Temp 98.0°F | Resp 20 | Wt 175.0 lb

## 2016-07-27 DIAGNOSIS — C50911 Malignant neoplasm of unspecified site of right female breast: Secondary | ICD-10-CM

## 2016-07-27 DIAGNOSIS — D509 Iron deficiency anemia, unspecified: Secondary | ICD-10-CM | POA: Diagnosis not present

## 2016-07-27 DIAGNOSIS — M818 Other osteoporosis without current pathological fracture: Secondary | ICD-10-CM

## 2016-07-27 DIAGNOSIS — C7951 Secondary malignant neoplasm of bone: Secondary | ICD-10-CM

## 2016-07-27 DIAGNOSIS — T386X5A Adverse effect of antigonadotrophins, antiestrogens, antiandrogens, not elsewhere classified, initial encounter: Secondary | ICD-10-CM

## 2016-07-27 DIAGNOSIS — C50919 Malignant neoplasm of unspecified site of unspecified female breast: Secondary | ICD-10-CM

## 2016-07-27 LAB — CBC WITH DIFFERENTIAL (CANCER CENTER ONLY)
BASO#: 0 10*3/uL (ref 0.0–0.2)
BASO%: 0.5 % (ref 0.0–2.0)
EOS%: 2.1 % (ref 0.0–7.0)
Eosinophils Absolute: 0.1 10*3/uL (ref 0.0–0.5)
HCT: 40 % (ref 34.8–46.6)
HGB: 13.3 g/dL (ref 11.6–15.9)
LYMPH#: 1.2 10*3/uL (ref 0.9–3.3)
LYMPH%: 30.2 % (ref 14.0–48.0)
MCH: 29.6 pg (ref 26.0–34.0)
MCHC: 33.3 g/dL (ref 32.0–36.0)
MCV: 89 fL (ref 81–101)
MONO#: 0.3 10*3/uL (ref 0.1–0.9)
MONO%: 8.2 % (ref 0.0–13.0)
NEUT#: 2.3 10*3/uL (ref 1.5–6.5)
NEUT%: 59 % (ref 39.6–80.0)
Platelets: 195 10*3/uL (ref 145–400)
RBC: 4.49 10*6/uL (ref 3.70–5.32)
RDW: 14.7 % (ref 11.1–15.7)
WBC: 3.9 10*3/uL (ref 3.9–10.0)

## 2016-07-27 LAB — COMPREHENSIVE METABOLIC PANEL (CC13)
ALT: 12 IU/L (ref 0–32)
AST (SGOT): 19 IU/L (ref 0–40)
Albumin, Serum: 4.2 g/dL (ref 3.5–4.7)
Albumin/Globulin Ratio: 1.6 (ref 1.2–2.2)
Alkaline Phosphatase, S: 64 IU/L (ref 39–117)
BUN/Creatinine Ratio: 15 (ref 12–28)
BUN: 15 mg/dL (ref 8–27)
Bilirubin Total: 0.6 mg/dL (ref 0.0–1.2)
Calcium, Ser: 9.5 mg/dL (ref 8.7–10.3)
Carbon Dioxide, Total: 25 mmol/L (ref 18–29)
Chloride, Ser: 104 mmol/L (ref 96–106)
Creatinine, Ser: 0.97 mg/dL (ref 0.57–1.00)
GFR calc Af Amer: 60 mL/min/{1.73_m2} (ref >59)
GFR calc non Af Amer: 52 mL/min/{1.73_m2} — ABNORMAL LOW (ref >59)
Globulin, Total: 2.6 g/dL (ref 1.5–4.5)
Glucose: 83 mg/dL (ref 65–99)
Potassium, Ser: 3.9 mmol/L (ref 3.5–5.2)
Sodium: 137 mmol/L (ref 134–144)
Total Protein: 6.8 g/dL (ref 6.0–8.5)

## 2016-07-27 NOTE — Progress Notes (Signed)
Hematology and Oncology Follow Up Visit  Amy White [id number] 02/23/1926 83 y.o. 07/27/2016   Principle Diagnosis:  Metastatic breast cancer, bone metastasis only.  Current Therapy:   1. Femara 2.5 mg p.o. daily. 2. Delton See 120mg  sq q 6 month     Interim History:  [name] is for followup. She is looking quite good. We saw her back in January. We did go ahead and get a bone scan on her. The bone scan was done in late January. Everything looked stable. She had uptake in the T12 vertebral body, right iliac crest and right anterior rib. This all has been noted before.  She is not complaining of any pain. She does have bad arthritis second cause her some problems.  Her last CA 27.29 was holding steady at 41.  She's had no fever. She's had no cough or shortness of breath. She's had no change in bowel or bladder habits.  There's been no rashes. She's had no leg swelling. She's had no cough.   Thankfully, she's got through the flu season without any infection.   Overall, her performance status is [name]   Medications:  Current Outpatient Prescriptions:  .  albuterol (PROVENTIL HFA;VENTOLIN HFA) 108 (90 BASE) MCG/ACT inhaler, Inhale 2 puffs into the lungs every 6 (six) hours as needed for wheezing or shortness of breath., Disp: , Rfl:  .  ALPRAZolam (XANAX) 0.5 MG tablet, Take 0.25 mg by mouth 2 (two) times daily as needed for anxiety. , Disp: , Rfl: 0 .  diclofenac sodium (VOLTAREN) 1 % GEL, Apply 1 application topically daily as needed (pain). , Disp: , Rfl: 1 .  diltiazem (CARDIZEM CD) 240 MG 24 hr capsule, Take 1 capsule (240 mg total) by mouth daily., Disp: 30 capsule, Rfl: 6 .  HYDROcodone-acetaminophen (NORCO) 5-325 MG tablet, Take 1 tablet by mouth every 6 (six) hours as needed for moderate pain., Disp: 20 tablet, Rfl: 0 .  letrozole (FEMARA) 2.5 MG tablet, TAKE ONE TABLET BY MOUTH ONCE DAILY, Disp: 30 tablet, Rfl: 0 .  levothyroxine (SYNTHROID, LEVOTHROID) 75 MCG tablet,  Take 75 mg by mouth Daily., Disp: , Rfl:  .  tetrahydrozoline-zinc (VISINE-AC) 0.05-0.25 % ophthalmic solution, Place 2 drops into both eyes 3 (three) times daily as needed (red eyes)., Disp: , Rfl:  .  warfarin (COUMADIN) 5 MG tablet, TAKE ONE-HALF TO 1 TABLET BY MOUTH DAILY AS DIRECTED BY COUMADIN CLINIC, Disp: 90 tablet, Rfl: 0  Allergies:  Allergies  Allergen Reactions  . Crestor [Rosuvastatin]   . Ciprofloxacin Rash    Rash in the mouth  . Penicillins Hives and Rash  . Vancomycin Rash    "Red-man"    Past Medical History, Surgical history, Social history, and Family History were reviewed and updated.  Review of Systems: As above  Physical Exam:  weight is 175 lb (79.4 kg). Her oral temperature is 98 F (36.7 C). Her blood pressure is 138/70 and her pulse is 93. Her respiration is 20 and oxygen saturation is 96%.   Elderly, thin but well-nourished white female. Head and neck exam shows no ocular or oral lesions. There are no palpable cervical or supraclavicular lymph nodes. Lungs are clear. Cardiac exam regular rate and rhythm with a normal S1 and S2. There are no murmurs, rubs or bruits.. Abdomen is soft. She has good bowels. There is no fluid wave. There is no palpable liver or spleen tip. She has the healing right inguinal surgery scar. There is no erythema  associated with this.. Back exam shows some slight kyphosis. No tenderness noted over the spine. She has slight tenderness over the right anterior pelvic region. Extremities shows significant osteoarthritic changes in her joints. She has some age related muscle atrophy. Neurological exam is nonfocal. Lymph node exam shows no adenopathy.   Lab Results  Component Value Date   WBC 3.9 07/27/2016   HGB 13.3 07/27/2016   HCT 40.0 07/27/2016   MCV 89 07/27/2016   PLT 195 07/27/2016     Chemistry      Component Value Date/Time   NA 141 06/22/2016 1255   NA 142 07/31/2015 1203   K 4.3 06/22/2016 1255   K 5.1 07/31/2015 1203    CL 107 06/22/2016 1255   CO2 28 06/22/2016 1255   CO2 23 07/31/2015 1203   BUN 12 06/22/2016 1255   BUN 23.3 07/31/2015 1203   CREATININE 0.9 06/22/2016 1255   CREATININE 1.1 07/31/2015 1203      Component Value Date/Time   CALCIUM 9.0 06/22/2016 1255   CALCIUM 9.3 07/31/2015 1203   ALKPHOS 65 06/22/2016 1255   ALKPHOS 57 07/31/2015 1203   AST 21 06/22/2016 1255   AST 18 07/31/2015 1203   ALT 18 06/22/2016 1255   ALT 15 07/31/2015 1203   BILITOT 0.90 06/22/2016 1255   BILITOT 0.96 07/31/2015 1203         Impression and Plan: Amy White is a 83 year old white female with metastatic breast cancer. Her disease is confined to her bones. She has been stable. She has been stable now for about 10 years. She's done incredibly well.   We will move her Xgeva appointment out to every 6 months now. I think this would be reasonable. It will certainly help her out.  I will plan to get her back in 6 months.  I spent  about 30 minutes with her. It was nice to see her again.   Volanda Napoleon, MD 2/19/20184:03 PM

## 2016-07-28 LAB — CANCER ANTIGEN 27.29: CA 27.29: 52.7 U/mL — ABNORMAL HIGH (ref 0.0–38.6)

## 2016-07-28 LAB — VITAMIN D 25 HYDROXY (VIT D DEFICIENCY, FRACTURES): Vitamin D, 25-Hydroxy: 35.9 ng/mL (ref 30.0–100.0)

## 2016-07-28 LAB — LACTATE DEHYDROGENASE: LDH: 200 U/L (ref 125–245)

## 2016-08-04 ENCOUNTER — Other Ambulatory Visit: Payer: Self-pay | Admitting: Hematology & Oncology

## 2016-08-14 ENCOUNTER — Other Ambulatory Visit: Payer: Self-pay | Admitting: Hematology & Oncology

## 2016-08-14 DIAGNOSIS — E559 Vitamin D deficiency, unspecified: Secondary | ICD-10-CM

## 2016-08-19 ENCOUNTER — Ambulatory Visit (INDEPENDENT_AMBULATORY_CARE_PROVIDER_SITE_OTHER): Payer: Medicare Other | Admitting: Pharmacist

## 2016-08-19 DIAGNOSIS — Z7901 Long term (current) use of anticoagulants: Secondary | ICD-10-CM

## 2016-08-19 DIAGNOSIS — I4891 Unspecified atrial fibrillation: Secondary | ICD-10-CM | POA: Diagnosis not present

## 2016-08-19 LAB — POCT INR: INR: 2.2

## 2016-08-30 ENCOUNTER — Other Ambulatory Visit: Payer: Self-pay | Admitting: Cardiovascular Disease

## 2016-09-03 ENCOUNTER — Other Ambulatory Visit: Payer: Self-pay | Admitting: Hematology & Oncology

## 2016-09-08 DIAGNOSIS — R300 Dysuria: Secondary | ICD-10-CM | POA: Diagnosis not present

## 2016-09-29 DIAGNOSIS — C50919 Malignant neoplasm of unspecified site of unspecified female breast: Secondary | ICD-10-CM | POA: Diagnosis not present

## 2016-09-29 DIAGNOSIS — N183 Chronic kidney disease, stage 3 (moderate): Secondary | ICD-10-CM | POA: Diagnosis not present

## 2016-09-29 DIAGNOSIS — C7951 Secondary malignant neoplasm of bone: Secondary | ICD-10-CM | POA: Diagnosis not present

## 2016-09-29 DIAGNOSIS — Z0000 Encounter for general adult medical examination without abnormal findings: Secondary | ICD-10-CM | POA: Diagnosis not present

## 2016-09-30 ENCOUNTER — Ambulatory Visit (INDEPENDENT_AMBULATORY_CARE_PROVIDER_SITE_OTHER): Payer: Medicare Other | Admitting: Pharmacist

## 2016-09-30 DIAGNOSIS — I4891 Unspecified atrial fibrillation: Secondary | ICD-10-CM

## 2016-09-30 DIAGNOSIS — Z7901 Long term (current) use of anticoagulants: Secondary | ICD-10-CM | POA: Diagnosis not present

## 2016-09-30 DIAGNOSIS — N390 Urinary tract infection, site not specified: Secondary | ICD-10-CM | POA: Diagnosis not present

## 2016-09-30 LAB — POCT INR: INR: 2

## 2016-10-02 ENCOUNTER — Other Ambulatory Visit: Payer: Self-pay | Admitting: Hematology & Oncology

## 2016-10-02 DIAGNOSIS — E559 Vitamin D deficiency, unspecified: Secondary | ICD-10-CM

## 2016-10-17 ENCOUNTER — Other Ambulatory Visit: Payer: Self-pay | Admitting: Cardiovascular Disease

## 2016-10-17 ENCOUNTER — Other Ambulatory Visit: Payer: Self-pay | Admitting: Hematology & Oncology

## 2016-10-19 DIAGNOSIS — N390 Urinary tract infection, site not specified: Secondary | ICD-10-CM | POA: Diagnosis not present

## 2016-10-21 ENCOUNTER — Ambulatory Visit (INDEPENDENT_AMBULATORY_CARE_PROVIDER_SITE_OTHER): Payer: Medicare Other | Admitting: *Deleted

## 2016-10-21 ENCOUNTER — Telehealth: Payer: Self-pay | Admitting: Cardiology

## 2016-10-21 DIAGNOSIS — I495 Sick sinus syndrome: Secondary | ICD-10-CM | POA: Diagnosis not present

## 2016-10-21 NOTE — Telephone Encounter (Signed)
LMOVM reminding pt to send remote transmission.   

## 2016-10-22 ENCOUNTER — Encounter: Payer: Self-pay | Admitting: Cardiology

## 2016-10-22 LAB — CUP PACEART REMOTE DEVICE CHECK
Battery Remaining Longevity: 100 mo
Battery Voltage: 3.02 V
Brady Statistic AP VP Percent: 0.11 %
Brady Statistic AP VS Percent: 85.85 %
Brady Statistic AS VP Percent: 0.02 %
Brady Statistic AS VS Percent: 14.02 %
Brady Statistic RA Percent Paced: 85.92 %
Brady Statistic RV Percent Paced: 0.13 %
Date Time Interrogation Session: 20180516214227
Implantable Lead Implant Date: 20160223
Implantable Lead Implant Date: 20160223
Implantable Lead Location: 753859
Implantable Lead Location: 753860
Implantable Lead Model: 5076
Implantable Lead Model: 5076
Implantable Pulse Generator Implant Date: 20160223
Lead Channel Impedance Value: 361 Ohm
Lead Channel Impedance Value: 418 Ohm
Lead Channel Impedance Value: 456 Ohm
Lead Channel Impedance Value: 475 Ohm
Lead Channel Pacing Threshold Amplitude: 1 V
Lead Channel Pacing Threshold Amplitude: 1.25 V
Lead Channel Pacing Threshold Pulse Width: 0.4 ms
Lead Channel Pacing Threshold Pulse Width: 0.4 ms
Lead Channel Sensing Intrinsic Amplitude: 1.625 mV
Lead Channel Sensing Intrinsic Amplitude: 1.625 mV
Lead Channel Sensing Intrinsic Amplitude: 11.375 mV
Lead Channel Sensing Intrinsic Amplitude: 11.375 mV
Lead Channel Setting Pacing Amplitude: 1.5 V
Lead Channel Setting Pacing Amplitude: 3.25 V
Lead Channel Setting Pacing Pulse Width: 0.4 ms
Lead Channel Setting Sensing Sensitivity: 0.9 mV

## 2016-10-22 NOTE — Progress Notes (Signed)
Remote pacemaker transmission.   

## 2016-11-03 ENCOUNTER — Other Ambulatory Visit: Payer: Self-pay | Admitting: Hematology & Oncology

## 2016-11-03 DIAGNOSIS — E559 Vitamin D deficiency, unspecified: Secondary | ICD-10-CM

## 2016-11-11 ENCOUNTER — Ambulatory Visit (INDEPENDENT_AMBULATORY_CARE_PROVIDER_SITE_OTHER): Payer: Medicare Other | Admitting: Pharmacist Clinician (PhC)/ Clinical Pharmacy Specialist

## 2016-11-11 DIAGNOSIS — I4891 Unspecified atrial fibrillation: Secondary | ICD-10-CM

## 2016-11-11 DIAGNOSIS — Z7901 Long term (current) use of anticoagulants: Secondary | ICD-10-CM | POA: Diagnosis not present

## 2016-11-11 LAB — POCT INR: INR: 2.3

## 2016-11-13 DIAGNOSIS — E039 Hypothyroidism, unspecified: Secondary | ICD-10-CM | POA: Diagnosis not present

## 2016-11-15 ENCOUNTER — Other Ambulatory Visit: Payer: Self-pay | Admitting: Hematology & Oncology

## 2016-11-19 DIAGNOSIS — R609 Edema, unspecified: Secondary | ICD-10-CM | POA: Diagnosis not present

## 2016-11-19 DIAGNOSIS — T148XXA Other injury of unspecified body region, initial encounter: Secondary | ICD-10-CM | POA: Diagnosis not present

## 2016-11-29 ENCOUNTER — Other Ambulatory Visit: Payer: Self-pay | Admitting: Hematology & Oncology

## 2016-11-29 DIAGNOSIS — E559 Vitamin D deficiency, unspecified: Secondary | ICD-10-CM

## 2016-12-15 DIAGNOSIS — R34 Anuria and oliguria: Secondary | ICD-10-CM | POA: Diagnosis not present

## 2016-12-15 DIAGNOSIS — R609 Edema, unspecified: Secondary | ICD-10-CM | POA: Diagnosis not present

## 2016-12-16 DIAGNOSIS — R34 Anuria and oliguria: Secondary | ICD-10-CM | POA: Diagnosis not present

## 2016-12-17 ENCOUNTER — Other Ambulatory Visit: Payer: Self-pay | Admitting: Cardiovascular Disease

## 2016-12-19 ENCOUNTER — Other Ambulatory Visit: Payer: Self-pay | Admitting: Hematology & Oncology

## 2016-12-22 ENCOUNTER — Ambulatory Visit (INDEPENDENT_AMBULATORY_CARE_PROVIDER_SITE_OTHER): Payer: Medicare Other | Admitting: Pharmacist

## 2016-12-22 ENCOUNTER — Telehealth: Payer: Self-pay | Admitting: Cardiovascular Disease

## 2016-12-22 DIAGNOSIS — Z7901 Long term (current) use of anticoagulants: Secondary | ICD-10-CM | POA: Diagnosis not present

## 2016-12-22 DIAGNOSIS — I4891 Unspecified atrial fibrillation: Secondary | ICD-10-CM

## 2016-12-22 LAB — POCT INR: INR: 3.7

## 2016-12-22 NOTE — Telephone Encounter (Signed)
Did she tell you how much she actually weighs? Please bring her in for an appointment w APP.  Meanwhile, have her try taking 80 mg furosemide once daily for a couple of days to see if that is any improvement, but also take potassium chloride 20 mEq daily. Her creatinine was as high as 1.26 last September and most recently was 0.97. I don't think the creatinine of 1.14 is a problem. You can actually get potassium levels in [facility], but have to go into labs a little differently. Trixie Dredge can tell you how to do it. Thanks, [employer]

## 2016-12-22 NOTE — Telephone Encounter (Signed)
Patient in the office for INR check .She has complaints about fluid retention & shortness of breath and pharmacist requested nurse eval.   Spoke with patient who reports gradual swelling in bilateral LE (feet/legs) since starting diltiazem. She also reports abdominal swelling. She states she gains about 5lbs every week. She weighs daily but does not record weights. She has not called in regarding her symptoms per chart review. She reports she saw her PCP twice last week. She informed PCP that she gained 4lbs in 2 days and no med were changed per patient. She states PCP told her that she should not worry about her weight gain as it would make her less susceptible to communicable diseases, such as the flu. She was ordered to have a urine test and could not provide a specimen so she was advised to drink lots of water - she didn't urinate for 10+ hours and then she couldn't sit down after because she felt so full of fluid. She reports her breathing is not good, thinks water weight is causing her shortness of breath. She also reports she had lab work at PCP office and was told her kidney function is not good. She has lasix 40mg  PRN (rx'ed by Dr. Loletha Grayer per patient - not on med list) that she has used for the last 2 days.   Per labs accessed via [facility] from PCP: on 7/10 BUN = 17, creatinine 1.14 (Na, K levels not available in [facility])  Educated patient on importance of weighing daily and advised it would be helpful for her to record her readings. Advised to avoid salt, elevated legs, used compression stockings if able. Advised patient on importance of notifying her cardiologist of weight gain, shortness of breath, and/or if she is having to use PRN lasix for periods of time.   Informed patient would have to send message to MD for review and advice and we would follow up with her accordingly.

## 2016-12-22 NOTE — Telephone Encounter (Signed)
LMTCB Scheduled patient for 12/24/16 appt with Maude Leriche, PA

## 2016-12-23 MED ORDER — POTASSIUM CHLORIDE 20 MEQ PO PACK: 20.0000 meq | PACK | Freq: Every day | ORAL | 0 refills | Status: DC

## 2016-12-23 NOTE — Telephone Encounter (Signed)
Patient returned call. Provided her with MD recommendations. She agrees with plan to increase lasix to 80mg  daily for today and tomorrow and will review further med adjustments with PA at appt on 7/19. She cannot take K+ tablets so Rx(s) sent to pharmacy electronically for 22mEq K+ packet. She does not record her weights at home.

## 2016-12-23 NOTE — Telephone Encounter (Signed)
LMTCB
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
## 2016-12-23 NOTE — Telephone Encounter (Signed)
She should weigh every day and keep a log

## 2016-12-23 NOTE — Telephone Encounter (Signed)
Patient was educated on importance of daily weights + log yesterday 7/17 Called patient to stress MD recommendations of this as well Patient voiced understanding

## 2016-12-24 ENCOUNTER — Ambulatory Visit (INDEPENDENT_AMBULATORY_CARE_PROVIDER_SITE_OTHER): Payer: Medicare Other | Admitting: Student

## 2016-12-24 ENCOUNTER — Encounter: Payer: Self-pay | Admitting: Student

## 2016-12-24 ENCOUNTER — Other Ambulatory Visit: Payer: Self-pay | Admitting: Student

## 2016-12-24 VITALS — BP 122/64 | HR 72 | Ht 66.0 in | Wt 180.0 lb

## 2016-12-24 DIAGNOSIS — Z950 Presence of cardiac pacemaker: Secondary | ICD-10-CM

## 2016-12-24 DIAGNOSIS — Z7901 Long term (current) use of anticoagulants: Secondary | ICD-10-CM

## 2016-12-24 DIAGNOSIS — R600 Localized edema: Secondary | ICD-10-CM | POA: Diagnosis not present

## 2016-12-24 DIAGNOSIS — I10 Essential (primary) hypertension: Secondary | ICD-10-CM | POA: Diagnosis not present

## 2016-12-24 DIAGNOSIS — I480 Paroxysmal atrial fibrillation: Secondary | ICD-10-CM

## 2016-12-24 MED ORDER — FUROSEMIDE 40 MG PO TABS: 40.0000 mg | ORAL_TABLET | Freq: Every day | ORAL | 0 refills | Status: DC

## 2016-12-24 NOTE — Patient Instructions (Addendum)
Medication Instructions:  Your physician has recommended you make the following change in your medication:  1.  START Lasix 40 mg taking 1 tablet by mouth for 1 WEEK ONLY (then call Amy White with your weight so we can decide what to do)   Labwork: 1 WEEK:  BMET  Testing/Procedures: None ordered  Follow-Up: Your physician recommends that you schedule a follow-up appointment in: 2 [place] OR Amy Pho, PA-C   Any Other Special Instructions Will Be Listed Below (If Applicable).     If you need a refill on your cardiac medications before your next appointment, please call your pharmacy.  Potassium Content of Foods Potassium is a mineral found in many foods and drinks. It helps keep fluids and minerals balanced in your body and affects how steadily your heart beats. Potassium also helps control your blood pressure and keep your muscles and nervous system healthy. Certain health conditions and medicines may change the balance of potassium in your body. When this happens, you can help balance your level of potassium through the foods that you do or do not eat. Your health care provider or dietitian may recommend an amount of potassium that you should have each day. The following lists of foods provide the amount of potassium (in parentheses) per serving in each item. High in potassium The following foods and beverages have 200 mg or more of potassium per serving:  Apricots, 2 raw or 5 dry (200 mg).  Artichoke, 1 medium (345 mg).  Avocado, raw,  each (245 mg).  Banana, 1 medium (425 mg).  Beans, lima, or baked beans, canned,  cup (280 mg).  Beans, white, canned,  cup (595 mg).  Beef roast, 3 oz (320 mg).  Beef, ground, 3 oz (270 mg).  Beets, raw or cooked,  cup (260 mg).  Bran muffin, 2 oz (300 mg).  Broccoli,  cup (230 mg).  Brussels sprouts,  cup (250 mg).  Cantaloupe,  cup (215 mg).  Cereal, 100% bran,  cup (200-400 mg).  Cheeseburger, single, fast  food, 1 each (225-400 mg).  Chicken, 3 oz (220 mg).  Clams, canned, 3 oz (535 mg).  Crab, 3 oz (225 mg).  Dates, 5 each (270 mg).  Dried beans and peas,  cup (300-475 mg).  Figs, dried, 2 each (260 mg).  Fish: halibut, tuna, cod, snapper, 3 oz (480 mg).  Fish: salmon, haddock, swordfish, perch, 3 oz (300 mg).  Fish, tuna, canned 3 oz (200 mg).  [place] fries, fast food, 3 oz (470 mg).  Granola with fruit and nuts,  cup (200 mg).  Grapefruit juice,  cup (200 mg).  Greens, beet,  cup (655 mg).  Honeydew melon,  cup (200 mg).  Kale, raw, 1 cup (300 mg).  Kiwi, 1 medium (240 mg).  Kohlrabi, rutabaga, parsnips,  cup (280 mg).  Lentils,  cup (365 mg).  Mango, 1 each (325 mg).  Milk, chocolate, 1 cup (420 mg).  Milk: nonfat, low-fat, whole, buttermilk, 1 cup (350-380 mg).  Molasses, 1 Tbsp (295 mg).  Mushrooms,  cup (280) mg.  Nectarine, 1 each (275 mg).  Nuts: almonds, peanuts, hazelnuts, [place], cashew, mixed, 1 oz (200 mg).  Nuts, pistachios, 1 oz (295 mg).  Orange, 1 each (240 mg).  Orange juice,  cup (235 mg).  Papaya, medium,  fruit (390 mg).  Peanut butter, chunky, 2 Tbsp (240 mg).  Peanut butter, smooth, 2 Tbsp (210 mg).  Pear, 1 medium (200 mg).  Pomegranate, 1 whole (400  mg).  Pomegranate juice,  cup (215 mg).  Pork, 3 oz (350 mg).  Potato chips, salted, 1 oz (465 mg).  Potato, baked with skin, 1 medium (925 mg).  Potatoes, boiled,  cup (255 mg).  Potatoes, mashed,  cup (330 mg).  Prune juice,  cup (370 mg).  Prunes, 5 each (305 mg).  Pudding, chocolate,  cup (230 mg).  Pumpkin, canned,  cup (250 mg).  Raisins, seedless,  cup (270 mg).  Seeds, sunflower or pumpkin, 1 oz (240 mg).  Soy milk, 1 cup (300 mg).  Spinach,  cup (420 mg).  Spinach, canned,  cup (370 mg).  Sweet potato, baked with skin, 1 medium (450 mg).  Swiss chard,  cup (480 mg).  Tomato or vegetable juice,  cup (275 mg).  Tomato  sauce or puree,  cup (400-550 mg).  Tomato, raw, 1 medium (290 mg).  Tomatoes, canned,  cup (200-300 mg).  [place], 3 oz (250 mg).  Wheat germ, 1 oz (250 mg).  Winter squash,  cup (250 mg).  Yogurt, plain or fruited, 6 oz (260-435 mg).  Zucchini,  cup (220 mg).  Moderate in potassium The following foods and beverages have 50-200 mg of potassium per serving:  Apple, 1 each (150 mg).  Apple juice,  cup (150 mg).  Applesauce,  cup (90 mg).  Apricot nectar,  cup (140 mg).  Asparagus, small spears,  cup or 6 spears (155 mg).  Bagel, cinnamon raisin, 1 each (130 mg).  Bagel, egg or plain, 4 in., 1 each (70 mg).  Beans, green,  cup (90 mg).  Beans, yellow,  cup (190 mg).  Beer, regular, 12 oz (100 mg).  Beets, canned,  cup (125 mg).  Blackberries,  cup (115 mg).  Blueberries,  cup (60 mg).  Bread, whole wheat, 1 slice (70 mg).  Broccoli, raw,  cup (145 mg).  Cabbage,  cup (150 mg).  Carrots, cooked or raw,  cup (180 mg).  Cauliflower, raw,  cup (150 mg).  Celery, raw,  cup (155 mg).  Cereal, bran flakes, cup (120-150 mg).  Cheese, cottage,  cup (110 mg).  Cherries, 10 each (150 mg).  Chocolate, 1 oz bar (165 mg).  Coffee, brewed 6 oz (90 mg).  Corn,  cup or 1 ear (195 mg).  Cucumbers,  cup (80 mg).  Egg, large, 1 each (60 mg).  Eggplant,  cup (60 mg).  Endive, raw, cup (80 mg).  English muffin, 1 each (65 mg).  Fish, orange roughy, 3 oz (150 mg).  Frankfurter, beef or pork, 1 each (75 mg).  Fruit cocktail,  cup (115 mg).  Grape juice,  cup (170 mg).  Grapefruit,  fruit (175 mg).  Grapes,  cup (155 mg).  Greens: kale, turnip, collard,  cup (110-150 mg).  Ice cream or frozen yogurt, chocolate,  cup (175 mg).  Ice cream or frozen yogurt, vanilla,  cup (120-150 mg).  Lemons, limes, 1 each (80 mg).  Lettuce, all types, 1 cup (100 mg).  Mixed vegetables,  cup (150 mg).  Mushrooms, raw,  cup (110  mg).  Nuts: walnuts, pecans, or macadamia, 1 oz (125 mg).  Oatmeal,  cup (80 mg).  Okra,  cup (110 mg).  Onions, raw,  cup (120 mg).  Peach, 1 each (185 mg).  Peaches, canned,  cup (120 mg).  Pears, canned,  cup (120 mg).  Peas, green, frozen,  cup (90 mg).  Peppers, green,  cup (130 mg).  Peppers, red,  cup (160 mg).  Pineapple juice,  cup (165 mg).  Pineapple, fresh or canned,  cup (100 mg).  Plums, 1 each (105 mg).  Pudding, vanilla,  cup (150 mg).  Raspberries,  cup (90 mg).  Rhubarb,  cup (115 mg).  Rice, wild,  cup (80 mg).  Shrimp, 3 oz (155 mg).  Spinach, raw, 1 cup (170 mg).  Strawberries,  cup (125 mg).  Summer squash  cup (175-200 mg).  Swiss chard, raw, 1 cup (135 mg).  Tangerines, 1 each (140 mg).  Tea, brewed, 6 oz (65 mg).  Turnips,  cup (140 mg).  Watermelon,  cup (85 mg).  Wine, red, table, 5 oz (180 mg).  Wine, white, table, 5 oz (100 mg).  Low in potassium The following foods and beverages have less than 50 mg of potassium per serving.  Bread, white, 1 slice (30 mg).  Carbonated beverages, 12 oz (less than 5 mg).  Cheese, 1 oz (20-30 mg).  Cranberries,  cup (45 mg).  Cranberry juice cocktail,  cup (20 mg).  Fats and oils, 1 Tbsp (less than 5 mg).  Hummus, 1 Tbsp (32 mg).  Nectar: papaya, mango, or pear,  cup (35 mg).  Rice, white or brown,  cup (50 mg).  Spaghetti or macaroni,  cup cooked (30 mg).  Tortilla, flour or corn, 1 each (50 mg).  Waffle, 4 in., 1 each (50 mg).  Water chestnuts,  cup (40 mg).  This information is not intended to replace advice given to you by your health care provider. Make sure you discuss any questions you have with your health care provider. Document Released: 01/06/2005 Document Revised: 10/31/2015 Document Reviewed: 04/21/2013 Elsevier Interactive Patient Education  [employer].

## 2016-12-31 ENCOUNTER — Other Ambulatory Visit: Payer: Self-pay | Admitting: Hematology & Oncology

## 2016-12-31 DIAGNOSIS — E559 Vitamin D deficiency, unspecified: Secondary | ICD-10-CM

## 2017-01-01 ENCOUNTER — Telehealth: Payer: Self-pay | Admitting: Student

## 2017-01-01 NOTE — Telephone Encounter (Signed)
Message sent to scheduler to have follow up appt scheduled.

## 2017-01-01 NOTE — Telephone Encounter (Signed)
I am glad to hear she is feeling better. Yes, she is suppose to have a repeat BMET today and it would be great if she would come to our office for it as that will determine if she needs K+ supplementation. With weights trending down, would stay on Lasix 40mg  daily as she reported a baseline weight of 168-169 lbs. She was suppose to have a follow-up appointment with myself or Dr. Sallyanne Kuster two weeks out from her last visit but I do not see where this is scheduled. If neither of [place] have slots, please arrange with an APP on Dr. Lurline Del team. Thank you!

## 2017-01-01 NOTE — Telephone Encounter (Signed)
Follow up has been scheduled.  Called patient, aware of recommendations and verbalized understanding. Aware of follow up.   Patient is unable to get labs today but will Monday (she doesn't have a car, it is in the shop being worked on).

## 2017-01-01 NOTE — Telephone Encounter (Signed)
Patient calling to report her current weight of 174 lbs, patient states she is doing a lot better, no SOB and only swelling in the leg she had surgery on.   Also wondering if she can come into our office to have lab work completed.  Advised of hours and location, no appt needed.   Patient aware and verbalized understanding.    Patient also wondering if she should be on potassium (but reports she cannot take K-dur).   Advised that it would depend on medication dosing/direction as well as blood work.   Patient aware.   Routed to PA for review.

## 2017-01-01 NOTE — Telephone Encounter (Signed)
New message    Pt is calling to report her weight for the week. Her weight was180 last time she was here, and today it was 174.

## 2017-01-04 ENCOUNTER — Other Ambulatory Visit: Payer: Self-pay | Admitting: Student

## 2017-01-04 ENCOUNTER — Other Ambulatory Visit: Payer: Self-pay | Admitting: *Deleted

## 2017-01-04 DIAGNOSIS — Z950 Presence of cardiac pacemaker: Secondary | ICD-10-CM | POA: Diagnosis not present

## 2017-01-04 DIAGNOSIS — I480 Paroxysmal atrial fibrillation: Secondary | ICD-10-CM | POA: Diagnosis not present

## 2017-01-05 LAB — BASIC METABOLIC PANEL
BUN/Creatinine Ratio: 13 (ref 12–28)
BUN: 14 mg/dL (ref 8–27)
CO2: 25 mmol/L (ref 20–29)
Calcium: 9.5 mg/dL (ref 8.7–10.3)
Chloride: 106 mmol/L (ref 96–106)
Creatinine, Ser: 1.12 mg/dL — ABNORMAL HIGH (ref 0.57–1.00)
GFR calc Af Amer: 50 mL/min/{1.73_m2} — ABNORMAL LOW (ref >59)
GFR calc non Af Amer: 44 mL/min/{1.73_m2} — ABNORMAL LOW (ref >59)
Glucose: 78 mg/dL (ref 65–99)
Potassium: 4.9 mmol/L (ref 3.5–5.2)
Sodium: 144 mmol/L (ref 134–144)

## 2017-01-14 ENCOUNTER — Ambulatory Visit (INDEPENDENT_AMBULATORY_CARE_PROVIDER_SITE_OTHER): Payer: Medicare Other | Admitting: Physician Assistant

## 2017-01-14 ENCOUNTER — Ambulatory Visit (INDEPENDENT_AMBULATORY_CARE_PROVIDER_SITE_OTHER): Payer: Medicare Other | Admitting: Pharmacist

## 2017-01-14 ENCOUNTER — Encounter: Payer: Self-pay | Admitting: Physician Assistant

## 2017-01-14 VITALS — BP 140/75 | HR 91 | Ht 66.0 in | Wt 181.2 lb

## 2017-01-14 DIAGNOSIS — I10 Essential (primary) hypertension: Secondary | ICD-10-CM | POA: Diagnosis not present

## 2017-01-14 DIAGNOSIS — I4891 Unspecified atrial fibrillation: Secondary | ICD-10-CM | POA: Diagnosis not present

## 2017-01-14 DIAGNOSIS — Z950 Presence of cardiac pacemaker: Secondary | ICD-10-CM | POA: Diagnosis not present

## 2017-01-14 DIAGNOSIS — E039 Hypothyroidism, unspecified: Secondary | ICD-10-CM

## 2017-01-14 DIAGNOSIS — Z7901 Long term (current) use of anticoagulants: Secondary | ICD-10-CM | POA: Diagnosis not present

## 2017-01-14 DIAGNOSIS — Z79899 Other long term (current) drug therapy: Secondary | ICD-10-CM | POA: Diagnosis not present

## 2017-01-14 DIAGNOSIS — I4819 Other persistent atrial fibrillation: Secondary | ICD-10-CM

## 2017-01-14 DIAGNOSIS — R600 Localized edema: Secondary | ICD-10-CM | POA: Diagnosis not present

## 2017-01-14 DIAGNOSIS — I481 Persistent atrial fibrillation: Secondary | ICD-10-CM | POA: Diagnosis not present

## 2017-01-14 LAB — POCT INR: INR: 3

## 2017-01-14 MED ORDER — ATENOLOL 25 MG PO TABS: 25.0000 mg | ORAL_TABLET | Freq: Every day | ORAL | 0 refills | Status: DC

## 2017-01-14 MED ORDER — DILTIAZEM HCL ER COATED BEADS 120 MG PO CP24: 120.0000 mg | ORAL_CAPSULE | Freq: Every day | ORAL | 0 refills | Status: DC

## 2017-01-14 NOTE — Patient Instructions (Signed)
Medication Instructions:   Starting tomorrow, DECREASE Diltiazem to 120mg  DAILY.   Starting tomorrow, START taking Atenolol, 25mg  DAILY.  Prescriptions have been sent to your pharmacy.   Labwork:   BMET today  Testing/Procedures:  none  Follow-Up:  3-4 weeks with Almyra Deforest PA  If you need a refill on your cardiac medications before your next appointment, please call your pharmacy.
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
## 2017-01-15 ENCOUNTER — Encounter: Payer: Self-pay | Admitting: Physician Assistant

## 2017-01-15 LAB — BASIC METABOLIC PANEL
BUN/Creatinine Ratio: 13 (ref 12–28)
BUN: 15 mg/dL (ref 8–27)
CO2: 26 mmol/L (ref 20–29)
Calcium: 9.8 mg/dL (ref 8.7–10.3)
Chloride: 104 mmol/L (ref 96–106)
Creatinine, Ser: 1.12 mg/dL — ABNORMAL HIGH (ref 0.57–1.00)
GFR calc Af Amer: 50 mL/min/{1.73_m2} — ABNORMAL LOW (ref >59)
GFR calc non Af Amer: 44 mL/min/{1.73_m2} — ABNORMAL LOW (ref >59)
Glucose: 83 mg/dL (ref 65–99)
Potassium: 4.5 mmol/L (ref 3.5–5.2)
Sodium: 144 mmol/L (ref 134–144)

## 2017-01-15 NOTE — Progress Notes (Signed)
Clarification: it would be 60mg  daily after 2 days

## 2017-01-18 ENCOUNTER — Other Ambulatory Visit: Payer: Self-pay | Admitting: Hematology & Oncology

## 2017-01-20 ENCOUNTER — Encounter: Payer: Medicare Other | Admitting: *Deleted

## 2017-01-22 ENCOUNTER — Telehealth: Payer: Self-pay | Admitting: Physician Assistant

## 2017-01-22 NOTE — Telephone Encounter (Signed)
Pt called stating she developed a rash tonight after taking atenolol for 8 days. I advised that she could stop taking the atenolol to see if the rash got better but she did not want to abruptly stop the bb. I advised her that she could take a half of the atenolol pill until Monday and then call the office for an appt. I also asked her to call back if the rash worsened tomorrow.  Tami Lin Clea Dubach, PA-C 01/22/2017, 7:56 PM [phone]

## 2017-01-25 ENCOUNTER — Other Ambulatory Visit (HOSPITAL_BASED_OUTPATIENT_CLINIC_OR_DEPARTMENT_OTHER): Payer: Medicare Other

## 2017-01-25 ENCOUNTER — Ambulatory Visit (HOSPITAL_BASED_OUTPATIENT_CLINIC_OR_DEPARTMENT_OTHER): Payer: Medicare Other

## 2017-01-25 ENCOUNTER — Ambulatory Visit (HOSPITAL_BASED_OUTPATIENT_CLINIC_OR_DEPARTMENT_OTHER): Payer: Medicare Other | Admitting: Hematology & Oncology

## 2017-01-25 VITALS — BP 118/59 | HR 83 | Temp 98.3°F | Resp 19 | Wt 182.4 lb

## 2017-01-25 DIAGNOSIS — C50919 Malignant neoplasm of unspecified site of unspecified female breast: Secondary | ICD-10-CM | POA: Diagnosis not present

## 2017-01-25 DIAGNOSIS — C7951 Secondary malignant neoplasm of bone: Secondary | ICD-10-CM

## 2017-01-25 DIAGNOSIS — C50911 Malignant neoplasm of unspecified site of right female breast: Secondary | ICD-10-CM

## 2017-01-25 DIAGNOSIS — C50912 Malignant neoplasm of unspecified site of left female breast: Secondary | ICD-10-CM

## 2017-01-25 LAB — CMP (CANCER CENTER ONLY)
ALT(SGPT): 20 U/L (ref 10–47)
AST: 25 U/L (ref 11–38)
Albumin: 3.7 g/dL (ref 3.3–5.5)
Alkaline Phosphatase: 64 U/L (ref 26–84)
BUN, Bld: 18 mg/dL (ref 7–22)
CO2: 29 mEq/L (ref 18–33)
Calcium: 9.3 mg/dL (ref 8.0–10.3)
Chloride: 108 mEq/L (ref 98–108)
Creat: 1.2 mg/dl (ref 0.6–1.2)
Glucose, Bld: 120 mg/dL — ABNORMAL HIGH (ref 73–118)
Potassium: 4 mEq/L (ref 3.3–4.7)
Sodium: 146 mEq/L — ABNORMAL HIGH (ref 128–145)
Total Bilirubin: 1.2 mg/dl (ref 0.20–1.60)
Total Protein: 6.7 g/dL (ref 6.4–8.1)

## 2017-01-25 LAB — CBC WITH DIFFERENTIAL (CANCER CENTER ONLY)
BASO#: 0 10*3/uL (ref 0.0–0.2)
BASO%: 0.8 % (ref 0.0–2.0)
EOS%: 3.3 % (ref 0.0–7.0)
Eosinophils Absolute: 0.1 10*3/uL (ref 0.0–0.5)
HCT: 37.1 % (ref 34.8–46.6)
HGB: 12.2 g/dL (ref 11.6–15.9)
LYMPH#: 1.2 10*3/uL (ref 0.9–3.3)
LYMPH%: 29.8 % (ref 14.0–48.0)
MCH: 29.5 pg (ref 26.0–34.0)
MCHC: 32.9 g/dL (ref 32.0–36.0)
MCV: 90 fL (ref 81–101)
MONO#: 0.4 10*3/uL (ref 0.1–0.9)
MONO%: 10.3 % (ref 0.0–13.0)
NEUT#: 2.2 10*3/uL (ref 1.5–6.5)
NEUT%: 55.8 % (ref 39.6–80.0)
Platelets: 150 10*3/uL (ref 145–400)
RBC: 4.14 10*6/uL (ref 3.70–5.32)
RDW: 13.5 % (ref 11.1–15.7)
WBC: 3.9 10*3/uL (ref 3.9–10.0)

## 2017-01-25 MED ORDER — DENOSUMAB 120 MG/1.7ML ~~LOC~~ SOLN
120.0000 mg | Freq: Once | SUBCUTANEOUS | Status: AC
  Administered 2017-01-25: 120 mg via SUBCUTANEOUS

## 2017-01-25 MED ORDER — DENOSUMAB 120 MG/1.7ML ~~LOC~~ SOLN
SUBCUTANEOUS | Status: AC
  Filled 2017-01-25: qty 1.7

## 2017-01-25 NOTE — Progress Notes (Signed)
Hematology and Oncology Follow Up Visit  TAREA SKILLMAN [id number] [birth year] 83 y.o. 01/25/2017   Principle Diagnosis:  Metastatic breast cancer, bone metastasis only.  Current Therapy:   1. Femara 2.5 mg p.o. daily. 2. Xgeva 120mg  sq q 6 month     Interim History:  Ms.  Amy White is for followup. She is looking quite good. We saw her back in February . We did go ahead and get a bone scan on her. The bone scan was done in late January. Everything looked stable. She had uptake in the T12 vertebral body, right iliac crest and right anterior rib. This all has been noted before.  She is not complaining of any pain. She does have bad arthritis second cause her some problems.  Her last CA 27.29 has been trending upward. We last saw her, the CA 27.29 was up to 53.  She's been on Femara now for probably 13 years. She has done a great job with Femara. If we find that the CA-27-29 keeps trending up, then we may put her on one of the new CDK4/CDK6 inhibitors.  She's had no fever. She's had no cough or shortness of breath. She's had no change in bowel or bladder habits.  There's been no rashes. She's had no leg swelling. She's had no cough. She does have some serious arthritis.  She does have atrial fibrillation. She is on Coumadin for this.  She has had a relatively uneventful summer. She has not traveled anywhere.  Overall, her performance status is [name]   Medications:  Current Outpatient Prescriptions:  .  albuterol (PROVENTIL HFA;VENTOLIN HFA) 108 (90 BASE) MCG/ACT inhaler, Inhale 2 puffs into the lungs every 6 (six) hours as needed for wheezing or shortness of breath., Disp: , Rfl:  .  ALPRAZolam (XANAX) 0.5 MG tablet, Take 0.25 mg by mouth 2 (two) times daily as needed for anxiety. , Disp: , Rfl: 0 .  atenolol (TENORMIN) 25 MG tablet, Take 1 tablet (25 mg total) by mouth daily., Disp: 30 tablet, Rfl: 0 .  diltiazem (CARDIZEM CD) 240 MG 24 hr capsule, Take 1 capsule (240 mg total) by  mouth daily., Disp: , Rfl: 6 .  furosemide (LASIX) 40 MG tablet, Take 1 tablet (40 mg total) by mouth daily., Disp: 30 tablet, Rfl: 0 .  letrozole (FEMARA) 2.5 MG tablet, TAKE ONE TABLET BY MOUTH ONCE DAILY, Disp: 30 tablet, Rfl: 0 .  levothyroxine (SYNTHROID, LEVOTHROID) 75 MCG tablet, Take 75 mg by mouth Daily., Disp: , Rfl:  .  tetrahydrozoline-zinc (VISINE-AC) 0.05-0.25 % ophthalmic solution, Place 2 drops into both eyes 3 (three) times daily as needed (red eyes)., Disp: , Rfl:  .  Vitamin D, Ergocalciferol, (DRISDOL) 50000 units CAPS capsule, TAKE 1 CAPSULE BY MOUTH ONCE A WEEK, Disp: 4 capsule, Rfl: 0 .  warfarin (COUMADIN) 5 MG tablet, TAKE ONE-HALF TO 1 TABLET BY MOUTH DAILY AS DIRECTED BY COUMADIN CLINIC, Disp: 90 tablet, Rfl: 0  Allergies:  Allergies  Allergen Reactions  . Crestor [Rosuvastatin]   . Ciprofloxacin Rash    Rash in the mouth  . Penicillins Hives and Rash  . Vancomycin Rash    "Red-man"    Past Medical History, Surgical history, Social history, and Family History were reviewed and updated.  Review of Systems: As above  Physical Exam:  weight is 182 lb 6.4 oz (82.7 kg). Her oral temperature is 98.3 F (36.8 C). Her blood pressure is 118/59 (abnormal) and her pulse is 83. Her respiration  is 19 and oxygen saturation is 94%.   Elderly, thin but well-nourished white female. Head and neck exam shows no ocular or oral lesions. There are no palpable cervical or supraclavicular lymph nodes. Lungs are clear. Cardiac exam regular rate and rhythm with a normal S1 and S2. There are no murmurs, rubs or bruits.. Abdomen is soft. She has good bowels. There is no fluid wave. There is no palpable liver or spleen tip. She has the healing right inguinal surgery scar. There is no erythema associated with this.. Back exam shows some slight kyphosis. No tenderness noted over the spine. She has slight tenderness over the right anterior pelvic region. Extremities shows significant  osteoarthritic changes in her joints. She has some age related muscle atrophy. Neurological exam is nonfocal. Lymph node exam shows no adenopathy.   Lab Results  Component Value Date   WBC 3.9 01/25/2017   HGB 12.2 01/25/2017   HCT 37.1 01/25/2017   MCV 90 01/25/2017   PLT 150 01/25/2017     Chemistry      Component Value Date/Time   NA 144 01/14/2017 1620   NA 141 06/22/2016 1255   NA 142 07/31/2015 1203   K 4.5 01/14/2017 1620   K 3.9 07/27/2016 1449   K 4.3 06/22/2016 1255   K 5.1 07/31/2015 1203   CL 104 01/14/2017 1620   CL 104 07/27/2016 1449   CL 107 06/22/2016 1255   CO2 26 01/14/2017 1620   CO2 25 07/27/2016 1449   CO2 28 06/22/2016 1255   CO2 23 07/31/2015 1203   BUN 15 01/14/2017 1620   BUN 12 06/22/2016 1255   BUN 23.3 07/31/2015 1203   CREATININE 1.12 (H) 01/14/2017 1620   CREATININE 0.97 07/27/2016 1449   CREATININE 0.9 06/22/2016 1255   CREATININE 1.1 07/31/2015 1203      Component Value Date/Time   CALCIUM 9.8 01/14/2017 1620   CALCIUM 9.5 07/27/2016 1449   CALCIUM 9.0 06/22/2016 1255   CALCIUM 9.3 07/31/2015 1203   ALKPHOS 64 07/27/2016 1449   ALKPHOS 65 06/22/2016 1255   ALKPHOS 57 07/31/2015 1203   AST 19 07/27/2016 1449   AST 21 06/22/2016 1255   AST 18 07/31/2015 1203   ALT 12 07/27/2016 1449   ALT 18 06/22/2016 1255   ALT 15 07/31/2015 1203   BILITOT 0.6 07/27/2016 1449   BILITOT 0.90 06/22/2016 1255   BILITOT 0.96 07/31/2015 1203         Impression and Plan: [name] is a 83 year old white female with metastatic breast cancer. Her disease is confined to her bones. She has been stable. She has been stable now for about 12 years. She's done incredibly well.   We will move her Xgeva appointment out to every 6 months now. I think this would be reasonable. It will certainly help her out.  I will plan to get her back in 6 months.  If her CA 27.29 is up further, then we will definitely get her back sooner. We may have to consider  some additional scans to see what might be going on.  She has her 89th birthday, and up in March area and she really wants to make it to the 89th birthday. I really would like to see her make it to 83 years old.  I spent  about 30 minutes with her. It was nice to see her again. As always, we talked about family issues.  Volanda Napoleon, MD 8/20/20183:23 PM

## 2017-01-25 NOTE — Patient Instructions (Signed)

## 2017-01-26 LAB — CANCER ANTIGEN 27.29: CA 27.29: 39.9 U/mL — ABNORMAL HIGH (ref 0.0–38.6)

## 2017-01-27 ENCOUNTER — Encounter: Payer: Self-pay | Admitting: Cardiology

## 2017-01-31 ENCOUNTER — Other Ambulatory Visit: Payer: Self-pay | Admitting: Hematology & Oncology

## 2017-01-31 DIAGNOSIS — E559 Vitamin D deficiency, unspecified: Secondary | ICD-10-CM

## 2017-02-09 ENCOUNTER — Encounter: Payer: Self-pay | Admitting: Physician Assistant

## 2017-02-09 ENCOUNTER — Ambulatory Visit (INDEPENDENT_AMBULATORY_CARE_PROVIDER_SITE_OTHER): Payer: Medicare Other | Admitting: Physician Assistant

## 2017-02-09 VITALS — BP 114/56 | HR 85 | Ht 66.0 in | Wt 180.0 lb

## 2017-02-09 DIAGNOSIS — Z950 Presence of cardiac pacemaker: Secondary | ICD-10-CM

## 2017-02-09 DIAGNOSIS — I4819 Other persistent atrial fibrillation: Secondary | ICD-10-CM

## 2017-02-09 DIAGNOSIS — I481 Persistent atrial fibrillation: Secondary | ICD-10-CM | POA: Diagnosis not present

## 2017-02-09 DIAGNOSIS — I10 Essential (primary) hypertension: Secondary | ICD-10-CM

## 2017-02-09 DIAGNOSIS — E039 Hypothyroidism, unspecified: Secondary | ICD-10-CM | POA: Diagnosis not present

## 2017-02-09 DIAGNOSIS — R600 Localized edema: Secondary | ICD-10-CM | POA: Diagnosis not present

## 2017-02-09 MED ORDER — ATENOLOL 50 MG PO TABS
50.0000 mg | ORAL_TABLET | Freq: Every day | ORAL | 3 refills | Status: DC
Start: 2017-02-09 — End: 2017-04-23
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
## 2017-02-09 NOTE — Patient Instructions (Signed)
Medication Instructions:   STOP DILTIAZEM  INCREASE ATENOLOL TO 50 MG ONCE DAILY= 2 OF THE 25 MG TABLETS ONCE DAILY  Follow-Up:  Your physician recommends that you schedule a follow-up appointment in: [place]   If you need a refill on your cardiac medications before your next appointment, please call your pharmacy.

## 2017-02-11 ENCOUNTER — Other Ambulatory Visit: Payer: Self-pay | Admitting: Physician Assistant

## 2017-02-11 ENCOUNTER — Encounter: Payer: Self-pay | Admitting: Physician Assistant

## 2017-02-11 NOTE — Telephone Encounter (Signed)
Please review for refill, thanks ! 

## 2017-02-15 ENCOUNTER — Other Ambulatory Visit: Payer: Self-pay | Admitting: Hematology & Oncology

## 2017-02-17 ENCOUNTER — Telehealth: Payer: Self-pay | Admitting: Cardiovascular Disease

## 2017-02-17 NOTE — Telephone Encounter (Signed)
Returned call to patient of Dr. Sallyanne Kuster regarding atenolol (started on 8/9 by PA)  She saw [facility]. [place], [place] 9/4 and atenolol was increased and diltiazem was d/c'ed - med list has been updated. She states she has had "strange" headaches and "labor pains" on left side of head since starting atenolol/changing her medications. HR runs 88-90. BP this AM 125/82. She states she has had NO swelling since stopping diltiazem. She also states she feels "listless" and has fatigue - explained that this can be a side effect of b-blocker.   Advised to continue meds as directed and will notify [place], [place] of her concerns.

## 2017-02-17 NOTE — Telephone Encounter (Signed)
Patient called w/PA advice. Advised to call back in a week if symptoms do not improve

## 2017-02-17 NOTE — Telephone Encounter (Signed)
Amy White is wanting to speak with you about medication (Atenolol 50mg  ) .  States she has been in AFIB ever since she has been taking it . Please call

## 2017-02-17 NOTE — Telephone Encounter (Signed)
Try the current medication for 1 more week, if symptom persist, we may have to go back to 25mg  daily atenolol and 120mg  daily diltiazem CD.

## 2017-02-25 ENCOUNTER — Ambulatory Visit (INDEPENDENT_AMBULATORY_CARE_PROVIDER_SITE_OTHER): Payer: Medicare Other | Admitting: Pharmacist Clinician (PhC)/ Clinical Pharmacy Specialist

## 2017-02-25 DIAGNOSIS — I4891 Unspecified atrial fibrillation: Secondary | ICD-10-CM | POA: Diagnosis not present

## 2017-02-25 DIAGNOSIS — I481 Persistent atrial fibrillation: Secondary | ICD-10-CM | POA: Diagnosis not present

## 2017-02-25 DIAGNOSIS — I4819 Other persistent atrial fibrillation: Secondary | ICD-10-CM

## 2017-02-25 DIAGNOSIS — Z7901 Long term (current) use of anticoagulants: Secondary | ICD-10-CM

## 2017-02-25 LAB — POCT INR: INR: 3.5

## 2017-03-02 ENCOUNTER — Other Ambulatory Visit: Payer: Self-pay | Admitting: Hematology & Oncology

## 2017-03-02 DIAGNOSIS — E559 Vitamin D deficiency, unspecified: Secondary | ICD-10-CM

## 2017-03-10 ENCOUNTER — Ambulatory Visit (INDEPENDENT_AMBULATORY_CARE_PROVIDER_SITE_OTHER): Payer: Medicare Other | Admitting: Pharmacist

## 2017-03-10 DIAGNOSIS — I481 Persistent atrial fibrillation: Secondary | ICD-10-CM | POA: Diagnosis not present

## 2017-03-10 DIAGNOSIS — I4819 Other persistent atrial fibrillation: Secondary | ICD-10-CM

## 2017-03-10 DIAGNOSIS — Z7901 Long term (current) use of anticoagulants: Secondary | ICD-10-CM

## 2017-03-10 DIAGNOSIS — I4891 Unspecified atrial fibrillation: Secondary | ICD-10-CM | POA: Diagnosis not present

## 2017-03-10 LAB — POCT INR: INR: 3.1

## 2017-03-15 ENCOUNTER — Other Ambulatory Visit: Payer: Self-pay | Admitting: Hematology & Oncology

## 2017-03-28 ENCOUNTER — Other Ambulatory Visit: Payer: Self-pay | Admitting: Hematology & Oncology

## 2017-03-28 DIAGNOSIS — E559 Vitamin D deficiency, unspecified: Secondary | ICD-10-CM

## 2017-03-31 ENCOUNTER — Ambulatory Visit (INDEPENDENT_AMBULATORY_CARE_PROVIDER_SITE_OTHER): Payer: Medicare Other | Admitting: Pharmacist Clinician (PhC)/ Clinical Pharmacy Specialist

## 2017-03-31 DIAGNOSIS — Z7901 Long term (current) use of anticoagulants: Secondary | ICD-10-CM | POA: Diagnosis not present

## 2017-03-31 DIAGNOSIS — I481 Persistent atrial fibrillation: Secondary | ICD-10-CM | POA: Diagnosis not present

## 2017-03-31 DIAGNOSIS — I4819 Other persistent atrial fibrillation: Secondary | ICD-10-CM

## 2017-03-31 DIAGNOSIS — C7951 Secondary malignant neoplasm of bone: Secondary | ICD-10-CM | POA: Diagnosis not present

## 2017-03-31 DIAGNOSIS — C50919 Malignant neoplasm of unspecified site of unspecified female breast: Secondary | ICD-10-CM | POA: Diagnosis not present

## 2017-03-31 DIAGNOSIS — E039 Hypothyroidism, unspecified: Secondary | ICD-10-CM | POA: Diagnosis not present

## 2017-03-31 DIAGNOSIS — I4891 Unspecified atrial fibrillation: Secondary | ICD-10-CM

## 2017-03-31 LAB — POCT INR: INR: 2.3

## 2017-04-05 ENCOUNTER — Other Ambulatory Visit: Payer: Self-pay | Admitting: Cardiovascular Disease

## 2017-04-09 DIAGNOSIS — R945 Abnormal results of liver function studies: Secondary | ICD-10-CM | POA: Diagnosis not present

## 2017-04-13 ENCOUNTER — Ambulatory Visit (INDEPENDENT_AMBULATORY_CARE_PROVIDER_SITE_OTHER): Payer: Medicare Other | Admitting: *Deleted

## 2017-04-13 ENCOUNTER — Telehealth: Payer: Self-pay | Admitting: Cardiovascular Disease

## 2017-04-13 DIAGNOSIS — I495 Sick sinus syndrome: Secondary | ICD-10-CM | POA: Diagnosis not present

## 2017-04-13 NOTE — Telephone Encounter (Signed)
New message     Pt c/o BP issue: STAT if pt c/o blurred vision, one-sided weakness or slurred speech  1. What are your last 5 BP readings?  158/102 110hr  153/101 98 hr  2. Are you having any other symptoms (ex. Dizziness, headache, blurred vision, passed out)? Very nauseated   3. What is your BP issue? Got up this morning (5am) she has had to use albuterol inhaler a couple times (feeling wheezy)

## 2017-04-13 NOTE — Telephone Encounter (Signed)
Pt states that she "is in AFIB again"

## 2017-04-13 NOTE — Telephone Encounter (Signed)
~  11am  158/102 110hr  ~40min later 153/101 98 hr then 143/100 HR 98 and now right before call 140/88 HR 83. Pt states that she is taking atenolol PRN per PCP(Dr Serita Grammes) ~ 2 weeks ago. [t states that she is "taking al of her other" medications. Pt c/o nausea "like butterflies" and right sided neck pain(she thinks that this happened when working in the garden yesterday. Denies dizziness chest pain or pressure, headache or syncope.  Please advise

## 2017-04-13 NOTE — Telephone Encounter (Signed)
She is indeed in atrial fibrillation, but has been in it for a couple of months. Hard to blame the current new symptoms on afib alone. Please take the atenolol daily. Please schedule an office visit first available with APP. MCr

## 2017-04-13 NOTE — Telephone Encounter (Signed)
Pt notified she will do download right now and re-start atenolol at same dosing. She states that she has been taking coumadin as directed by Coumadin clinic

## 2017-04-13 NOTE — Telephone Encounter (Signed)
Remote transmission reviewed. Presenting rhythm: AF/Vs/Vp. Persistent since ~01-2017. Controlled V rates. 35.7%Vp. Stable device function.   Will forward information to Veronda Prude, LPN and Dr.Croitoru.

## 2017-04-13 NOTE — Telephone Encounter (Signed)
Yes, I think so MCr

## 2017-04-13 NOTE — Telephone Encounter (Signed)
Pt notified and she will re-start atenolol at same dosing, she already has appt scheduled 04-23-17 with Dr C is this appt soon enough?

## 2017-04-13 NOTE — Telephone Encounter (Signed)
I would encourage her to take the atenolol daily. It will help both the rhythm and the BP. Please ask her to do a download on her pacemaker and alert the device clinic to review. Confirm she is taking her anticoagulant and ask if it has been interrupted for any reason in the last 4 weeks. MCr

## 2017-04-14 NOTE — Progress Notes (Signed)
Remote pacemaker transmission.   

## 2017-04-14 NOTE — Telephone Encounter (Signed)
NOTED

## 2017-04-15 ENCOUNTER — Encounter: Payer: Self-pay | Admitting: Cardiology

## 2017-04-19 ENCOUNTER — Other Ambulatory Visit: Payer: Self-pay | Admitting: Hematology & Oncology

## 2017-04-19 LAB — CUP PACEART REMOTE DEVICE CHECK
Battery Remaining Longevity: 77 mo
Battery Voltage: 3.01 V
Brady Statistic AP VP Percent: 0.89 %
Brady Statistic AP VS Percent: 0.17 %
Brady Statistic AS VP Percent: 34.85 %
Brady Statistic AS VS Percent: 64.1 %
Brady Statistic RA Percent Paced: 0.8 %
Brady Statistic RV Percent Paced: 34.72 %
Date Time Interrogation Session: 20181106191759
Implantable Lead Implant Date: 20160223
Implantable Lead Implant Date: 20160223
Implantable Lead Location: 753859
Implantable Lead Location: 753860
Implantable Lead Model: 5076
Implantable Lead Model: 5076
Implantable Pulse Generator Implant Date: 20160223
Lead Channel Impedance Value: 323 Ohm
Lead Channel Impedance Value: 399 Ohm
Lead Channel Impedance Value: 456 Ohm
Lead Channel Impedance Value: 456 Ohm
Lead Channel Pacing Threshold Amplitude: 0.75 V
Lead Channel Pacing Threshold Amplitude: 1.125 V
Lead Channel Pacing Threshold Pulse Width: 0.4 ms
Lead Channel Pacing Threshold Pulse Width: 0.4 ms
Lead Channel Sensing Intrinsic Amplitude: 1 mV
Lead Channel Sensing Intrinsic Amplitude: 1 mV
Lead Channel Sensing Intrinsic Amplitude: 10.375 mV
Lead Channel Sensing Intrinsic Amplitude: 10.375 mV
Lead Channel Setting Pacing Amplitude: 1.5 V
Lead Channel Setting Pacing Amplitude: 2.75 V
Lead Channel Setting Pacing Pulse Width: 0.4 ms
Lead Channel Setting Sensing Sensitivity: 0.9 mV

## 2017-04-23 ENCOUNTER — Encounter: Payer: Self-pay | Admitting: Cardiovascular Disease

## 2017-04-23 ENCOUNTER — Ambulatory Visit: Payer: Medicare Other | Admitting: Cardiovascular Disease

## 2017-04-23 ENCOUNTER — Ambulatory Visit (INDEPENDENT_AMBULATORY_CARE_PROVIDER_SITE_OTHER): Payer: Medicare Other | Admitting: Pharmacist

## 2017-04-23 VITALS — BP 132/80 | HR 76 | Ht 65.0 in | Wt 171.2 lb

## 2017-04-23 DIAGNOSIS — I481 Persistent atrial fibrillation: Secondary | ICD-10-CM | POA: Diagnosis not present

## 2017-04-23 DIAGNOSIS — I4891 Unspecified atrial fibrillation: Secondary | ICD-10-CM | POA: Diagnosis not present

## 2017-04-23 DIAGNOSIS — Z950 Presence of cardiac pacemaker: Secondary | ICD-10-CM

## 2017-04-23 DIAGNOSIS — I4819 Other persistent atrial fibrillation: Secondary | ICD-10-CM

## 2017-04-23 DIAGNOSIS — Z7901 Long term (current) use of anticoagulants: Secondary | ICD-10-CM | POA: Diagnosis not present

## 2017-04-23 LAB — POCT INR: INR: 2

## 2017-04-23 MED ORDER — BISOPROLOL FUMARATE 5 MG PO TABS: 10.0000 mg | ORAL_TABLET | Freq: Every day | ORAL | 3 refills | Status: DC
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
## 2017-04-23 NOTE — Patient Instructions (Addendum)
Dr Sallyanne Kuster has recommended making the following medication changes: 1. STOP Atenolol 2. START Bisoprolol 5 mg - take 10 mg (2 tablets total) by mouth daily  Your physician recommends that you schedule a follow-up appointment in early JANUARY 2019 with a PA/NP - Rhonda Barrett, PA-C.  Dr Vonna Kotyk recommends that you schedule a follow-up appointment in February 2019 per recall.  If you need a refill on your cardiac medications before your next appointment, please call your pharmacy.

## 2017-04-26 ENCOUNTER — Other Ambulatory Visit: Payer: Self-pay | Admitting: Student

## 2017-05-03 ENCOUNTER — Other Ambulatory Visit: Payer: Self-pay | Admitting: Family

## 2017-05-03 DIAGNOSIS — E559 Vitamin D deficiency, unspecified: Secondary | ICD-10-CM

## 2017-05-24 ENCOUNTER — Other Ambulatory Visit: Payer: Self-pay | Admitting: Hematology & Oncology

## 2017-06-03 ENCOUNTER — Other Ambulatory Visit: Payer: Self-pay | Admitting: Student

## 2017-06-03 ENCOUNTER — Encounter: Payer: Self-pay | Admitting: *Deleted

## 2017-06-03 ENCOUNTER — Other Ambulatory Visit: Payer: Self-pay | Admitting: Family

## 2017-06-03 ENCOUNTER — Ambulatory Visit (INDEPENDENT_AMBULATORY_CARE_PROVIDER_SITE_OTHER): Payer: Medicare Other | Admitting: Pharmacist Clinician (PhC)/ Clinical Pharmacy Specialist

## 2017-06-03 DIAGNOSIS — I4891 Unspecified atrial fibrillation: Secondary | ICD-10-CM | POA: Diagnosis not present

## 2017-06-03 DIAGNOSIS — I481 Persistent atrial fibrillation: Secondary | ICD-10-CM

## 2017-06-03 DIAGNOSIS — Z7901 Long term (current) use of anticoagulants: Secondary | ICD-10-CM | POA: Diagnosis not present

## 2017-06-03 DIAGNOSIS — E559 Vitamin D deficiency, unspecified: Secondary | ICD-10-CM

## 2017-06-03 DIAGNOSIS — I4819 Other persistent atrial fibrillation: Secondary | ICD-10-CM

## 2017-06-03 LAB — POCT INR: INR: 3

## 2017-06-03 NOTE — Patient Instructions (Signed)
Description   Continue with 1/2 tablet daily except 1 tablet each Sunday, Tuesday and Thursday.  Repeat INR in 6 weeks

## 2017-06-08 ENCOUNTER — Other Ambulatory Visit: Payer: Self-pay | Admitting: Hematology & Oncology

## 2017-06-11 ENCOUNTER — Encounter: Payer: Self-pay | Admitting: Physician Assistant

## 2017-06-11 ENCOUNTER — Ambulatory Visit: Payer: Medicare Other | Admitting: Physician Assistant

## 2017-06-11 VITALS — BP 132/68 | HR 70 | Ht 67.0 in | Wt 172.2 lb

## 2017-06-11 DIAGNOSIS — Z7901 Long term (current) use of anticoagulants: Secondary | ICD-10-CM | POA: Diagnosis not present

## 2017-06-11 DIAGNOSIS — I4819 Other persistent atrial fibrillation: Secondary | ICD-10-CM

## 2017-06-11 DIAGNOSIS — I10 Essential (primary) hypertension: Secondary | ICD-10-CM | POA: Diagnosis not present

## 2017-06-11 DIAGNOSIS — Z79899 Other long term (current) drug therapy: Secondary | ICD-10-CM | POA: Diagnosis not present

## 2017-06-11 DIAGNOSIS — I481 Persistent atrial fibrillation: Secondary | ICD-10-CM | POA: Diagnosis not present
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
## 2017-06-11 NOTE — Patient Instructions (Addendum)
MEDICATION INSTRUCTION   NO CHANGES AT PRESENT    LABS  BMP -TODAY      Your Physician  Assistant recommends that you KEEP scheduled  follow-up appointment in [place]

## 2017-06-12 LAB — BASIC METABOLIC PANEL
BUN/Creatinine Ratio: 13 (ref 12–28)
BUN: 13 mg/dL (ref 8–27)
CO2: 23 mmol/L (ref 20–29)
Calcium: 9.2 mg/dL (ref 8.7–10.3)
Chloride: 107 mmol/L — ABNORMAL HIGH (ref 96–106)
Creatinine, Ser: 1.01 mg/dL — ABNORMAL HIGH (ref 0.57–1.00)
GFR calc Af Amer: 57 mL/min/{1.73_m2} — ABNORMAL LOW (ref >59)
GFR calc non Af Amer: 49 mL/min/{1.73_m2} — ABNORMAL LOW (ref >59)
Glucose: 80 mg/dL (ref 65–99)
Potassium: 4.3 mmol/L (ref 3.5–5.2)
Sodium: 146 mmol/L — ABNORMAL HIGH (ref 134–144)

## 2017-06-14 NOTE — Progress Notes (Signed)
Do not think I have ever fractured anybody's ribs with a cardioversion.  But it's her call MCr

## 2017-06-18 ENCOUNTER — Telehealth: Payer: Self-pay | Admitting: Hematology & Oncology

## 2017-06-18 ENCOUNTER — Encounter: Payer: Self-pay | Admitting: Hematology & Oncology

## 2017-06-18 NOTE — Telephone Encounter (Signed)
MEDICAL RECORDS REQUEST  RECORDS FAXED TO:  [facility]  [address]  [place] [address] [place] [address] [phone] FAX [phone]

## 2017-06-21 ENCOUNTER — Telehealth: Payer: Self-pay | Admitting: Cardiovascular Disease

## 2017-06-21 MED ORDER — BISOPROLOL FUMARATE 5 MG PO TABS: 10.0000 mg | ORAL_TABLET | Freq: Every day | ORAL | 5 refills | Status: DC

## 2017-06-21 NOTE — Telephone Encounter (Signed)
New message    Patient calling , states medication on back order until February. Patient wants to know if she needs to take another medication.  Please call  Pt c/o medication issue:  1. Name of Medication: bisoprolol (ZEBETA) 5 MG tablet  2. How are you currently taking this medication (dosage and times per day)? As prescribed  3. Are you having a reaction (difficulty breathing--STAT)? No  4. What is your medication issue? Medication on back order.    Patient c/o Palpitations:  High priority if patient c/o lightheadedness, shortness of breath, or chest pain  1) How long have you had palpitations/irregular HR/ Afib? Are you having the symptoms now?  4 months in afib per patient  2) Are you currently experiencing lightheadedness, SOB or CP?  SOB  3) Do you have a history of afib (atrial fibrillation) or irregular heart rhythm? YES  4) Have you checked your BP or HR? (document readings if available): N/A  5) Are you experiencing any other symptoms? Unable to sleep, jittery

## 2017-06-21 NOTE — Telephone Encounter (Signed)
Understood, thank you Mcr

## 2017-06-21 NOTE — Telephone Encounter (Signed)
Spoke with [facility] and they have the Bisoprolol in stock, sent via Epic  Patient c/o palpitations and heart being out of rhythm for 4 months. Per patient cardioversion has been recommended but she is not sure if she wants to have that done. Patient wants Dr Sallyanne Kuster to know sometimes palpitations keep her up at night and get his recommendations. Patient does not want an appointment.   Will forward to Dr Sallyanne Kuster for review

## 2017-07-02 ENCOUNTER — Other Ambulatory Visit: Payer: Self-pay | Admitting: Hematology & Oncology

## 2017-07-02 ENCOUNTER — Other Ambulatory Visit: Payer: Self-pay | Admitting: Cardiovascular Disease

## 2017-07-02 DIAGNOSIS — E559 Vitamin D deficiency, unspecified: Secondary | ICD-10-CM

## 2017-07-13 ENCOUNTER — Ambulatory Visit (INDEPENDENT_AMBULATORY_CARE_PROVIDER_SITE_OTHER): Payer: Medicare Other | Admitting: *Deleted

## 2017-07-13 DIAGNOSIS — I495 Sick sinus syndrome: Secondary | ICD-10-CM | POA: Diagnosis not present

## 2017-07-13 NOTE — Progress Notes (Signed)
Remote pacemaker transmission.   

## 2017-07-14 ENCOUNTER — Encounter: Payer: Self-pay | Admitting: Cardiology

## 2017-07-15 ENCOUNTER — Ambulatory Visit (INDEPENDENT_AMBULATORY_CARE_PROVIDER_SITE_OTHER): Payer: Medicare Other | Admitting: Pharmacist Clinician (PhC)/ Clinical Pharmacy Specialist

## 2017-07-15 DIAGNOSIS — Z7901 Long term (current) use of anticoagulants: Secondary | ICD-10-CM | POA: Diagnosis not present

## 2017-07-15 DIAGNOSIS — I481 Persistent atrial fibrillation: Secondary | ICD-10-CM

## 2017-07-15 DIAGNOSIS — I4891 Unspecified atrial fibrillation: Secondary | ICD-10-CM | POA: Diagnosis not present

## 2017-07-15 DIAGNOSIS — I4819 Other persistent atrial fibrillation: Secondary | ICD-10-CM

## 2017-07-15 LAB — POCT INR: INR: 2

## 2017-07-15 NOTE — Patient Instructions (Signed)
Description   Continue with 1/2 tablet daily except 1 tablet each Sunday, Tuesday and Thursday.  Repeat INR in 6 weeks

## 2017-07-20 ENCOUNTER — Other Ambulatory Visit: Payer: Self-pay | Admitting: Hematology & Oncology

## 2017-07-28 ENCOUNTER — Inpatient Hospital Stay: Payer: Medicare Other | Attending: Hematology & Oncology

## 2017-07-28 ENCOUNTER — Encounter: Payer: Self-pay | Admitting: Hematology & Oncology

## 2017-07-28 ENCOUNTER — Inpatient Hospital Stay (HOSPITAL_BASED_OUTPATIENT_CLINIC_OR_DEPARTMENT_OTHER): Payer: Medicare Other | Admitting: Hematology & Oncology

## 2017-07-28 ENCOUNTER — Inpatient Hospital Stay: Payer: Medicare Other

## 2017-07-28 ENCOUNTER — Other Ambulatory Visit: Payer: Self-pay

## 2017-07-28 VITALS — BP 127/65 | HR 79 | Temp 98.1°F | Resp 18 | Wt 170.0 lb

## 2017-07-28 DIAGNOSIS — C7951 Secondary malignant neoplasm of bone: Secondary | ICD-10-CM | POA: Diagnosis not present

## 2017-07-28 DIAGNOSIS — C50919 Malignant neoplasm of unspecified site of unspecified female breast: Secondary | ICD-10-CM

## 2017-07-28 DIAGNOSIS — I4891 Unspecified atrial fibrillation: Secondary | ICD-10-CM | POA: Diagnosis not present

## 2017-07-28 DIAGNOSIS — Z79811 Long term (current) use of aromatase inhibitors: Secondary | ICD-10-CM

## 2017-07-28 DIAGNOSIS — Z79899 Other long term (current) drug therapy: Secondary | ICD-10-CM | POA: Insufficient documentation

## 2017-07-28 DIAGNOSIS — M791 Myalgia, unspecified site: Secondary | ICD-10-CM | POA: Diagnosis not present

## 2017-07-28 DIAGNOSIS — Z881 Allergy status to other antibiotic agents status: Secondary | ICD-10-CM | POA: Diagnosis not present

## 2017-07-28 DIAGNOSIS — Z880 Allergy status to penicillin: Secondary | ICD-10-CM | POA: Insufficient documentation

## 2017-07-28 DIAGNOSIS — Z7901 Long term (current) use of anticoagulants: Secondary | ICD-10-CM | POA: Diagnosis not present

## 2017-07-28 DIAGNOSIS — R42 Dizziness and giddiness: Secondary | ICD-10-CM

## 2017-07-28 DIAGNOSIS — C50912 Malignant neoplasm of unspecified site of left female breast: Secondary | ICD-10-CM

## 2017-07-28 LAB — CBC WITH DIFFERENTIAL (CANCER CENTER ONLY)
Basophils Absolute: 0 10*3/uL (ref 0.0–0.1)
Basophils Relative: 1 %
Eosinophils Absolute: 0.2 10*3/uL (ref 0.0–0.5)
Eosinophils Relative: 5 %
HCT: 40.6 % (ref 34.8–46.6)
Hemoglobin: 13.4 g/dL (ref 11.6–15.9)
Lymphocytes Relative: 27 %
Lymphs Abs: 1.2 10*3/uL (ref 0.9–3.3)
MCH: 29.1 pg (ref 26.0–34.0)
MCHC: 33 g/dL (ref 32.0–36.0)
MCV: 88.1 fL (ref 81.0–101.0)
Monocytes Absolute: 0.4 10*3/uL (ref 0.1–0.9)
Monocytes Relative: 9 %
Neutro Abs: 2.6 10*3/uL (ref 1.5–6.5)
Neutrophils Relative %: 58 %
Platelet Count: 178 10*3/uL (ref 145–400)
RBC: 4.61 MIL/uL (ref 3.70–5.32)
RDW: 14.7 % (ref 11.1–15.7)
WBC Count: 4.4 10*3/uL (ref 3.9–10.0)

## 2017-07-28 LAB — CMP (CANCER CENTER ONLY)
ALT: 21 U/L (ref 10–47)
AST: 27 U/L (ref 11–38)
Albumin: 3.6 g/dL (ref 3.5–5.0)
Alkaline Phosphatase: 98 U/L — ABNORMAL HIGH (ref 26–84)
Anion gap: 4 — ABNORMAL LOW (ref 5–15)
BUN: 11 mg/dL (ref 7–22)
CO2: 29 mmol/L (ref 18–33)
Calcium: 9.6 mg/dL (ref 8.0–10.3)
Chloride: 113 mmol/L — ABNORMAL HIGH (ref 98–108)
Creatinine: 1.1 mg/dL (ref 0.60–1.20)
Glucose, Bld: 96 mg/dL (ref 73–118)
Potassium: 4.5 mmol/L (ref 3.3–4.7)
Sodium: 146 mmol/L — ABNORMAL HIGH (ref 128–145)
Total Bilirubin: 2.3 mg/dL — ABNORMAL HIGH (ref 0.2–1.6)
Total Protein: 6.9 g/dL (ref 6.4–8.1)

## 2017-07-28 MED ORDER — DENOSUMAB 120 MG/1.7ML ~~LOC~~ SOLN
SUBCUTANEOUS | Status: AC
  Filled 2017-07-28: qty 1.7

## 2017-07-28 NOTE — Progress Notes (Signed)
Hematology and Oncology Follow Up Visit  TEASIA ZAPF [id number] 1925-12-08 84 y.o. 07/28/2017   Principle Diagnosis:  Metastatic breast cancer, bone metastasis only.  Current Therapy:   1. Femara 2.5 mg p.o. daily. 2. Xgeva 120mg  sq q 6 month     Interim History:  Ms.  Nussbaumer is for followup.  We see her every 6 months.  She seems to be doing fairly well although atrial fibrillation is her biggest issue.  It sounds like she might need to be cardioverted.  She has had no problems with cough or shortness of breath.  She has had no bleeding.  She is on Coumadin.  Her last CA 27.29 actually was down from 52 to 39.  Her appetite is doing well.  She is had no nausea or vomiting.  She does have bad arthritis.  Her last bone scan was then a year ago.  I think be worthwhile to do another one.  She has had no fever.  She has had no rashes.  She has had no leg swelling.  Overall, her performance status is ECOG 2.    Medications:  Current Outpatient Medications:  .  albuterol (PROVENTIL HFA;VENTOLIN HFA) 108 (90 BASE) MCG/ACT inhaler, Inhale 2 puffs into the lungs every 6 (six) hours as needed for wheezing or shortness of breath., Disp: , Rfl:  .  ALPRAZolam (XANAX) 0.5 MG tablet, Take 0.25 mg by mouth 2 (two) times daily as needed for anxiety. , Disp: , Rfl: 0 .  bisoprolol (ZEBETA) 5 MG tablet, Take 2 tablets (10 mg total) by mouth daily., Disp: 60 tablet, Rfl: 5 .  furosemide (LASIX) 40 MG tablet, Take 20 mg daily by mouth., Disp: , Rfl:  .  letrozole (FEMARA) 2.5 MG tablet, TAKE ONE TABLET BY MOUTH ONCE DAILY, Disp: 30 tablet, Rfl: 0 .  levothyroxine (SYNTHROID, LEVOTHROID) 100 MCG tablet, Take 1 tablet daily by mouth., Disp: , Rfl: 3 .  tetrahydrozoline-zinc (VISINE-AC) 0.05-0.25 % ophthalmic solution, Place 2 drops into both eyes 3 (three) times daily as needed (red eyes)., Disp: , Rfl:  .  Vitamin D, Ergocalciferol, (DRISDOL) 50000 units CAPS capsule, TAKE 1 CAPSULE BY MOUTH 1  TIME A WEEK, Disp: 4 capsule, Rfl: 0 .  warfarin (COUMADIN) 5 MG tablet, TAKE ONE-HALF TO 1 TABLET BY MOUTH DAILY AS DIRECTED BY COUMADIN CLINIC, Disp: 90 tablet, Rfl: 0  Allergies:  Allergies  Allergen Reactions  . Crestor [Rosuvastatin]   . Ciprofloxacin Rash    Rash in the mouth  . Penicillins Hives and Rash  . Vancomycin Rash    "Red-man"    Past Medical History, Surgical history, Social history, and Family History were reviewed and updated.  Review of Systems: Review of Systems  Constitutional: Negative.   HENT: Negative.   Eyes: Negative.   Respiratory: Negative.   Cardiovascular: Positive for palpitations.  Gastrointestinal: Negative.   Genitourinary: Positive for frequency.  Musculoskeletal: Positive for joint pain and myalgias.  Skin: Negative.   Neurological: Positive for dizziness.  Endo/Heme/Allergies: Negative.   Psychiatric/Behavioral: Negative.      Physical Exam:  weight is 170 lb (77.1 kg). Her oral temperature is 98.1 F (36.7 C). Her blood pressure is 127/65 and her pulse is 79. Her respiration is 18 and oxygen saturation is 95%.   Physical Exam  Constitutional: She is oriented to person, place, and time.  HENT:  Head: Normocephalic and atraumatic.  Mouth/Throat: Oropharynx is clear and moist.  Eyes: EOM are normal. Pupils are equal,  round, and reactive to light.  Neck: Normal range of motion.  Cardiovascular: Normal rate, regular rhythm and normal heart sounds.  Pulmonary/Chest: Effort normal and breath sounds normal.  Abdominal: Soft. Bowel sounds are normal.  Musculoskeletal: Normal range of motion. She exhibits no edema, tenderness or deformity.  Lymphadenopathy:    She has no cervical adenopathy.  Neurological: She is alert and oriented to person, place, and time.  Skin: Skin is warm and dry. No rash noted. No erythema.  Psychiatric: She has a normal mood and affect. Her behavior is normal. Judgment and thought content normal.  Vitals  reviewed.   Lab Results  Component Value Date   WBC 4.4 07/28/2017   HGB 12.2 01/25/2017   HCT 40.6 07/28/2017   MCV 88.1 07/28/2017   PLT 178 07/28/2017     Chemistry      Component Value Date/Time   NA 146 (H) 07/28/2017 1413   NA 146 (H) 06/11/2017 1614   NA 146 (H) 01/25/2017 1448   NA 142 07/31/2015 1203   K 4.5 07/28/2017 1413   K 4.0 01/25/2017 1448   K 5.1 07/31/2015 1203   CL 113 (H) 07/28/2017 1413   CL 108 01/25/2017 1448   CO2 29 07/28/2017 1413   CO2 29 01/25/2017 1448   CO2 23 07/31/2015 1203   BUN 11 07/28/2017 1413   BUN 13 06/11/2017 1614   BUN 18 01/25/2017 1448   BUN 23.3 07/31/2015 1203   CREATININE 1.10 07/28/2017 1413   CREATININE 1.2 01/25/2017 1448   CREATININE 1.1 07/31/2015 1203      Component Value Date/Time   CALCIUM 9.6 07/28/2017 1413   CALCIUM 9.3 01/25/2017 1448   CALCIUM 9.3 07/31/2015 1203   ALKPHOS 98 (H) 07/28/2017 1413   ALKPHOS 64 01/25/2017 1448   ALKPHOS 57 07/31/2015 1203   AST 27 07/28/2017 1413   AST 18 07/31/2015 1203   ALT 21 07/28/2017 1413   ALT 20 01/25/2017 1448   ALT 15 07/31/2015 1203   BILITOT 2.3 (H) 07/28/2017 1413   BILITOT 0.96 07/31/2015 1203         Impression and Plan: Ms. Huebert is a 84 year old white female with metastatic breast cancer. Her disease is confined to her bones. She has been stable. She has been stable now for about 13 years. She's done incredibly well.   She will not take any Xgeva now.  She does not want Xgeva because of the cost.  She is a little irritated at the [facility] health cancer system before there behavior with respect to her trying to pay for the [facility].  I believe this is unfortunate for her.  We will get the bone scan.  If she needs a cardioversion, I do not see a problem with her having this.  I would like to get her back to see [place] in another 6 months.   Volanda Napoleon, MD 2/20/20193:23 PM

## 2017-07-29 LAB — CANCER ANTIGEN 27.29: CA 27.29: 45.1 U/mL — ABNORMAL HIGH (ref 0.0–38.6)

## 2017-08-01 ENCOUNTER — Emergency Department (HOSPITAL_COMMUNITY): Payer: Medicare Other

## 2017-08-01 ENCOUNTER — Other Ambulatory Visit: Payer: Self-pay

## 2017-08-01 ENCOUNTER — Emergency Department (HOSPITAL_COMMUNITY)
Admission: EM | Admit: 2017-08-01 | Discharge: 2017-08-02 | Disposition: A | Discharge status: Home / Self Care | Payer: Medicare Other | Attending: Emergency Medicine | Admitting: Emergency Medicine

## 2017-08-01 ENCOUNTER — Encounter (HOSPITAL_COMMUNITY): Payer: Self-pay | Admitting: Emergency Medicine

## 2017-08-01 DIAGNOSIS — I10 Essential (primary) hypertension: Secondary | ICD-10-CM | POA: Insufficient documentation

## 2017-08-01 DIAGNOSIS — J45909 Unspecified asthma, uncomplicated: Secondary | ICD-10-CM | POA: Insufficient documentation

## 2017-08-01 DIAGNOSIS — J449 Chronic obstructive pulmonary disease, unspecified: Secondary | ICD-10-CM | POA: Insufficient documentation

## 2017-08-01 DIAGNOSIS — S299XXA Unspecified injury of thorax, initial encounter: Secondary | ICD-10-CM | POA: Diagnosis not present

## 2017-08-01 DIAGNOSIS — S22000A Wedge compression fracture of unspecified thoracic vertebra, initial encounter for closed fracture: Secondary | ICD-10-CM | POA: Diagnosis not present

## 2017-08-01 DIAGNOSIS — Z79899 Other long term (current) drug therapy: Secondary | ICD-10-CM | POA: Insufficient documentation

## 2017-08-01 DIAGNOSIS — Y929 Unspecified place or not applicable: Secondary | ICD-10-CM | POA: Insufficient documentation

## 2017-08-01 DIAGNOSIS — R110 Nausea: Secondary | ICD-10-CM | POA: Diagnosis not present

## 2017-08-01 DIAGNOSIS — Z950 Presence of cardiac pacemaker: Secondary | ICD-10-CM | POA: Diagnosis not present

## 2017-08-01 DIAGNOSIS — S3992XA Unspecified injury of lower back, initial encounter: Secondary | ICD-10-CM | POA: Diagnosis not present

## 2017-08-01 DIAGNOSIS — Y939 Activity, unspecified: Secondary | ICD-10-CM | POA: Diagnosis not present

## 2017-08-01 DIAGNOSIS — Y999 Unspecified external cause status: Secondary | ICD-10-CM | POA: Insufficient documentation

## 2017-08-01 DIAGNOSIS — M4854XA Collapsed vertebra, not elsewhere classified, thoracic region, initial encounter for fracture: Secondary | ICD-10-CM | POA: Diagnosis not present

## 2017-08-01 DIAGNOSIS — K2970 Gastritis, unspecified, without bleeding: Secondary | ICD-10-CM | POA: Diagnosis not present

## 2017-08-01 DIAGNOSIS — M549 Dorsalgia, unspecified: Secondary | ICD-10-CM | POA: Diagnosis not present

## 2017-08-01 DIAGNOSIS — S32020A Wedge compression fracture of second lumbar vertebra, initial encounter for closed fracture: Secondary | ICD-10-CM

## 2017-08-01 DIAGNOSIS — X509XXA Other and unspecified overexertion or strenuous movements or postures, initial encounter: Secondary | ICD-10-CM | POA: Insufficient documentation

## 2017-08-01 DIAGNOSIS — M4856XA Collapsed vertebra, not elsewhere classified, lumbar region, initial encounter for fracture: Secondary | ICD-10-CM | POA: Diagnosis not present

## 2017-08-01 LAB — BASIC METABOLIC PANEL
Anion gap: 10 (ref 5–15)
BUN: 12 mg/dL (ref 6–20)
CO2: 22 mmol/L (ref 22–32)
Calcium: 9.1 mg/dL (ref 8.9–10.3)
Chloride: 109 mmol/L (ref 101–111)
Creatinine, Ser: 0.92 mg/dL (ref 0.44–1.00)
GFR calc Af Amer: >60 mL/min (ref >60)
GFR calc non Af Amer: 54 mL/min — ABNORMAL LOW (ref >60)
Glucose, Bld: 111 mg/dL — ABNORMAL HIGH (ref 65–99)
Potassium: 4.1 mmol/L (ref 3.5–5.1)
Sodium: 141 mmol/L (ref 135–145)

## 2017-08-01 LAB — I-STAT CHEM 8, ED
BUN: 15 mg/dL (ref 6–20)
Calcium, Ion: 1.16 mmol/L (ref 1.15–1.40)
Chloride: 107 mmol/L (ref 101–111)
Creatinine, Ser: 0.8 mg/dL (ref 0.44–1.00)
Glucose, Bld: 112 mg/dL — ABNORMAL HIGH (ref 65–99)
HCT: 39 % (ref 36.0–46.0)
Hemoglobin: 13.3 g/dL (ref 12.0–15.0)
Potassium: 4.2 mmol/L (ref 3.5–5.1)
Sodium: 143 mmol/L (ref 135–145)
TCO2: 26 mmol/L (ref 22–32)

## 2017-08-01 LAB — CBC WITH DIFFERENTIAL/PLATELET
Basophils Absolute: 0 10*3/uL (ref 0.0–0.1)
Basophils Relative: 0 %
Eosinophils Absolute: 0.1 10*3/uL (ref 0.0–0.7)
Eosinophils Relative: 3 %
HCT: 40.4 % (ref 36.0–46.0)
Hemoglobin: 13.2 g/dL (ref 12.0–15.0)
Lymphocytes Relative: 24 %
Lymphs Abs: 1.1 10*3/uL (ref 0.7–4.0)
MCH: 28.4 pg (ref 26.0–34.0)
MCHC: 32.7 g/dL (ref 30.0–36.0)
MCV: 86.9 fL (ref 78.0–100.0)
Monocytes Absolute: 0.5 10*3/uL (ref 0.1–1.0)
Monocytes Relative: 11 %
Neutro Abs: 2.8 10*3/uL (ref 1.7–7.7)
Neutrophils Relative %: 62 %
Platelets: 160 10*3/uL (ref 150–400)
RBC: 4.65 MIL/uL (ref 3.87–5.11)
RDW: 14.8 % (ref 11.5–15.5)
WBC: 4.5 10*3/uL (ref 4.0–10.5)

## 2017-08-01 LAB — PROTIME-INR
INR: 1.86
Prothrombin Time: 21.3 seconds — ABNORMAL HIGH (ref 11.4–15.2)

## 2017-08-01 LAB — POC OCCULT BLOOD, ED: Fecal Occult Bld: NEGATIVE

## 2017-08-01 MED ORDER — MORPHINE SULFATE 15 MG PO TABS: 7.5000 mg | ORAL_TABLET | ORAL | 0 refills | Status: DC | PRN

## 2017-08-01 MED ORDER — GADOBENATE DIMEGLUMINE 529 MG/ML IV SOLN
15.0000 mL | Freq: Once | INTRAVENOUS | Status: AC | PRN
  Administered 2017-08-01: 15 mL via INTRAVENOUS

## 2017-08-01 MED ORDER — SODIUM CHLORIDE 0.9 % IV BOLUS (SEPSIS)
1000.0000 mL | Freq: Once | INTRAVENOUS | Status: AC
  Administered 2017-08-01: 1000 mL via INTRAVENOUS

## 2017-08-01 MED ORDER — OXYCODONE HCL 5 MG PO TABS
5.0000 mg | ORAL_TABLET | Freq: Once | ORAL | Status: AC
  Administered 2017-08-01: 5 mg via ORAL
  Filled 2017-08-01: qty 1

## 2017-08-01 MED ORDER — ACETAMINOPHEN 500 MG PO TABS
1000.0000 mg | ORAL_TABLET | Freq: Once | ORAL | Status: DC
  Filled 2017-08-01: qty 2

## 2017-08-01 NOTE — Progress Notes (Signed)
[employer]tr[employer] on site to place pacemaker into MRI surescan settings.  Discussed case with radiologist Dr Jobe Igo for risk vs benefit on doing patient at night on the weekend.  Dr Jobe Igo cleared the patient for the MRI tonight as long as MRI conditions of the pacemaker were met

## 2017-08-01 NOTE — ED Triage Notes (Signed)
Pt arrives from home c/o severe back pain and 2 episodes of dark tarry stools since last night.  Pt reports hx bone cancer, reports pain uncontrollable today.  Pt reports taking coumadin, reports nausea with pain, denies abd pain, diarrhea.  AOx4.

## 2017-08-01 NOTE — ED Notes (Addendum)
SN in MRI with patient for entire scan due to implanted pacemaker
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
## 2017-08-01 NOTE — ED Notes (Signed)
Pt reports falling on 07/22/17, landing on back. Pt denies being evaluated for fall.

## 2017-08-01 NOTE — Discharge Instructions (Signed)
Take tylenol 1000mg(2 extra strength) four times a day.  ° °Then take the pain medicine if you feel like you need it. Narcotics do not help with the pain, they only make you care about it less.  You can become addicted to this, people may break into your house to steal it.  It will constipate you.  If you drive under the influence of this medicine you can get a DUI.   ° °

## 2017-08-01 NOTE — ED Notes (Signed)
Pt c/o difficulty swallowing when told pain meds were by mouth; SN performed swallow screen prior to administration

## 2017-08-01 NOTE — ED Notes (Signed)
Pt's granddtr contact info: Sherre Poot [phone]

## 2017-08-02 ENCOUNTER — Telehealth: Payer: Self-pay | Admitting: *Deleted

## 2017-08-02 NOTE — Telephone Encounter (Signed)
Patient was seen in the ED on Sunday and diagnosed with some spinal fractures. She stated she was told to follow up with Dr Marin Olp.  Reviewed the visit with Dr Marin Olp. He states that she needs to follow up with her orthopedist.   Spoke with patient. She has an orthopedist, Dr Delilah Shan, but he has only treated her ankle. She's not sure that he would be able to treat the back.   Reviewed scan again with Dr Marin Olp. He states Dr Delilah Shan should be able to help patient with assessment and treatment.  Patient is aware of recommendation. She will call his office.

## 2017-08-04 ENCOUNTER — Encounter: Payer: Medicare Other | Admitting: Cardiovascular Disease

## 2017-08-04 LAB — CUP PACEART REMOTE DEVICE CHECK
Battery Remaining Longevity: 76 mo
Battery Voltage: 3.01 V
Brady Statistic AP VP Percent: 0.68 %
Brady Statistic AP VS Percent: 0.13 %
Brady Statistic AS VP Percent: 35.8 %
Brady Statistic AS VS Percent: 63.39 %
Brady Statistic RA Percent Paced: 0.62 %
Brady Statistic RV Percent Paced: 35.66 %
Date Time Interrogation Session: 20190205175913
Implantable Lead Implant Date: 20160223
Implantable Lead Implant Date: 20160223
Implantable Lead Location: 753859
Implantable Lead Location: 753860
Implantable Lead Model: 5076
Implantable Lead Model: 5076
Implantable Pulse Generator Implant Date: 20160223
Lead Channel Impedance Value: 323 Ohm
Lead Channel Impedance Value: 418 Ohm
Lead Channel Impedance Value: 437 Ohm
Lead Channel Impedance Value: 494 Ohm
Lead Channel Pacing Threshold Amplitude: 0.75 V
Lead Channel Pacing Threshold Amplitude: 0.875 V
Lead Channel Pacing Threshold Pulse Width: 0.4 ms
Lead Channel Pacing Threshold Pulse Width: 0.4 ms
Lead Channel Sensing Intrinsic Amplitude: 0.625 mV
Lead Channel Sensing Intrinsic Amplitude: 0.625 mV
Lead Channel Sensing Intrinsic Amplitude: 11.75 mV
Lead Channel Sensing Intrinsic Amplitude: 11.75 mV
Lead Channel Setting Pacing Amplitude: 1.5 V
Lead Channel Setting Pacing Amplitude: 2.25 V
Lead Channel Setting Pacing Pulse Width: 0.4 ms
Lead Channel Setting Sensing Sensitivity: 0.9 mV

## 2017-08-07 ENCOUNTER — Other Ambulatory Visit: Payer: Self-pay | Admitting: Hematology & Oncology

## 2017-08-07 DIAGNOSIS — E559 Vitamin D deficiency, unspecified: Secondary | ICD-10-CM

## 2017-08-11 DIAGNOSIS — C7951 Secondary malignant neoplasm of bone: Secondary | ICD-10-CM | POA: Diagnosis not present

## 2017-08-11 DIAGNOSIS — C50919 Malignant neoplasm of unspecified site of unspecified female breast: Secondary | ICD-10-CM | POA: Diagnosis not present

## 2017-08-11 DIAGNOSIS — M8088XA Other osteoporosis with current pathological fracture, vertebra(e), initial encounter for fracture: Secondary | ICD-10-CM | POA: Diagnosis not present

## 2017-08-12 ENCOUNTER — Other Ambulatory Visit (HOSPITAL_COMMUNITY): Payer: Medicare Other

## 2017-08-12 ENCOUNTER — Encounter (HOSPITAL_COMMUNITY): Payer: Medicare Other

## 2017-08-16 DIAGNOSIS — S32020A Wedge compression fracture of second lumbar vertebra, initial encounter for closed fracture: Secondary | ICD-10-CM | POA: Diagnosis not present

## 2017-08-16 DIAGNOSIS — M25551 Pain in right hip: Secondary | ICD-10-CM | POA: Diagnosis not present

## 2017-08-17 DIAGNOSIS — Z7901 Long term (current) use of anticoagulants: Secondary | ICD-10-CM | POA: Diagnosis not present

## 2017-08-17 DIAGNOSIS — C50919 Malignant neoplasm of unspecified site of unspecified female breast: Secondary | ICD-10-CM | POA: Diagnosis not present

## 2017-08-17 DIAGNOSIS — J449 Chronic obstructive pulmonary disease, unspecified: Secondary | ICD-10-CM | POA: Diagnosis not present

## 2017-08-17 DIAGNOSIS — M8088XD Other osteoporosis with current pathological fracture, vertebra(e), subsequent encounter for fracture with routine healing: Secondary | ICD-10-CM | POA: Diagnosis not present

## 2017-08-17 DIAGNOSIS — K219 Gastro-esophageal reflux disease without esophagitis: Secondary | ICD-10-CM | POA: Diagnosis not present

## 2017-08-17 DIAGNOSIS — Z950 Presence of cardiac pacemaker: Secondary | ICD-10-CM | POA: Diagnosis not present

## 2017-08-17 DIAGNOSIS — E039 Hypothyroidism, unspecified: Secondary | ICD-10-CM | POA: Diagnosis not present

## 2017-08-17 DIAGNOSIS — I4891 Unspecified atrial fibrillation: Secondary | ICD-10-CM | POA: Diagnosis not present

## 2017-08-17 DIAGNOSIS — K5790 Diverticulosis of intestine, part unspecified, without perforation or abscess without bleeding: Secondary | ICD-10-CM | POA: Diagnosis not present

## 2017-08-17 DIAGNOSIS — Z9181 History of falling: Secondary | ICD-10-CM | POA: Diagnosis not present

## 2017-08-17 DIAGNOSIS — I129 Hypertensive chronic kidney disease with stage 1 through stage 4 chronic kidney disease, or unspecified chronic kidney disease: Secondary | ICD-10-CM | POA: Diagnosis not present

## 2017-08-17 DIAGNOSIS — C7951 Secondary malignant neoplasm of bone: Secondary | ICD-10-CM | POA: Diagnosis not present

## 2017-08-17 DIAGNOSIS — N183 Chronic kidney disease, stage 3 (moderate): Secondary | ICD-10-CM | POA: Diagnosis not present

## 2017-08-19 ENCOUNTER — Encounter (HOSPITAL_COMMUNITY)
Admission: RE | Admit: 2017-08-19 | Discharge: 2017-08-19 | Disposition: A | Discharge status: Home / Self Care | Payer: Medicare Other | Source: Ambulatory Visit | Attending: Hematology & Oncology | Admitting: Hematology & Oncology

## 2017-08-19 DIAGNOSIS — C7951 Secondary malignant neoplasm of bone: Secondary | ICD-10-CM | POA: Insufficient documentation

## 2017-08-19 DIAGNOSIS — M438X4 Other specified deforming dorsopathies, thoracic region: Secondary | ICD-10-CM | POA: Diagnosis not present

## 2017-08-19 DIAGNOSIS — C50919 Malignant neoplasm of unspecified site of unspecified female breast: Secondary | ICD-10-CM

## 2017-08-19 DIAGNOSIS — Z853 Personal history of malignant neoplasm of breast: Secondary | ICD-10-CM | POA: Diagnosis not present

## 2017-08-19 MED ORDER — TECHNETIUM TC 99M MEDRONATE IV KIT
19.8000 | PACK | Freq: Once | INTRAVENOUS | Status: AC | PRN
  Administered 2017-08-19: 19.8 via INTRAVENOUS

## 2017-08-22 ENCOUNTER — Other Ambulatory Visit: Payer: Self-pay | Admitting: Hematology & Oncology

## 2017-08-23 ENCOUNTER — Telehealth: Payer: Self-pay | Admitting: Hematology & Oncology

## 2017-08-23 ENCOUNTER — Other Ambulatory Visit: Payer: Self-pay | Admitting: Hematology & Oncology

## 2017-08-23 DIAGNOSIS — C7951 Secondary malignant neoplasm of bone: Principal | ICD-10-CM

## 2017-08-23 DIAGNOSIS — C50911 Malignant neoplasm of unspecified site of right female breast: Secondary | ICD-10-CM

## 2017-08-23 NOTE — Telephone Encounter (Signed)
I left a message on Amy White's answering machine this afternoon.  I gave her the results of the bone scan.  I am worried that there are new areas of disease.  As such, we are going to have to make a change in her therapy.  She has been on Femara for at least 10 years.  We may switch her over to Aromasin and add Ibrance or ribociclib.  I want to see her on March 20 at 4 PM.  I told her this on the message that I left her.  She can call the office if she has any questions.  I will send a message to our scheduler to get her scheduled to see me on Wednesday at 4 PM.  Lattie Haw, MD

## 2017-08-25 ENCOUNTER — Inpatient Hospital Stay (HOSPITAL_BASED_OUTPATIENT_CLINIC_OR_DEPARTMENT_OTHER): Payer: Medicare Other | Admitting: Hematology & Oncology

## 2017-08-25 ENCOUNTER — Other Ambulatory Visit: Payer: Self-pay

## 2017-08-25 ENCOUNTER — Encounter: Payer: Self-pay | Admitting: Hematology & Oncology

## 2017-08-25 ENCOUNTER — Inpatient Hospital Stay: Payer: Medicare Other | Attending: Hematology & Oncology

## 2017-08-25 VITALS — BP 123/70 | HR 76 | Temp 97.8°F | Resp 20 | Wt 169.0 lb

## 2017-08-25 DIAGNOSIS — Z79811 Long term (current) use of aromatase inhibitors: Secondary | ICD-10-CM | POA: Insufficient documentation

## 2017-08-25 DIAGNOSIS — C7951 Secondary malignant neoplasm of bone: Secondary | ICD-10-CM | POA: Insufficient documentation

## 2017-08-25 DIAGNOSIS — Z79899 Other long term (current) drug therapy: Secondary | ICD-10-CM | POA: Diagnosis not present

## 2017-08-25 DIAGNOSIS — M791 Myalgia, unspecified site: Secondary | ICD-10-CM | POA: Diagnosis not present

## 2017-08-25 DIAGNOSIS — Z9181 History of falling: Secondary | ICD-10-CM | POA: Insufficient documentation

## 2017-08-25 DIAGNOSIS — M549 Dorsalgia, unspecified: Secondary | ICD-10-CM | POA: Diagnosis not present

## 2017-08-25 DIAGNOSIS — C50919 Malignant neoplasm of unspecified site of unspecified female breast: Secondary | ICD-10-CM | POA: Diagnosis not present

## 2017-08-25 DIAGNOSIS — Z881 Allergy status to other antibiotic agents status: Secondary | ICD-10-CM | POA: Diagnosis not present

## 2017-08-25 DIAGNOSIS — Z170 Estrogen receptor positive status [ER+]: Secondary | ICD-10-CM | POA: Diagnosis not present

## 2017-08-25 DIAGNOSIS — Z885 Allergy status to narcotic agent status: Secondary | ICD-10-CM | POA: Diagnosis not present

## 2017-08-25 DIAGNOSIS — Z880 Allergy status to penicillin: Secondary | ICD-10-CM | POA: Insufficient documentation

## 2017-08-25 DIAGNOSIS — C50911 Malignant neoplasm of unspecified site of right female breast: Secondary | ICD-10-CM

## 2017-08-25 MED ORDER — ABEMACICLIB 100 MG PO TABS: 100.0000 mg | ORAL_TABLET | Freq: Two times a day (BID) | ORAL | 12 refills | Status: DC

## 2017-08-25 MED ORDER — EXEMESTANE 25 MG PO TABS: 25.0000 mg | ORAL_TABLET | Freq: Every day | ORAL | 12 refills | Status: DC

## 2017-08-25 NOTE — Progress Notes (Signed)
Hematology and Oncology Follow Up Visit  Amy White [id number] 08-Apr-1926 84 y.o. 08/25/2017   Principle Diagnosis:   Metastatic breast cancer-bone only metastasis  Current Therapy:    Aromasin 25 mg p.o. Daily  Versenio 100 mg po BID   Xgeva 120 mg sq q 3 months - next dose 09/2017 back for     Interim History:  Amy White is an early visit.  I had seen her originally back in February.  I have not seen her for about 14 or 15 years.  She has metastatic breast cancer with bone metastasis.  She has been on Femara.  She is not wanted Delton See because of the cost issues.  After I saw her, she fell at home.  She sustained a fracture in her back at T12.  I think there also was an issue at L2.  These were non-pathologic.  However, the radiologist commented that there were some possible new areas on the MRI that could have suggested metastatic breast cancer that was progressing.  We did a bone scan.  This was done on March 14.  This showed activity that was new at L5 vertebral body.  Also noted was a new focus of activity in the anterior right sixth rib.  There is some abnormal focus in the right sacrum.  She had activity at T12 corresponding to the recent compression fracture.  She is wearing a brace.  It was not felt that kyphoplasty would help.  Her CA 27.29 in February was 45 which was stable.  She really has done well on Femara.  However, I think it is now time that we get her on some different.  I believe that this is warranted.  I think that the combination of Aromasin with Verzenio would be reasonable.  She is quite elderly.  She just had her 89th birthday on Monday.  Quality of life is what is important for Amy White.  This has always been a priority for her.  She comes in with her granddaughter.  Her appetite is okay.  She just hates wearing this brace.  She sees the spine surgeon in April.  Overall, her performance status is ECOG 2.  Medications:  Current  Outpatient Medications:  .  albuterol (PROVENTIL HFA;VENTOLIN HFA) 108 (90 BASE) MCG/ACT inhaler, Inhale 2 puffs into the lungs every 6 (six) hours as needed for wheezing or shortness of breath., Disp: , Rfl:  .  ALPRAZolam (XANAX) 0.5 MG tablet, Take 0.25 mg by mouth 2 (two) times daily as needed for anxiety. , Disp: , Rfl: 0 .  bisoprolol (ZEBETA) 5 MG tablet, Take 2 tablets (10 mg total) by mouth daily., Disp: 60 tablet, Rfl: 5 .  furosemide (LASIX) 40 MG tablet, Take 20 mg daily by mouth., Disp: , Rfl:  .  HYDROcodone-acetaminophen (NORCO/VICODIN) 5-325 MG tablet, TK 1 T PO Q 6 H PRF PAIN, Disp: , Rfl: 0 .  letrozole (FEMARA) 2.5 MG tablet, TAKE ONE TABLET BY MOUTH ONCE DAILY, Disp: 30 tablet, Rfl: 0 .  levothyroxine (SYNTHROID, LEVOTHROID) 100 MCG tablet, Take 1 tablet daily by mouth., Disp: , Rfl: 3 .  morphine (MSIR) 15 MG tablet, Take 0.5 tablets (7.5 mg total) by mouth every 4 (four) hours as needed for severe pain., Disp: 5 tablet, Rfl: 0 .  oxyCODONE-acetaminophen (PERCOCET/ROXICET) 5-325 MG tablet, TK 1 T PO Q 4 H PRN P, Disp: , Rfl: 0 .  tetrahydrozoline-zinc (VISINE-AC) 0.05-0.25 % ophthalmic solution, Place 2 drops into both eyes 3 (  three) times daily as needed (red eyes)., Disp: , Rfl:  .  Vitamin D, Ergocalciferol, (DRISDOL) 50000 units CAPS capsule, TAKE 1 CAPSULE BY MOUTH 1 TIME A WEEK, Disp: 4 capsule, Rfl: 0 .  warfarin (COUMADIN) 5 MG tablet, TAKE ONE-HALF TO 1 TABLET BY MOUTH DAILY AS DIRECTED BY COUMADIN CLINIC, Disp: 90 tablet, Rfl: 0  Allergies:  Allergies  Allergen Reactions  . Morphine And Related Rash  . Crestor [Rosuvastatin]   . Ciprofloxacin Rash    Rash in the mouth  . Penicillins Hives and Rash  . Vancomycin Rash    "Red-man"    Past Medical History, Surgical history, Social history, and Family History were reviewed and updated.  Review of Systems: Review of Systems  Constitutional: Negative.   HENT:  Negative.   Eyes: Negative.   Respiratory:  Negative.   Cardiovascular: Negative.   Gastrointestinal: Negative.   Endocrine: Negative.   Genitourinary: Negative.    Musculoskeletal: Positive for back pain and myalgias.  Skin: Negative.   Neurological: Negative.   Hematological: Negative.   Psychiatric/Behavioral: Negative.     Physical Exam:  weight is 169 lb (76.7 kg). Her oral temperature is 97.8 F (36.6 C). Her blood pressure is 123/70 and her pulse is 76. Her respiration is 20 and oxygen saturation is 96%.   Wt Readings from Last 3 Encounters:  08/25/17 169 lb (76.7 kg)  07/28/17 170 lb (77.1 kg)  06/11/17 172 lb 3.2 oz (78.1 kg)    Physical Exam  Constitutional: She is oriented to person, place, and time.  HENT:  Head: Normocephalic and atraumatic.  Mouth/Throat: Oropharynx is clear and moist.  Eyes: EOM are normal. Pupils are equal, round, and reactive to light.  Neck: Normal range of motion.  Cardiovascular: Normal rate, regular rhythm and normal heart sounds.  Pulmonary/Chest: Effort normal and breath sounds normal.  Abdominal: Soft. Bowel sounds are normal.  Musculoskeletal: Normal range of motion. She exhibits no edema, tenderness or deformity.  Lymphadenopathy:    She has no cervical adenopathy.  Neurological: She is alert and oriented to person, place, and time.  Skin: Skin is warm and dry. No rash noted. No erythema.  Psychiatric: She has a normal mood and affect. Her behavior is normal. Judgment and thought content normal.  Vitals reviewed.    Lab Results  Component Value Date   WBC 4.5 08/01/2017   HGB 13.3 08/01/2017   HCT 39.0 08/01/2017   MCV 86.9 08/01/2017   PLT 160 08/01/2017     Chemistry      Component Value Date/Time   NA 143 08/01/2017 1528   NA 146 (H) 06/11/2017 1614   NA 146 (H) 01/25/2017 1448   NA 142 07/31/2015 1203   K 4.2 08/01/2017 1528   K 4.0 01/25/2017 1448   K 5.1 07/31/2015 1203   CL 107 08/01/2017 1528   CL 108 01/25/2017 1448   CO2 22 08/01/2017 1515   CO2  29 01/25/2017 1448   CO2 23 07/31/2015 1203   BUN 15 08/01/2017 1528   BUN 13 06/11/2017 1614   BUN 18 01/25/2017 1448   BUN 23.3 07/31/2015 1203   CREATININE 0.80 08/01/2017 1528   CREATININE 1.10 07/28/2017 1413   CREATININE 1.2 01/25/2017 1448   CREATININE 1.1 07/31/2015 1203      Component Value Date/Time   CALCIUM 9.1 08/01/2017 1515   CALCIUM 9.3 01/25/2017 1448   CALCIUM 9.3 07/31/2015 1203   ALKPHOS 98 (H) 07/28/2017 1413   ALKPHOS  64 01/25/2017 1448   ALKPHOS 57 07/31/2015 1203   AST 27 07/28/2017 1413   AST 18 07/31/2015 1203   ALT 21 07/28/2017 1413   ALT 20 01/25/2017 1448   ALT 15 07/31/2015 1203   BILITOT 2.3 (H) 07/28/2017 1413   BILITOT 0.96 07/31/2015 1203         Impression and Plan: Ms. Borchardt is a 84 year old white female.  She has metastatic breast cancer.  It seems as if her disease is still confined to the bones.  I really believe that the combination of Aromasin and Verzenio would be the best way to treat her.  I do not think we need to utilize Faslodex.  I would have to believe that the biology of her cancer really has not changed much.  I really do not think that her disease is aggressive yet.  I will start her on Verzenio at 100 mg twice a day.  We will see how she tolerates this.   she will agreed to re-start the Xgeva next month.    I would not scan her until she has had 3 months of therapy.  I spent about 45 minutes with she and her granddaughter.  Over 50% of the time was spent face-to-face with them.  I gave him information sheets about the Aromasin and Verzenio.  I will see them back in 1 month.     Volanda Napoleon, MD 3/20/20196:19 PM

## 2017-08-26 ENCOUNTER — Encounter: Payer: Self-pay | Admitting: Pharmacist

## 2017-08-26 ENCOUNTER — Other Ambulatory Visit: Payer: Self-pay | Admitting: *Deleted

## 2017-08-26 ENCOUNTER — Ambulatory Visit (INDEPENDENT_AMBULATORY_CARE_PROVIDER_SITE_OTHER): Payer: Medicare Other | Admitting: Pharmacist

## 2017-08-26 ENCOUNTER — Telehealth: Payer: Self-pay | Admitting: Hematology & Oncology

## 2017-08-26 ENCOUNTER — Telehealth: Payer: Self-pay | Admitting: Pharmacist

## 2017-08-26 DIAGNOSIS — I4891 Unspecified atrial fibrillation: Secondary | ICD-10-CM

## 2017-08-26 DIAGNOSIS — Z7901 Long term (current) use of anticoagulants: Secondary | ICD-10-CM | POA: Diagnosis not present

## 2017-08-26 LAB — POCT INR: INR: 2.5

## 2017-08-26 MED ORDER — ABEMACICLIB 100 MG PO TABS: 100.0000 mg | ORAL_TABLET | Freq: Two times a day (BID) | ORAL | 12 refills | Status: DC

## 2017-08-26 NOTE — Telephone Encounter (Signed)
Oral Oncology Patient Advocate Encounter  Also called granddaughter Amy White and left her a message to call us about Amy White. We may need to talk to her so she can help Amy White get all the information we need for her application.   [place] Patient Advocate [phone] 08/26/2017 11:32 AM

## 2017-08-26 NOTE — Telephone Encounter (Addendum)
Oral Oncology Patient Advocate Encounter  Left a message for patient to let her know about her co-pay $2381.89 Verzenio. And to let her know we are able to use a voucher for her first prescription for her Verzenio. When she calls back I will get her income information so we can try to get her manufacture assistance.   [place]  OP BIN: [id number] PCN: [id number] Group# [id number] ID# : [id number] Expires 06/07/2018  Added information to [facility] Patient Advocate [phone] 08/26/2017 10:45 AM

## 2017-08-26 NOTE — Telephone Encounter (Signed)
Oral Oncology Pharmacist Encounter  Received new prescription for Verzenio (abemaciclib) for the treatment of metastatic breast cancer in conjunction with exemestane, planned duration until disease progression or unacceptable drug toxicity.  CMP from 07/28/17 and CBC from 08/01/17 assessed, no relevant lab abnormalities. Prescription dose and frequency assessed.   Current medication list in Epic reviewed, no DDIs with abemaciclib identified.  Prescription has been e-scribed to the [facility] for benefits analysis and approval.  Oral Oncology Clinic will continue to follow for insurance authorization, copayment issues, initial counseling and start date.  Darl Pikes, PharmD, BCPS Hematology/Oncology Clinical Pharmacist [facility] [phone]  08/26/2017 9:28 AM

## 2017-08-26 NOTE — Telephone Encounter (Signed)
Erroneous Encounter

## 2017-08-27 MED FILL — VERZENIO 100 MG TAB: 100 | 28 days supply | Qty: 56 | Fill #0

## 2017-08-27 NOTE — Telephone Encounter (Signed)
Oral Chemotherapy Pharmacist Encounter  Patient Education I spoke with patient for overview of new oral chemotherapy medication: Verzenio (abemaciclib) for the treatment of metastatic breast cancer, planned duration until disease progression or unacceptable drug toxicity.   Pt is doing well. Counseled patient on administration, dosing, side effects, monitoring, drug-food interactions, safe handling, storage, and disposal. Patient will take 1 tablet (100 mg total) by mouth 2 (two) times daily.  Side effects include but not limited to: diarrhea, fatigue, N/V. Instructed the patient to pick up some loperamide so that she has it handy for diarrhea once she starts the medication.   Reviewed with patient importance of keeping a medication schedule and plan for any missed doses.  Ms. Alia voiced understanding and appreciation. Medication handout was placed in the mail. All questions answered.  Provided patient with [facility] phone number. Patient knows to call the office with questions or concerns. Oral Chemotherapy Navigation Clinic will continue to follow.  Darl Pikes, PharmD, BCPS Hematology/Oncology Clinical Pharmacist [facility] [phone]  08/27/2017 2:12 PM

## 2017-08-27 NOTE — Telephone Encounter (Signed)
Oral Oncology Patient Advocate Encounter  Sent E-mail to [facility] to please mail patients Verzenio to her home. She has a one moth voucher for free drug.  Working on Producer, television/film/video. Mailed to patient to mail back to [place].   [facility] Patient Advocate [phone] 08/27/2017 2:20 PM

## 2017-08-28 ENCOUNTER — Other Ambulatory Visit: Payer: Self-pay | Admitting: Cardiovascular Disease

## 2017-08-30 ENCOUNTER — Telehealth: Payer: Self-pay | Admitting: Hematology & Oncology

## 2017-08-30 ENCOUNTER — Other Ambulatory Visit: Payer: Medicare Other

## 2017-08-30 ENCOUNTER — Ambulatory Visit: Payer: Medicare Other | Admitting: Hematology & Oncology

## 2017-08-30 NOTE — Telephone Encounter (Signed)
Oral Oncology Patient Advocate Encounter  Was successful in securing patient an $ 8,000.00  grant from [facility] [place] (PAF) to provide copayment coverage for her Verzenio.  This will keep the out of pocket expense at $0.    I have spoken with the patient.    The billing information is as follows and has been shared with [place].   Member ID: [id number] Group ID: [id number] RxBin: [id number] PCN: [id number] Dates of Eligibility: 03/03/2017 through 08/31/2018

## 2017-08-30 NOTE — Telephone Encounter (Signed)
Oral Oncology Patient Advocate Encounter  Patients Verzenio was shipped 08/27/2017 from [facility].   [place] Patient Advocate [phone] 08/30/2017 8:37 AM

## 2017-09-10 ENCOUNTER — Other Ambulatory Visit: Payer: Self-pay | Admitting: *Deleted

## 2017-09-10 ENCOUNTER — Telehealth: Payer: Self-pay | Admitting: *Deleted

## 2017-09-10 MED ORDER — DIPHENOXYLATE-ATROPINE 2.5-0.025 MG PO TABS: ORAL_TABLET | ORAL | 1 refills | Status: DC

## 2017-09-10 NOTE — Telephone Encounter (Signed)
Received call from [place]. Patient is c/o loose stools. She has recently started Verzenio and was told to contact the office with any symptoms.  Dr Marin Olp wants patient to start lomotil. Will send in prescription. Pharmacy confirmed. Patient aware of new prescription.   Patient is also lacking follow up appointment. Message sent to scheduler to have appointment scheduled.

## 2017-09-13 ENCOUNTER — Telehealth: Payer: Self-pay | Admitting: Pharmacist

## 2017-09-13 ENCOUNTER — Other Ambulatory Visit: Payer: Self-pay | Admitting: Hematology & Oncology

## 2017-09-13 DIAGNOSIS — E559 Vitamin D deficiency, unspecified: Secondary | ICD-10-CM

## 2017-09-13 NOTE — Telephone Encounter (Addendum)
Oral Chemotherapy Pharmacist Encounter  Successfully enrolled patient for copayment assistance funds from [facility] from the Breast Cancer fund. Award amount: $15,000 Effective dates: 08/14/2017 - 08/14/2018 ID: [id number] BIN: [id number] Group: [id number] PCN: [id number]  [employer] ID [id number] Reference Number [id number]  I will place a copy of the award letter to be scanned into patient's chart. Information shared with [place].  Attempted to call patient and let her know about the additional grant, no answer LVM for patient to return my call.  Darl Pikes, PharmD, BCPS Hematology/Oncology Clinical Pharmacist [facility] [phone]  09/13/2017 9:56 AM

## 2017-09-14 NOTE — Telephone Encounter (Signed)
Oral Chemotherapy Pharmacist Encounter   Called patient and let her know about the [place].    Darl Pikes, PharmD, BCPS Hematology/Oncology Clinical Pharmacist [facility] [phone]  09/14/2017 10:55 AM

## 2017-09-16 DIAGNOSIS — Z6826 Body mass index (BMI) 26.0-26.9, adult: Secondary | ICD-10-CM | POA: Diagnosis not present

## 2017-09-16 DIAGNOSIS — S32020A Wedge compression fracture of second lumbar vertebra, initial encounter for closed fracture: Secondary | ICD-10-CM | POA: Diagnosis not present

## 2017-09-22 ENCOUNTER — Telehealth: Payer: Self-pay | Admitting: Pharmacist

## 2017-09-22 ENCOUNTER — Encounter: Payer: Self-pay | Admitting: Pharmacist

## 2017-09-22 NOTE — Telephone Encounter (Signed)
Erroneous encounter

## 2017-09-22 NOTE — Telephone Encounter (Signed)
Oral Chemotherapy Pharmacist Encounter   Was informed by the [place] Call center that this patient reported black stools.  I reached out to the patient for further information. These black stools are not new, she reports having having black stools since February. I asked if she has mentioned the dark stools to any healthcare provider, she stated that she has a home health nurses that comes out and she has mentioned the stools to them and to a "[facility]" physician that does home visits for her.   Because this is not a new/acute issue and she has an appointment with Dr. Marin Olp tomorrow, I instructed her to mention she stools at her visit.    Darl Pikes, PharmD, BCPS Hematology/Oncology Clinical Pharmacist [facility] [phone]  09/22/2017 12:30 PM

## 2017-09-22 NOTE — Telephone Encounter (Signed)
Oral Chemotherapy Pharmacist Encounter   PAF physician diagnosis verification form faxed to PAF on 09/20/17. Called today to confirm receipt. Fax was received on 09/21/17. This was the last step needed to secure continued access to PAF funding.  Darl Pikes, PharmD, BCPS Hematology/Oncology Clinical Pharmacist [facility] [phone]  09/22/2017 9:59 AM

## 2017-09-23 ENCOUNTER — Inpatient Hospital Stay: Payer: Medicare Other | Attending: Hematology & Oncology | Admitting: Hematology & Oncology

## 2017-09-23 ENCOUNTER — Other Ambulatory Visit: Payer: Self-pay

## 2017-09-23 ENCOUNTER — Inpatient Hospital Stay: Payer: Medicare Other

## 2017-09-23 VITALS — BP 98/52 | HR 84 | Temp 97.6°F | Resp 17 | Wt 159.8 lb

## 2017-09-23 DIAGNOSIS — C50912 Malignant neoplasm of unspecified site of left female breast: Secondary | ICD-10-CM

## 2017-09-23 DIAGNOSIS — C50919 Malignant neoplasm of unspecified site of unspecified female breast: Secondary | ICD-10-CM

## 2017-09-23 DIAGNOSIS — M791 Myalgia, unspecified site: Secondary | ICD-10-CM

## 2017-09-23 DIAGNOSIS — K921 Melena: Secondary | ICD-10-CM | POA: Insufficient documentation

## 2017-09-23 DIAGNOSIS — I4891 Unspecified atrial fibrillation: Secondary | ICD-10-CM | POA: Diagnosis not present

## 2017-09-23 DIAGNOSIS — C7951 Secondary malignant neoplasm of bone: Secondary | ICD-10-CM | POA: Diagnosis not present

## 2017-09-23 DIAGNOSIS — C50911 Malignant neoplasm of unspecified site of right female breast: Secondary | ICD-10-CM | POA: Diagnosis not present

## 2017-09-23 DIAGNOSIS — Z170 Estrogen receptor positive status [ER+]: Secondary | ICD-10-CM | POA: Diagnosis not present

## 2017-09-23 DIAGNOSIS — Z7901 Long term (current) use of anticoagulants: Secondary | ICD-10-CM

## 2017-09-23 DIAGNOSIS — Z79899 Other long term (current) drug therapy: Secondary | ICD-10-CM | POA: Diagnosis not present

## 2017-09-23 DIAGNOSIS — Z79811 Long term (current) use of aromatase inhibitors: Secondary | ICD-10-CM | POA: Diagnosis not present

## 2017-09-23 DIAGNOSIS — M549 Dorsalgia, unspecified: Secondary | ICD-10-CM | POA: Diagnosis not present

## 2017-09-23 LAB — CBC WITH DIFFERENTIAL (CANCER CENTER ONLY)
Basophils Absolute: 0.1 10*3/uL (ref 0.0–0.1)
Basophils Relative: 2 %
Eosinophils Absolute: 0.1 10*3/uL (ref 0.0–0.5)
Eosinophils Relative: 2 %
HCT: 41.8 % (ref 34.8–46.6)
Hemoglobin: 14 g/dL (ref 11.6–15.9)
Lymphocytes Relative: 49 %
Lymphs Abs: 1.5 10*3/uL (ref 0.9–3.3)
MCH: 29.5 pg (ref 26.0–34.0)
MCHC: 33.5 g/dL (ref 32.0–36.0)
MCV: 88.2 fL (ref 81.0–101.0)
Monocytes Absolute: 0.1 10*3/uL (ref 0.1–0.9)
Monocytes Relative: 4 %
Neutro Abs: 1.3 10*3/uL — ABNORMAL LOW (ref 1.5–6.5)
Neutrophils Relative %: 43 %
Platelet Count: 88 10*3/uL — ABNORMAL LOW (ref 145–400)
RBC: 4.74 MIL/uL (ref 3.70–5.32)
RDW: 15.6 % (ref 11.1–15.7)
WBC Count: 3 10*3/uL — ABNORMAL LOW (ref 3.9–10.0)

## 2017-09-23 LAB — CMP (CANCER CENTER ONLY)
ALT: 26 U/L (ref 10–47)
AST: 32 U/L (ref 11–38)
Albumin: 4.3 g/dL (ref 3.5–5.0)
Alkaline Phosphatase: 72 U/L (ref 26–84)
Anion gap: 6 (ref 5–15)
BUN: 20 mg/dL (ref 7–22)
CO2: 29 mmol/L (ref 18–33)
Calcium: 9.8 mg/dL (ref 8.0–10.3)
Chloride: 109 mmol/L — ABNORMAL HIGH (ref 98–108)
Creatinine: 1.8 mg/dL — ABNORMAL HIGH (ref 0.60–1.20)
Glucose, Bld: 94 mg/dL (ref 73–118)
Potassium: 4.4 mmol/L (ref 3.3–4.7)
Sodium: 144 mmol/L (ref 128–145)
Total Bilirubin: 2 mg/dL — ABNORMAL HIGH (ref 0.2–1.6)
Total Protein: 7 g/dL (ref 6.4–8.1)

## 2017-09-23 MED ORDER — PANTOPRAZOLE SODIUM 40 MG PO TBEC: 40.0000 mg | DELAYED_RELEASE_TABLET | Freq: Every day | ORAL | 4 refills | Status: DC

## 2017-09-23 MED ORDER — DENOSUMAB 120 MG/1.7ML ~~LOC~~ SOLN
SUBCUTANEOUS | Status: AC
  Filled 2017-09-23: qty 1.7

## 2017-09-23 MED ORDER — DENOSUMAB 120 MG/1.7ML ~~LOC~~ SOLN
120.0000 mg | Freq: Once | SUBCUTANEOUS | Status: AC
  Administered 2017-09-23: 120 mg via SUBCUTANEOUS

## 2017-09-23 MED FILL — VERZENIO 100 MG TAB: 100 | 28 days supply | Qty: 56 | Fill #1

## 2017-09-23 NOTE — Patient Instructions (Signed)

## 2017-09-23 NOTE — Progress Notes (Signed)
Hematology and Oncology Follow Up Visit  KAITRIN SEYBOLD [id number] Jul 25, 1925 84 y.o. 09/23/2017   Principle Diagnosis:   Metastatic breast cancer-bone only metastasis  Current Therapy:    Aromasin 25 mg p.o. Daily  Versenio 100 mg po BID   Xgeva 120 mg sq q 3 months - next dose 12/2017      Interim History:  Ms. Iverson is back for follow-up.  She is not having a good time with the back brace that she has on.  She has 2 fractures from where she fell.  We now have her on Aromasin and Versenio.  She has not been taking the Aromasin because she was not aware that this was at the pharmacy.  I told her that it would really help if she started taking the Aromasin.  She has had some loose stools with the Versenio.  She says that her stools are dark black.  I did do a rectal exam on her.  Her stool was brown and faintly heme positive.  She is on Coumadin for atrial fibrillation.  I went ahead and put her on Protonix (40 mg p.o. daily) to try to help.  She is not anemic at all so I do not think we have to get her to gastroenterology right now.  Otherwise, she seems to be holding her own.  She is eating okay.  She is having no nausea or vomiting.  She has had no rashes.  There is been no leg swelling.  Overall, her performance status is ECOG 2.  Medications:  Current Outpatient Medications:  .  abemaciclib (VERZENIO) 100 MG tablet, Take 1 tablet (100 mg total) by mouth 2 (two) times daily. Swallow tablets whole. Do not chew, crush, or split tablets before swallowing., Disp: 60 tablet, Rfl: 12 .  albuterol (PROVENTIL HFA;VENTOLIN HFA) 108 (90 BASE) MCG/ACT inhaler, Inhale 2 puffs into the lungs every 6 (six) hours as needed for wheezing or shortness of breath., Disp: , Rfl:  .  ALPRAZolam (XANAX) 0.5 MG tablet, Take 0.25 mg by mouth 2 (two) times daily as needed for anxiety. , Disp: , Rfl: 0 .  bisoprolol (ZEBETA) 5 MG tablet, Take 2 tablets (10 mg total) by mouth daily., Disp: 60 tablet,  Rfl: 5 .  diphenoxylate-atropine (LOMOTIL) 2.5-0.025 MG tablet, Take one tablet after each loose stool. Maximum of 8 tablets per day., Disp: 30 tablet, Rfl: 1 .  exemestane (AROMASIN) 25 MG tablet, Take 1 tablet (25 mg total) by mouth daily after breakfast. (Patient not taking: Reported on 09/23/2017), Disp: 30 tablet, Rfl: 12 .  levothyroxine (SYNTHROID, LEVOTHROID) 100 MCG tablet, Take 1 tablet daily by mouth., Disp: , Rfl: 3 .  oxyCODONE-acetaminophen (PERCOCET/ROXICET) 5-325 MG tablet, TK 1 T PO Q 4 H PRN P, Disp: , Rfl: 0 .  tetrahydrozoline-zinc (VISINE-AC) 0.05-0.25 % ophthalmic solution, Place 2 drops into both eyes 3 (three) times daily as needed (red eyes)., Disp: , Rfl:  .  Vitamin D, Ergocalciferol, (DRISDOL) 50000 units CAPS capsule, TAKE 1 CAPSULE BY MOUTH 1 TIME A WEEK, Disp: 4 capsule, Rfl: 0 .  warfarin (COUMADIN) 5 MG tablet, TAKE ONE-HALF TO 1 TABLET BY MOUTH DAILY AS DIRECTED BY COUMADIN CLINIC, Disp: 90 tablet, Rfl: 1  Allergies:  Allergies  Allergen Reactions  . Morphine And Related Rash  . Crestor [Rosuvastatin]   . Ciprofloxacin Rash    Rash in the mouth  . Penicillins Hives and Rash  . Vancomycin Rash    "Red-man"  Past Medical History, Surgical history, Social history, and Family History were reviewed and updated.  Review of Systems: Review of Systems  Constitutional: Negative.   HENT:  Negative.   Eyes: Negative.   Respiratory: Negative.   Cardiovascular: Negative.   Gastrointestinal: Negative.   Endocrine: Negative.   Genitourinary: Negative.    Musculoskeletal: Positive for back pain and myalgias.  Skin: Negative.   Neurological: Negative.   Hematological: Negative.   Psychiatric/Behavioral: Negative.     Physical Exam:  weight is 159 lb 12.8 oz (72.5 kg). Her oral temperature is 97.6 F (36.4 C). Her blood pressure is 98/52 (abnormal) and her pulse is 84. Her respiration is 17 and oxygen saturation is 96%.   Wt Readings from Last 3  Encounters:  09/23/17 159 lb 12.8 oz (72.5 kg)  08/25/17 169 lb (76.7 kg)  07/28/17 170 lb (77.1 kg)    Physical Exam  Constitutional: She is oriented to person, place, and time.  HENT:  Head: Normocephalic and atraumatic.  Mouth/Throat: Oropharynx is clear and moist.  Eyes: Pupils are equal, round, and reactive to light. EOM are normal.  Neck: Normal range of motion.  Cardiovascular: Normal rate, regular rhythm and normal heart sounds.  Pulmonary/Chest: Effort normal and breath sounds normal.  Abdominal: Soft. Bowel sounds are normal.  Musculoskeletal: Normal range of motion. She exhibits no edema, tenderness or deformity.  Lymphadenopathy:    She has no cervical adenopathy.  Neurological: She is alert and oriented to person, place, and time.  Skin: Skin is warm and dry. No rash noted. No erythema.  Psychiatric: She has a normal mood and affect. Her behavior is normal. Judgment and thought content normal.  Vitals reviewed.    Lab Results  Component Value Date   WBC 3.0 (L) 09/23/2017   HGB 13.3 08/01/2017   HCT 41.8 09/23/2017   MCV 88.2 09/23/2017   PLT 88 (L) 09/23/2017     Chemistry      Component Value Date/Time   NA 144 09/23/2017 1426   NA 146 (H) 06/11/2017 1614   NA 146 (H) 01/25/2017 1448   NA 142 07/31/2015 1203   K 4.4 09/23/2017 1426   K 4.0 01/25/2017 1448   K 5.1 07/31/2015 1203   CL 109 (H) 09/23/2017 1426   CL 108 01/25/2017 1448   CO2 29 09/23/2017 1426   CO2 29 01/25/2017 1448   CO2 23 07/31/2015 1203   BUN 20 09/23/2017 1426   BUN 13 06/11/2017 1614   BUN 18 01/25/2017 1448   BUN 23.3 07/31/2015 1203   CREATININE 1.80 (H) 09/23/2017 1426   CREATININE 1.2 01/25/2017 1448   CREATININE 1.1 07/31/2015 1203      Component Value Date/Time   CALCIUM 9.8 09/23/2017 1426   CALCIUM 9.3 01/25/2017 1448   CALCIUM 9.3 07/31/2015 1203   ALKPHOS 72 09/23/2017 1426   ALKPHOS 64 01/25/2017 1448   ALKPHOS 57 07/31/2015 1203   AST 32 09/23/2017 1426    AST 18 07/31/2015 1203   ALT 26 09/23/2017 1426   ALT 20 01/25/2017 1448   ALT 15 07/31/2015 1203   BILITOT 2.0 (H) 09/23/2017 1426   BILITOT 0.96 07/31/2015 1203         Impression and Plan: Ms. Crocker is a 84 year old white female.  She has metastatic breast cancer.  It seems as if her disease is still confined to the bones.  I really believe that the combination of Aromasin and Verzenio would be the best way to  treat her.  I do not think we need to utilize Faslodex.  We will see what her CA 27.29 is.  I would like to see her back in 1 month.  She will get her Delton See today.   I would like to think that we will get "a lot of mileage" out of the Aromasin/Versenio combination.  She has been on antiestrogen therapy now for 13 years for her metastatic disease.   Volanda Napoleon, MD 4/18/20193:02 PM

## 2017-09-24 ENCOUNTER — Telehealth: Payer: Self-pay | Admitting: Cardiovascular Disease

## 2017-09-24 DIAGNOSIS — I4891 Unspecified atrial fibrillation: Secondary | ICD-10-CM

## 2017-09-24 DIAGNOSIS — Z79899 Other long term (current) drug therapy: Secondary | ICD-10-CM

## 2017-09-24 LAB — CANCER ANTIGEN 27.29: CA 27.29: 53.6 U/mL — ABNORMAL HIGH (ref 0.0–38.6)

## 2017-09-24 NOTE — Telephone Encounter (Signed)
I think it is a great idea to refer for home health nursing.  Please do so. Please ask her to do a download on her pacemaker so we can see whether she is in atrial fibrillation.  Based on that and on her heart rate, we can decide whether we can reduce the dose of bisoprolol.  I would encourage her to eat a little salt and drink extra fluids over the next couple of days. MCr

## 2017-09-24 NOTE — Telephone Encounter (Signed)
Returned call to [facility] OT who is concerned about patient's low BP of 90/64. She states this is lower than normal for patient but she only sees her once/week. Patient denies dizziness but does c/o tiredness. [name] does not think patient has a BP cuff. Patient lives alone. Patient was wondering what she should do about her bisoprolol which she generally takes in the afternoon.   Also, [name] feels that patient is appropriate for home health nursing care and she tried to reach out to PCP but their office is closed. She states patient is complex and needs assistance with medication management.   Advised her that I would reach out to patient and also notify MD  Called patient. She reports she has BP cuff at home but does not check routinely. Suggested she check at least daily since she has been having low BPs. Her BP was 98 systolic at her cancer doctor yesterday, MD also told her that her heart sounded like it was all over the place. She has decreased bisoprolol to just 1 tablet (5mg ) daily a few weeks ago d/t low BP  Will route to MD for recommendations

## 2017-09-24 NOTE — Telephone Encounter (Signed)
Pt c/o BP issue: STAT if pt c/o blurred vision, one-sided weakness or slurred speech  1. What are your last 5 BP readings? 90/64     2. Are you having any other symptoms (ex. Dizziness, headache, blurred vision, passed out)?  Pt said she was very tired  3. What is your BP issue?  Pt did not  take her bp medication yet, PT is unsure if she should take the bp medication yet

## 2017-09-28 NOTE — Telephone Encounter (Signed)
Returned call to patient. Provided MD suggestion of sending in remote check of pacemaker. She states she ha been in AF for 6-8 months. She stopped bisoprolol 5mg  last Friday (was in 10mg  but she reduced to 5mg  herself prior to 4/19 phone call). She reports her BP has consistently been under 780 systolic even after stopping this med.   Yesterday BP 92/66 HR in the 80s She reports only one systolic reading greater than 100  Advised to send in transmission today to have rhythm and HR evaluated to guide next steps for medications if needed.   Message routed to device clinic, MD, CMA for follow up

## 2017-09-28 NOTE — Telephone Encounter (Addendum)
Patient transmission reviewed. Presenting rhythm AF with RVR.  100% AT/AF burden + coumadin.

## 2017-09-29 ENCOUNTER — Telehealth: Payer: Self-pay | Admitting: Cardiovascular Disease

## 2017-09-29 NOTE — Telephone Encounter (Signed)
Amy White/[facility] calling to report pt's BP of 100/60 and 90/62 on machine. Please advise.

## 2017-09-29 NOTE — Telephone Encounter (Signed)
Spoke with pt, she will see the atrial fib clinic tomorrow at 1:30 pm. Directions and parking code given to the patient.

## 2017-09-29 NOTE — Telephone Encounter (Signed)
Left message for Amy White to call. Spoke with pt, she feels fine, just tired. She denies dizziness or other orthostatic symptoms. Her pulse rate is 117 bpm and has been running 121-84 bpm over the last couple days. Her device transmission shows atrial fib with RVR. She has been off bisoprolol since Friday. Left message for the atrial fib clinic to call about seeing the patient.

## 2017-09-30 ENCOUNTER — Encounter (HOSPITAL_COMMUNITY): Payer: Self-pay | Admitting: Nurse Practitioner

## 2017-09-30 ENCOUNTER — Ambulatory Visit (HOSPITAL_COMMUNITY)
Admission: RE | Admit: 2017-09-30 | Discharge: 2017-09-30 | Disposition: A | Discharge status: Home / Self Care | Payer: Medicare Other | Source: Ambulatory Visit | Attending: Nurse Practitioner | Admitting: Nurse Practitioner

## 2017-09-30 ENCOUNTER — Other Ambulatory Visit: Payer: Self-pay

## 2017-09-30 VITALS — BP 112/64 | HR 103 | Ht 67.0 in | Wt 162.0 lb

## 2017-09-30 DIAGNOSIS — C7951 Secondary malignant neoplasm of bone: Secondary | ICD-10-CM | POA: Diagnosis not present

## 2017-09-30 DIAGNOSIS — K219 Gastro-esophageal reflux disease without esophagitis: Secondary | ICD-10-CM | POA: Diagnosis not present

## 2017-09-30 DIAGNOSIS — F419 Anxiety disorder, unspecified: Secondary | ICD-10-CM | POA: Diagnosis not present

## 2017-09-30 DIAGNOSIS — Z79899 Other long term (current) drug therapy: Secondary | ICD-10-CM | POA: Diagnosis not present

## 2017-09-30 DIAGNOSIS — I10 Essential (primary) hypertension: Secondary | ICD-10-CM | POA: Diagnosis not present

## 2017-09-30 DIAGNOSIS — Z9221 Personal history of antineoplastic chemotherapy: Secondary | ICD-10-CM | POA: Insufficient documentation

## 2017-09-30 DIAGNOSIS — I481 Persistent atrial fibrillation: Secondary | ICD-10-CM

## 2017-09-30 DIAGNOSIS — Z885 Allergy status to narcotic agent status: Secondary | ICD-10-CM | POA: Insufficient documentation

## 2017-09-30 DIAGNOSIS — K449 Diaphragmatic hernia without obstruction or gangrene: Secondary | ICD-10-CM | POA: Diagnosis not present

## 2017-09-30 DIAGNOSIS — I4819 Other persistent atrial fibrillation: Secondary | ICD-10-CM

## 2017-09-30 DIAGNOSIS — Z79890 Hormone replacement therapy (postmenopausal): Secondary | ICD-10-CM | POA: Insufficient documentation

## 2017-09-30 DIAGNOSIS — E0590 Thyrotoxicosis, unspecified without thyrotoxic crisis or storm: Secondary | ICD-10-CM | POA: Insufficient documentation

## 2017-09-30 DIAGNOSIS — Z880 Allergy status to penicillin: Secondary | ICD-10-CM | POA: Diagnosis not present

## 2017-09-30 DIAGNOSIS — Z950 Presence of cardiac pacemaker: Secondary | ICD-10-CM | POA: Insufficient documentation

## 2017-09-30 DIAGNOSIS — J449 Chronic obstructive pulmonary disease, unspecified: Secondary | ICD-10-CM | POA: Diagnosis not present

## 2017-09-30 DIAGNOSIS — Z7901 Long term (current) use of anticoagulants: Secondary | ICD-10-CM | POA: Insufficient documentation

## 2017-09-30 DIAGNOSIS — Z853 Personal history of malignant neoplasm of breast: Secondary | ICD-10-CM | POA: Insufficient documentation

## 2017-09-30 DIAGNOSIS — Z881 Allergy status to other antibiotic agents status: Secondary | ICD-10-CM | POA: Diagnosis not present
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
## 2017-10-01 ENCOUNTER — Telehealth: Payer: Self-pay

## 2017-10-01 NOTE — Telephone Encounter (Signed)
Phone call placed to patient to offer to schedule visit with Palliative Care. Visit scheduled for Monday 10/04/17

## 2017-10-01 NOTE — Progress Notes (Signed)
Thank you, Donna! MCr 

## 2017-10-01 NOTE — Telephone Encounter (Signed)
She saw Roderic Palau yesterday. Only mild RVR at rest and low BP is partly related to poor PO intake and chemo complications. Agree that she needs to stay off bisoprolol. I cannot tell from the device clinic notes if her ventricular rate is generally well controlled or not. Kristen, please have the device interrogation scanned in.

## 2017-10-04 ENCOUNTER — Other Ambulatory Visit: Payer: Self-pay | Admitting: Hospice and Palliative Medicine

## 2017-10-04 DIAGNOSIS — Z515 Encounter for palliative care: Secondary | ICD-10-CM

## 2017-10-04 NOTE — Progress Notes (Signed)
PALLIATIVE CARE CONSULT VISIT   PATIENT NAME: Amy White DOB: May 13, 1926 MRN: [id number]  PRIMARY CARE PROVIDER: Mayra Neer, MD  REFERRING PROVIDER: Mayra Neer, MD [address] [place], [place] [address]  RESPONSIBLE PARTY:   self   RECOMMENDATIONS and PLAN:  1. Weight loss - patient report losing ~20lbs since February 2019. She admits to not having much of an appetite. I encouraged PO supplements BID and discussed the importance of calorie rich foods. I suspect she is also not drinking much and encouraged her to increase po fluids.  2. Weakness - uses a walker to ambulate around the home. No recent falls. She says she is able to independently care for herself in the home but is limited by fatigue and occasional dyspnea requiring frequent breaks. 3. Numbness - says she woke this AM with some mild tingling in L. 5th digit. No weakness or overuse. Has extensive spinal dz from compression fractures to the lumbar and thoracic area with mets. Should numbness persist or develops UE weakness, would likely benefit from cervical imaging.  4. Pain - stable on prn percocet 5. SOCIAL - lives at home alone. She is widowed. Had a son who died in Sep 12, 2008. Has a granddaughter who is her next-of-kin, but is reportedly not very involved in her day-to-day care. Patient is interested in home resources including Meals on Wheels. Will ask our SW to make a visit.  6. GOC - patient currently plans to continue treatment for the cancer. She is interested in completing advance directives, as she does not have either a HCPOA or a living will. She would want her granddaughter to be her medical decision maker if needed. We discussed code status and patient would be interested in an attempt at resuscitation. However, she says she would only want to be supported artificially for a day or two and then removed from a ventilator. I explained that resuscitation would be unlikely to return her to her  existing quality of life. She would like to speak with oncology more about decision making.   I spent 60 minutes providing this consultation,  from 1300 to 1400. More than 50% of the time in this consultation was spent coordinating communication.   HISTORY OF PRESENT ILLNESS:  Amy White is a 84 y.o. year old female with multiple medical problems including stage IV breast cancer metastatic to bone, afib on Coumadin, SSS s/p PPM, COPD not on O2, and HTN. Palliative Care was asked to help address goals of care. Routine visit made today in patient's home.   CODE STATUS: FULL CODE  PPS: 40% HOSPICE ELIGIBILITY/DIAGNOSIS: TBD  PAST MEDICAL HISTORY:  Past Medical History:  Diagnosis Date  . Anxiety   . Arthritis    "all over"  . Asthma   . Atrial fibrillation (HCC)    a. paroxysmal, on Coumadin for anticoagulation  . Breast cancer ([place]) 1997   "left"  . Breast cancer metastasized to bone ([place]) 05/13/2011  . COPD (chronic obstructive pulmonary disease) ([place])   . Dysrhythmia   . GERD (gastroesophageal reflux disease)   . H/O hiatal hernia   . Hypertension   . Hyperthyroidism   . Presence of permanent cardiac pacemaker   . Slow urinary stream   . SSS (sick sinus syndrome) ([place])    a. s/p PPM placement in 09/13/14.    SOCIAL HX:  Social History   Tobacco Use  . Smoking status: Never Smoker  . Smokeless tobacco: Never Used  .  Tobacco comment: NEVER USED TOBACCO  Substance Use Topics  . Alcohol use: Yes    Alcohol/week: 0.0 oz    Comment: 03/30/2014 "might have a drink once/month in the summertime"    ALLERGIES:  Allergies  Allergen Reactions  . Morphine And Related Rash  . Crestor [Rosuvastatin]   . Ciprofloxacin Rash    Rash in the mouth  . Penicillins Hives and Rash  . Vancomycin Rash    "Red-man"     PERTINENT MEDICATIONS:  Outpatient Encounter Medications as of 10/04/2017  Medication Sig  . abemaciclib (VERZENIO) 100 MG tablet Take 1 tablet (100 mg total) by mouth  2 (two) times daily. Swallow tablets whole. Do not chew, crush, or split tablets before swallowing.  . albuterol (PROVENTIL HFA;VENTOLIN HFA) 108 (90 BASE) MCG/ACT inhaler Inhale 2 puffs into the lungs every 6 (six) hours as needed for wheezing or shortness of breath.  . ALPRAZolam (XANAX) 0.5 MG tablet Take 0.25 mg by mouth 2 (two) times daily as needed for anxiety.   . diphenoxylate-atropine (LOMOTIL) 2.5-0.025 MG tablet Take one tablet after each loose stool. Maximum of 8 tablets per day.  . exemestane (AROMASIN) 25 MG tablet Take 1 tablet (25 mg total) by mouth daily after breakfast.  . levothyroxine (SYNTHROID, LEVOTHROID) 100 MCG tablet Take 1 tablet daily by mouth.  . oxyCODONE-acetaminophen (PERCOCET/ROXICET) 5-325 MG tablet TK 1 T PO Q 4 H PRN P  . pantoprazole (PROTONIX) 40 MG tablet Take 1 tablet (40 mg total) by mouth daily.  [place] Kitchen tetrahydrozoline-zinc (VISINE-AC) 0.05-0.25 % ophthalmic solution Place 2 drops into both eyes 3 (three) times daily as needed (red eyes).  . Vitamin D, Ergocalciferol, (DRISDOL) 50000 units CAPS capsule TAKE 1 CAPSULE BY MOUTH 1 TIME A WEEK  . warfarin (COUMADIN) 5 MG tablet TAKE ONE-HALF TO 1 TABLET BY MOUTH DAILY AS DIRECTED BY COUMADIN CLINIC   No facility-administered encounter medications on file as of 10/04/2017.     PHYSICAL EXAM:   General: NAD, frail appearing, thin Cardiovascular: pulse irregular Pulmonary: nonlabored Extremities: chronic trace edema L. Ankle, joint deformities of hands, wears back brace Skin: no rashes Neurological: Weakness but otherwise nonfocal  Irean Hong, NP

## 2017-10-06 ENCOUNTER — Encounter (HOSPITAL_COMMUNITY): Payer: Self-pay | Admitting: Nurse Practitioner

## 2017-10-06 ENCOUNTER — Other Ambulatory Visit: Payer: Self-pay

## 2017-10-06 ENCOUNTER — Ambulatory Visit (HOSPITAL_COMMUNITY)
Admission: RE | Admit: 2017-10-06 | Discharge: 2017-10-06 | Disposition: A | Discharge status: Home / Self Care | Payer: Medicare Other | Source: Ambulatory Visit | Attending: Nurse Practitioner | Admitting: Nurse Practitioner

## 2017-10-06 VITALS — BP 112/72 | HR 100 | Ht 67.0 in | Wt 163.0 lb

## 2017-10-06 DIAGNOSIS — Z7901 Long term (current) use of anticoagulants: Secondary | ICD-10-CM | POA: Diagnosis not present

## 2017-10-06 DIAGNOSIS — Z880 Allergy status to penicillin: Secondary | ICD-10-CM | POA: Insufficient documentation

## 2017-10-06 DIAGNOSIS — J449 Chronic obstructive pulmonary disease, unspecified: Secondary | ICD-10-CM | POA: Diagnosis not present

## 2017-10-06 DIAGNOSIS — Z9889 Other specified postprocedural states: Secondary | ICD-10-CM | POA: Diagnosis not present

## 2017-10-06 DIAGNOSIS — Z885 Allergy status to narcotic agent status: Secondary | ICD-10-CM | POA: Diagnosis not present

## 2017-10-06 DIAGNOSIS — F419 Anxiety disorder, unspecified: Secondary | ICD-10-CM | POA: Diagnosis not present

## 2017-10-06 DIAGNOSIS — Z9012 Acquired absence of left breast and nipple: Secondary | ICD-10-CM | POA: Diagnosis not present

## 2017-10-06 DIAGNOSIS — Z79899 Other long term (current) drug therapy: Secondary | ICD-10-CM | POA: Diagnosis not present

## 2017-10-06 DIAGNOSIS — Z881 Allergy status to other antibiotic agents status: Secondary | ICD-10-CM | POA: Insufficient documentation

## 2017-10-06 DIAGNOSIS — I4819 Other persistent atrial fibrillation: Secondary | ICD-10-CM

## 2017-10-06 DIAGNOSIS — I10 Essential (primary) hypertension: Secondary | ICD-10-CM | POA: Insufficient documentation

## 2017-10-06 DIAGNOSIS — I481 Persistent atrial fibrillation: Secondary | ICD-10-CM | POA: Insufficient documentation

## 2017-10-06 DIAGNOSIS — Z853 Personal history of malignant neoplasm of breast: Secondary | ICD-10-CM | POA: Insufficient documentation

## 2017-10-06 DIAGNOSIS — E0590 Thyrotoxicosis, unspecified without thyrotoxic crisis or storm: Secondary | ICD-10-CM | POA: Insufficient documentation

## 2017-10-06 DIAGNOSIS — Z950 Presence of cardiac pacemaker: Secondary | ICD-10-CM | POA: Diagnosis not present

## 2017-10-06 DIAGNOSIS — K219 Gastro-esophageal reflux disease without esophagitis: Secondary | ICD-10-CM | POA: Diagnosis not present
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
## 2017-10-07 DIAGNOSIS — C7951 Secondary malignant neoplasm of bone: Secondary | ICD-10-CM | POA: Diagnosis not present

## 2017-10-07 DIAGNOSIS — Z0000 Encounter for general adult medical examination without abnormal findings: Secondary | ICD-10-CM | POA: Diagnosis not present

## 2017-10-07 DIAGNOSIS — C50919 Malignant neoplasm of unspecified site of unspecified female breast: Secondary | ICD-10-CM | POA: Diagnosis not present

## 2017-10-07 DIAGNOSIS — N183 Chronic kidney disease, stage 3 (moderate): Secondary | ICD-10-CM | POA: Diagnosis not present

## 2017-10-12 ENCOUNTER — Ambulatory Visit (INDEPENDENT_AMBULATORY_CARE_PROVIDER_SITE_OTHER): Payer: Medicare Other | Admitting: *Deleted

## 2017-10-12 ENCOUNTER — Ambulatory Visit (INDEPENDENT_AMBULATORY_CARE_PROVIDER_SITE_OTHER): Payer: Medicare Other | Admitting: Pharmacist

## 2017-10-12 DIAGNOSIS — I4891 Unspecified atrial fibrillation: Secondary | ICD-10-CM

## 2017-10-12 DIAGNOSIS — I481 Persistent atrial fibrillation: Secondary | ICD-10-CM

## 2017-10-12 DIAGNOSIS — Z7901 Long term (current) use of anticoagulants: Secondary | ICD-10-CM

## 2017-10-12 DIAGNOSIS — I4819 Other persistent atrial fibrillation: Secondary | ICD-10-CM

## 2017-10-12 DIAGNOSIS — I495 Sick sinus syndrome: Secondary | ICD-10-CM

## 2017-10-12 LAB — POCT INR: INR: 7.7

## 2017-10-12 NOTE — Progress Notes (Signed)
Remote pacemaker transmission.   

## 2017-10-13 ENCOUNTER — Telehealth: Payer: Self-pay | Admitting: Internal Medicine

## 2017-10-13 LAB — PROTIME-INR
INR: 7.1 (ref 0.8–1.2)
Prothrombin Time: 66.1 s — ABNORMAL HIGH (ref 9.1–12.0)

## 2017-10-13 NOTE — Telephone Encounter (Signed)
Cardiology Moonlighter Note  Received page from [facility] regarding critical lab value. Patient's INR 7.1 from 10/12/17. Results transmitted to Dr. Debara Pickett and Dr. Sallyanne Kuster.  Marcie Mowers, MD Cardiology Fellow, PGY-5

## 2017-10-14 ENCOUNTER — Encounter: Payer: Self-pay | Admitting: Cardiology

## 2017-10-15 ENCOUNTER — Ambulatory Visit (INDEPENDENT_AMBULATORY_CARE_PROVIDER_SITE_OTHER): Payer: Medicare Other | Admitting: Pharmacist

## 2017-10-15 DIAGNOSIS — Z7901 Long term (current) use of anticoagulants: Secondary | ICD-10-CM

## 2017-10-15 DIAGNOSIS — I481 Persistent atrial fibrillation: Secondary | ICD-10-CM | POA: Diagnosis not present

## 2017-10-15 DIAGNOSIS — I4819 Other persistent atrial fibrillation: Secondary | ICD-10-CM

## 2017-10-15 LAB — POCT INR: INR: 1.8

## 2017-10-20 ENCOUNTER — Ambulatory Visit (INDEPENDENT_AMBULATORY_CARE_PROVIDER_SITE_OTHER): Payer: Medicare Other | Admitting: Pharmacist Clinician (PhC)/ Clinical Pharmacy Specialist

## 2017-10-20 DIAGNOSIS — C50919 Malignant neoplasm of unspecified site of unspecified female breast: Secondary | ICD-10-CM | POA: Diagnosis not present

## 2017-10-20 DIAGNOSIS — I129 Hypertensive chronic kidney disease with stage 1 through stage 4 chronic kidney disease, or unspecified chronic kidney disease: Secondary | ICD-10-CM | POA: Diagnosis not present

## 2017-10-20 DIAGNOSIS — I4819 Other persistent atrial fibrillation: Secondary | ICD-10-CM

## 2017-10-20 DIAGNOSIS — Z7901 Long term (current) use of anticoagulants: Secondary | ICD-10-CM | POA: Diagnosis not present

## 2017-10-20 DIAGNOSIS — E039 Hypothyroidism, unspecified: Secondary | ICD-10-CM | POA: Diagnosis not present

## 2017-10-20 DIAGNOSIS — I481 Persistent atrial fibrillation: Secondary | ICD-10-CM

## 2017-10-20 DIAGNOSIS — C7951 Secondary malignant neoplasm of bone: Secondary | ICD-10-CM | POA: Diagnosis not present

## 2017-10-20 DIAGNOSIS — K5790 Diverticulosis of intestine, part unspecified, without perforation or abscess without bleeding: Secondary | ICD-10-CM | POA: Diagnosis not present

## 2017-10-20 DIAGNOSIS — K219 Gastro-esophageal reflux disease without esophagitis: Secondary | ICD-10-CM | POA: Diagnosis not present

## 2017-10-20 DIAGNOSIS — N183 Chronic kidney disease, stage 3 (moderate): Secondary | ICD-10-CM | POA: Diagnosis not present

## 2017-10-20 DIAGNOSIS — Z9181 History of falling: Secondary | ICD-10-CM | POA: Diagnosis not present

## 2017-10-20 DIAGNOSIS — M8088XD Other osteoporosis with current pathological fracture, vertebra(e), subsequent encounter for fracture with routine healing: Secondary | ICD-10-CM | POA: Diagnosis not present

## 2017-10-20 DIAGNOSIS — Z950 Presence of cardiac pacemaker: Secondary | ICD-10-CM | POA: Diagnosis not present

## 2017-10-20 DIAGNOSIS — I4891 Unspecified atrial fibrillation: Secondary | ICD-10-CM | POA: Diagnosis not present

## 2017-10-20 DIAGNOSIS — J449 Chronic obstructive pulmonary disease, unspecified: Secondary | ICD-10-CM | POA: Diagnosis not present

## 2017-10-20 LAB — POCT INR: INR: 2.3

## 2017-10-21 DIAGNOSIS — K219 Gastro-esophageal reflux disease without esophagitis: Secondary | ICD-10-CM | POA: Diagnosis not present

## 2017-10-21 DIAGNOSIS — E860 Dehydration: Secondary | ICD-10-CM | POA: Diagnosis not present

## 2017-10-22 ENCOUNTER — Inpatient Hospital Stay (HOSPITAL_COMMUNITY)
Admission: EM | Admit: 2017-10-22 | Discharge: 2017-10-27 | DRG: 809 | Disposition: A | Discharge status: Home Health | Payer: Medicare Other | Attending: Internal Medicine | Admitting: Internal Medicine

## 2017-10-22 ENCOUNTER — Emergency Department (HOSPITAL_COMMUNITY): Payer: Medicare Other

## 2017-10-22 ENCOUNTER — Other Ambulatory Visit: Payer: Self-pay

## 2017-10-22 ENCOUNTER — Encounter (HOSPITAL_COMMUNITY): Payer: Self-pay

## 2017-10-22 ENCOUNTER — Telehealth: Payer: Self-pay | Admitting: *Deleted

## 2017-10-22 DIAGNOSIS — D709 Neutropenia, unspecified: Secondary | ICD-10-CM | POA: Diagnosis not present

## 2017-10-22 DIAGNOSIS — R509 Fever, unspecified: Secondary | ICD-10-CM | POA: Diagnosis not present

## 2017-10-22 DIAGNOSIS — T451X5A Adverse effect of antineoplastic and immunosuppressive drugs, initial encounter: Secondary | ICD-10-CM | POA: Diagnosis present

## 2017-10-22 DIAGNOSIS — Z950 Presence of cardiac pacemaker: Secondary | ICD-10-CM | POA: Diagnosis present

## 2017-10-22 DIAGNOSIS — C50919 Malignant neoplasm of unspecified site of unspecified female breast: Secondary | ICD-10-CM | POA: Diagnosis present

## 2017-10-22 DIAGNOSIS — R5081 Fever presenting with conditions classified elsewhere: Secondary | ICD-10-CM | POA: Diagnosis present

## 2017-10-22 DIAGNOSIS — Z882 Allergy status to sulfonamides status: Secondary | ICD-10-CM

## 2017-10-22 DIAGNOSIS — I495 Sick sinus syndrome: Secondary | ICD-10-CM | POA: Diagnosis not present

## 2017-10-22 DIAGNOSIS — Z8249 Family history of ischemic heart disease and other diseases of the circulatory system: Secondary | ICD-10-CM | POA: Diagnosis not present

## 2017-10-22 DIAGNOSIS — R791 Abnormal coagulation profile: Secondary | ICD-10-CM | POA: Diagnosis not present

## 2017-10-22 DIAGNOSIS — Z79890 Hormone replacement therapy (postmenopausal): Secondary | ICD-10-CM

## 2017-10-22 DIAGNOSIS — C7951 Secondary malignant neoplasm of bone: Secondary | ICD-10-CM | POA: Diagnosis not present

## 2017-10-22 DIAGNOSIS — D630 Anemia in neoplastic disease: Secondary | ICD-10-CM | POA: Diagnosis not present

## 2017-10-22 DIAGNOSIS — I4891 Unspecified atrial fibrillation: Secondary | ICD-10-CM | POA: Diagnosis not present

## 2017-10-22 DIAGNOSIS — J449 Chronic obstructive pulmonary disease, unspecified: Secondary | ICD-10-CM | POA: Diagnosis not present

## 2017-10-22 DIAGNOSIS — D61818 Other pancytopenia: Secondary | ICD-10-CM | POA: Diagnosis not present

## 2017-10-22 DIAGNOSIS — D62 Acute posthemorrhagic anemia: Secondary | ICD-10-CM | POA: Diagnosis present

## 2017-10-22 DIAGNOSIS — K529 Noninfective gastroenteritis and colitis, unspecified: Secondary | ICD-10-CM | POA: Diagnosis present

## 2017-10-22 DIAGNOSIS — K219 Gastro-esophageal reflux disease without esophagitis: Secondary | ICD-10-CM | POA: Diagnosis not present

## 2017-10-22 DIAGNOSIS — Z881 Allergy status to other antibiotic agents status: Secondary | ICD-10-CM

## 2017-10-22 DIAGNOSIS — D61810 Antineoplastic chemotherapy induced pancytopenia: Secondary | ICD-10-CM | POA: Diagnosis not present

## 2017-10-22 DIAGNOSIS — I480 Paroxysmal atrial fibrillation: Secondary | ICD-10-CM | POA: Diagnosis present

## 2017-10-22 DIAGNOSIS — C50912 Malignant neoplasm of unspecified site of left female breast: Secondary | ICD-10-CM | POA: Diagnosis not present

## 2017-10-22 DIAGNOSIS — I482 Chronic atrial fibrillation, unspecified: Secondary | ICD-10-CM | POA: Diagnosis present

## 2017-10-22 DIAGNOSIS — I10 Essential (primary) hypertension: Secondary | ICD-10-CM | POA: Diagnosis not present

## 2017-10-22 DIAGNOSIS — Z170 Estrogen receptor positive status [ER+]: Secondary | ICD-10-CM | POA: Diagnosis not present

## 2017-10-22 DIAGNOSIS — Z880 Allergy status to penicillin: Secondary | ICD-10-CM | POA: Diagnosis not present

## 2017-10-22 DIAGNOSIS — Z885 Allergy status to narcotic agent status: Secondary | ICD-10-CM

## 2017-10-22 DIAGNOSIS — Z79899 Other long term (current) drug therapy: Secondary | ICD-10-CM

## 2017-10-22 DIAGNOSIS — R0602 Shortness of breath: Secondary | ICD-10-CM | POA: Diagnosis not present

## 2017-10-22 DIAGNOSIS — E039 Hypothyroidism, unspecified: Secondary | ICD-10-CM | POA: Diagnosis present

## 2017-10-22 DIAGNOSIS — R000 Tachycardia, unspecified: Secondary | ICD-10-CM | POA: Diagnosis not present

## 2017-10-22 DIAGNOSIS — Z809 Family history of malignant neoplasm, unspecified: Secondary | ICD-10-CM | POA: Diagnosis not present

## 2017-10-22 DIAGNOSIS — Z79811 Long term (current) use of aromatase inhibitors: Secondary | ICD-10-CM

## 2017-10-22 DIAGNOSIS — M1990 Unspecified osteoarthritis, unspecified site: Secondary | ICD-10-CM | POA: Diagnosis present

## 2017-10-22 DIAGNOSIS — Z7901 Long term (current) use of anticoagulants: Secondary | ICD-10-CM

## 2017-10-22 DIAGNOSIS — Z888 Allergy status to other drugs, medicaments and biological substances status: Secondary | ICD-10-CM

## 2017-10-22 LAB — CBC WITH DIFFERENTIAL/PLATELET
Basophils Absolute: 0 10*3/uL (ref 0.0–0.1)
Basophils Relative: 2 %
Eosinophils Absolute: 0 10*3/uL (ref 0.0–0.7)
Eosinophils Relative: 0 %
HCT: 32 % — ABNORMAL LOW (ref 36.0–46.0)
Hemoglobin: 10.5 g/dL — ABNORMAL LOW (ref 12.0–15.0)
Lymphocytes Relative: 44 %
Lymphs Abs: 0.4 10*3/uL — ABNORMAL LOW (ref 0.7–4.0)
MCH: 29 pg (ref 26.0–34.0)
MCHC: 32.8 g/dL (ref 30.0–36.0)
MCV: 88.4 fL (ref 78.0–100.0)
Monocytes Absolute: 0.1 10*3/uL (ref 0.1–1.0)
Monocytes Relative: 9 %
Neutro Abs: 0.4 10*3/uL — ABNORMAL LOW (ref 1.7–7.7)
Neutrophils Relative %: 45 %
Platelets: 94 10*3/uL — ABNORMAL LOW (ref 150–400)
RBC: 3.62 MIL/uL — ABNORMAL LOW (ref 3.87–5.11)
RDW: 18.2 % — ABNORMAL HIGH (ref 11.5–15.5)
WBC: 0.9 10*3/uL — CL (ref 4.0–10.5)

## 2017-10-22 LAB — COMPREHENSIVE METABOLIC PANEL
ALT: 17 U/L (ref 14–54)
AST: 24 U/L (ref 15–41)
Albumin: 3.8 g/dL (ref 3.5–5.0)
Alkaline Phosphatase: 62 U/L (ref 38–126)
Anion gap: 9 (ref 5–15)
BUN: 10 mg/dL (ref 6–20)
CO2: 23 mmol/L (ref 22–32)
Calcium: 8.8 mg/dL — ABNORMAL LOW (ref 8.9–10.3)
Chloride: 108 mmol/L (ref 101–111)
Creatinine, Ser: 1.18 mg/dL — ABNORMAL HIGH (ref 0.44–1.00)
GFR calc Af Amer: 46 mL/min — ABNORMAL LOW (ref >60)
GFR calc non Af Amer: 39 mL/min — ABNORMAL LOW (ref >60)
Glucose, Bld: 102 mg/dL — ABNORMAL HIGH (ref 65–99)
Potassium: 4.3 mmol/L (ref 3.5–5.1)
Sodium: 140 mmol/L (ref 135–145)
Total Bilirubin: 1.9 mg/dL — ABNORMAL HIGH (ref 0.3–1.2)
Total Protein: 6.9 g/dL (ref 6.5–8.1)

## 2017-10-22 LAB — URINALYSIS, ROUTINE W REFLEX MICROSCOPIC
Bilirubin Urine: NEGATIVE
Glucose, UA: NEGATIVE mg/dL
Ketones, ur: NEGATIVE mg/dL
Leukocytes, UA: NEGATIVE
Nitrite: NEGATIVE
Protein, ur: NEGATIVE mg/dL
Specific Gravity, Urine: 1.013 (ref 1.005–1.030)
pH: 7 (ref 5.0–8.0)

## 2017-10-22 LAB — I-STAT CG4 LACTIC ACID, ED: Lactic Acid, Venous: 1.16 mmol/L (ref 0.5–1.9)

## 2017-10-22 LAB — PROTIME-INR
INR: 2.91
Prothrombin Time: 30.2 seconds — ABNORMAL HIGH (ref 11.4–15.2)

## 2017-10-22 MED ORDER — ACETAMINOPHEN 650 MG RE SUPP: 650.0000 mg | Freq: Four times a day (QID) | RECTAL | Status: DC | PRN

## 2017-10-22 MED ORDER — LEVOTHYROXINE SODIUM 100 MCG PO TABS
100.0000 ug | ORAL_TABLET | Freq: Every day | ORAL | Status: DC
  Administered 2017-10-23 – 2017-10-27 (×4): 100 ug via ORAL
  Filled 2017-10-22 (×5): qty 1

## 2017-10-22 MED ORDER — ONDANSETRON HCL 4 MG/2ML IJ SOLN: 4.0000 mg | Freq: Four times a day (QID) | INTRAMUSCULAR | Status: DC | PRN

## 2017-10-22 MED ORDER — ONDANSETRON HCL 4 MG PO TABS: 4.0000 mg | ORAL_TABLET | Freq: Four times a day (QID) | ORAL | Status: DC | PRN

## 2017-10-22 MED ORDER — WARFARIN SODIUM 5 MG PO TABS: 5.0000 mg | ORAL_TABLET | ORAL | Status: DC

## 2017-10-22 MED ORDER — VITAMIN D (ERGOCALCIFEROL) 1.25 MG (50000 UNIT) PO CAPS
50000.0000 [IU] | ORAL_CAPSULE | ORAL | Status: DC
  Administered 2017-10-25: 50000 [IU] via ORAL
  Filled 2017-10-22: qty 1

## 2017-10-22 MED ORDER — EXEMESTANE 25 MG PO TABS
25.0000 mg | ORAL_TABLET | Freq: Every day | ORAL | Status: DC
  Administered 2017-10-26: 25 mg via ORAL
  Filled 2017-10-22 (×5): qty 1

## 2017-10-22 MED ORDER — SODIUM CHLORIDE 0.9% FLUSH
3.0000 mL | Freq: Two times a day (BID) | INTRAVENOUS | Status: DC
  Administered 2017-10-22 – 2017-10-26 (×5): 3 mL via INTRAVENOUS

## 2017-10-22 MED ORDER — ALPRAZOLAM 0.25 MG PO TABS
0.2500 mg | ORAL_TABLET | Freq: Two times a day (BID) | ORAL | Status: DC | PRN
  Administered 2017-10-22 – 2017-10-26 (×3): 0.25 mg via ORAL
  Filled 2017-10-22 (×3): qty 1

## 2017-10-22 MED ORDER — DIPHENHYDRAMINE HCL 25 MG PO CAPS
25.0000 mg | ORAL_CAPSULE | Freq: Once | ORAL | Status: AC
  Administered 2017-10-22: 25 mg via ORAL
  Filled 2017-10-22: qty 1

## 2017-10-22 MED ORDER — DIPHENOXYLATE-ATROPINE 2.5-0.025 MG PO TABS: 1.0000 | ORAL_TABLET | Freq: Four times a day (QID) | ORAL | Status: DC | PRN

## 2017-10-22 MED ORDER — WARFARIN - PHYSICIAN DOSING INPATIENT: Freq: Every day | Status: DC

## 2017-10-22 MED ORDER — HYDROCODONE-ACETAMINOPHEN 5-325 MG PO TABS: 1.0000 | ORAL_TABLET | Freq: Four times a day (QID) | ORAL | Status: DC | PRN

## 2017-10-22 MED ORDER — SODIUM CHLORIDE 0.9 % IV SOLN
250.0000 mL | INTRAVENOUS | Status: DC | PRN
  Administered 2017-10-25: 250 mL via INTRAVENOUS

## 2017-10-22 MED ORDER — VANCOMYCIN HCL 10 G IV SOLR
1500.0000 mg | INTRAVENOUS | Status: DC
Start: 2017-10-22 — End: 2017-10-25
  Administered 2017-10-22 – 2017-10-24 (×2): 1500 mg via INTRAVENOUS
  Filled 2017-10-22 (×2): qty 1500

## 2017-10-22 MED ORDER — SODIUM CHLORIDE 0.9% FLUSH: 3.0000 mL | INTRAVENOUS | Status: DC | PRN

## 2017-10-22 MED ORDER — VANCOMYCIN HCL IN DEXTROSE 1-5 GM/200ML-% IV SOLN
1000.0000 mg | Freq: Once | INTRAVENOUS | Status: AC
  Administered 2017-10-23: 1000 mg via INTRAVENOUS
  Filled 2017-10-22: qty 200

## 2017-10-22 MED ORDER — SODIUM CHLORIDE 0.9 % IV SOLN
1.0000 g | Freq: Three times a day (TID) | INTRAVENOUS | Status: DC
  Administered 2017-10-22 – 2017-10-23 (×2): 1 g via INTRAVENOUS
  Filled 2017-10-22 (×2): qty 1

## 2017-10-22 MED ORDER — WARFARIN SODIUM 2.5 MG PO TABS
2.5000 mg | ORAL_TABLET | ORAL | Status: DC
Start: 2017-10-23 — End: 2017-10-22

## 2017-10-22 MED ORDER — SODIUM CHLORIDE 0.9 % IV SOLN
2.0000 g | Freq: Once | INTRAVENOUS | Status: AC
  Administered 2017-10-22: 2 g via INTRAVENOUS
  Filled 2017-10-22: qty 2

## 2017-10-22 MED ORDER — NAPHAZOLINE-GLYCERIN 0.012-0.2 % OP SOLN: 1.0000 [drp] | Freq: Four times a day (QID) | OPHTHALMIC | Status: DC | PRN

## 2017-10-22 MED ORDER — SODIUM CHLORIDE 0.9 % IV SOLN
INTRAVENOUS | Status: AC
  Administered 2017-10-23: 12:00:00 via INTRAVENOUS

## 2017-10-22 MED ORDER — POLYETHYLENE GLYCOL 3350 17 G PO PACK: 17.0000 g | PACK | Freq: Every day | ORAL | Status: DC | PRN

## 2017-10-22 MED ORDER — ALBUTEROL SULFATE (2.5 MG/3ML) 0.083% IN NEBU: 3.0000 mL | INHALATION_SOLUTION | Freq: Four times a day (QID) | RESPIRATORY_TRACT | Status: DC | PRN

## 2017-10-22 MED ORDER — WARFARIN SODIUM 1 MG PO TABS
1.0000 mg | ORAL_TABLET | Freq: Once | ORAL | Status: AC
  Administered 2017-10-22: 1 mg via ORAL
  Filled 2017-10-22 (×2): qty 1

## 2017-10-22 MED ORDER — PANTOPRAZOLE SODIUM 40 MG PO TBEC
40.0000 mg | DELAYED_RELEASE_TABLET | Freq: Every day | ORAL | Status: DC
  Administered 2017-10-25 – 2017-10-26 (×2): 40 mg via ORAL
  Filled 2017-10-22 (×3): qty 1

## 2017-10-22 MED ORDER — DIPHENHYDRAMINE HCL 50 MG/ML IJ SOLN
25.0000 mg | Freq: Four times a day (QID) | INTRAMUSCULAR | Status: DC | PRN
  Administered 2017-10-23: 25 mg via INTRAVENOUS
  Filled 2017-10-22: qty 1

## 2017-10-22 MED ORDER — ACETAMINOPHEN 325 MG PO TABS
650.0000 mg | ORAL_TABLET | Freq: Four times a day (QID) | ORAL | Status: DC | PRN
  Administered 2017-10-24: 650 mg via ORAL
  Filled 2017-10-22: qty 2

## 2017-10-22 MED ORDER — WARFARIN - PHARMACIST DOSING INPATIENT
Freq: Every day | Status: DC
Start: 2017-10-23 — End: 2017-10-27

## 2017-10-22 MED ORDER — DIPHENHYDRAMINE HCL 50 MG/ML IJ SOLN
25.0000 mg | INTRAMUSCULAR | Status: DC
  Administered 2017-10-24: 25 mg via INTRAVENOUS
  Filled 2017-10-22: qty 1
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
## 2017-10-22 NOTE — ED Triage Notes (Addendum)
Pt arrives from home by EMS. Per EMS--CA patient (breast + bone). Recently taken off coumadin reports having black runny stools. RN collected labs yesterday and called pt back today stating she has a low lab count (unsure what lab was critical) and MD wanted her to be seen in ED. Also reports fever/chills x 3 days. Rt c/o weakness, but EMS reports pt able to ambulate to truck for transport.

## 2017-10-22 NOTE — ED Notes (Signed)
Bed: WA03 Expected date:  Expected time:  Means of arrival:  Comments: EMS 

## 2017-10-22 NOTE — ED Notes (Signed)
ED TO INPATIENT HANDOFF REPORT  Name/Age/Gender Amy White 84 y.o. female  Code Status Code Status History    Date Active Date Inactive Code Status Order ID Comments User Context   07/31/2014 1551 08/01/2014 1436 Full Code [id number]  Sanda Klein, MD Inpatient   07/27/2014 1612 07/29/2014 1336 Partial Code [id number]  Kelvin Cellar, MD ED   03/29/2014 2227 04/01/2014 1647 Full Code [id number]  Merton Border, MD Inpatient      Home/SNF/Other Home  Chief Complaint Low Blood Count  Level of Care/Admitting Diagnosis ED Disposition    ED Disposition Condition [place]: [facility] [[id number]]  Level of Care: Med-Surg [16]  Diagnosis: Febrile neutropenia [facility]) [[id number]]  Admitting Physician: Cristy Folks [[id number]]  Attending Physician: Cristy Folks [[id number]]  Estimated length of stay: past midnight tomorrow  Certification:: I certify this patient will need inpatient services for at least 2 midnights  PT Class (Do Not Modify): Inpatient [101]  PT Acc Code (Do Not Modify): Private [1]       Medical History Past Medical History:  Diagnosis Date  . Anxiety   . Arthritis    "all over"  . Asthma   . Atrial fibrillation (HCC)    a. paroxysmal, on Coumadin for anticoagulation  . Breast cancer ([place]) 1997   "left"  . Breast cancer metastasized to bone ([place]) 05/13/2011  . COPD (chronic obstructive pulmonary disease) ([place])   . Dysrhythmia   . GERD (gastroesophageal reflux disease)   . H/O hiatal hernia   . Hypertension   . Hyperthyroidism   . Presence of permanent cardiac pacemaker   . Slow urinary stream   . SSS (sick sinus syndrome) ([place])    a. s/p PPM placement in 2016.    Allergies Allergies  Allergen Reactions  . Morphine And Related Rash  . Crestor [Rosuvastatin] Other (See Comments)    Reaction unknown  . Sulfa Antibiotics Other (See Comments)    Mouth breaks out in sores  . Ciprofloxacin Rash and Other (See  Comments)    Rash in the mouth  . Penicillins Hives, Rash and Other (See Comments)    Has patient had a PCN reaction causing immediate rash, facial/tongue/throat swelling, SOB or lightheadedness with hypotension: yes Has patient had a PCN reaction causing severe rash involving mucus membranes or skin necrosis: no Has patient had a PCN reaction that required hospitalization: no Has patient had a PCN reaction occurring within the last 10 years: yes If all of the above answers are "NO", then may proceed with Cephalosporin use.   . Vancomycin Rash and Other (See Comments)    "Red-man"    IV Location/Drains/Wounds Patient Lines/Drains/Airways Status   Active Line/Drains/Airways    Name:   Placement date:   Placement time:   Site:   Days:   Peripheral IV 10/22/17 Right Antecubital   10/22/17    1245    Antecubital   less than 1   Incision (Closed) 07/31/14 Chest Right   07/31/14    1530     1179   Incision (Closed) 05/01/15 Groin Right   05/01/15    2423     536          Labs/Imaging Results for orders placed or performed during the hospital encounter of 10/22/17 (from the past 48 hour(s))  Comprehensive metabolic panel     Status: Abnormal   Collection Time: 10/22/17 12:47 PM  Result Value Ref Range   Sodium  140 135 - 145 mmol/L   Potassium 4.3 3.5 - 5.1 mmol/L   Chloride 108 101 - 111 mmol/L   CO2 23 22 - 32 mmol/L   Glucose, Bld 102 (H) 65 - 99 mg/dL   BUN 10 6 - 20 mg/dL   Creatinine, Ser 1.18 (H) 0.44 - 1.00 mg/dL   Calcium 8.8 (L) 8.9 - 10.3 mg/dL   Total Protein 6.9 6.5 - 8.1 g/dL   Albumin 3.8 3.5 - 5.0 g/dL   AST 24 15 - 41 U/L   ALT 17 14 - 54 U/L   Alkaline Phosphatase 62 38 - 126 U/L   Total Bilirubin 1.9 (H) 0.3 - 1.2 mg/dL   GFR calc non Af Amer 39 (L) >60 mL/min   GFR calc Af Amer 46 (L) >60 mL/min    Comment: (NOTE) The eGFR has been calculated using the CKD EPI equation. This calculation has not been validated in all clinical situations. eGFR's persistently  <60 mL/min signify possible Chronic Kidney Disease.    Anion gap 9 5 - 15    Comment: Performed at [facility], [place] [address], [place], [place] [address]  CBC with Differential     Status: Abnormal   Collection Time: 10/22/17 12:47 PM  Result Value Ref Range   WBC 0.9 (LL) 4.0 - 10.5 K/uL    Comment: REPEATED TO VERIFY CRITICAL RESULT CALLED TO, READ BACK BY AND VERIFIED WITH: TALKINGTON,J. RN _0  ON 05.17.19 BY COHEN,K    RBC 3.62 (L) 3.87 - 5.11 MIL/uL   Hemoglobin 10.5 (L) 12.0 - 15.0 g/dL   HCT 32.0 (L) 36.0 - 46.0 %   MCV 88.4 78.0 - 100.0 fL   MCH 29.0 26.0 - 34.0 pg   MCHC 32.8 30.0 - 36.0 g/dL   RDW 18.2 (H) 11.5 - 15.5 %   Platelets 94 (L) 150 - 400 K/uL    Comment: REPEATED TO VERIFY SPECIMEN CHECKED FOR CLOTS PLATELET COUNT CONFIRMED BY SMEAR    Neutrophils Relative % 45 %   Lymphocytes Relative 44 %   Monocytes Relative 9 %   Eosinophils Relative 0 %   Basophils Relative 2 %   Neutro Abs 0.4 (L) 1.7 - 7.7 K/uL   Lymphs Abs 0.4 (L) 0.7 - 4.0 K/uL   Monocytes Absolute 0.1 0.1 - 1.0 K/uL   Eosinophils Absolute 0.0 0.0 - 0.7 K/uL   Basophils Absolute 0.0 0.0 - 0.1 K/uL   WBC Morphology WHITE COUNT CONFIRMED ON SMEAR    Smear Review MORPHOLOGY UNREMARKABLE     Comment: Performed at [facility], [place] [address] [place], [place] [address]  Protime-INR     Status: Abnormal   Collection Time: 10/22/17 12:47 PM  Result Value Ref Range   Prothrombin Time 30.2 (H) 11.4 - 15.2 seconds   INR 2.91     Comment: Performed at [facility], [place] [address] [place], [place] [address]  I-Stat CG4 Lactic Acid, ED     Status: None   Collection Time: 10/22/17 12:54 PM  Result Value Ref Range   Lactic Acid, Venous 1.16 0.5 - 1.9 mmol/L  Urinalysis, Routine w reflex microscopic     Status: Abnormal   Collection Time: 10/22/17  3:07 PM  Result Value Ref Range   Color, Urine YELLOW YELLOW   APPearance CLEAR CLEAR    Specific Gravity, Urine 1.013 1.005 - 1.030   pH 7.0 5.0 - 8.0   Glucose, UA NEGATIVE NEGATIVE mg/dL  Hgb urine dipstick MODERATE (A) NEGATIVE   Bilirubin Urine NEGATIVE NEGATIVE   Ketones, ur NEGATIVE NEGATIVE mg/dL   Protein, ur NEGATIVE NEGATIVE mg/dL   Nitrite NEGATIVE NEGATIVE   Leukocytes, UA NEGATIVE NEGATIVE   RBC / HPF 11-20 0 - 5 RBC/hpf   WBC, UA 0-5 0 - 5 WBC/hpf   Bacteria, UA RARE (A) NONE SEEN   Squamous Epithelial / LPF 0-5 0 - 5   Mucus PRESENT    Hyaline Casts, UA PRESENT     Comment: Performed at [facility], [place] [address] [place], [place] [address]   Dg Chest 2 View  Result Date: 10/22/2017 CLINICAL DATA:  Fever, shortness of breath EXAM: CHEST - 2 VIEW COMPARISON:  02/21/2016 FINDINGS: There is mild right middle lobe scarring. There is no focal consolidation. The heart and mediastinal contours are unremarkable. There is a dual lead cardiac pacemaker in satisfactory position. The osseous structures are unremarkable. IMPRESSION: No active cardiopulmonary disease. Electronically Signed   By: Kathreen Devoid   On: 10/22/2017 13:15    Pending Labs Unresulted Labs (From admission, onward)   Start     Ordered   10/22/17 1224  Culture, blood (Routine x 2)  BLOOD CULTURE X 2,   STAT     10/22/17 1224   10/22/17 1224  Urine culture  STAT,   STAT     10/22/17 1224   Signed and Held  Basic metabolic panel  Tomorrow morning,   R     Signed and Held   Signed and Held  CBC  Tomorrow morning,   R     Signed and Held   Signed and Held  Protime-INR  Tomorrow morning,   R     Signed and Held      Vitals/Pain Today's Vitals   10/22/17 1500 10/22/17 1530 10/22/17 1605 10/22/17 1632  BP: (!) 156/85 (!) 146/79  (!) 145/77  Pulse: (!) 113 100  (!) 119  Resp: (!) 23 17  (!) 24  Temp:   98.7 F (37.1 C)   TempSrc:   Oral   SpO2: 98% 98%  96%  Weight:      Height:      PainSc:        Isolation Precautions No active  isolations  Medications Medications  aztreonam (AZACTAM) 2 g in sodium chloride 0.9 % 100 mL IVPB (2 g Intravenous New Bag/Given 10/22/17 1635)    Mobility walks with device, normally ambulatory with no assistance, increased weakness x1 week, cane use

## 2017-10-22 NOTE — Progress Notes (Signed)
ANTICOAGULATION CONSULT NOTE - Initial Consult  Pharmacy Consult for Coumadin Indication: atrial fibrillation  Allergies  Allergen Reactions  . Morphine And Related Rash  . Crestor [Rosuvastatin] Other (See Comments)    Reaction unknown  . Sulfa Antibiotics Other (See Comments)    Mouth breaks out in sores  . Ciprofloxacin Rash and Other (See Comments)    Rash in the mouth  . Penicillins Hives, Rash and Other (See Comments)    Has patient had a PCN reaction causing immediate rash, facial/tongue/throat swelling, SOB or lightheadedness with hypotension: yes Has patient had a PCN reaction causing severe rash involving mucus membranes or skin necrosis: no Has patient had a PCN reaction that required hospitalization: no Has patient had a PCN reaction occurring within the last 10 years: yes If all of the above answers are "NO", then may proceed with Cephalosporin use.   . Vancomycin Rash and Other (See Comments)    "Red-man"    Patient Measurements: Height: 5\' 7"  (170.2 cm) Weight: 163 lb (73.9 kg) IBW/kg (Calculated) : 61.6  Vital Signs: Temp: 98.7 F (37.1 C) (05/17 1605) Temp Source: Oral (05/17 1605) BP: 145/77 (05/17 1632) Pulse Rate: 119 (05/17 1632)  Labs: Recent Labs    10/20/17 10/22/17 1247  HGB  --  10.5*  HCT  --  32.0*  PLT  --  94*  LABPROT  --  30.2*  INR 2.3 2.91  CREATININE  --  1.18*    Estimated Creatinine Clearance: 30.8 mL/min (A) (by C-G formula based on SCr of 1.18 mg/dL (H)).   Medical History: Past Medical History:  Diagnosis Date  . Anxiety   . Arthritis    "all over"  . Asthma   . Atrial fibrillation (HCC)    a. paroxysmal, on Coumadin for anticoagulation  . Breast cancer ([place]) 1997   "left"  . Breast cancer metastasized to bone ([place]) 05/13/2011  . COPD (chronic obstructive pulmonary disease) ([place])   . Dysrhythmia   . GERD (gastroesophageal reflux disease)   . H/O hiatal hernia   . Hypertension   . Hyperthyroidism   . Presence  of permanent cardiac pacemaker   . Slow urinary stream   . SSS (sick sinus syndrome) ([place])    a. s/p PPM placement in 2016.    Medications: PTA Coumadin 2.5mg  daily except 5mg  on Tues/Thurs  Assessment: 84 yo F on chronic Coumadin for hx Afib.   10/22/2017:  INR therapeutic at upper end of goal range on admission (2.91).    Reports of dark, tarry stools PTA.  Hg & pltc are low, however this is thought to be related to chemotherapy, not active bleed.    Drug interactions:  Broad-spectrum antibiotics can increase INR  Goal of Therapy:  INR 2-3   Plan:  Coumadin 1mg  PO x1 at 6pm Daily INR F/U CBC, s/sx of bleeding  Adama Ivins, Lavonia Drafts 10/22/2017,4:57 PM

## 2017-10-22 NOTE — ED Notes (Signed)
Date and time results received: 10/22/17 2:19 PM   Test: WBC Critical Value: 0.9  Name of Provider Notified: Pfeiffer  Orders Received? Or Actions Taken?: MD made aware, waiting for orders.
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
## 2017-10-22 NOTE — Progress Notes (Addendum)
Pharmacy Antibiotic Note  Amy White is a 84 y.o. female admitted on 10/22/2017 with febrile neutropenia.  She has metastatic breast CA currently undergoing treatment.   Pharmacy has been consulted for Vancomycin dosing.   PCN allergy noted.  Patient has tolerated cephalosporins in the past (Rocephin, cephalexin). She also has hx RedMan's Syndrome with Vancomycin.    10/22/2017:  Afebrile since admission, reports fever 103 F in past 24 hours  Neutropenic  Scr 1.18, est CrCl~ 35ml/min  CXR, UA not suggestive of infection  Plan: Vancomycin 1500mg  IV Q48h Run over slower rate to minimize risk of RedMan's Syndrome Target AUC 400-500 Monitor renal function and cx data  Aztreonam per MD- consider change to Cefepime  Height: 5\' 7"  (170.2 cm) Weight: 163 lb (73.9 kg) IBW/kg (Calculated) : 61.6  Temp (24hrs), Avg:98.9 F (37.2 C), Min:98.7 F (37.1 C), Max:99.1 F (37.3 C)  Recent Labs  Lab 10/22/17 1247 10/22/17 1254  WBC 0.9*  --   CREATININE 1.18*  --   LATICACIDVEN  --  1.16    Estimated Creatinine Clearance: 30.8 mL/min (A) (by C-G formula based on SCr of 1.18 mg/dL (H)).    Allergies  Allergen Reactions  . Morphine And Related Rash  . Crestor [Rosuvastatin] Other (See Comments)    Reaction unknown  . Sulfa Antibiotics Other (See Comments)    Mouth breaks out in sores  . Ciprofloxacin Rash and Other (See Comments)    Rash in the mouth  . Penicillins Hives, Rash and Other (See Comments)    Has patient had a PCN reaction causing immediate rash, facial/tongue/throat swelling, SOB or lightheadedness with hypotension: yes Has patient had a PCN reaction causing severe rash involving mucus membranes or skin necrosis: no Has patient had a PCN reaction that required hospitalization: no Has patient had a PCN reaction occurring within the last 10 years: yes If all of the above answers are "NO", then may proceed with Cephalosporin use.   . Vancomycin Rash and Other (See  Comments)    "Red-man"    Antimicrobials this admission: 5/17 Vanc >>  5/17 Aztreonam >>   Dose adjustments this admission:  Microbiology results: 5/17 BCx:  5/17 UCx:   MRSA PCR:   Thank you for allowing pharmacy to be a part of this patient's care.  Biagio Borg 10/22/2017 4:38 PM

## 2017-10-22 NOTE — Telephone Encounter (Signed)
Received call from [place], [place]. Patient had labs drawn with their agency this week which showed WBC 0.9 and ANC 0.4. The patient is reporting a temperature today above 100, and last night her temp spiked to 103. She would like to know what actions should be taken.  Patient is on oral Verzenio  Directed the RN to direct the patient to the ED at [facility]. The patient is neutropenic with high fevers and needs urgent work up. Amy RN understands and will have the patient go to the ED.  Laverna Peace NP notified of above.

## 2017-10-23 DIAGNOSIS — Z170 Estrogen receptor positive status [ER+]: Secondary | ICD-10-CM

## 2017-10-23 DIAGNOSIS — C50919 Malignant neoplasm of unspecified site of unspecified female breast: Secondary | ICD-10-CM

## 2017-10-23 DIAGNOSIS — D61810 Antineoplastic chemotherapy induced pancytopenia: Principal | ICD-10-CM

## 2017-10-23 DIAGNOSIS — D709 Neutropenia, unspecified: Secondary | ICD-10-CM

## 2017-10-23 DIAGNOSIS — D61818 Other pancytopenia: Secondary | ICD-10-CM

## 2017-10-23 DIAGNOSIS — Z7901 Long term (current) use of anticoagulants: Secondary | ICD-10-CM

## 2017-10-23 DIAGNOSIS — C7951 Secondary malignant neoplasm of bone: Secondary | ICD-10-CM

## 2017-10-23 DIAGNOSIS — I10 Essential (primary) hypertension: Secondary | ICD-10-CM

## 2017-10-23 DIAGNOSIS — T451X5A Adverse effect of antineoplastic and immunosuppressive drugs, initial encounter: Secondary | ICD-10-CM

## 2017-10-23 DIAGNOSIS — I480 Paroxysmal atrial fibrillation: Secondary | ICD-10-CM

## 2017-10-23 DIAGNOSIS — R5081 Fever presenting with conditions classified elsewhere: Secondary | ICD-10-CM

## 2017-10-23 DIAGNOSIS — I4891 Unspecified atrial fibrillation: Secondary | ICD-10-CM

## 2017-10-23 LAB — DIFFERENTIAL
Basophils Absolute: 0 K/uL (ref 0.0–0.1)
Basophils Relative: 1 %
Eosinophils Absolute: 0 K/uL (ref 0.0–0.7)
Eosinophils Relative: 0 %
Lymphocytes Relative: 40 %
Lymphs Abs: 0.5 10*3/uL — ABNORMAL LOW (ref 0.7–4.0)
Monocytes Absolute: 0.1 K/uL (ref 0.1–1.0)
Monocytes Relative: 10 %
Neutro Abs: 0.7 10*3/uL — ABNORMAL LOW (ref 1.7–7.7)
Neutrophils Relative %: 49 %
Smear Review: DECREASED

## 2017-10-23 LAB — BASIC METABOLIC PANEL
Anion gap: 11 (ref 5–15)
BUN: 9 mg/dL (ref 6–20)
CO2: 20 mmol/L — ABNORMAL LOW (ref 22–32)
Calcium: 8.4 mg/dL — ABNORMAL LOW (ref 8.9–10.3)
Chloride: 108 mmol/L (ref 101–111)
Creatinine, Ser: 1.06 mg/dL — ABNORMAL HIGH (ref 0.44–1.00)
GFR calc Af Amer: 52 mL/min — ABNORMAL LOW (ref >60)
GFR calc non Af Amer: 45 mL/min — ABNORMAL LOW (ref >60)
Glucose, Bld: 114 mg/dL — ABNORMAL HIGH (ref 65–99)
Potassium: 4.2 mmol/L (ref 3.5–5.1)
Sodium: 139 mmol/L (ref 135–145)

## 2017-10-23 LAB — URINE CULTURE

## 2017-10-23 LAB — CBC WITH DIFFERENTIAL/PLATELET

## 2017-10-23 LAB — PROTIME-INR
INR: 4.05
Prothrombin Time: 39.1 seconds — ABNORMAL HIGH (ref 11.4–15.2)

## 2017-10-23 MED ORDER — SODIUM CHLORIDE 0.9 % IV SOLN
2.0000 g | Freq: Two times a day (BID) | INTRAVENOUS | Status: DC
  Administered 2017-10-23 – 2017-10-27 (×8): 2 g via INTRAVENOUS
  Filled 2017-10-23 (×11): qty 2

## 2017-10-23 NOTE — Progress Notes (Addendum)
PROGRESS NOTE   Amy White  ZMO:[id number]    DOB: 10-Jun-1925    DOA: 10/22/2017  PCP: Mayra Neer, MD   I have briefly reviewed patients previous medical records in [facility].  Brief Narrative:  84 year old female with PMH of metastatic breast cancer with bone only mets, on combination of Aromasin/Versinio (Dr. Marin Olp, Oncology), paroxysmal A. fib on warfarin, SSS status post permanent pacemaker, hypothyroid, asthma/COPD, GERD, HTN, vertebral fractures (Dr. Christella Noa), chronic diarrhea since initiation of chemotherapy admitted with one-week history of intermittent fevers (up to 103 F at home), malaise, fatigue and admitted for febrile neutropenia.   Assessment & Plan:   Active Problems:   Breast cancer metastasized to bone [facility])   Atrial fibrillation (HCC)   SSS (sick sinus syndrome) [facility])   Pacemaker - Medtronic Advisa DR MRI model [id number] serial number [id number]   Essential hypertension   Neutropenic fever (HCC)   Febrile neutropenia (HCC)   Neutropenic fever: Pancytopenia on admission with ANC 400.  No obvious source of infection identified.  Blood cultures x2: Negative to date.  Urine culture: Seems like a contaminant sample.  Chest x-ray without acute findings.  Had been empirically started on IV aztreonam and vancomycin, now changed to IV cefepime and vancomycin.  Treat supportively and follow cultures.  I discussed with Dr. Burr Medico, Oncology who recommended discontinuing Versinio but continue Aromasin.  Pancytopenia: Secondary to metastatic cancer to bone and cancer chemotherapy.  Stable.  Follow daily CBCs.  ANC up to 700.  I discussed with Dr. Burr Medico who recommended holding off on G-CSF since patient's [place] is improving by itself.  Metastatic breast cancer: Holding chemotherapy medications.  Oncology consultation pending.  Paroxysmal atrial fibrillation: Stable.  Not on rate control medications.  Supratherapeutic INR likely related to acute illness.  Pharmacy managing  Coumadin.  Supratherapeutic INR: INR 4.05 on 5/18.  Coumadin per pharmacy.  Hypothyroid: Continue levothyroxine.  Asthma/COPD: Stable without clinical bronchospasm.  Chronic diarrhea: As per oncology, likely secondary to [place] which patient has stopped taking.  Patient reported to oncology that she had black stools which she did not to me.  Discussed with RN to monitor during next BM and consider FOBT testing and monitor CBCs daily.  In fact patient told me that she had not had a BM since 10/21/2017.   DVT prophylaxis: Anticoagulated on warfarin. Code Status: Full Family Communication: None at bedside Disposition: DC home pending clinical improvement, may take couple days.   Consultants:  Medical oncology.  Procedures:  None  Antimicrobials:  IV aztreonam-discontinued. IV cefepime & vancomycin.   Subjective: Feels better.  Fevers decreased.  Feels worn out "I feel like I have been hit by a truck".  Mild intermittent sore throat.  Denies headache, earache, cough, dyspnea, joint pains, skin rash, nausea, vomiting.  Chronic diarrhea is unchanged.  No urinary frequency or dysuria.  Appetite poor.  As per RN, patient developed red man syndrome to vancomycin and treated with Benadryl per pharmacy.  ROS: As above.  Objective:  Vitals:   10/22/17 2036 10/22/17 2134 10/23/17 0530 10/23/17 1405  BP: (!) 172/94 (!) 154/93 (!) 148/72 113/69  Pulse: (!) 102 (!) 102 96 (!) 104  Resp: 20 18 18 15   Temp: 98.4 F (36.9 C) 100 F (37.8 C) 99 F (37.2 C) 98.4 F (36.9 C)  TempSrc: Oral Oral Oral Oral  SpO2: (!) 80% 100% 100% 97%  Weight:      Height:        Examination:  General exam: Pleasant elderly female, moderately built and nourished lying comfortably propped up in bed.  Oral mucosa slightly dry. ENT: Posterior pharyngeal wall unremarkable without acute findings.  No oral thrush appreciated. Respiratory system: Clear to auscultation. Respiratory effort  normal. Cardiovascular system: S1 & S2 heard, RRR. No JVD, murmurs, rubs, gallops or clicks. No pedal edema. Gastrointestinal system: Abdomen is nondistended, soft and nontender. No organomegaly or masses felt. Normal bowel sounds heard. Central nervous system: Alert and oriented. No focal neurological deficits. Extremities: Symmetric 5 x 5 power. Skin: No rashes, lesions or ulcers Psychiatry: Judgement and insight appear normal. Mood & affect appropriate.     Data Reviewed: I have personally reviewed following labs and imaging studies  CBC: Recent Labs  Lab 10/22/17 1247 10/23/17 0330 10/23/17 6599  WBC 0.9* DUPLICATE REQUEST  1.4*  --   NEUTROABS 0.4* PENDING 0.7*  HGB 35.7* DUPLICATE REQUEST  01.7*  --   HCT 79.3* DUPLICATE REQUEST  90.3*  --   MCV 00.9 DUPLICATE REQUEST  23.3  --   PLT 94* DUPLICATE REQUEST  76*  --    Basic Metabolic Panel: Recent Labs  Lab 10/22/17 1247 10/23/17 0330  NA 140 139  K 4.3 4.2  CL 108 108  CO2 23 20*  GLUCOSE 102* 114*  BUN 10 9  CREATININE 1.18* 1.06*  CALCIUM 8.8* 8.4*   Liver Function Tests: Recent Labs  Lab 10/22/17 1247  AST 24  ALT 17  ALKPHOS 62  BILITOT 1.9*  PROT 6.9  ALBUMIN 3.8   Coagulation Profile: Recent Labs  Lab 10/20/17 10/22/17 1247 10/23/17 0330  INR 2.3 2.91 4.05*     Recent Results (from the past 240 hour(s))  Culture, blood (Routine x 2)     Status: None (Preliminary result)   Collection Time: 10/22/17 12:47 PM  Result Value Ref Range Status   Specimen Description   Final    BLOOD Blood Culture results may not be optimal due to an inadequate volume of blood received in culture bottles Performed at [facility], [place] [address], [place], [place] [address]    Special Requests   Final    BOTTLES DRAWN AEROBIC AND ANAEROBIC Blood Culture results may not be optimal due to an inadequate volume of blood received in culture bottles Performed at [facility] [address], [place], [place] [address]    Culture   Final    NO GROWTH < 24 HOURS Performed at [facility] [address], [place], [place] [address]    Report Status PENDING  Incomplete  Urine culture     Status: Abnormal   Collection Time: 10/22/17  3:07 PM  Result Value Ref Range Status   Specimen Description   Final    URINE, RANDOM Performed at [facility] [address] [place], [place] [address]    Special Requests   Final    NONE Performed at [facility], [place] [address], [place], [place] [address]    Culture MULTIPLE SPECIES PRESENT, SUGGEST RECOLLECTION (A)  Final   Report Status 10/23/2017 FINAL  Final  Culture, blood (Routine x 2)     Status: None (Preliminary result)   Collection Time: 10/22/17  4:02 PM  Result Value Ref Range Status   Specimen Description   Final    BLOOD RIGHT FOREARM Performed at [facility] [address] [place], [place] [address]    Special Requests  Final    BOTTLES DRAWN AEROBIC AND ANAEROBIC Blood Culture results may not be optimal due to an inadequate volume of blood received in culture bottles Performed at [facility], [place] [address] [place], [place] [address]    Culture   Final    NO GROWTH < 24 HOURS Performed at [facility] [address] [place], [place] [address]    Report Status PENDING  Incomplete         Radiology Studies: Dg Chest 2 View  Result Date: 10/22/2017 CLINICAL DATA:  Fever, shortness of breath EXAM: CHEST - 2 VIEW COMPARISON:  02/21/2016 FINDINGS: There is mild right middle lobe scarring. There is no focal consolidation. The heart and mediastinal contours are unremarkable. There is a dual lead cardiac pacemaker in satisfactory position. The osseous structures are unremarkable. IMPRESSION: No active cardiopulmonary disease. Electronically Signed   By: Kathreen Devoid   On: 10/22/2017 13:15         Scheduled Meds: . Derrill Memo ON 10/24/2017] diphenhydrAMINE  25 mg Intravenous Q48H  . exemestane  25 mg Oral QPC breakfast  . levothyroxine  100 mcg Oral QAC breakfast  . pantoprazole  40 mg Oral Daily  . sodium chloride flush  3 mL Intravenous Q12H  . [START ON 10/25/2017] Vitamin D (Ergocalciferol)  50,000 Units Oral Q7 days  . Warfarin - Pharmacist Dosing Inpatient   Does not apply q1800   Continuous Infusions: . sodium chloride    . sodium chloride 50 mL/hr at 10/23/17 1225  . ceFEPime (MAXIPIME) IV    . vancomycin Stopped (10/22/17 1936)     LOS: 1 day     Vernell Leep, MD, FACP, [facility]. [facility] Pager [phone]  If 7PM-7AM, please contact night-coverage [email or web address] Password [email or web address] 10/23/2017, 2:53 PM

## 2017-10-23 NOTE — Progress Notes (Signed)
PT Cancellation Note  Patient Details Name: Amy White MRN: [id number] DOB: 03/05/1926   Cancelled Treatment:    Reason Eval/Treat Not Completed: Fatigue/lethargy limiting ability to participate. Attempted a 2nd time to work with pt. She, again, politely declined to work with PT. Will check back another day.    Weston Anna, MPT Pager: [phone]

## 2017-10-23 NOTE — Progress Notes (Signed)
[place] for Coumadin Indication: atrial fibrillation  Allergies  Allergen Reactions  . Morphine And Related Rash  . Crestor [Rosuvastatin] Other (See Comments)    Reaction unknown  . Sulfa Antibiotics Other (See Comments)    Mouth breaks out in sores  . Ciprofloxacin Rash and Other (See Comments)    Rash in the mouth  . Penicillins Hives, Rash and Other (See Comments)    Has patient had a PCN reaction causing immediate rash, facial/tongue/throat swelling, SOB or lightheadedness with hypotension: yes Has patient had a PCN reaction causing severe rash involving mucus membranes or skin necrosis: no Has patient had a PCN reaction that required hospitalization: no Has patient had a PCN reaction occurring within the last 10 years: yes If all of the above answers are "NO", then may proceed with Cephalosporin use.   . Vancomycin Rash and Other (See Comments)    "Red-man"    Patient Measurements: Height: 5\' 8"  (172.7 cm) Weight: 158 lb 1.1 oz (71.7 kg) IBW/kg (Calculated) : 63.9  Vital Signs: Temp: 99 F (37.2 C) (05/18 0530) Temp Source: Oral (05/18 0530) BP: 148/72 (05/18 0530) Pulse Rate: 96 (05/18 0530)  Labs: Recent Labs    10/22/17 1247 10/23/17 9480  HGB 16.5* DUPLICATE REQUEST  53.7*  HCT 48.2* DUPLICATE REQUEST  70.7*  PLT 94* DUPLICATE REQUEST  76*  LABPROT 30.2* 39.1*  INR 2.91 4.05*  CREATININE 1.18* 1.06*    Estimated Creatinine Clearance: 35.6 mL/min (A) (by C-G formula based on SCr of 1.06 mg/dL (H)).  Medications: PTA Coumadin 2.5mg  daily except 5mg  on Tues/Thurs  Assessment: 84 yo F on chronic Coumadin for hx Afib.   10/23/2017:  INR above goal at 4.05 after 0.5 mg given yesterday (1 mg ordered but RN charted pt wanted 0.5 mg)   Reports of dark, tarry stools PTA.  Hg & pltc are low, however this is thought to be related to chemotherapy, not active bleed.    Drug interactions:  Broad-spectrum antibiotics can  increase INR  Goal of Therapy:  INR 2-3  Plan:  No coumadin today Daily INR F/U CBC, s/sx of bleeding   Eudelia Bunch, Pharm.D. [phone] 10/23/2017 10:54 AM

## 2017-10-23 NOTE — Progress Notes (Signed)
PT Cancellation Note  Patient Details Name: Amy White MRN: [id number] DOB: [birth year]   Cancelled Treatment:    Reason Eval/Treat Not Completed: Fatigue/lethargy limiting ability to participate. Pt declined to participate with PT at this time. Will check back another time when schedule allows. Thanks.    Weston Anna, MPT Pager: [phone]

## 2017-10-23 NOTE — Progress Notes (Signed)
Pharmacy Antibiotic Note  Amy White is a 84 y.o. female admitted on 10/22/2017 with febrile neutropenia.  She has metastatic breast CA currently undergoing treatment.   Pharmacy has been consulted for Vancomycin dosing.   PCN allergy noted.  Patient has tolerated cephalosporins in the past (Rocephin, cephalexin). She also has hx RedMan's Syndrome with Vancomycin.    10/23/2017:   Afebrile since admission, reports fever 103 F PTA  Neutropenic ANC 0.7  Scr 1.18> 1.06, Cr Cl ~ 36 ml/min  CXR, UA not suggestive of infection  Plan: Vancomycin 1500mg  IV Q48h - did not tolerate first dose, stopped and benadryl given Run over slower rate to minimize risk of RedMan's Syndrome Cefepime 2 gm IV q12 for FN Target AUC 400-500 Monitor renal function and cx data   Height: 5\' 8"  (172.7 cm) Weight: 158 lb 1.1 oz (71.7 kg) IBW/kg (Calculated) : 63.9  Temp (24hrs), Avg:99.2 F (37.3 C), Min:98.4 F (36.9 C), Max:100 F (37.8 C)  Recent Labs  Lab 10/22/17 1247 10/22/17 1254 10/23/17 0330  WBC 0.9*  --  DUPLICATE REQUEST  1.4*  CREATININE 1.18*  --  1.06*  LATICACIDVEN  --  1.16  --     Estimated Creatinine Clearance: 35.6 mL/min (A) (by C-G formula based on SCr of 1.06 mg/dL (H)).    Allergies  Allergen Reactions  . Morphine And Related Rash  . Crestor [Rosuvastatin] Other (See Comments)    Reaction unknown  . Sulfa Antibiotics Other (See Comments)    Mouth breaks out in sores  . Ciprofloxacin Rash and Other (See Comments)    Rash in the mouth  . Penicillins Hives, Rash and Other (See Comments)    Has patient had a PCN reaction causing immediate rash, facial/tongue/throat swelling, SOB or lightheadedness with hypotension: yes Has patient had a PCN reaction causing severe rash involving mucus membranes or skin necrosis: no Has patient had a PCN reaction that required hospitalization: no Has patient had a PCN reaction occurring within the last 10 years: yes If all of the  above answers are "NO", then may proceed with Cephalosporin use.   . Vancomycin Rash and Other (See Comments)    "Red-man"    Antimicrobials this admission: 5/17 Vanc >>  5/17 Aztreonam >>5/18 5/18 cefepime>>  Dose adjustments this admission:  Microbiology results: 5/17 BCx: ngtd 5/17 UCx:  sent  Thank you for allowing pharmacy to be a part of this patient's care.  Eudelia Bunch, Pharm.D. [phone] 10/23/2017 12:21 PM

## 2017-10-23 NOTE — Progress Notes (Addendum)
Amy White   DOB:[birth year]   XB#:[id number]   NUU#:[id number]  Oncology service   Subjective: I am covering Dr. Marin Olp to see Ms Amy White.  She is a 84 year old female, with metastatic breast cancer, currently on Aromasin and started Verzenio about 2 months ago.  She presented to the hospital with fever, and generalized weakness yesterday, was admitted for neutropenic fever.  Patient states she has been having black watery stool, twice a day, along with epigastric discomfort, abdominal bloating and decreased appetite since she started Verzenio 2 months ago.  She had on and off fever at home for a few days, and came into the emergency room yesterday.  She last saw Dr. Marin Olp on 09/23/2017.   Pt has not had BM since admission, Verzenio has been held since admission (pt refused). She states she has developed some pain at the right side rib cage today, which she contributes to pulled muscle when she tried turn and get up in bed.    Objective:  Vitals:   10/23/17 0530 10/23/17 1405  BP: (!) 148/72 113/69  Pulse: 96 (!) 104  Resp: 18 15  Temp: 99 F (37.2 C) 98.4 F (36.9 C)  SpO2: 100% 97%    Body mass index is 24.03 kg/m.  Intake/Output Summary (Last 24 hours) at 10/23/2017 1754 Last data filed at 10/23/2017 1010 Gross per 24 hour  Intake 360 ml  Output -  Net 360 ml     Sclerae unicteric  Oropharynx clear  No peripheral adenopathy  Lungs clear -- no rales or rhonchi  Heart regular rate and rhythm  Abdomen benign  MSK no focal spinal tenderness, no peripheral edema  Neuro nonfocal   CBG (last 3)  No results for input(s): GLUCAP in the last 72 hours.   Labs:  Lab Results  Component Value Date   WBC 1.4 (LL) 34/74/2595   WBC DUPLICATE REQUEST 63/87/5643   HGB 10.0 (L) 32/95/1884   HGB DUPLICATE REQUEST 16/60/6301   HCT 29.9 (L) 60/03/9322   HCT DUPLICATE REQUEST 55/73/2202   MCV 88.5 54/27/0623   MCV DUPLICATE REQUEST 76/28/3151   PLT 76 (L) 76/16/0737   PLT  DUPLICATE REQUEST 10/62/6948   NEUTROABS 0.7 (L) 10/23/2017   CMP Latest Ref Rng & Units 10/23/2017 10/22/2017 09/23/2017  Glucose 65 - 99 mg/dL 114(H) 102(H) 94  BUN 6 - 20 mg/dL 9 10 20   Creatinine 0.44 - 1.00 mg/dL 1.06(H) 1.18(H) 1.80(H)  Sodium 135 - 145 mmol/L 139 140 144  Potassium 3.5 - 5.1 mmol/L 4.2 4.3 4.4  Chloride 101 - 111 mmol/L 108 108 109(H)  CO2 22 - 32 mmol/L 20(L) 23 29  Calcium 8.9 - 10.3 mg/dL 8.4(L) 8.8(L) 9.8  Total Protein 6.5 - 8.1 g/dL - 6.9 7.0  Total Bilirubin 0.3 - 1.2 mg/dL - 1.9(H) 2.0(H)  Alkaline Phos 38 - 126 U/L - 62 72  AST 15 - 41 U/L - 24 32  ALT 14 - 54 U/L - 17 26     Urine Studies No results for input(s): UHGB, CRYS in the last 72 hours.  Invalid input(s): UACOL, UAPR, USPG, UPH, UTP, UGL, UKET, UBIL, UNIT, UROB, ULEU, UEPI, UWBC, URBC, UBAC, CAST, UCOM, BILUA  Basic Metabolic Panel: Recent Labs  Lab 10/22/17 1247 10/23/17 0330  NA 140 139  K 4.3 4.2  CL 108 108  CO2 23 20*  GLUCOSE 102* 114*  BUN 10 9  CREATININE 1.18* 1.06*  CALCIUM 8.8* 8.4*   GFR Estimated Creatinine Clearance: 35.6  mL/min (A) (by C-G formula based on SCr of 1.06 mg/dL (H)). Liver Function Tests: Recent Labs  Lab 10/22/17 1247  AST 24  ALT 17  ALKPHOS 62  BILITOT 1.9*  PROT 6.9  ALBUMIN 3.8   No results for input(s): LIPASE, AMYLASE in the last 168 hours. No results for input(s): AMMONIA in the last 168 hours. Coagulation profile Recent Labs  Lab 10/20/17 10/22/17 1247 10/23/17 0330  INR 2.3 2.91 4.05*    CBC: Recent Labs  Lab 10/22/17 1247 10/23/17 0330 10/23/17 3329  WBC 0.9* DUPLICATE REQUEST  1.4*  --   NEUTROABS 0.4* PENDING 0.7*  HGB 51.8* DUPLICATE REQUEST  84.1*  --   HCT 66.0* DUPLICATE REQUEST  63.0*  --   MCV 16.0 DUPLICATE REQUEST  10.9  --   PLT 94* DUPLICATE REQUEST  76*  --    Cardiac Enzymes: No results for input(s): CKTOTAL, CKMB, CKMBINDEX, TROPONINI in the last 168 hours. BNP: Invalid input(s):  POCBNP CBG: No results for input(s): GLUCAP in the last 168 hours. D-Dimer No results for input(s): DDIMER in the last 72 hours. Hgb A1c No results for input(s): HGBA1C in the last 72 hours. Lipid Profile No results for input(s): CHOL, HDL, LDLCALC, TRIG, CHOLHDL, LDLDIRECT in the last 72 hours. Thyroid function studies No results for input(s): TSH, T4TOTAL, T3FREE, THYROIDAB in the last 72 hours.  Invalid input(s): FREET3 Anemia work up No results for input(s): VITAMINB12, FOLATE, FERRITIN, TIBC, IRON, RETICCTPCT in the last 72 hours. Microbiology Recent Results (from the past 240 hour(s))  Culture, blood (Routine x 2)     Status: None (Preliminary result)   Collection Time: 10/22/17 12:47 PM  Result Value Ref Range Status   Specimen Description   Final    BLOOD Blood Culture results may not be optimal due to an inadequate volume of blood received in culture bottles Performed at [facility], [place] [address] [place], [place] [address]    Special Requests   Final    BOTTLES DRAWN AEROBIC AND ANAEROBIC Blood Culture results may not be optimal due to an inadequate volume of blood received in culture bottles Performed at [facility] [address] [place], [place] [address]    Culture   Final    NO GROWTH < 24 HOURS Performed at [facility] [address], [place], [place] [address]    Report Status PENDING  Incomplete  Urine culture     Status: Abnormal   Collection Time: 10/22/17  3:07 PM  Result Value Ref Range Status   Specimen Description   Final    URINE, RANDOM Performed at [facility] [address] [place], [place] [address]    Special Requests   Final    NONE Performed at [facility], [place] [address] [place], [place] [address]    Culture MULTIPLE SPECIES PRESENT, SUGGEST RECOLLECTION (A)  Final   Report Status 10/23/2017 FINAL  Final  Culture, blood (Routine x 2)     Status:  None (Preliminary result)   Collection Time: 10/22/17  4:02 PM  Result Value Ref Range Status   Specimen Description   Final    BLOOD RIGHT FOREARM Performed at [facility] [address], [place], [place] [address]    Special Requests   Final    BOTTLES DRAWN AEROBIC AND ANAEROBIC Blood Culture results may not be optimal due to an inadequate volume of blood received in culture bottles  Performed at [facility], [place] [address], [place], [place] [address]    Culture   Final    NO GROWTH < 24 HOURS Performed at [facility] [address], [place], [place] [address]    Report Status PENDING  Incomplete      Studies:  Dg Chest 2 View  Result Date: 10/22/2017 CLINICAL DATA:  Fever, shortness of breath EXAM: CHEST - 2 VIEW COMPARISON:  02/21/2016 FINDINGS: There is mild right middle lobe scarring. There is no focal consolidation. The heart and mediastinal contours are unremarkable. There is a dual lead cardiac pacemaker in satisfactory position. The osseous structures are unremarkable. IMPRESSION: No active cardiopulmonary disease. Electronically Signed   By: Kathreen Devoid   On: 10/22/2017 13:15    Assessment: 84 y.o. with metastatic breast cancer, currently on Aromasin and Verzenio, presented with neutropenic fever.  1.  Neutropenic fever, unclear source of infection 2.  Pancytopenia, secondary to Verzenio 3.  Metastatic breast cancer to bone, ER positive 4.  Atrial fibrillation, on Coumadin 5. HTN   Plan:  -I agree with broad antibiotics for now, she is currently on vancomycin and cefepime.  Chest x-ray was negative.  Cultures are pending. Antibiotics management per primary team -I recommend her to stop Verzenio, due to her neutropenic fever, thrombocytopenia, abdominal discomfort and diarrhea.  She will hold it until she see Dr. Marin Olp next time.  She will need dose reduction or switch to other CDK4/6 inhibitor such as Ibrance which  cause less diarrhea  -continue Aromasin  -Her neutropenia has improved today, ANC up from 0.4 to 0.7, OK to hold GCSF for now, she may be able to recover quickly. But if [place] does not improve, OK to give granix   -please check her stool for OB, to rule out GI bleeding. She is on coumadin and she states she has been having black stool for 2 months  -continue other supportive care -I will f/u as needed. I will inform Dr. Marin Olp  -I discussed with Dr. Algis Liming, I appreciate the excellent care from the hospitalist team    Truitt Merle, MD 10/23/2017  5:54 PM

## 2017-10-23 NOTE — Progress Notes (Signed)
Pt CRITICAL VALUE ALERT  Critical Value:  INR 4.05  Date & Time Notied:  10/23/17 0636  Provider Notified: TRH  Orders Received/Actions taken: NO ACTIONS GIVEN AT THIS TIME

## 2017-10-24 LAB — CBC WITH DIFFERENTIAL/PLATELET
Basophils Absolute: 0 10*3/uL (ref 0.0–0.1)
Basophils Relative: 1 %
Eosinophils Absolute: 0 10*3/uL (ref 0.0–0.7)
Eosinophils Relative: 0 %
HCT: 28.6 % — ABNORMAL LOW (ref 36.0–46.0)
Hemoglobin: 9.6 g/dL — ABNORMAL LOW (ref 12.0–15.0)
Lymphocytes Relative: 35 %
Lymphs Abs: 0.6 10*3/uL — ABNORMAL LOW (ref 0.7–4.0)
MCH: 29.4 pg (ref 26.0–34.0)
MCHC: 33.6 g/dL (ref 30.0–36.0)
MCV: 87.5 fL (ref 78.0–100.0)
Monocytes Absolute: 0.2 10*3/uL (ref 0.1–1.0)
Monocytes Relative: 11 %
Neutro Abs: 1 10*3/uL — ABNORMAL LOW (ref 1.7–7.7)
Neutrophils Relative %: 53 %
Platelets: 70 10*3/uL — ABNORMAL LOW (ref 150–400)
RBC: 3.27 MIL/uL — ABNORMAL LOW (ref 3.87–5.11)
RDW: 18.7 % — ABNORMAL HIGH (ref 11.5–15.5)
WBC: 1.8 10*3/uL — ABNORMAL LOW (ref 4.0–10.5)

## 2017-10-24 LAB — BASIC METABOLIC PANEL
Anion gap: 9 (ref 5–15)
BUN: 10 mg/dL (ref 6–20)
CO2: 20 mmol/L — ABNORMAL LOW (ref 22–32)
Calcium: 8.2 mg/dL — ABNORMAL LOW (ref 8.9–10.3)
Chloride: 106 mmol/L (ref 101–111)
Creatinine, Ser: 1.04 mg/dL — ABNORMAL HIGH (ref 0.44–1.00)
GFR calc Af Amer: 53 mL/min — ABNORMAL LOW (ref >60)
GFR calc non Af Amer: 46 mL/min — ABNORMAL LOW (ref >60)
Glucose, Bld: 107 mg/dL — ABNORMAL HIGH (ref 65–99)
Potassium: 3.7 mmol/L (ref 3.5–5.1)
Sodium: 135 mmol/L (ref 135–145)

## 2017-10-24 LAB — PROTIME-INR
INR: 3.76
Prothrombin Time: 36.9 seconds — ABNORMAL HIGH (ref 11.4–15.2)

## 2017-10-24 MED ORDER — IBUPROFEN 200 MG PO TABS: 600.0000 mg | ORAL_TABLET | Freq: Once | ORAL | Status: DC

## 2017-10-24 NOTE — Evaluation (Signed)
Physical Therapy Evaluation Patient Details Name: Amy White MRN: [id number] DOB: Dec 13, 1925 Today's Date: 10/24/2017   History of Present Illness  84 year old female with PMH of metastatic breast cancer with bone only mets, on combination of Aromasin/Versinio (Dr. Marin Olp, Oncology), paroxysmal A. fib on warfarin, SSS status post permanent pacemaker, hypothyroid, asthma/COPD, GERD, HTN, vertebral fractures (Dr. Christella Noa), chronic diarrhea since initiation of chemotherapy admitted with one-week history of intermittent fevers (up to 103 F at home), malaise, fatigue and admitted for febrile neutropenia.  Clinical Impression  Pt admitted with above diagnosis. Pt currently with functional limitations due to the deficits listed below (see PT Problem List). PT ambulated 200' with RW with no loss of balance. Pt reports some SOB 2* tightness in her distended abdomen, SaO2 97% on room air and HR 106 after ambulation. Pt will benefit from skilled PT to increase their independence and safety with mobility to allow discharge to the venue listed below.       Follow Up Recommendations Home health PT    Equipment Recommendations  None recommended by PT    Recommendations for Other Services       Precautions / Restrictions Precautions Precautions: Fall Precaution Comments: 1 fall in February, no other falls in past 1 year Restrictions Weight Bearing Restrictions: No      Mobility  Bed Mobility Overal bed mobility: Needs Assistance Bed Mobility: Sit to Supine       Sit to supine: Min assist   General bed mobility comments: min A for LEs into bed  Transfers Overall transfer level: Needs assistance Equipment used: Rolling walker (2 wheeled) Transfers: Sit to/from Stand Sit to Stand: Supervision         General transfer comment: supervision for safety  Ambulation/Gait Ambulation/Gait assistance: Supervision Ambulation Distance (Feet): 200 Feet Assistive device: Rolling walker (2  wheeled) Gait Pattern/deviations: Step-through pattern;Decreased stride length     General Gait Details: steady, no loss of balance, SaO2 97% on room air, HR 106, 1/4 dyspnea (pt stated fluid in abdomen makes it hard to breathe)  Stairs            Wheelchair Mobility    Modified Rankin (Stroke Patients Only)       Balance Overall balance assessment: Modified Independent;History of Falls                                           Pertinent Vitals/Pain Pain Assessment: 0-10 Pain Score: 4  Pain Location: abdomen Pain Descriptors / Indicators: Tightness Pain Intervention(s): Limited activity within patient's tolerance;Repositioned    Home Living Family/patient expects to be discharged to:: Private residence Living Arrangements: Alone Available Help at Discharge: Friend(s);Available PRN/intermittently Type of Home: House       Home Layout: One level Home Equipment: [place] - 4 wheels;Shower seat Additional Comments: female friend assists with transportation prn    Prior Function Level of Independence: Independent with assistive device(s)         Comments: started using rollator after fall in Feb with compression fractures, pt stated that PTA she only used it prn     Hand Dominance        Extremity/Trunk Assessment   Upper Extremity Assessment Upper Extremity Assessment: Overall WFL for tasks assessed    Lower Extremity Assessment Lower Extremity Assessment: Overall WFL for tasks assessed    Cervical / Trunk Assessment Cervical / Trunk Assessment: Kyphotic  Communication   Communication: No difficulties  Cognition Arousal/Alertness: Awake/alert Behavior During Therapy: WFL for tasks assessed/performed Overall Cognitive Status: Within Functional Limits for tasks assessed                                        General Comments      Exercises     Assessment/Plan    PT Assessment Patient needs continued PT  services  PT Problem List Decreased activity tolerance       PT Treatment Interventions Gait training;Functional mobility training;Therapeutic exercise;Therapeutic activities    PT Goals (Current goals can be found in the Care Plan section)  Acute Rehab PT Goals Patient Stated Goal: return to independence at home PT Goal Formulation: With patient Time For Goal Achievement: 11/07/17 Potential to Achieve Goals: Good    Frequency Min 3X/week   Barriers to discharge        Co-evaluation               AM-PAC PT "6 Clicks" Daily Activity  Outcome Measure Difficulty turning over in bed (including adjusting bedclothes, sheets and blankets)?: A Little Difficulty moving from lying on back to sitting on the side of the bed? : A Little Difficulty sitting down on and standing up from a chair with arms (e.g., wheelchair, bedside commode, etc,.)?: A Little Help needed moving to and from a bed to chair (including a wheelchair)?: A Little Help needed walking in hospital room?: A Little Help needed climbing 3-5 steps with a railing? : A Little 6 Click Score: 18    End of Session Equipment Utilized During Treatment: Gait belt Activity Tolerance: Patient tolerated treatment well Patient left: with bed alarm set;in bed;with call bell/phone within reach Nurse Communication: Mobility status PT Visit Diagnosis: Pain;History of falling (Z91.81) Pain - part of body: (abdomen)    Time: 1000-1017 PT Time Calculation (min) (ACUTE ONLY): 17 min   Charges:   PT Evaluation $PT Eval Low Complexity: 1 Low     PT G Codes:          Philomena Doheny 10/24/2017, 10:50 AM [phone]

## 2017-10-24 NOTE — Progress Notes (Signed)
[place] for Coumadin Indication: atrial fibrillation  Allergies  Allergen Reactions  . Morphine And Related Rash  . Crestor [Rosuvastatin] Other (See Comments)    Reaction unknown  . Sulfa Antibiotics Other (See Comments)    Mouth breaks out in sores  . Ciprofloxacin Rash and Other (See Comments)    Rash in the mouth  . Penicillins Hives, Rash and Other (See Comments)    Has patient had a PCN reaction causing immediate rash, facial/tongue/throat swelling, SOB or lightheadedness with hypotension: yes Has patient had a PCN reaction causing severe rash involving mucus membranes or skin necrosis: no Has patient had a PCN reaction that required hospitalization: no Has patient had a PCN reaction occurring within the last 10 years: yes If all of the above answers are "NO", then may proceed with Cephalosporin use.   . Vancomycin Rash and Other (See Comments)    "Red-man"    Patient Measurements: Height: 5\' 8"  (172.7 cm) Weight: 158 lb 1.1 oz (71.7 kg) IBW/kg (Calculated) : 63.9  Vital Signs: Temp: 99.5 F (37.5 C) (05/19 0527) Temp Source: Oral (05/19 0527) BP: 131/72 (05/19 0527) Pulse Rate: 110 (05/19 0527)  Labs: Recent Labs    10/22/17 1247 10/23/17 0330 10/24/17 8832  HGB 54.9* DUPLICATE REQUEST  82.6* 9.6*  HCT 41.5* DUPLICATE REQUEST  83.0* 94.0*  PLT 94* DUPLICATE REQUEST  76* 70*  LABPROT 30.2* 39.1* 36.9*  INR 2.91 4.05* 3.76  CREATININE 1.18* 1.06* 1.04*    Estimated Creatinine Clearance: 36.3 mL/min (A) (by C-G formula based on SCr of 1.04 mg/dL (H)).  Medications: PTA Coumadin 2.5mg  daily except 5mg  on Tues/Thurs  Assessment: 84 yo F on chronic Coumadin for hx Afib.   10/24/2017:  INR above goal at 3.76 after dose held yesterday and  0.5 mg given 5/17 (1 mg ordered but RN charted pt wanted 0.5 mg)   Reports of dark, tarry stools PTA.  Hg & pltc are low, however this is thought to be related to chemotherapy, not  active bleed.    Drug interactions:  Broad-spectrum antibiotics can increase INR  Goal of Therapy:  INR 2-3    Plan:  No coumadin today Daily INR F/U CBC, s/sx of bleeding FOBT rec by Rx and [place], Pharm.D. [phone] 10/24/2017 10:54 AM

## 2017-10-24 NOTE — Plan of Care (Signed)
Lab work improving.

## 2017-10-24 NOTE — Progress Notes (Signed)
PROGRESS NOTE   Amy White  ZCH:[id number]    DOB: 14-Aug-1925    DOA: 10/22/2017  PCP: Mayra Neer, MD   I have briefly reviewed patients previous medical records in [facility].  Brief Narrative:  84 year old female with PMH of metastatic breast cancer with bone only mets, on combination of Aromasin/Versinio (Dr. Marin Olp, Oncology), paroxysmal A. fib on warfarin, SSS status post permanent pacemaker, hypothyroid, asthma/COPD, GERD, HTN, vertebral fractures (Dr. Christella Noa), chronic diarrhea since initiation of chemotherapy admitted with one-week history of intermittent fevers (up to 103 F at home), malaise, fatigue and admitted for febrile neutropenia.   Assessment & Plan:   Active Problems:   Breast cancer metastasized to bone [facility])   Atrial fibrillation (HCC)   SSS (sick sinus syndrome) [facility])   Pacemaker - Medtronic Advisa DR MRI model [id number] serial number [id number]   Essential hypertension   Neutropenic fever (HCC)   Febrile neutropenia (HCC)   Neutropenic fever: Pancytopenia on admission with ANC 400.  No obvious source of infection identified.  Blood cultures x2: Remain negative to date.  Urine culture: Seems like a contaminant sample.  Chest x-ray without acute findings.  Had been empirically started on IV aztreonam and vancomycin, now changed to IV cefepime and vancomycin.  Treating supportively and follow cultures.  I discussed with Dr. Burr Medico, Oncology who recommended discontinuing Versinio but continue Aromasin.  Improving.  Still having low-grade fevers but ANC improving.  Pancytopenia: Secondary to metastatic cancer to bone and cancer chemotherapy/Versinio.  ANC up to 1000.  Oncology consultation 5/18 appreciated.  Continue to hold Versinio until outpatient follow-up with Dr. Marin Olp.  Hemoglobin and platelets stable.  Leukopenia improving.  Metastatic breast cancer: Holding chemotherapy medications as indicated above.  Oncology consultation  appreciated.  Paroxysmal atrial fibrillation: Stable.  Not on rate control medications.  Supratherapeutic INR likely related to acute illness.  Pharmacy managing Coumadin and holding again today.  Supratherapeutic INR: INR 3.76 on 5/19.  Coumadin per pharmacy.  Hypothyroid: Continue levothyroxine.  Asthma/COPD: Stable without clinical bronchospasm.  Chronic diarrhea: As per oncology, likely secondary to [place] which patient has stopped taking.  Patient reported to oncology that she had black stools.  In fact patient told me that she had not had a BM since 10/21/2017 until 5/18 afternoon and confirmed by nursing that this was soft brown stools without melena.  Continue to monitor stools and hemoglobin closely.   DVT prophylaxis: Anticoagulated on warfarin. Code Status: Full Family Communication: None at bedside Disposition: DC home pending clinical improvement, may in 1 or 2 days.   Consultants:  Medical oncology.  Procedures:  None  Antimicrobials:  IV aztreonam-discontinued. IV cefepime & vancomycin.   Subjective: Overall feels much better.  Stronger.  Denies any complaints.  No sore throat or diarrhea.  As per RN, patient had soft brown stools last night.  No blood noted.  Low-grade fevers noted.  ROS: As above.  Objective:  Vitals:   10/23/17 2134 10/24/17 0236 10/24/17 0527 10/24/17 1303  BP: 125/67  131/72 122/76  Pulse: 91  (!) 110 (!) 102  Resp: 13  14 15   Temp: (!) 101.4 F (38.6 C) 99.5 F (37.5 C) 99.5 F (37.5 C) 98.7 F (37.1 C)  TempSrc: Oral Oral Oral Oral  SpO2: 95%  96% 96%  Weight:      Height:        Examination:  General exam: Pleasant elderly female, moderately built and nourished sitting up comfortably in chair this morning.  Oral mucosa moist. ENT: Posterior pharyngeal wall unremarkable without acute findings.  No oral thrush appreciated. Respiratory system: Clear to auscultation. Respiratory effort normal.  Stable. Cardiovascular  system: S1 & S2 heard, RRR. No JVD, murmurs, rubs, gallops or clicks. No pedal edema.  Stable. Gastrointestinal system: Abdomen is nondistended, soft and nontender. No organomegaly or masses felt. Normal bowel sounds heard.  Stable. Central nervous system: Alert and oriented. No focal neurological deficits.  Stable Extremities: Symmetric 5 x 5 power. Skin: No rashes, lesions or ulcers Psychiatry: Judgement and insight appear normal. Mood & affect appropriate.     Data Reviewed: I have personally reviewed following labs and imaging studies  CBC: Recent Labs  Lab 10/22/17 1247 10/23/17 0330 10/23/17 0748 10/24/17 1540  WBC 0.9* DUPLICATE REQUEST  1.4*  --  1.8*  NEUTROABS 0.4* PENDING 0.7* 1.0*  HGB 08.6* DUPLICATE REQUEST  76.1*  --  9.6*  HCT 95.0* DUPLICATE REQUEST  93.2*  --  67.1*  MCV 24.5 DUPLICATE REQUEST  80.9  --  98.3  PLT 94* DUPLICATE REQUEST  76*  --  70*   Basic Metabolic Panel: Recent Labs  Lab 10/22/17 1247 10/23/17 0330 10/24/17 0350  NA 140 139 135  K 4.3 4.2 3.7  CL 108 108 106  CO2 23 20* 20*  GLUCOSE 102* 114* 107*  BUN 10 9 10   CREATININE 1.18* 1.06* 1.04*  CALCIUM 8.8* 8.4* 8.2*   Liver Function Tests: Recent Labs  Lab 10/22/17 1247  AST 24  ALT 17  ALKPHOS 62  BILITOT 1.9*  PROT 6.9  ALBUMIN 3.8   Coagulation Profile: Recent Labs  Lab 10/20/17 10/22/17 1247 10/23/17 0330 10/24/17 0350  INR 2.3 2.91 4.05* 3.76     Recent Results (from the past 240 hour(s))  Culture, blood (Routine x 2)     Status: None (Preliminary result)   Collection Time: 10/22/17 12:47 PM  Result Value Ref Range Status   Specimen Description   Final    BLOOD Blood Culture results may not be optimal due to an inadequate volume of blood received in culture bottles Performed at [facility], [place] [address] [place], [place] [address]    Special Requests   Final    BOTTLES DRAWN AEROBIC AND ANAEROBIC Blood Culture results may not be  optimal due to an inadequate volume of blood received in culture bottles Performed at [facility] [facility] [address], [place], [place] [address]    Culture   Final    NO GROWTH 2 DAYS Performed at [place] [address] [place], [place] [address]    Report Status PENDING  Incomplete  Urine culture     Status: Abnormal   Collection Time: 10/22/17  3:07 PM  Result Value Ref Range Status   Specimen Description   Final    URINE, RANDOM Performed at [place] [address], [place], [place] [address]    Special Requests   Final    NONE Performed at [facility], [place] [address], [place], [place] [address]    Culture MULTIPLE SPECIES PRESENT, SUGGEST RECOLLECTION (A)  Final   Report Status 10/23/2017 FINAL  Final  Culture, blood (Routine x 2)     Status: None (Preliminary result)   Collection Time: 10/22/17  4:02 PM  Result Value Ref Range Status   Specimen Description   Final    BLOOD RIGHT FOREARM Performed at [facility]  [address] [place], [place] [address]    Special Requests   Final    BOTTLES DRAWN AEROBIC AND ANAEROBIC Blood Culture results may not be optimal due to an inadequate volume of blood received in culture bottles Performed at [place] [address], [place], [place] [address]    Culture   Final    NO GROWTH 2 DAYS Performed at [place] [address], [place], [place] [address]    Report Status PENDING  Incomplete         Radiology Studies: No results found.      Scheduled Meds: . diphenhydrAMINE  25 mg Intravenous Q48H  . exemestane  25 mg Oral QPC breakfast  . levothyroxine  100 mcg Oral QAC breakfast  . pantoprazole  40 mg Oral Daily  . sodium chloride flush  3 mL Intravenous Q12H  . [START ON 10/25/2017] Vitamin D (Ergocalciferol)  50,000 Units Oral Q7 days  . Warfarin - Pharmacist Dosing Inpatient   Does not  apply q1800   Continuous Infusions: . sodium chloride    . ceFEPime (MAXIPIME) IV Stopped (10/24/17 0550)  . vancomycin Stopped (10/22/17 1936)     LOS: 2 days     Vernell Leep, MD, FACP, [facility]. [facility] Pager [phone]  If 7PM-7AM, please contact night-coverage [email or web address] Password [email or web address] 10/24/2017, 1:43 PM

## 2017-10-25 LAB — PROTIME-INR
INR: 3.86
Prothrombin Time: 37.6 seconds — ABNORMAL HIGH (ref 11.4–15.2)

## 2017-10-25 LAB — CBC
HCT: 29.9 % — ABNORMAL LOW (ref 36.0–46.0)
Hemoglobin: 10 g/dL — ABNORMAL LOW (ref 12.0–15.0)
MCH: 29.6 pg (ref 26.0–34.0)
MCHC: 33.4 g/dL (ref 30.0–36.0)
MCV: 88.5 fL (ref 78.0–100.0)
Platelets: 76 10*3/uL — ABNORMAL LOW (ref 150–400)
RBC: 3.38 MIL/uL — ABNORMAL LOW (ref 3.87–5.11)
RDW: 18.5 % — ABNORMAL HIGH (ref 11.5–15.5)
WBC: 1.4 10*3/uL — CL (ref 4.0–10.5)

## 2017-10-25 LAB — CBC WITH DIFFERENTIAL/PLATELET
Basophils Absolute: 0 10*3/uL (ref 0.0–0.1)
Basophils Relative: 1 %
Eosinophils Absolute: 0 10*3/uL (ref 0.0–0.7)
Eosinophils Relative: 0 %
HCT: 27 % — ABNORMAL LOW (ref 36.0–46.0)
Hemoglobin: 9.3 g/dL — ABNORMAL LOW (ref 12.0–15.0)
Lymphocytes Relative: 33 %
Lymphs Abs: 0.5 10*3/uL — ABNORMAL LOW (ref 0.7–4.0)
MCH: 29.4 pg (ref 26.0–34.0)
MCHC: 34.4 g/dL (ref 30.0–36.0)
MCV: 85.4 fL (ref 78.0–100.0)
Monocytes Absolute: 0.2 10*3/uL (ref 0.1–1.0)
Monocytes Relative: 17 %
Neutro Abs: 0.7 10*3/uL — ABNORMAL LOW (ref 1.7–7.7)
Neutrophils Relative %: 49 %
Platelets: 65 10*3/uL — ABNORMAL LOW (ref 150–400)
RBC: 3.16 MIL/uL — ABNORMAL LOW (ref 3.87–5.11)
RDW: 18.5 % — ABNORMAL HIGH (ref 11.5–15.5)
WBC: 1.5 10*3/uL — ABNORMAL LOW (ref 4.0–10.5)

## 2017-10-25 MED ORDER — ENSURE ENLIVE PO LIQD
237.0000 mL | Freq: Two times a day (BID) | ORAL | Status: DC
  Administered 2017-10-26: 237 mL via ORAL

## 2017-10-25 MED ORDER — ACETAMINOPHEN 325 MG PO TABS: 325.0000 mg | ORAL_TABLET | Freq: Four times a day (QID) | ORAL | Status: DC | PRN

## 2017-10-25 MED ORDER — TBO-FILGRASTIM 480 MCG/0.8ML ~~LOC~~ SOSY
480.0000 ug | PREFILLED_SYRINGE | Freq: Once | SUBCUTANEOUS | Status: AC
  Administered 2017-10-25: 480 ug via SUBCUTANEOUS
  Filled 2017-10-25: qty 0.8

## 2017-10-25 NOTE — Care Management Note (Signed)
Case Management Note  Patient Details  Name: Amy White MRN: [id number] Date of Birth: [birth year]   Pt from home alone and is active with [facility] for HHRN/PT/Aide. Will need resumption orders at discharge.  Expected Discharge Date:  (unknown)               Expected Discharge Plan:     In-House Referral:     Discharge planning Services     Post Acute Care Choice:    Choice offered to:     DME Arranged:    DME Agency:     HH Arranged:    HH Agency:     Status of Service:     If discussed at [employer], dates discussed:    Additional CommentsLynnell Catalan, RN 10/25/2017, 10:15 AM  [phone]

## 2017-10-25 NOTE — Progress Notes (Signed)
Amy White   DOB:[birth year]   HW#:[id number]   VEL#:[id number]  Oncology follow up   Subjective: Pt still feels weak and fatigue, has no good appetite. She has fever again this morning with T102, and chills and sweating, resolved after tylenol. She had a normal bowel movement yesterday, normal color.   Objective:  Vitals:   10/25/17 0454 10/25/17 1435  BP: 112/82 124/77  Pulse: 95 (!) 107  Resp: 18 14  Temp: 98.8 F (37.1 C) 99 F (37.2 C)  SpO2: 96% 97%    Body mass index is 24.03 kg/m.  Intake/Output Summary (Last 24 hours) at 10/25/2017 1717 Last data filed at 10/24/2017 2010 Gross per 24 hour  Intake 700 ml  Output -  Net 700 ml     Sclerae unicteric  Oropharynx clear  No peripheral adenopathy  Lungs clear -- no rales or rhonchi  Heart regular rate and rhythm  Abdomen benign  MSK no focal spinal tenderness, no peripheral edema  Neuro nonfocal   CBG (last 3)  No results for input(s): GLUCAP in the last 72 hours.   Labs:  Lab Results  Component Value Date   WBC 1.5 (L) 10/25/2017   HGB 9.3 (L) 10/25/2017   HCT 27.0 (L) 10/25/2017   MCV 85.4 10/25/2017   PLT 65 (L) 10/25/2017   NEUTROABS 0.7 (L) 10/25/2017   CMP Latest Ref Rng & Units 10/24/2017 10/23/2017 10/22/2017  Glucose 65 - 99 mg/dL 107(H) 114(H) 102(H)  BUN 6 - 20 mg/dL 10 9 10   Creatinine 0.44 - 1.00 mg/dL 1.04(H) 1.06(H) 1.18(H)  Sodium 135 - 145 mmol/L 135 139 140  Potassium 3.5 - 5.1 mmol/L 3.7 4.2 4.3  Chloride 101 - 111 mmol/L 106 108 108  CO2 22 - 32 mmol/L 20(L) 20(L) 23  Calcium 8.9 - 10.3 mg/dL 8.2(L) 8.4(L) 8.8(L)  Total Protein 6.5 - 8.1 g/dL - - 6.9  Total Bilirubin 0.3 - 1.2 mg/dL - - 1.9(H)  Alkaline Phos 38 - 126 U/L - - 62  AST 15 - 41 U/L - - 24  ALT 14 - 54 U/L - - 17     Urine Studies No results for input(s): UHGB, CRYS in the last 72 hours.  Invalid input(s): UACOL, UAPR, USPG, UPH, UTP, UGL, UKET, UBIL, UNIT, UROB, ULEU, UEPI, UWBC, URBC, UBAC, CAST, UCOM,  [place]  Basic Metabolic Panel: Recent Labs  Lab 10/22/17 1247 10/23/17 0330 10/24/17 0350  NA 140 139 135  K 4.3 4.2 3.7  CL 108 108 106  CO2 23 20* 20*  GLUCOSE 102* 114* 107*  BUN 10 9 10   CREATININE 1.18* 1.06* 1.04*  CALCIUM 8.8* 8.4* 8.2*   GFR Estimated Creatinine Clearance: 36.3 mL/min (A) (by C-G formula based on SCr of 1.04 mg/dL (H)). Liver Function Tests: Recent Labs  Lab 10/22/17 1247  AST 24  ALT 17  ALKPHOS 62  BILITOT 1.9*  PROT 6.9  ALBUMIN 3.8   No results for input(s): LIPASE, AMYLASE in the last 168 hours. No results for input(s): AMMONIA in the last 168 hours. Coagulation profile Recent Labs  Lab 10/20/17 10/22/17 1247 10/23/17 0330 10/24/17 0350 10/25/17 0409  INR 2.3 2.91 4.05* 3.76 3.86    CBC: Recent Labs  Lab 10/22/17 1247 10/23/17 0330 10/23/17 0748 10/24/17 0350 10/25/17 0409  WBC 0.9* 1.4*  DUPLICATE REQUEST  --  1.8* 1.5*  NEUTROABS 0.4* PENDING 0.7* 1.0* 0.7*  HGB 25.8* 52.7*  DUPLICATE REQUEST  --  9.6* 9.3*  HCT 32.0*  16.9*  DUPLICATE REQUEST  --  67.8* 27.0*  MCV 93.8 10.1  DUPLICATE REQUEST  --  75.1 85.4  PLT 94* 76*  DUPLICATE REQUEST  --  70* 65*   Cardiac Enzymes: No results for input(s): CKTOTAL, CKMB, CKMBINDEX, TROPONINI in the last 168 hours. BNP: Invalid input(s): POCBNP CBG: No results for input(s): GLUCAP in the last 168 hours. D-Dimer No results for input(s): DDIMER in the last 72 hours. Hgb A1c No results for input(s): HGBA1C in the last 72 hours. Lipid Profile No results for input(s): CHOL, HDL, LDLCALC, TRIG, CHOLHDL, LDLDIRECT in the last 72 hours. Thyroid function studies No results for input(s): TSH, T4TOTAL, T3FREE, THYROIDAB in the last 72 hours.  Invalid input(s): FREET3 Anemia work up No results for input(s): VITAMINB12, FOLATE, FERRITIN, TIBC, IRON, RETICCTPCT in the last 72 hours. Microbiology Recent Results (from the past 240 hour(s))  Culture, blood (Routine x 2)     Status:  None (Preliminary result)   Collection Time: 10/22/17 12:47 PM  Result Value Ref Range Status   Specimen Description   Final    BLOOD Blood Culture results may not be optimal due to an inadequate volume of blood received in culture bottles Performed at [facility], [place] [address], [place], [place] [address]    Special Requests   Final    BOTTLES DRAWN AEROBIC AND ANAEROBIC Blood Culture results may not be optimal due to an inadequate volume of blood received in culture bottles Performed at [place] [address] [place], [place] [address]    Culture   Final    NO GROWTH 3 DAYS Performed at [facility], [place] [address], [place], [place] [address]    Report Status PENDING  Incomplete  Urine culture     Status: Abnormal   Collection Time: 10/22/17  3:07 PM  Result Value Ref Range Status   Specimen Description   Final    URINE, RANDOM Performed at [facility] [address] [place], [place] [address]    Special Requests   Final    NONE Performed at [facility], [place] [address] [place], [place] [address]    Culture MULTIPLE SPECIES PRESENT, SUGGEST RECOLLECTION (A)  Final   Report Status 10/23/2017 FINAL  Final  Culture, blood (Routine x 2)     Status: None (Preliminary result)   Collection Time: 10/22/17  4:02 PM  Result Value Ref Range Status   Specimen Description   Final    BLOOD RIGHT FOREARM Performed at [place] [address] [place], [place] [address]    Special Requests   Final    BOTTLES DRAWN AEROBIC AND ANAEROBIC Blood Culture results may not be optimal due to an inadequate volume of blood received in culture bottles Performed at [place] [address] [place], [place] [address]    Culture   Final    NO GROWTH 3 DAYS Performed at [facility], [place] [address] [place], [place] [address]    Report Status PENDING   Incomplete      Studies:  No results found.  Assessment: 84 y.o. with metastatic breast cancer, currently on Aromasin and Verzenio, presented with neutropenic fever.  1.  Neutropenic fever, unclear source of infection 2.  Pancytopenia, secondary to Verzenio 3.  Metastatic breast cancer to bone, ER positive 4.  Atrial fibrillation, on Coumadin 5. HTN   Plan:  -her fever this morning  felt to be associated with vancomycin infusion, which was subsequently stopped.  She is still on cefepime -Her blood and urine culture remain to be negative so far (urine possible contamination) -her ANC dropped back to 0.7K this morning, given her intermittent fever, I will give her 1 dose of Granix tonight -her diarrhea has resolved since we held [facility], she still has some abdominal discomfort  -I encourage her to get up and sit in chair, and try PT/OT  -I will f/u again tomorrow. Dr. Marin Olp will be back on Wednesday  -I will order iron study for tomorrow morning for her anemia    Truitt Merle, MD 10/25/2017  5:17 PM

## 2017-10-25 NOTE — Progress Notes (Signed)
PROGRESS NOTE   Amy White  EQA:[id number]    DOB: [birth year]    DOA: 10/22/2017  PCP: Mayra Neer, MD   I have briefly reviewed patients previous medical records in [facility].  Brief Narrative:  84 year old female with PMH of metastatic breast cancer with bone only mets, on combination of Aromasin/Versinio (Dr. Marin Olp, Oncology), paroxysmal A. fib on warfarin, SSS status post permanent pacemaker, hypothyroid, asthma/COPD, GERD, HTN, vertebral fractures (Dr. Christella Noa), chronic diarrhea since initiation of chemotherapy admitted with one-week history of intermittent fevers (up to 103 F at home), malaise, fatigue and admitted for febrile neutropenia.   Assessment & Plan:   Active Problems:   Breast cancer metastasized to bone [facility])   Atrial fibrillation (HCC)   SSS (sick sinus syndrome) [facility])   Pacemaker - Medtronic Advisa DR MRI model [id number] serial number [id number]   Essential hypertension   Neutropenic fever (HCC)   Febrile neutropenia (HCC)   Neutropenic fever: Pancytopenia on admission with ANC 400.  No obvious source of infection identified.  Blood cultures x2: Remain negative to date.  Urine culture: Seems like a contaminant sample.  Chest x-ray without acute findings.  Had been empirically started on IV aztreonam and vancomycin, now changed to IV cefepime and vancomycin.  Treating supportively and follow cultures.  I discussed with Dr. Burr Medico, Oncology who recommended discontinuing Versinio but continue Aromasin.  Again developed fever of 102 F 5/19 night and as per extensive discussion with nursing and pharmacy, apparently coincided with timing of IV vancomycin and associated "red man" syndrome.  Since culture negative thus far, discontinued vancomycin.  Continue cefepime alone and monitor closely.  I discussed with dr. Antonieta Pert APP Clarise Cruz (he is out of town until [place]).  Pancytopenia: Secondary to metastatic cancer to bone and cancer chemotherapy/Versinio.  ANC up to  1000.  Oncology consultation 5/18 appreciated.  Continue to hold Versinio until outpatient follow-up with Dr. Marin Olp.  Hemoglobin and platelets, mild and gradual drop.  [place] had improved to 1K but is back down to 0.7.  Follow CBC in a.m.  Metastatic breast cancer: Holding chemotherapy medications as indicated above.  Oncology consultation appreciated.  Discussed with oncology 5/20.  Paroxysmal atrial fibrillation: Stable.  Not on rate control medications.  Supratherapeutic INR likely related to acute illness.  Pharmacy managing Coumadin and holding again today.  Supratherapeutic INR: INR 3.86 on 5/20.  Coumadin per pharmacy.  Hypothyroid: Continue levothyroxine.  Asthma/COPD: Stable without clinical bronchospasm.  Chronic diarrhea: As per oncology, likely secondary to [place] which patient has stopped taking.  Patient reported to oncology that she had black stools.  In fact patient told me that she had not had a BM since 10/21/2017 until 5/18 afternoon and confirmed by nursing that this was soft brown stools without melena.  Continue to monitor stools and hemoglobin closely.   DVT prophylaxis: Anticoagulated on warfarin. Code Status: Full Family Communication: None at bedside Disposition: Not medically ready for discharge yet.   Consultants:  Medical oncology.  Procedures:  None  Antimicrobials:  IV aztreonam-discontinued. IV cefepime & vancomycin.   Subjective: Reports overnight fever of 102 F which broke after Tylenol with drenching sweats.  Denies complaints this morning.  She was concerned regarding recurrence of fever last night.  ROS: As above.  Objective:  Vitals:   10/24/17 2322 10/24/17 2351 10/25/17 0133 10/25/17 0454  BP:    112/82  Pulse:    95  Resp:    18  Temp: (!) 102 F (38.9 C) (!)  100.7 F (38.2 C) 98.3 F (36.8 C) 98.8 F (37.1 C)  TempSrc: Oral Oral Oral Oral  SpO2:    96%  Weight:      Height:        Examination:  General exam: Pleasant  elderly female, moderately built and nourished sitting up comfortably in chair this morning.  Oral mucosa moist.  Does not appear septic or toxic. ENT: Posterior pharyngeal wall unremarkable without acute findings.  No oral thrush appreciated.  Stable. Respiratory system: Clear to auscultation. Respiratory effort normal.  Stable. Cardiovascular system: S1 & S2 heard, RRR. No JVD, murmurs, rubs, gallops or clicks. No pedal edema.  Stable Gastrointestinal system: Abdomen is nondistended, soft and nontender. No organomegaly or masses felt. Normal bowel sounds heard.  Stable Central nervous system: Alert and oriented. No focal neurological deficits.  Stable Extremities: Symmetric 5 x 5 power. Skin: No rashes, lesions or ulcers Psychiatry: Judgement and insight appear normal. Mood & affect appropriate.     Data Reviewed: I have personally reviewed following labs and imaging studies  CBC: Recent Labs  Lab 10/22/17 1247 10/23/17 0330 10/23/17 0748 10/24/17 0350 10/25/17 0409  WBC 0.9* 1.4*  DUPLICATE REQUEST  --  1.8* 1.5*  NEUTROABS 0.4* PENDING 0.7* 1.0* 0.7*  HGB 16.3* 84.5*  DUPLICATE REQUEST  --  9.6* 9.3*  HCT 36.4* 68.0*  DUPLICATE REQUEST  --  32.1* 27.0*  MCV 22.4 82.5  DUPLICATE REQUEST  --  00.3 85.4  PLT 94* 76*  DUPLICATE REQUEST  --  70* 65*   Basic Metabolic Panel: Recent Labs  Lab 10/22/17 1247 10/23/17 0330 10/24/17 0350  NA 140 139 135  K 4.3 4.2 3.7  CL 108 108 106  CO2 23 20* 20*  GLUCOSE 102* 114* 107*  BUN 10 9 10   CREATININE 1.18* 1.06* 1.04*  CALCIUM 8.8* 8.4* 8.2*   Liver Function Tests: Recent Labs  Lab 10/22/17 1247  AST 24  ALT 17  ALKPHOS 62  BILITOT 1.9*  PROT 6.9  ALBUMIN 3.8   Coagulation Profile: Recent Labs  Lab 10/20/17 10/22/17 1247 10/23/17 0330 10/24/17 0350 10/25/17 0409  INR 2.3 2.91 4.05* 3.76 3.86     Recent Results (from the past 240 hour(s))  Culture, blood (Routine x 2)     Status: None (Preliminary result)     Collection Time: 10/22/17 12:47 PM  Result Value Ref Range Status   Specimen Description   Final    BLOOD Blood Culture results may not be optimal due to an inadequate volume of blood received in culture bottles Performed at [facility], [place] [address], [place], [place] [address]    Special Requests   Final    BOTTLES DRAWN AEROBIC AND ANAEROBIC Blood Culture results may not be optimal due to an inadequate volume of blood received in culture bottles Performed at [place] [address] [place], [place] [address]    Culture   Final    NO GROWTH 2 DAYS Performed at [place] [address] [place], [place] [address]    Report Status PENDING  Incomplete  Urine culture     Status: Abnormal   Collection Time: 10/22/17  3:07 PM  Result Value Ref Range Status   Specimen Description   Final    URINE, RANDOM Performed at [place] [address] [place], [place] [address]    Special Requests   Final    NONE Performed at [facility], [place] [address] [place], [place] [address]    Culture MULTIPLE SPECIES PRESENT, SUGGEST RECOLLECTION (A)  Final   Report Status 10/23/2017 FINAL  Final  Culture, blood (Routine x 2)     Status: None (Preliminary result)   Collection Time: 10/22/17  4:02 PM  Result Value Ref Range Status   Specimen Description   Final    BLOOD RIGHT FOREARM Performed at [place] [address], [place], [place] [address]    Special Requests   Final    BOTTLES DRAWN AEROBIC AND ANAEROBIC Blood Culture results may not be optimal due to an inadequate volume of blood received in culture bottles Performed at [place] [address] [place], [place] [address]    Culture   Final    NO GROWTH 2 DAYS Performed at [place] [address] [place], [place] [address]    Report Status PENDING  Incomplete          Radiology Studies: No results found.      Scheduled Meds: . exemestane  25 mg Oral QPC breakfast  . ibuprofen  600 mg Oral Once  . levothyroxine  100 mcg Oral QAC breakfast  . pantoprazole  40 mg Oral Daily  . sodium chloride flush  3 mL Intravenous Q12H  . Vitamin D (Ergocalciferol)  50,000 Units Oral Q7 days  . Warfarin - Pharmacist Dosing Inpatient   Does not apply q1800   Continuous Infusions: . sodium chloride 250 mL (10/25/17 0705)  . ceFEPime (MAXIPIME) IV Stopped (10/25/17 0734)     LOS: 3 days     Vernell Leep, MD, FACP, [facility]. [facility] Pager [phone]  If 7PM-7AM, please contact night-coverage [email or web address] Password [email or web address] 10/25/2017, 1:40 PM

## 2017-10-25 NOTE — Care Management Important Message (Signed)
Important Message  Patient Details  Name: Amy White MRN: [id number] Date of Birth: April 05, 1926   Medicare Important Message Given:  Yes    Kerin Salen 10/25/2017, 12:03 [place] Message  Patient Details  Name: Amy White MRN: [id number] Date of Birth: April 08, 1926   Medicare Important Message Given:  Yes    Kerin Salen 10/25/2017, 12:03 PM

## 2017-10-25 NOTE — Progress Notes (Signed)
[place] for Warfarin Indication: atrial fibrillation  Allergies  Allergen Reactions  . Morphine And Related Rash  . Crestor [Rosuvastatin] Other (See Comments)    Reaction unknown  . Sulfa Antibiotics Other (See Comments)    Mouth breaks out in sores  . Ciprofloxacin Rash and Other (See Comments)    Rash in the mouth  . Penicillins Hives, Rash and Other (See Comments)    Has patient had a PCN reaction causing immediate rash, facial/tongue/throat swelling, SOB or lightheadedness with hypotension: yes Has patient had a PCN reaction causing severe rash involving mucus membranes or skin necrosis: no Has patient had a PCN reaction that required hospitalization: no Has patient had a PCN reaction occurring within the last 10 years: yes If all of the above answers are "NO", then may proceed with Cephalosporin use.   . Vancomycin Rash and Other (See Comments)    "Red-man"    Patient Measurements: Height: 5\' 8"  (172.7 cm) Weight: 158 lb 1.1 oz (71.7 kg) IBW/kg (Calculated) : 63.9  Vital Signs: Temp: 98.8 F (37.1 C) (05/20 0454) Temp Source: Oral (05/20 0454) BP: 112/82 (05/20 0454) Pulse Rate: 95 (05/20 0454)  Labs: Recent Labs    10/22/17 1247 10/23/17 0330 10/24/17 0350 10/25/17 0409  HGB 63.8* 46.6*  DUPLICATE REQUEST 9.6* 9.3*  HCT 59.9* 35.7*  DUPLICATE REQUEST 01.7* 27.0*  PLT 94* 76*  DUPLICATE REQUEST 70* 65*  LABPROT 30.2* 39.1* 36.9* 37.6*  INR 2.91 4.05* 3.76 3.86  CREATININE 1.18* 1.06* 1.04*  --     Estimated Creatinine Clearance: 36.3 mL/min (A) (by C-G formula based on SCr of 1.04 mg/dL (H)).  Medications: PTA Coumadin 2.5mg  daily except 5mg  on Tues/Thurs. Pharmacy assisting with dosing/monitoring while inpatient  Assessment: 84 yo F on chronic Coumadin for hx Afib.   10/25/2017:  INR above goal at 3.9 after dose held 5/19-5/20 and 0.5 mg given 5/17 (1 mg ordered but RN charted pt wanted 0.5 mg)   Reports of  dark, tarry stools PTA.  Hg & plt are low, however this is thought to be related to chemotherapy, not active bleed.    Drug interactions:  Broad-spectrum antibiotics can increase INR  Goal of Therapy:  INR 2-3    Plan:  No coumadin today Daily INR F/U CBC, s/sx of bleeding FOBT rec by Rx and Onc  Theodis Shove, PharmD, BCPS Clinical Pharmacist Pager: [phone] 10/25/17 9:44 AM

## 2017-10-26 ENCOUNTER — Encounter: Payer: Self-pay | Admitting: Cardiovascular Disease

## 2017-10-26 ENCOUNTER — Encounter: Payer: Medicare Other | Admitting: Cardiovascular Disease

## 2017-10-26 LAB — CBC WITH DIFFERENTIAL/PLATELET
Basophils Absolute: 0 10*3/uL (ref 0.0–0.1)
Basophils Relative: 0 %
Eosinophils Absolute: 0 10*3/uL (ref 0.0–0.7)
Eosinophils Relative: 0 %
HCT: 26.2 % — ABNORMAL LOW (ref 36.0–46.0)
Hemoglobin: 9.2 g/dL — ABNORMAL LOW (ref 12.0–15.0)
Lymphocytes Relative: 19 %
Lymphs Abs: 0.5 10*3/uL — ABNORMAL LOW (ref 0.7–4.0)
MCH: 29.4 pg (ref 26.0–34.0)
MCHC: 35.1 g/dL (ref 30.0–36.0)
MCV: 83.7 fL (ref 78.0–100.0)
Monocytes Absolute: 0.2 10*3/uL (ref 0.1–1.0)
Monocytes Relative: 8 %
Neutro Abs: 1.7 10*3/uL (ref 1.7–7.7)
Neutrophils Relative %: 73 %
Platelets: 75 10*3/uL — ABNORMAL LOW (ref 150–400)
RBC: 3.13 MIL/uL — ABNORMAL LOW (ref 3.87–5.11)
RDW: 18.9 % — ABNORMAL HIGH (ref 11.5–15.5)
WBC: 2.4 10*3/uL — ABNORMAL LOW (ref 4.0–10.5)

## 2017-10-26 LAB — IRON AND TIBC
Iron: 31 ug/dL (ref 28–170)
Saturation Ratios: 16 % (ref 10.4–31.8)
TIBC: 199 ug/dL — ABNORMAL LOW (ref 250–450)
UIBC: 168 ug/dL

## 2017-10-26 LAB — FERRITIN: Ferritin: 426 ng/mL — ABNORMAL HIGH (ref 11–307)

## 2017-10-26 LAB — PROTIME-INR
INR: 3.67
Prothrombin Time: 36.2 seconds — ABNORMAL HIGH (ref 11.4–15.2)

## 2017-10-26 NOTE — Progress Notes (Signed)
[place] for Warfarin Indication: atrial fibrillation  Allergies  Allergen Reactions  . Morphine And Related Rash  . Crestor [Rosuvastatin] Other (See Comments)    Reaction unknown  . Sulfa Antibiotics Other (See Comments)    Mouth breaks out in sores  . Ciprofloxacin Rash and Other (See Comments)    Rash in the mouth  . Penicillins Hives, Rash and Other (See Comments)    Has patient had a PCN reaction causing immediate rash, facial/tongue/throat swelling, SOB or lightheadedness with hypotension: yes Has patient had a PCN reaction causing severe rash involving mucus membranes or skin necrosis: no Has patient had a PCN reaction that required hospitalization: no Has patient had a PCN reaction occurring within the last 10 years: yes If all of the above answers are "NO", then may proceed with Cephalosporin use.   . Vancomycin Rash and Other (See Comments)    "Red-man"    Patient Measurements: Height: 5\' 8"  (172.7 cm) Weight: 158 lb 1.1 oz (71.7 kg) IBW/kg (Calculated) : 63.9  Vital Signs: Temp: 99.3 F (37.4 C) (05/21 0544) Temp Source: Oral (05/21 0544) BP: 126/77 (05/21 0544) Pulse Rate: 101 (05/21 0544)  Labs: Recent Labs    10/24/17 0350 10/25/17 0409 10/26/17 0331  HGB 9.6* 9.3* 9.2*  HCT 28.6* 27.0* 26.2*  PLT 70* 65* 75*  LABPROT 36.9* 37.6* 36.2*  INR 3.76 3.86 3.67  CREATININE 1.04*  --   --     Estimated Creatinine Clearance: 36.3 mL/min (A) (by C-G formula based on SCr of 1.04 mg/dL (H)).  Medications: PTA warfarin  Assessment: 84 yo F on chronic Coumadin for hx Afib.   10/26/2017:  INR above goal at 3.7 after dose held 5/18-5/20 and 0.5 mg given 5/17 (1 mg ordered but RN charted pt wanted 0.5 mg)   Reports of dark, tarry stools PTA.  Hg & plt are low but stable. This is thought to be related to chemotherapy, not active bleed.    Drug interactions:  Broad-spectrum antibiotics can increase INR  Goal of  Therapy:  INR 2-3    Plan:  No coumadin today, continue to hold as INR remains elevated Daily INR F/U CBC, s/sx of bleeding  Theodis Shove, PharmD, BCPS Clinical Pharmacist Pager: [phone] 10/26/17 9:44 AM

## 2017-10-26 NOTE — Progress Notes (Signed)
PROGRESS NOTE   Amy White  HKV:[id number]    DOB: April 18, 1926    DOA: 10/22/2017  PCP: Mayra Neer, MD   I have briefly reviewed patients previous medical records in [facility].  Brief Narrative:  84 year old female with PMH of metastatic breast cancer with bone only mets, on combination of Aromasin/Versinio (Dr. Marin Olp, Oncology), paroxysmal A. fib on warfarin, SSS status post permanent pacemaker, hypothyroid, asthma/COPD, GERD, HTN, vertebral fractures (Dr. Christella Noa), chronic diarrhea since initiation of chemotherapy admitted with one-week history of intermittent fevers (up to 103 F at home), malaise, fatigue and admitted for febrile neutropenia.  Slowly improving.  Oncology consulted.   Assessment & Plan:   Active Problems:   Breast cancer metastasized to bone [facility])   Atrial fibrillation (HCC)   SSS (sick sinus syndrome) [facility])   Pacemaker - Medtronic Advisa DR MRI model [id number] serial number [id number]   Essential hypertension   Neutropenic fever (HCC)   Febrile neutropenia (HCC)   Neutropenic fever: Pancytopenia on admission with ANC 400.  No obvious source of infection identified.  Blood cultures x2: Negative to date.  Urine culture: Seems like a contaminant sample.  Chest x-ray without acute findings.  Verzenio held.  Had been empirically started on IV cefepime and vancomycin.  Vancomycin discontinued due to suspected fever from "red man" syndrome and continued negative cultures. Continue cefepime alone and monitor closely.  I discussed with dr. Antonieta Pert APP Clarise Cruz on 5/20 (he is out of town until [place]).  If continues to remain afebrile and neutropenia resolves, could possibly discharge home on 5/22 after review by primary Oncologist.  Antibiotic need at discharge to be determined.  Pancytopenia: Secondary to metastatic cancer to bone and cancer chemotherapy/Verzenio. Continue to hold Versinio until outpatient follow-up with Dr. Marin Olp.  Oncology follow-up 5/20  appreciated.  Patient's [place] which had gone up to 1K had dropped down to 0.7K on 5/20 and got a dose of Granix 5/20.  ANC up to 1.7 today.  Follow CBC in a.m.  Anemia panel reviewed:?  Chronic disease (iron 31, TIBC 199, saturation ratio 16, ferritin 426).  Hemoglobin stable.  Platelets slightly better.  Metastatic breast cancer: Holding chemotherapy medications as indicated above.  Oncology consultation appreciated.  Seen by oncology 5/20.  Paroxysmal atrial fibrillation: Stable.  Not on rate control medications.  Supratherapeutic INR likely related to acute illness.  Pharmacy managing Coumadin.  Supratherapeutic INR: INR 3.67 on 5/21.  Coumadin per pharmacy.  Hypothyroid: Continue levothyroxine.  Asthma/COPD: Stable without clinical bronchospasm.  Chronic diarrhea: As per oncology, likely secondary to [facility] which patient has stopped taking.  Patient reported to oncology that she had black stools.  In fact patient told me that she had not had a BM since 10/21/2017 until 5/18 afternoon and confirmed by nursing that this was soft brown stools without melena.  Continue to monitor stools and hemoglobin closely.  No BM now for 2 days.   DVT prophylaxis: Anticoagulated on warfarin. Code Status: Full Family Communication: None at bedside Disposition: DC home pending further clinical improvement, possibly 5/22.   Consultants:  Medical oncology.  Procedures:  None  Antimicrobials:  IV aztreonam-discontinued. IV cefepime & vancomycin.   Subjective: Feels better.  No further fevers.  No BM in 2 days.  Abdomen feels bloated but no pain, nausea or vomiting.   ROS: As above.  Objective:  Vitals:   10/25/17 0454 10/25/17 1435 10/25/17 2129 10/26/17 0544  BP: 112/82 124/77 121/73 126/77  Pulse: 95 (!) 107 Marland Kitchen)  105 (!) 101  Resp: 18 14 16 14   Temp: 98.8 F (37.1 C) 99 F (37.2 C) 99.8 F (37.7 C) 99.3 F (37.4 C)  TempSrc: Oral Oral Oral Oral  SpO2: 96% 97% 96% 97%  Weight:        Height:        Examination: No significant change in clinical exam compared to 5/20.  Patient was interviewed and examined along with her RN in room.  General exam: Pleasant elderly female, moderately built and nourished sitting up comfortably in chair this morning.  Oral mucosa moist.  Does not appear septic or toxic. ENT: Posterior pharyngeal wall unremarkable without acute findings.  No oral thrush appreciated.  Stable. Respiratory system: Clear to auscultation. Respiratory effort normal.  Stable. Cardiovascular system: S1 & S2 heard, RRR. No JVD, murmurs, rubs, gallops or clicks. No pedal edema.  Stable Gastrointestinal system: Abdomen is nondistended, soft and nontender. No organomegaly or masses felt. Normal bowel sounds heard.  Stable Central nervous system: Alert and oriented. No focal neurological deficits.  Stable Extremities: Symmetric 5 x 5 power. Skin: No rashes, lesions or ulcers Psychiatry: Judgement and insight appear normal. Mood & affect appropriate.     Data Reviewed: I have personally reviewed following labs and imaging studies  CBC: Recent Labs  Lab 10/22/17 1247 10/23/17 0330 10/23/17 0748 10/24/17 0350 10/25/17 0409 10/26/17 0331  WBC 0.9* 1.4*  DUPLICATE REQUEST  --  1.8* 1.5* 2.4*  NEUTROABS 0.4* PENDING 0.7* 1.0* 0.7* 1.7  HGB 21.1* 94.1*  DUPLICATE REQUEST  --  9.6* 9.3* 9.2*  HCT 74.0* 81.4*  DUPLICATE REQUEST  --  48.1* 27.0* 26.2*  MCV 85.6 31.4  DUPLICATE REQUEST  --  97.0 85.4 83.7  PLT 94* 76*  DUPLICATE REQUEST  --  70* 65* 75*   Basic Metabolic Panel: Recent Labs  Lab 10/22/17 1247 10/23/17 0330 10/24/17 0350  NA 140 139 135  K 4.3 4.2 3.7  CL 108 108 106  CO2 23 20* 20*  GLUCOSE 102* 114* 107*  BUN 10 9 10   CREATININE 1.18* 1.06* 1.04*  CALCIUM 8.8* 8.4* 8.2*   Liver Function Tests: Recent Labs  Lab 10/22/17 1247  AST 24  ALT 17  ALKPHOS 62  BILITOT 1.9*  PROT 6.9  ALBUMIN 3.8   Coagulation Profile: Recent Labs   Lab 10/22/17 1247 10/23/17 0330 10/24/17 0350 10/25/17 0409 10/26/17 0331  INR 2.91 4.05* 3.76 3.86 3.67     Recent Results (from the past 240 hour(s))  Culture, blood (Routine x 2)     Status: None (Preliminary result)   Collection Time: 10/22/17 12:47 PM  Result Value Ref Range Status   Specimen Description   Final    BLOOD Blood Culture results may not be optimal due to an inadequate volume of blood received in culture bottles Performed at [facility], [place] [address], [place], [place] [address]    Special Requests   Final    BOTTLES DRAWN AEROBIC AND ANAEROBIC Blood Culture results may not be optimal due to an inadequate volume of blood received in culture bottles Performed at [place] [address] [place], [place] [address]    Culture   Final    NO GROWTH 3 DAYS Performed at [facility], [place] [address], [place], [place] [address]    Report Status PENDING  Incomplete  Urine culture     Status: Abnormal   Collection Time: 10/22/17  3:07 PM  Result  Value Ref Range Status   Specimen Description   Final    URINE, RANDOM Performed at [place] [address], [place], [place] [address]    Special Requests   Final    NONE Performed at [facility], [place] [address] [place], [place] [address]    Culture MULTIPLE SPECIES PRESENT, SUGGEST RECOLLECTION (A)  Final   Report Status 10/23/2017 FINAL  Final  Culture, blood (Routine x 2)     Status: None (Preliminary result)   Collection Time: 10/22/17  4:02 PM  Result Value Ref Range Status   Specimen Description   Final    BLOOD RIGHT FOREARM Performed at [place] [address] [place], [place] [address]    Special Requests   Final    BOTTLES DRAWN AEROBIC AND ANAEROBIC Blood Culture results may not be optimal due to an inadequate volume of blood received in culture bottles Performed at [place] [address], [place], [place] [address]    Culture   Final    NO GROWTH 3 DAYS Performed at [facility], [place] [address] [place], [place] [address]    Report Status PENDING  Incomplete         Radiology Studies: No results found.      Scheduled Meds: . exemestane  25 mg Oral QPC breakfast  . feeding supplement (ENSURE ENLIVE)  237 mL Oral BID BM  . levothyroxine  100 mcg Oral QAC breakfast  . pantoprazole  40 mg Oral Daily  . sodium chloride flush  3 mL Intravenous Q12H  . Vitamin D (Ergocalciferol)  50,000 Units Oral Q7 days  . Warfarin - Pharmacist Dosing Inpatient   Does not apply q1800   Continuous Infusions: . sodium chloride 250 mL (10/25/17 0705)  . ceFEPime (MAXIPIME) IV Stopped (10/26/17 0800)     LOS: 4 days     Vernell Leep, MD, FACP, [facility]. [facility] Pager [phone]  If 7PM-7AM, please contact night-coverage [email or web address] Password [email or web address] 10/26/2017, 1:56 PM

## 2017-10-26 NOTE — Progress Notes (Signed)
PT Cancellation Note  Patient Details Name: Amy White MRN: [id number] DOB: 03-17-1926   Cancelled Treatment:     attempted to see x 2 1. Am requested to rest, stated she will try later 2. Pm just got back to bed from bathroom  Pt has been evaluated with rec for [facility]   Nathanial Rancher 10/26/2017, 4:15 PM

## 2017-10-26 NOTE — Progress Notes (Signed)
Pharmacy Antibiotic Note  Amy White is a 84 y.o. female admitted on 10/22/2017 with febrile neutropenia.  She has metastatic breast CA currently undergoing treatment.   Pharmacy has been consulted for cefepime dosing.   PCN allergy noted.  Patient has tolerated cephalosporins in the past (Rocephin, cephalexin). She also has hx RedMan's Syndrome with vancomycin.   10/26/2017:   Day #5 antibiotics, Afebrile   ANC 1.7  CrCl ~36 mL/min  Plan: Continue cefepime 2 g IV q12h   Height: 5\' 8"  (172.7 cm) Weight: 158 lb 1.1 oz (71.7 kg) IBW/kg (Calculated) : 63.9  Temp (24hrs), Avg:99.4 F (37.4 C), Min:99 F (37.2 C), Max:99.8 F (37.7 C)  Recent Labs  Lab 10/22/17 1247 10/22/17 1254 10/23/17 0330 10/24/17 0350 10/25/17 0409 10/26/17 0331  WBC 0.9*  --  1.4*  DUPLICATE REQUEST 1.8* 1.5* 2.4*  CREATININE 1.18*  --  1.06* 1.04*  --   --   LATICACIDVEN  --  1.16  --   --   --   --     Estimated Creatinine Clearance: 36.3 mL/min (A) (by C-G formula based on SCr of 1.04 mg/dL (H)).    Allergies  Allergen Reactions  . Morphine And Related Rash  . Crestor [Rosuvastatin] Other (See Comments)    Reaction unknown  . Sulfa Antibiotics Other (See Comments)    Mouth breaks out in sores  . Ciprofloxacin Rash and Other (See Comments)    Rash in the mouth  . Penicillins Hives, Rash and Other (See Comments)    Has patient had a PCN reaction causing immediate rash, facial/tongue/throat swelling, SOB or lightheadedness with hypotension: yes Has patient had a PCN reaction causing severe rash involving mucus membranes or skin necrosis: no Has patient had a PCN reaction that required hospitalization: no Has patient had a PCN reaction occurring within the last 10 years: yes If all of the above answers are "NO", then may proceed with Cephalosporin use.   . Vancomycin Rash and Other (See Comments)    "Red-man"    Antimicrobials this admission: 5/17 Vanc >> 5/20 5/17 Aztreonam  >>5/18 5/18 cefepime >>  Dose adjustments this admission:  Microbiology results: 5/17 BCx: no growth 3 days 5/17 UCx:  Multiple species, final  Thank you for allowing pharmacy to be a part of this patient's care.  Theodis Shove, PharmD, BCPS Clinical Pharmacist Pager: [phone] 10/26/17 9:00 AM

## 2017-10-27 DIAGNOSIS — D630 Anemia in neoplastic disease: Secondary | ICD-10-CM

## 2017-10-27 LAB — CBC WITH DIFFERENTIAL/PLATELET
Basophils Absolute: 0 10*3/uL (ref 0.0–0.1)
Basophils Relative: 1 %
Eosinophils Absolute: 0 10*3/uL (ref 0.0–0.7)
Eosinophils Relative: 1 %
HCT: 24.9 % — ABNORMAL LOW (ref 36.0–46.0)
Hemoglobin: 8.8 g/dL — ABNORMAL LOW (ref 12.0–15.0)
Lymphocytes Relative: 21 %
Lymphs Abs: 0.7 10*3/uL (ref 0.7–4.0)
MCH: 29.7 pg (ref 26.0–34.0)
MCHC: 35.3 g/dL (ref 30.0–36.0)
MCV: 84.1 fL (ref 78.0–100.0)
Monocytes Absolute: 0.3 10*3/uL (ref 0.1–1.0)
Monocytes Relative: 10 %
Neutro Abs: 2.3 10*3/uL (ref 1.7–7.7)
Neutrophils Relative %: 67 %
Platelets: 81 10*3/uL — ABNORMAL LOW (ref 150–400)
RBC: 2.96 MIL/uL — ABNORMAL LOW (ref 3.87–5.11)
RDW: 18.8 % — ABNORMAL HIGH (ref 11.5–15.5)
WBC: 3.3 10*3/uL — ABNORMAL LOW (ref 4.0–10.5)

## 2017-10-27 LAB — PROTIME-INR
INR: 3.41
Prothrombin Time: 34.2 seconds — ABNORMAL HIGH (ref 11.4–15.2)

## 2017-10-27 LAB — CULTURE, BLOOD (ROUTINE X 2)
Culture: NO GROWTH
Culture: NO GROWTH

## 2017-10-27 LAB — ABO/RH: ABO/RH(D): O NEG

## 2017-10-27 LAB — PREPARE RBC (CROSSMATCH)

## 2017-10-27 MED ORDER — FERROUS SULFATE 325 (65 FE) MG PO TABS: 325.0000 mg | ORAL_TABLET | Freq: Three times a day (TID) | ORAL | 0 refills | Status: DC

## 2017-10-27 MED ORDER — POLYETHYLENE GLYCOL 3350 17 G PO PACK: 17.0000 g | PACK | Freq: Every day | ORAL | 0 refills | Status: AC | PRN

## 2017-10-27 MED ORDER — ENSURE ENLIVE PO LIQD: 237.0000 mL | Freq: Two times a day (BID) | ORAL | 12 refills | Status: AC

## 2017-10-27 MED ORDER — SODIUM CHLORIDE 0.9 % IV SOLN
Freq: Once | INTRAVENOUS | Status: AC
  Administered 2017-10-27: 15:00:00 via INTRAVENOUS

## 2017-10-27 NOTE — Progress Notes (Signed)
Pt discharged home in stable condition. Discharge instructions given. Script sent to pharmacy of choice. No immediate questions or concerns at this time.  

## 2017-10-27 NOTE — Progress Notes (Signed)
I appreciate everybody's help in my absence for Amy White.  I am very surprised that she became neutropenic.  I think this is a little bit unusual with Versenio.  I did give her a lower dose of Verzenio to start because of her age.  I do not know she is having any type of issues with GI bleeding.  She said that she was having black stool.  I am not sure if this is happening any longer.  Her hemoglobin is dropping.  Her hemoglobin was 8.8.  I think it would be a good idea to transfuse her.  I do not know if she got any IV iron.  Her iron studies were a little bit low that were drawn yesterday.  She is not complaining of any pain.  She has a pretty decent appetite.    Her INR has been on the high side.  Today, her INR is 3.41.  Pharmacy is managing this.  She has had no rashes.  She is had no cough or shortness of breath.  She has had no nausea.  She does have the cardiac issues.  This is definitely a factor that we have to keep in mind with respect to her treatment recommendations for breast cancer.  On her physical exam this morning, her vital signs all look pretty stable.  I really do not hear any atrial fibrillation.  Her blood pressure was 135/80.  She was a little bit tachycardic, likely from the anemia.  I think that she is discharged to home, we will follow-up and try to get her in next week for labs.  Lattie Haw, MD  Ephesians 2:10 .

## 2017-10-27 NOTE — Progress Notes (Signed)
[place] for Warfarin Indication: atrial fibrillation  Allergies  Allergen Reactions  . Morphine And Related Rash  . Crestor [Rosuvastatin] Other (See Comments)    Reaction unknown  . Sulfa Antibiotics Other (See Comments)    Mouth breaks out in sores  . Ciprofloxacin Rash and Other (See Comments)    Rash in the mouth  . Penicillins Hives, Rash and Other (See Comments)    Has patient had a PCN reaction causing immediate rash, facial/tongue/throat swelling, SOB or lightheadedness with hypotension: yes Has patient had a PCN reaction causing severe rash involving mucus membranes or skin necrosis: no Has patient had a PCN reaction that required hospitalization: no Has patient had a PCN reaction occurring within the last 10 years: yes If all of the above answers are "NO", then may proceed with Cephalosporin use.   . Vancomycin Rash and Other (See Comments)    "Red-man"    Patient Measurements: Height: 5\' 8"  (172.7 cm) Weight: 158 lb 1.1 oz (71.7 kg) IBW/kg (Calculated) : 63.9  Vital Signs: Temp: 98.2 F (36.8 C) (05/22 0406) Temp Source: Oral (05/22 0406) BP: 135/80 (05/22 0406) Pulse Rate: 104 (05/22 0406)  Labs: Recent Labs    10/25/17 0409 10/26/17 0331 10/27/17 0358  HGB 9.3* 9.2* 8.8*  HCT 27.0* 26.2* 24.9*  PLT 65* 75* 81*  LABPROT 37.6* 36.2* 34.2*  INR 3.86 3.67 3.41    Estimated Creatinine Clearance: 36.3 mL/min (A) (by C-G formula based on SCr of 1.04 mg/dL (H)).  Medications: PTA warfarin  Assessment: 84 yo F on chronic Coumadin for hx Afib.   10/27/2017:  INR remains above goal at 3.4 despite warfarin being held 5/18-5/21  Of note, patient received warfarin 0.5 mg PO on 5/17 (1 mg ordered but pt requested 0.5 mg only)  Reports of dark, tarry stools PTA.  Hg & plt are low but stable. This is thought to be related to chemotherapy, not active bleed.    Drug interactions:  Broad-spectrum antibiotics can increase  INR  Goal of Therapy:  INR 2-3    Plan:  No warfarin today, continue to hold as INR remains elevated Daily INR F/U CBC, s/sx of bleeding  Theodis Shove, PharmD, BCPS Clinical Pharmacist Pager: [phone] 10/27/17 9:44 AM

## 2017-10-28 ENCOUNTER — Inpatient Hospital Stay: Payer: Medicare Other

## 2017-10-28 ENCOUNTER — Ambulatory Visit (INDEPENDENT_AMBULATORY_CARE_PROVIDER_SITE_OTHER): Payer: Medicare Other | Admitting: Pharmacist Clinician (PhC)/ Clinical Pharmacy Specialist

## 2017-10-28 ENCOUNTER — Inpatient Hospital Stay: Payer: Medicare Other | Admitting: Hematology & Oncology

## 2017-10-28 DIAGNOSIS — I480 Paroxysmal atrial fibrillation: Secondary | ICD-10-CM | POA: Diagnosis not present

## 2017-10-28 DIAGNOSIS — Z7901 Long term (current) use of anticoagulants: Secondary | ICD-10-CM | POA: Insufficient documentation

## 2017-10-28 LAB — POCT INR: INR: 2.6 (ref 2.0–3.0)

## 2017-10-28 LAB — TYPE AND SCREEN
ABO/RH(D): O NEG
Antibody Screen: NEGATIVE
Unit division: 0

## 2017-10-28 LAB — BPAM RBC
Blood Product Expiration Date: 201906212359
ISSUE DATE / TIME: 201905221442
Unit Type and Rh: 9500

## 2017-10-28 NOTE — Discharge Summary (Signed)
[facility] Discharge Summary   Patient: Amy White GMW:[id number]   PCP: Mayra Neer, MD DOB: 1925-10-05   Date of admission: 10/22/2017   Date of discharge: 10/27/2017     Discharge Diagnoses:  Active Problems:   Breast cancer metastasized to bone [facility])   Atrial fibrillation (HCC)   SSS (sick sinus syndrome) [facility])   Pacemaker - Medtronic Advisa DR MRI model [id number] serial number [id number]   Essential hypertension   Neutropenic fever (HCC)   Febrile neutropenia (HCC)   Admitted From: home Disposition:  Home healt  Recommendations for Outpatient Follow-up:  1. Please follow-up with PCP in 1 week, Dr. Marin Olp as recommended.  Follow-up Information    [facility].   Specialty:  Home Health Services Contact information: [address] [place] [address] [phone]        Mayra Neer, MD. Schedule an appointment as soon as possible for a visit in 1 week(s).   Specialty:  Family Medicine Contact information: [address] [place] [address] [phone]          Diet recommendation: Cardiac diet  Activity: The patient is advised to gradually reintroduce usual activities.  Discharge Condition: good  Code Status: full code  History of present illness: As per the H and P dictated on admission, "CINNAMON MORENCY is a 84 y.o. female with medical history significant of stage IV metastatic breast cancer with bony mets, atrial fibrillation not on anticoagulation, sick sinus syndrome status post Medtronic permanent pacemaker, hypothyroidism, asthma who comes in with fever in the setting of neutropenia.  Patient reports she was doing fairly well until she was admitted to the hospital in February with fractures of her vertebral spine.  She was then diagnosed with metastatic breast cancer.  She was started on treatment with oral chemotherapy agents including an aromatase inhibitor and she reported chronic  diarrhea since then.  For the past week she has reported that she has had intermittent fevers and significant malaise and fatigue.  She reports that her fevers have generally been running 100.5-1 01 and generally at night.  She is taken over-the-counter Tylenol for this.  Yesterday her fever spiked at 103.5.  Her home health nurse was called and she was told to present to the emergency department.  She denies any nausea, vomiting, abdominal pain, cough, congestion, rhinorrhea, dysuria, chest pain, orthopnea, lower extremity edema that is new.  She does have chronic diarrhea that was mentioned above but this is been unchanged in quality or quantity."  Hospital Course:  Summary of her active problems in the hospital is as following. Neutropenic fever: Pancytopenia on admission with ANC 400. No obvious source of infection identified. Blood cultures x2: Negative to date. Urine culture: Seems like a contaminant sample. Chest x-ray without acute findings. Verzenio held. Had been empirically started on IV cefepime and vancomycin. Vancomycin discontinued due to suspected fever from "red man" syndrome and continued negative cultures. No further fever and therefore it was felt that the patient does not require antibiotic going forward. Absolute neutrophil count also improved.  Pancytopenia: Anemia, symptomatic, likely acute blood loss from GI tract versus chemotherapy-induced Secondary to metastatic cancer to bone and cancer chemotherapy/Verzenio.  Continue to hold Versinio until outpatient follow-up with Dr. Marin Olp.   Patient got a dose of Granix 5/20. Anemia panel reviewed:?  Chronic disease (iron 31, TIBC 199, saturation ratio 16, ferritin 426).  Hemoglobin stable.  Platelets slightly better. Patient reported severe weakness,  shortness of breath and tachycardia as well as fatigue on exertion. This was probably thought due to development of acute anemia as the patient's hemoglobin recently was around  13 and she presented with hemoglobin around 9 which is a significant drop. As the patient is overly symptomatic, we will transfuse the patient 1 unit of PRBC before discharge. Patient reported black-colored bowel movement prior to admission but she had 2 bowel movements which are brown in color and there were no active bleeding reported and her H&H remained stable and therefore it was felt that no further work-up at present is necessary. Repeat CBC on follow-up with oncology.  Metastatic breast cancer: Holding chemotherapy medications as indicated above.  Oncology consultation appreciated.  Seen by oncology 5/20.  Paroxysmal atrial fibrillation: Stable.  Not on rate control medications.  Supratherapeutic INR likely related to acute illness and poor p.o. intake. Patient is able to tolerate oral diet and reports that she is not liking food in the hospital. Expect the patient to resume oral diet at home and therefore expect correction of the supra therapeutic INR rapidly at home and therefore recommend continuing home regimen for now.  Also stopping the antibiotic will also help in correcting the INR.  Supratherapeutic INR: see above.  Hypothyroid: Continue levothyroxine.  Asthma/COPD: Stable without clinical bronchospasm.  Chronic diarrhea: As per oncology, likely secondary to [facility] which patient has stopped taking.  Patient reported to oncology that she had black stools.  In fact patient told me that she had not had a BM since 10/21/2017 until 5/18 afternoon and confirmed by nursing that this was soft brown stools without melena.   All other chronic medical condition were stable during the hospitalization.  Patient was seen by physical therapy, who recommended SNF, which was arranged by Education officer, museum and case Freight forwarder. On the day of the discharge the patient's vitals were stable , and no other acute medical condition were reported by patient. the patient was felt safe to be discharge at  home with home health.  Consultants: oncology Procedures: none  DISCHARGE MEDICATION: Allergies as of 10/27/2017      Reactions   Morphine And Related Rash   Crestor [rosuvastatin] Other (See Comments)   Reaction unknown   Sulfa Antibiotics Other (See Comments)   Mouth breaks out in sores   Ciprofloxacin Rash, Other (See Comments)   Rash in the mouth   Penicillins Hives, Rash, Other (See Comments)   Has patient had a PCN reaction causing immediate rash, facial/tongue/throat swelling, SOB or lightheadedness with hypotension: yes Has patient had a PCN reaction causing severe rash involving mucus membranes or skin necrosis: no Has patient had a PCN reaction that required hospitalization: no Has patient had a PCN reaction occurring within the last 10 years: yes If all of the above answers are "NO", then may proceed with Cephalosporin use.   Vancomycin Rash, Other (See Comments)   "Red-man"      Medication List    STOP taking these medications   abemaciclib 100 MG tablet Commonly known as:  VERZENIO     TAKE these medications   acetaminophen 500 MG tablet Commonly known as:  TYLENOL Take 500 mg by mouth every 6 (six) hours as needed for fever.   albuterol 108 (90 Base) MCG/ACT inhaler Commonly known as:  PROVENTIL HFA;VENTOLIN HFA Inhale 2 puffs into the lungs every 6 (six) hours as needed for wheezing or shortness of breath.   ALPRAZolam 0.5 MG tablet Commonly known as:  XANAX Take  0.25 mg by mouth 2 (two) times daily as needed for anxiety.   diphenoxylate-atropine 2.5-0.025 MG tablet Commonly known as:  LOMOTIL Take one tablet after each loose stool. Maximum of 8 tablets per day. What changed:    how much to take  how to take this  when to take this  reasons to take this  additional instructions   exemestane 25 MG tablet Commonly known as:  AROMASIN Take 1 tablet (25 mg total) by mouth daily after breakfast.   feeding supplement (ENSURE ENLIVE) Liqd Take  237 mLs by mouth 2 (two) times daily between meals.   ferrous sulfate 325 (65 FE) MG tablet Take 1 tablet (325 mg total) by mouth 3 (three) times daily with meals.   HYDROcodone-acetaminophen 5-325 MG tablet Commonly known as:  NORCO/VICODIN Take 1 tablet by mouth every 6 (six) hours as needed (For pain.).   levothyroxine 100 MCG tablet Commonly known as:  SYNTHROID, LEVOTHROID Take 100 mcg by mouth daily before breakfast.   oxyCODONE-acetaminophen 5-325 MG tablet Commonly known as:  PERCOCET/ROXICET Take 1 tablet by mouth every 4 (four) hours as needed for severe pain (For pain.).   pantoprazole 40 MG tablet Commonly known as:  PROTONIX Take 1 tablet (40 mg total) by mouth daily. What changed:    when to take this  reasons to take this   polyethylene glycol packet Commonly known as:  MIRALAX / GLYCOLAX Take 17 g by mouth daily as needed for mild constipation.   tetrahydrozoline-zinc 0.05-0.25 % ophthalmic solution Commonly known as:  VISINE-AC Place 2 drops into both eyes 3 (three) times daily as needed (red eyes).   Vitamin D (Ergocalciferol) 50000 units Caps capsule Commonly known as:  DRISDOL TAKE 1 CAPSULE BY MOUTH 1 TIME A WEEK What changed:  See the new instructions.   warfarin 5 MG tablet Commonly known as:  COUMADIN Take 2.5-5 mg by mouth every evening. Take one tablet on Tuesday and Thursday and half a tablet the rest of the week.      Allergies  Allergen Reactions  . Morphine And Related Rash  . Crestor [Rosuvastatin] Other (See Comments)    Reaction unknown  . Sulfa Antibiotics Other (See Comments)    Mouth breaks out in sores  . Ciprofloxacin Rash and Other (See Comments)    Rash in the mouth  . Penicillins Hives, Rash and Other (See Comments)    Has patient had a PCN reaction causing immediate rash, facial/tongue/throat swelling, SOB or lightheadedness with hypotension: yes Has patient had a PCN reaction causing severe rash involving mucus  membranes or skin necrosis: no Has patient had a PCN reaction that required hospitalization: no Has patient had a PCN reaction occurring within the last 10 years: yes If all of the above answers are "NO", then may proceed with Cephalosporin use.   . Vancomycin Rash and Other (See Comments)    "Red-man"   Discharge Instructions    Diet - low sodium heart healthy   Complete by:  As directed    Increase activity slowly   Complete by:  As directed      Discharge Exam: Filed Weights   10/22/17 1220 10/22/17 1802  Weight: 73.9 kg (163 lb) 71.7 kg (158 lb 1.1 oz)   Vitals:   10/27/17 1504 10/27/17 1801  BP: (!) 155/82 135/74  Pulse: (!) 102 (!) 107  Resp: 18 20  Temp: 98.2 F (36.8 C) 98.9 F (37.2 C)  SpO2: 99% 98%   General: Appear  in no distress, no Rash; Oral Mucosa moist. Cardiovascular: S1 and S2 Present, no Murmur, no JVD Respiratory: Bilateral Air entry present and Clear to Auscultation, no Crackles, no wheezes Abdomen: Bowel Sound present, Soft and no tenderness Extremities: no Pedal edema, no calf tenderness Neurology: Grossly no focal neuro deficit.  The results of significant diagnostics from this hospitalization (including imaging, microbiology, ancillary and laboratory) are listed below for reference.    Significant Diagnostic Studies: Dg Chest 2 View  Result Date: 10/22/2017 CLINICAL DATA:  Fever, shortness of breath EXAM: CHEST - 2 VIEW COMPARISON:  02/21/2016 FINDINGS: There is mild right middle lobe scarring. There is no focal consolidation. The heart and mediastinal contours are unremarkable. There is a dual lead cardiac pacemaker in satisfactory position. The osseous structures are unremarkable. IMPRESSION: No active cardiopulmonary disease. Electronically Signed   By: Kathreen Devoid   On: 10/22/2017 13:15    Microbiology: Recent Results (from the past 240 hour(s))  Culture, blood (Routine x 2)     Status: None   Collection Time: 10/22/17 12:47 PM  Result  Value Ref Range Status   Specimen Description   Final    BLOOD Blood Culture results may not be optimal due to an inadequate volume of blood received in culture bottles Performed at [facility], [place] [address], [place], [place] [address]    Special Requests   Final    BOTTLES DRAWN AEROBIC AND ANAEROBIC Blood Culture results may not be optimal due to an inadequate volume of blood received in culture bottles Performed at [place] [address] [place], [place] [address]    Culture   Final    NO GROWTH 5 DAYS Performed at [facility], [place] [address], [place], [place] [address]    Report Status 10/27/2017 FINAL  Final  Urine culture     Status: Abnormal   Collection Time: 10/22/17  3:07 PM  Result Value Ref Range Status   Specimen Description   Final    URINE, RANDOM Performed at [facility] [address] [place], [place] [address]    Special Requests   Final    NONE Performed at [facility], [place] [address] [place], [place] [address]    Culture MULTIPLE SPECIES PRESENT, SUGGEST RECOLLECTION (A)  Final   Report Status 10/23/2017 FINAL  Final  Culture, blood (Routine x 2)     Status: None   Collection Time: 10/22/17  4:02 PM  Result Value Ref Range Status   Specimen Description   Final    BLOOD RIGHT FOREARM Performed at [facility] [address] [place], [place] [address]    Special Requests   Final    BOTTLES DRAWN AEROBIC AND ANAEROBIC Blood Culture results may not be optimal due to an inadequate volume of blood received in culture bottles Performed at [facility] [address] [place], [place] [address]    Culture   Final    NO GROWTH 5 DAYS Performed at [facility], [place] [address], [place],  [address]    Report Status 10/27/2017 FINAL  Final     Labs: CBC: Recent Labs  Lab 10/23/17 0330 10/23/17 0748  10/24/17 0350 10/25/17 0409 10/26/17 0331 10/27/17 3875  WBC 1.4*  DUPLICATE REQUEST  --  1.8* 1.5* 2.4* 3.3*  NEUTROABS PENDING 0.7* 1.0* 0.7* 1.7 2.3  HGB 64.3*  DUPLICATE REQUEST  --  9.6* 9.3* 9.2* 8.8*  HCT 54.5*  DUPLICATE REQUEST  --  62.5* 27.0* 26.2* 63.8*  MCV 93.7  DUPLICATE REQUEST  --  34.2 85.4 83.7 87.6  PLT 76*  DUPLICATE REQUEST  --  70* 65* 75* 81*   Basic Metabolic Panel: Recent Labs  Lab 10/22/17 1247 10/23/17 0330 10/24/17 0350  NA 140 139 135  K 4.3 4.2 3.7  CL 108 108 106  CO2 23 20* 20*  GLUCOSE 102* 114* 107*  BUN 10 9 10   CREATININE 1.18* 1.06* 1.04*  CALCIUM 8.8* 8.4* 8.2*   Liver Function Tests: Recent Labs  Lab 10/22/17 1247  AST 24  ALT 17  ALKPHOS 62  BILITOT 1.9*  PROT 6.9  ALBUMIN 3.8   No results for input(s): LIPASE, AMYLASE in the last 168 hours. No results for input(s): AMMONIA in the last 168 hours. Cardiac Enzymes: No results for input(s): CKTOTAL, CKMB, CKMBINDEX, TROPONINI in the last 168 hours. BNP (last 3 results) No results for input(s): BNP in the last 8760 hours. CBG: No results for input(s): GLUCAP in the last 168 hours. Time spent: 35 minutes  Signed:  Berle Mull  [facility] 10/27/2017 , 8:26 AM

## 2017-10-29 ENCOUNTER — Other Ambulatory Visit: Payer: Self-pay | Admitting: Hematology & Oncology

## 2017-10-29 DIAGNOSIS — E559 Vitamin D deficiency, unspecified: Secondary | ICD-10-CM

## 2017-10-29 LAB — CUP PACEART REMOTE DEVICE CHECK
Battery Remaining Longevity: 74 mo
Battery Voltage: 3 V
Brady Statistic AP VP Percent: 0.19 %
Brady Statistic AP VS Percent: 0.05 %
Brady Statistic AS VP Percent: 2.81 %
Brady Statistic AS VS Percent: 96.95 %
Brady Statistic RA Percent Paced: 0.14 %
Brady Statistic RV Percent Paced: 2.37 %
Date Time Interrogation Session: 20190507152923
Implantable Lead Implant Date: 20160223
Implantable Lead Implant Date: 20160223
Implantable Lead Location: 753859
Implantable Lead Location: 753860
Implantable Lead Model: 5076
Implantable Lead Model: 5076
Implantable Pulse Generator Implant Date: 20160223
Lead Channel Impedance Value: 323 Ohm
Lead Channel Impedance Value: 399 Ohm
Lead Channel Impedance Value: 418 Ohm
Lead Channel Impedance Value: 475 Ohm
Lead Channel Pacing Threshold Amplitude: 0.75 V
Lead Channel Pacing Threshold Amplitude: 0.875 V
Lead Channel Pacing Threshold Pulse Width: 0.4 ms
Lead Channel Pacing Threshold Pulse Width: 0.4 ms
Lead Channel Sensing Intrinsic Amplitude: 0.75 mV
Lead Channel Sensing Intrinsic Amplitude: 0.75 mV
Lead Channel Sensing Intrinsic Amplitude: 10 mV
Lead Channel Sensing Intrinsic Amplitude: 10 mV
Lead Channel Setting Pacing Amplitude: 1.5 V
Lead Channel Setting Pacing Amplitude: 2.25 V
Lead Channel Setting Pacing Pulse Width: 0.4 ms
Lead Channel Setting Sensing Sensitivity: 0.9 mV

## 2017-11-03 ENCOUNTER — Ambulatory Visit: Payer: Medicare Other

## 2017-11-03 ENCOUNTER — Other Ambulatory Visit: Payer: Self-pay | Admitting: *Deleted

## 2017-11-03 ENCOUNTER — Other Ambulatory Visit: Payer: Self-pay

## 2017-11-03 ENCOUNTER — Inpatient Hospital Stay: Payer: Medicare Other | Attending: Hematology & Oncology

## 2017-11-03 ENCOUNTER — Inpatient Hospital Stay (HOSPITAL_BASED_OUTPATIENT_CLINIC_OR_DEPARTMENT_OTHER): Payer: Medicare Other | Admitting: Hematology & Oncology

## 2017-11-03 VITALS — BP 134/84 | HR 72 | Temp 98.1°F | Resp 20 | Wt 160.8 lb

## 2017-11-03 DIAGNOSIS — C7951 Secondary malignant neoplasm of bone: Secondary | ICD-10-CM | POA: Diagnosis not present

## 2017-11-03 DIAGNOSIS — Z7901 Long term (current) use of anticoagulants: Secondary | ICD-10-CM | POA: Diagnosis not present

## 2017-11-03 DIAGNOSIS — M549 Dorsalgia, unspecified: Secondary | ICD-10-CM | POA: Diagnosis not present

## 2017-11-03 DIAGNOSIS — Z79899 Other long term (current) drug therapy: Secondary | ICD-10-CM | POA: Insufficient documentation

## 2017-11-03 DIAGNOSIS — C50911 Malignant neoplasm of unspecified site of right female breast: Secondary | ICD-10-CM

## 2017-11-03 DIAGNOSIS — R600 Localized edema: Secondary | ICD-10-CM | POA: Diagnosis not present

## 2017-11-03 DIAGNOSIS — Z170 Estrogen receptor positive status [ER+]: Secondary | ICD-10-CM | POA: Diagnosis not present

## 2017-11-03 DIAGNOSIS — I4891 Unspecified atrial fibrillation: Secondary | ICD-10-CM

## 2017-11-03 DIAGNOSIS — C50919 Malignant neoplasm of unspecified site of unspecified female breast: Secondary | ICD-10-CM | POA: Insufficient documentation

## 2017-11-03 DIAGNOSIS — Z79811 Long term (current) use of aromatase inhibitors: Secondary | ICD-10-CM | POA: Diagnosis not present

## 2017-11-03 DIAGNOSIS — M791 Myalgia, unspecified site: Secondary | ICD-10-CM | POA: Diagnosis not present

## 2017-11-03 LAB — CMP (CANCER CENTER ONLY)
ALT: 14 U/L (ref 0–55)
AST: 25 U/L (ref 5–34)
Albumin: 3.5 g/dL (ref 3.5–5.0)
Alkaline Phosphatase: 85 U/L (ref 40–150)
Anion gap: 11 (ref 3–11)
BUN: 8 mg/dL (ref 7–26)
CO2: 25 mmol/L (ref 22–29)
Calcium: 9.1 mg/dL (ref 8.4–10.4)
Chloride: 106 mmol/L (ref 98–109)
Creatinine: 0.87 mg/dL (ref 0.60–1.10)
GFR, Est AFR Am: >60 mL/min (ref >60)
GFR, Estimated: 57 mL/min — ABNORMAL LOW (ref >60)
Glucose, Bld: 96 mg/dL (ref 70–140)
Potassium: 3.8 mmol/L (ref 3.5–5.1)
Sodium: 142 mmol/L (ref 136–145)
Total Bilirubin: 2.2 mg/dL — ABNORMAL HIGH (ref 0.2–1.2)
Total Protein: 6.8 g/dL (ref 6.4–8.3)

## 2017-11-03 LAB — CBC WITH DIFFERENTIAL (CANCER CENTER ONLY)
Basophils Absolute: 0.1 10*3/uL (ref 0.0–0.1)
Basophils Relative: 1 %
Eosinophils Absolute: 0.1 10*3/uL (ref 0.0–0.5)
Eosinophils Relative: 2 %
HCT: 32.9 % — ABNORMAL LOW (ref 34.8–46.6)
Hemoglobin: 11.1 g/dL — ABNORMAL LOW (ref 11.6–15.9)
Lymphocytes Relative: 25 %
Lymphs Abs: 1.1 10*3/uL (ref 0.9–3.3)
MCH: 30.4 pg (ref 26.0–34.0)
MCHC: 33.7 g/dL (ref 32.0–36.0)
MCV: 90.1 fL (ref 81.0–101.0)
Monocytes Absolute: 0.6 10*3/uL (ref 0.1–0.9)
Monocytes Relative: 12 %
Neutro Abs: 2.7 10*3/uL (ref 1.5–6.5)
Neutrophils Relative %: 60 %
Platelet Count: 188 10*3/uL (ref 145–400)
RBC: 3.65 MIL/uL — ABNORMAL LOW (ref 3.70–5.32)
RDW: 20.4 % — ABNORMAL HIGH (ref 11.1–15.7)
WBC Count: 4.5 10*3/uL (ref 3.9–10.0)

## 2017-11-03 MED ORDER — RIBOCICLIB SUCC (400 MG DOSE) 200 MG PO TBPK: 400.0000 mg | ORAL_TABLET | Freq: Every day | ORAL | 3 refills | Status: DC

## 2017-11-03 NOTE — Progress Notes (Signed)
Hematology and Oncology Follow Up Visit  Amy White [id number] 1926/06/08 84 y.o. 11/03/2017   Principle Diagnosis:   Metastatic breast cancer-bone only metastasis  Current Therapy:    Aromasin 25 mg p.o. Daily  Ribociclib 400 mg po q day (21/7)  Xgeva 120 mg sq q 3 months - next dose 12/2017      Interim History:  Amy White is back for follow-up.  Unfortunately, she was hospitalized because of neutropenia.  This probably was from the Versenio that she was on.  I am absolutely surprised that the [place] would have caused this.  She did not even get full dose Verzenio.  I think that we probably are going to had to make a change.  I will see if she does well with ribociclib.  I talked to her about ribociclib.  I would start her off at 400 mg daily.  She will take this for 21 days on and 7 days off.  She is having problems with her atrial fibrillation.  She sees her cardiologist tomorrow.  May be, she can have the heart shocked back into rhythm.  She seems to be eating okay.  She is had no nausea or vomiting.  She has had no cough.  She is had some slight leg swelling.  I think the leg swelling is probably from her atrial fibrillation.  Overall, her performance status is ECOG 2.   Medications:  Current Outpatient Medications:  .  acetaminophen (TYLENOL) 500 MG tablet, Take 500 mg by mouth every 6 (six) hours as needed for fever., Disp: , Rfl:  .  albuterol (PROVENTIL HFA;VENTOLIN HFA) 108 (90 BASE) MCG/ACT inhaler, Inhale 2 puffs into the lungs every 6 (six) hours as needed for wheezing or shortness of breath., Disp: , Rfl:  .  ALPRAZolam (XANAX) 0.5 MG tablet, Take 0.25 mg by mouth 2 (two) times daily as needed for anxiety. , Disp: , Rfl: 0 .  diphenoxylate-atropine (LOMOTIL) 2.5-0.025 MG tablet, Take one tablet after each loose stool. Maximum of 8 tablets per day. (Patient taking differently: Take 1 tablet by mouth 4 (four) times daily as needed for diarrhea or loose stools.  Maximum of 8 tablets per day.), Disp: 30 tablet, Rfl: 1 .  exemestane (AROMASIN) 25 MG tablet, Take 1 tablet (25 mg total) by mouth daily after breakfast., Disp: 30 tablet, Rfl: 12 .  feeding supplement, ENSURE ENLIVE, (ENSURE ENLIVE) LIQD, Take 237 mLs by mouth 2 (two) times daily between meals., Disp: 237 mL, Rfl: 12 .  HYDROcodone-acetaminophen (NORCO/VICODIN) 5-325 MG tablet, Take 1 tablet by mouth every 6 (six) hours as needed (For pain.). , Disp: , Rfl: 0 .  levothyroxine (SYNTHROID, LEVOTHROID) 100 MCG tablet, Take 100 mcg by mouth daily before breakfast. , Disp: , Rfl: 3 .  pantoprazole (PROTONIX) 40 MG tablet, Take 1 tablet (40 mg total) by mouth daily. (Patient taking differently: Take 40 mg by mouth daily as needed (For heartburn or acid reflux.). ), Disp: 30 tablet, Rfl: 4 .  polyethylene glycol (MIRALAX / GLYCOLAX) packet, Take 17 g by mouth daily as needed for mild constipation., Disp: 14 each, Rfl: 0 .  tetrahydrozoline-zinc (VISINE-AC) 0.05-0.25 % ophthalmic solution, Place 2 drops into both eyes 3 (three) times daily as needed (red eyes)., Disp: , Rfl:  .  Vitamin D, Ergocalciferol, (DRISDOL) 50000 units CAPS capsule, TAKE 1 CAPSULE BY MOUTH 1 TIME A WEEK, Disp: 12 capsule, Rfl: 3 .  warfarin (COUMADIN) 5 MG tablet, Take 2.5-5 mg by mouth  every evening. Take one tablet on Tuesday and Thursday and half a tablet the rest of the week., Disp: , Rfl:  .  oxyCODONE-acetaminophen (PERCOCET/ROXICET) 5-325 MG tablet, Take 1 tablet by mouth every 4 (four) hours as needed for severe pain (For pain.). , Disp: , Rfl:  .  Ribociclib Succinate 400 Dose 200 MG TBPK, Take 400 mg by mouth daily. Take 2 pills daily for 21 days on and 7 days off, Disp: 42 each, Rfl: 3  Allergies:  Allergies  Allergen Reactions  . Morphine And Related Rash  . Crestor [Rosuvastatin] Other (See Comments)    Reaction unknown  . Sulfa Antibiotics Other (See Comments)    Mouth breaks out in sores  . Ciprofloxacin Rash  and Other (See Comments)    Rash in the mouth  . Penicillins Hives, Rash and Other (See Comments)    Has patient had a PCN reaction causing immediate rash, facial/tongue/throat swelling, SOB or lightheadedness with hypotension: yes Has patient had a PCN reaction causing severe rash involving mucus membranes or skin necrosis: no Has patient had a PCN reaction that required hospitalization: no Has patient had a PCN reaction occurring within the last 10 years: yes If all of the above answers are "NO", then may proceed with Cephalosporin use.   . Vancomycin Rash and Other (See Comments)    "Red-man"    Past Medical History, Surgical history, Social history, and Family History were reviewed and updated.  Review of Systems: Review of Systems  Constitutional: Negative.   HENT:  Negative.   Eyes: Negative.   Respiratory: Negative.   Cardiovascular: Negative.   Gastrointestinal: Negative.   Endocrine: Negative.   Genitourinary: Negative.    Musculoskeletal: Positive for back pain and myalgias.  Skin: Negative.   Neurological: Negative.   Hematological: Negative.   Psychiatric/Behavioral: Negative.     Physical Exam:  weight is 160 lb 12 oz (72.9 kg). Her oral temperature is 98.1 F (36.7 C). Her blood pressure is 134/84 and her pulse is 72. Her respiration is 20 and oxygen saturation is 98%.   Wt Readings from Last 3 Encounters:  11/03/17 160 lb 12 oz (72.9 kg)  10/22/17 158 lb 1.1 oz (71.7 kg)  10/06/17 163 lb (73.9 kg)    Physical Exam  Constitutional: She is oriented to person, place, and time.  HENT:  Head: Normocephalic and atraumatic.  Mouth/Throat: Oropharynx is clear and moist.  Eyes: Pupils are equal, round, and reactive to light. EOM are normal.  Neck: Normal range of motion.  Cardiovascular: Normal rate, regular rhythm and normal heart sounds.  Pulmonary/Chest: Effort normal and breath sounds normal.  Abdominal: Soft. Bowel sounds are normal.  Musculoskeletal:  Normal range of motion. She exhibits no edema, tenderness or deformity.  Lymphadenopathy:    She has no cervical adenopathy.  Neurological: She is alert and oriented to person, place, and time.  Skin: Skin is warm and dry. No rash noted. No erythema.  Psychiatric: She has a normal mood and affect. Her behavior is normal. Judgment and thought content normal.  Vitals reviewed.    Lab Results  Component Value Date   WBC 4.5 11/03/2017   HGB 11.1 (L) 11/03/2017   HCT 32.9 (L) 11/03/2017   MCV 90.1 11/03/2017   PLT 188 11/03/2017     Chemistry      Component Value Date/Time   NA 142 11/03/2017 1201   NA 146 (H) 06/11/2017 1614   NA 146 (H) 01/25/2017 1448   NA  142 07/31/2015 1203   K 3.8 11/03/2017 1201   K 4.0 01/25/2017 1448   K 5.1 07/31/2015 1203   CL 106 11/03/2017 1201   CL 108 01/25/2017 1448   CO2 25 11/03/2017 1201   CO2 29 01/25/2017 1448   CO2 23 07/31/2015 1203   BUN 8 11/03/2017 1201   BUN 13 06/11/2017 1614   BUN 18 01/25/2017 1448   BUN 23.3 07/31/2015 1203   CREATININE 0.87 11/03/2017 1201   CREATININE 1.2 01/25/2017 1448   CREATININE 1.1 07/31/2015 1203      Component Value Date/Time   CALCIUM 9.1 11/03/2017 1201   CALCIUM 9.3 01/25/2017 1448   CALCIUM 9.3 07/31/2015 1203   ALKPHOS 85 11/03/2017 1201   ALKPHOS 64 01/25/2017 1448   ALKPHOS 57 07/31/2015 1203   AST 25 11/03/2017 1201   AST 18 07/31/2015 1203   ALT 14 11/03/2017 1201   ALT 20 01/25/2017 1448   ALT 15 07/31/2015 1203   BILITOT 2.2 (H) 11/03/2017 1201   BILITOT 0.96 07/31/2015 1203         Impression and Plan: Ms. Park is a 84 year old white female.  She has metastatic breast cancer.  It seems as if her disease is still confined to the bones.  We will see how she does with the ribociclib.  I will go ahead and plan to get her back in another month.  Hopefully, her heart will be attended to.  She will not get a dose of Xgeva until July.   Volanda Napoleon,  MD 5/29/20195:51 PM

## 2017-11-04 ENCOUNTER — Telehealth: Payer: Self-pay | Admitting: Pharmacy Technician

## 2017-11-04 ENCOUNTER — Ambulatory Visit (INDEPENDENT_AMBULATORY_CARE_PROVIDER_SITE_OTHER): Payer: Medicare Other | Admitting: Pharmacist Clinician (PhC)/ Clinical Pharmacy Specialist

## 2017-11-04 ENCOUNTER — Ambulatory Visit (INDEPENDENT_AMBULATORY_CARE_PROVIDER_SITE_OTHER): Payer: Medicare Other | Admitting: Cardiovascular Disease

## 2017-11-04 ENCOUNTER — Encounter: Payer: Self-pay | Admitting: Cardiovascular Disease

## 2017-11-04 ENCOUNTER — Telehealth: Payer: Self-pay | Admitting: Pharmacist

## 2017-11-04 ENCOUNTER — Telehealth (HOSPITAL_COMMUNITY): Payer: Self-pay | Admitting: *Deleted

## 2017-11-04 VITALS — BP 132/74 | HR 113 | Ht 65.0 in | Wt 160.8 lb

## 2017-11-04 DIAGNOSIS — I495 Sick sinus syndrome: Secondary | ICD-10-CM | POA: Diagnosis not present

## 2017-11-04 DIAGNOSIS — I480 Paroxysmal atrial fibrillation: Secondary | ICD-10-CM | POA: Diagnosis not present

## 2017-11-04 DIAGNOSIS — C50919 Malignant neoplasm of unspecified site of unspecified female breast: Secondary | ICD-10-CM | POA: Diagnosis not present

## 2017-11-04 DIAGNOSIS — C7951 Secondary malignant neoplasm of bone: Secondary | ICD-10-CM | POA: Diagnosis not present

## 2017-11-04 DIAGNOSIS — Z950 Presence of cardiac pacemaker: Secondary | ICD-10-CM | POA: Diagnosis not present

## 2017-11-04 DIAGNOSIS — Z7901 Long term (current) use of anticoagulants: Secondary | ICD-10-CM

## 2017-11-04 DIAGNOSIS — I4819 Other persistent atrial fibrillation: Secondary | ICD-10-CM

## 2017-11-04 DIAGNOSIS — I481 Persistent atrial fibrillation: Secondary | ICD-10-CM | POA: Diagnosis not present

## 2017-11-04 LAB — POCT INR: INR: 1.7 — AB (ref 2.0–3.0)

## 2017-11-04 LAB — IRON AND TIBC
Iron: 51 ug/dL (ref 41–142)
Saturation Ratios: 24 % (ref 21–57)
TIBC: 215 ug/dL — ABNORMAL LOW (ref 236–444)
UIBC: 164 ug/dL

## 2017-11-04 LAB — CANCER ANTIGEN 27.29: CA 27.29: 60.7 U/mL — ABNORMAL HIGH (ref 0.0–38.6)

## 2017-11-04 LAB — FERRITIN: Ferritin: 347 ng/mL — ABNORMAL HIGH (ref 9–269)

## 2017-11-04 MED ORDER — BISOPROLOL FUMARATE 5 MG PO TABS: 5.0000 mg | ORAL_TABLET | Freq: Every day | ORAL | 3 refills | Status: DC

## 2017-11-04 NOTE — Patient Instructions (Signed)
Dr Sallyanne Kuster has recommended making the following medication changes: 1. START Bisoprolol 5 mg daily  Your physician recommends that you schedule a follow-up appointment in 3-4 with Roderic Palau, NP in the a-fib clinic.  Dr Sallyanne Kuster recommends that you schedule a follow-up appointment in 3 months.  If you need a refill on your cardiac medications before your next appointment, please call your pharmacy.

## 2017-11-04 NOTE — Telephone Encounter (Signed)
Oral Oncology Patient Advocate Encounter  Received notification from [facility] that prior authorization for Amy White is required.  PA submitted on CoverMyMeds Key [id number] Status is pending  Oral Oncology Clinic will continue to follow.  [name]. [name], [name] [phone] 11/04/2017 12:09 PM
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
## 2017-11-04 NOTE — Telephone Encounter (Signed)
Oral Oncology Pharmacist Encounter  Received new prescription for Kisqali (ribociclib) for the treatment of metastatic breast cancer in conjunction with exemestane, planned duration until disease progression or unacceptable drug toxicity.  CBC/CMP from 11/03/17 assessed, no relevant lab abnormalities. ECG from 10/06/17 showed QTc of 454. Patient will need a repeat QTc on day 14. Prescription dose and frequency assessed.   Current medication list in Epic reviewed, a few DDIs with Kisqali identified: - Kisqali can increase the drug concentrations of alprazolam, hydrocodone, and oxycodone. Patient should be monitored for increased effectiveness of the listed medication. No baseline dose adjustment is recommended.   Prescription has been e-scribed to the [facility] for benefits analysis and approval.  Oral Oncology Clinic will continue to follow for insurance authorization, copayment issues, initial counseling and start date.  Darl Pikes, PharmD, BCPS Hematology/Oncology Clinical Pharmacist [facility] [phone]  11/04/2017 10:37 AM

## 2017-11-04 NOTE — Telephone Encounter (Signed)
LMOM for pt to clbk to sched 3-4 week f/u with Butch Penny per Dr. Orene Desanctis.

## 2017-11-06 ENCOUNTER — Encounter: Payer: Self-pay | Admitting: Cardiovascular Disease

## 2017-11-08 DIAGNOSIS — S32020A Wedge compression fracture of second lumbar vertebra, initial encounter for closed fracture: Secondary | ICD-10-CM | POA: Diagnosis not present

## 2017-11-08 MED FILL — KISQALI 400 MG DAILY DOSE: 200 | 28 days supply | Qty: 42 | Fill #0

## 2017-11-08 NOTE — Telephone Encounter (Signed)
Oral Chemotherapy Pharmacist Encounter  Patient Education I spoke with patient for overview of new oral chemotherapy medication: Kisqali (ribociclib) for the treatment of metastatic breast cancer, planned duration until disease progression or unacceptable drug toxicity.   Pt is doing well. Counseled patient on administration, dosing, side effects, monitoring, drug-food interactions, safe handling, storage, and disposal. Patient will take 400 mg by mouth daily. Take 2 pills daily for 21 days on and 7 days off.  Side effects include but not limited to: Decreased wbc/hgb, diarrhea, N/V.    Reviewed with patient importance of keeping a medication schedule and plan for any missed doses.  Amy White voiced understanding and appreciation. All questions answered. Medication handout placed in the mail.   Provided patient with [facility] phone number. Patient knows to call the office with questions or concerns. Oral Chemotherapy Navigation Clinic will continue to follow.  Darl Pikes, PharmD, BCPS Hematology/Oncology Clinical Pharmacist [facility] [phone]  11/08/2017 1:08 PM

## 2017-11-08 NOTE — Telephone Encounter (Signed)
Oral Oncology Pharmacist Encounter   Prior Authorization for Thelma Comp has been approved.     Effective dates: 11/05/17 through 11/05/18   Oral Oncology Clinic will continue to follow.   Darl Pikes, PharmD, BCPS Hematology/Oncology Clinical Pharmacist [facility] [phone]  11/08/2017 11:24 AM

## 2017-11-10 ENCOUNTER — Ambulatory Visit (INDEPENDENT_AMBULATORY_CARE_PROVIDER_SITE_OTHER): Payer: Medicare Other | Admitting: Pharmacist Clinician (PhC)/ Clinical Pharmacy Specialist

## 2017-11-10 DIAGNOSIS — Z7901 Long term (current) use of anticoagulants: Secondary | ICD-10-CM | POA: Diagnosis not present

## 2017-11-10 DIAGNOSIS — I481 Persistent atrial fibrillation: Secondary | ICD-10-CM

## 2017-11-10 DIAGNOSIS — I4819 Other persistent atrial fibrillation: Secondary | ICD-10-CM

## 2017-11-10 LAB — POCT INR: INR: 2 (ref 2.0–3.0)

## 2017-11-11 ENCOUNTER — Telehealth (HOSPITAL_COMMUNITY): Payer: Self-pay | Admitting: *Deleted

## 2017-11-11 NOTE — Telephone Encounter (Signed)
LMOM to clbk to sched follow up per Dr. Orene Desanctis.  3-4 weeks with afib clinic

## 2017-11-16 ENCOUNTER — Other Ambulatory Visit: Payer: Self-pay

## 2017-11-17 ENCOUNTER — Ambulatory Visit (INDEPENDENT_AMBULATORY_CARE_PROVIDER_SITE_OTHER): Payer: Medicare Other | Admitting: Pharmacist

## 2017-11-17 DIAGNOSIS — Z7901 Long term (current) use of anticoagulants: Secondary | ICD-10-CM

## 2017-11-17 DIAGNOSIS — I4819 Other persistent atrial fibrillation: Secondary | ICD-10-CM

## 2017-11-17 LAB — POCT INR: INR: 2.5 (ref 2.0–3.0)

## 2017-11-18 ENCOUNTER — Telehealth: Payer: Self-pay | Admitting: *Deleted

## 2017-11-18 DIAGNOSIS — Z5181 Encounter for therapeutic drug level monitoring: Secondary | ICD-10-CM | POA: Diagnosis not present

## 2017-11-18 NOTE — Telephone Encounter (Signed)
Amy White, [facility] RN, is notifying the office that the patient has a small boil to her R buttocks near her vagina. Per the PCP they've been applying warm compresses to the site. The boil doesn't look any worse, and might be starting to improve. Amy White wants to make sure she can continue taking chemo, and to see if Dr Marin Olp would like to do anything else.  Reviewed with Dr Marin Olp. Patient is to continue chemo. There is no additional treatment that Dr Marin Olp would like to start.  Amy RN notified of Dr Antonieta Pert recommendations via message left on confidential voice mail.

## 2017-11-22 ENCOUNTER — Telehealth: Payer: Self-pay | Admitting: Cardiovascular Disease

## 2017-11-22 DIAGNOSIS — E039 Hypothyroidism, unspecified: Secondary | ICD-10-CM | POA: Diagnosis not present

## 2017-11-22 NOTE — Telephone Encounter (Signed)
Attempted to return call to Ria Comment - she is not available at this time

## 2017-11-22 NOTE — Telephone Encounter (Signed)
New Message    Ria Comment with [facility] is calling about orders for INR and CBC to be done with PT. Please call to discuss.

## 2017-11-23 NOTE — Telephone Encounter (Signed)
Left message for Amy White to call

## 2017-11-24 ENCOUNTER — Ambulatory Visit (INDEPENDENT_AMBULATORY_CARE_PROVIDER_SITE_OTHER): Payer: Medicare Other | Admitting: Pharmacist

## 2017-11-24 DIAGNOSIS — Z7901 Long term (current) use of anticoagulants: Secondary | ICD-10-CM | POA: Diagnosis not present

## 2017-11-24 DIAGNOSIS — I4819 Other persistent atrial fibrillation: Secondary | ICD-10-CM

## 2017-11-24 LAB — POCT INR: INR: 2.2 (ref 2.0–3.0)

## 2017-11-26 ENCOUNTER — Telehealth: Payer: Self-pay | Admitting: *Deleted

## 2017-11-26 NOTE — Telephone Encounter (Signed)
Received a call from [place], [place] for [facility]. He is concerned because patient states she feels like she did last time, when she got sick and needed to be admitted to the hospital. He states her VSS and there is no specific complaint.  Call the patient and spoke to her. She states she feels listless and worn down. She has no other specific complaints. She feels like she is able to wait until her appointment on Wednesday. Reviewed symptoms for her to call the on call or seek care at the ED including shortness of breath, dizziness, faintness, fever or chills. She understood. She will f/u with [place] next Wednesday during her appointment, or patient will call the office back beforehand if she thinks its needed.

## 2017-12-01 ENCOUNTER — Inpatient Hospital Stay: Payer: Medicare Other | Attending: Hematology & Oncology | Admitting: Hematology & Oncology

## 2017-12-01 ENCOUNTER — Encounter: Payer: Self-pay | Admitting: Hematology & Oncology

## 2017-12-01 ENCOUNTER — Other Ambulatory Visit: Payer: Self-pay

## 2017-12-01 ENCOUNTER — Inpatient Hospital Stay: Payer: Medicare Other

## 2017-12-01 VITALS — BP 123/93 | HR 78 | Temp 98.0°F | Resp 20 | Wt 162.4 lb

## 2017-12-01 DIAGNOSIS — C7951 Secondary malignant neoplasm of bone: Secondary | ICD-10-CM

## 2017-12-01 DIAGNOSIS — C50911 Malignant neoplasm of unspecified site of right female breast: Secondary | ICD-10-CM | POA: Diagnosis not present

## 2017-12-01 DIAGNOSIS — Z79899 Other long term (current) drug therapy: Secondary | ICD-10-CM

## 2017-12-01 DIAGNOSIS — I4891 Unspecified atrial fibrillation: Secondary | ICD-10-CM | POA: Diagnosis not present

## 2017-12-01 DIAGNOSIS — Z79811 Long term (current) use of aromatase inhibitors: Secondary | ICD-10-CM | POA: Diagnosis not present

## 2017-12-01 DIAGNOSIS — Z170 Estrogen receptor positive status [ER+]: Secondary | ICD-10-CM | POA: Diagnosis not present

## 2017-12-01 DIAGNOSIS — Z7901 Long term (current) use of anticoagulants: Secondary | ICD-10-CM | POA: Diagnosis not present

## 2017-12-01 DIAGNOSIS — C50919 Malignant neoplasm of unspecified site of unspecified female breast: Secondary | ICD-10-CM | POA: Insufficient documentation

## 2017-12-01 DIAGNOSIS — M1990 Unspecified osteoarthritis, unspecified site: Secondary | ICD-10-CM

## 2017-12-01 LAB — CMP (CANCER CENTER ONLY)
ALT: 23 U/L (ref 10–47)
AST: 27 U/L (ref 11–38)
Albumin: 3.2 g/dL — ABNORMAL LOW (ref 3.5–5.0)
Alkaline Phosphatase: 106 U/L — ABNORMAL HIGH (ref 26–84)
Anion gap: 9 (ref 5–15)
BUN: 15 mg/dL (ref 7–22)
CO2: 26 mmol/L (ref 18–33)
Calcium: 9 mg/dL (ref 8.0–10.3)
Chloride: 110 mmol/L — ABNORMAL HIGH (ref 98–108)
Creatinine: 1.2 mg/dL (ref 0.60–1.20)
Glucose, Bld: 122 mg/dL — ABNORMAL HIGH (ref 73–118)
Potassium: 4.3 mmol/L (ref 3.3–4.7)
Sodium: 145 mmol/L (ref 128–145)
Total Bilirubin: 2.6 mg/dL — ABNORMAL HIGH (ref 0.2–1.6)
Total Protein: 6.4 g/dL (ref 6.4–8.1)

## 2017-12-01 LAB — CBC WITH DIFFERENTIAL (CANCER CENTER ONLY)
Basophils Absolute: 0 10*3/uL (ref 0.0–0.1)
Basophils Relative: 1 %
Eosinophils Absolute: 0.1 10*3/uL (ref 0.0–0.5)
Eosinophils Relative: 3 %
HCT: 33.4 % — ABNORMAL LOW (ref 34.8–46.6)
Hemoglobin: 10.7 g/dL — ABNORMAL LOW (ref 11.6–15.9)
Lymphocytes Relative: 28 %
Lymphs Abs: 0.7 10*3/uL — ABNORMAL LOW (ref 0.9–3.3)
MCH: 32.5 pg (ref 26.0–34.0)
MCHC: 32 g/dL (ref 32.0–36.0)
MCV: 101.5 fL — ABNORMAL HIGH (ref 81.0–101.0)
Monocytes Absolute: 0.2 10*3/uL (ref 0.1–0.9)
Monocytes Relative: 7 %
Neutro Abs: 1.6 10*3/uL (ref 1.5–6.5)
Neutrophils Relative %: 61 %
Platelet Count: 123 10*3/uL — ABNORMAL LOW (ref 145–400)
RBC: 3.29 MIL/uL — ABNORMAL LOW (ref 3.70–5.32)
RDW: 19.9 % — ABNORMAL HIGH (ref 11.1–15.7)
WBC Count: 2.6 10*3/uL — ABNORMAL LOW (ref 3.9–10.0)

## 2017-12-01 NOTE — Progress Notes (Signed)
Hematology and Oncology Follow Up Visit  Amy White [id number] 1925/11/28 84 y.o. 12/01/2017   Principle Diagnosis:   Metastatic breast cancer-bone only metastasis  Current Therapy:    Aromasin 25 mg p.o. Daily  Ribociclib 400 mg po q day (21/7)  Xgeva 120 mg sq q 3 months - next dose 12/2017      Interim History:  Amy White is back for follow-up.  She is doing much better.  She feels better.  The ribociclib is definitely a better choice for her.  She still is dealing with the atrial fibrillation.  She is on Coumadin.  She sees her cardiologist for this.  She is had no problems with the Aromasin.  She is had no cough or shortness of breath.  She is had no diarrhea.  Is been no dysuria.  She has had no rashes.  She has horrible arthritis.  This, thankfully is not any worse.  She says that she does have a lot of arthritic pain whenever it rains.  Overall, her performance status is ECOG 2.   Medications:  Current Outpatient Medications:  .  acetaminophen (TYLENOL) 500 MG tablet, Take 500 mg by mouth every 6 (six) hours as needed for fever., Disp: , Rfl:  .  albuterol (PROVENTIL HFA;VENTOLIN HFA) 108 (90 BASE) MCG/ACT inhaler, Inhale 2 puffs into the lungs every 6 (six) hours as needed for wheezing or shortness of breath., Disp: , Rfl:  .  ALPRAZolam (XANAX) 0.5 MG tablet, Take 0.25 mg by mouth 2 (two) times daily as needed for anxiety. , Disp: , Rfl: 0 .  bisoprolol (ZEBETA) 5 MG tablet, Take 1 tablet (5 mg total) by mouth daily., Disp: 90 tablet, Rfl: 3 .  diphenoxylate-atropine (LOMOTIL) 2.5-0.025 MG tablet, Take one tablet after each loose stool. Maximum of 8 tablets per day. (Patient taking differently: Take 1 tablet by mouth 4 (four) times daily as needed for diarrhea or loose stools. Maximum of 8 tablets per day.), Disp: 30 tablet, Rfl: 1 .  exemestane (AROMASIN) 25 MG tablet, Take 1 tablet (25 mg total) by mouth daily after breakfast., Disp: 30 tablet, Rfl: 12 .   feeding supplement, ENSURE ENLIVE, (ENSURE ENLIVE) LIQD, Take 237 mLs by mouth 2 (two) times daily between meals., Disp: 237 mL, Rfl: 12 .  HYDROcodone-acetaminophen (NORCO/VICODIN) 5-325 MG tablet, Take 1 tablet by mouth every 6 (six) hours as needed (For pain.). , Disp: , Rfl: 0 .  levothyroxine (SYNTHROID, LEVOTHROID) 100 MCG tablet, Take 100 mcg by mouth daily before breakfast. , Disp: , Rfl: 3 .  oxyCODONE-acetaminophen (PERCOCET/ROXICET) 5-325 MG tablet, Take 1 tablet by mouth every 4 (four) hours as needed for severe pain (For pain.). , Disp: , Rfl:  .  pantoprazole (PROTONIX) 40 MG tablet, Take 1 tablet (40 mg total) by mouth daily. (Patient taking differently: Take 40 mg by mouth daily as needed (For heartburn or acid reflux.). ), Disp: 30 tablet, Rfl: 4 .  polyethylene glycol (MIRALAX / GLYCOLAX) packet, Take 17 g by mouth daily as needed for mild constipation., Disp: 14 each, Rfl: 0 .  Ribociclib Succinate 400 Dose 200 MG TBPK, Take by mouth., Disp: , Rfl:  .  tetrahydrozoline-zinc (VISINE-AC) 0.05-0.25 % ophthalmic solution, Place 2 drops into both eyes 3 (three) times daily as needed (red eyes)., Disp: , Rfl:  .  Vitamin D, Ergocalciferol, (DRISDOL) 50000 units CAPS capsule, TAKE 1 CAPSULE BY MOUTH 1 TIME A WEEK, Disp: 12 capsule, Rfl: 3 .  warfarin (COUMADIN) 5  MG tablet, Take 2.5-5 mg by mouth every evening. Take one tablet on Tuesday and Thursday and half a tablet the rest of the week., Disp: , Rfl:   Allergies:  Allergies  Allergen Reactions  . Morphine And Related Rash  . Crestor [Rosuvastatin] Other (See Comments)    Reaction unknown  . Sulfa Antibiotics Other (See Comments)    Mouth breaks out in sores  . Ciprofloxacin Rash and Other (See Comments)    Rash in the mouth  . Penicillins Hives, Rash and Other (See Comments)    Has patient had a PCN reaction causing immediate rash, facial/tongue/throat swelling, SOB or lightheadedness with hypotension: yes Has patient had a PCN  reaction causing severe rash involving mucus membranes or skin necrosis: no Has patient had a PCN reaction that required hospitalization: no Has patient had a PCN reaction occurring within the last 10 years: yes If all of the above answers are "NO", then may proceed with Cephalosporin use.   . Vancomycin Rash and Other (See Comments)    "Red-man"    Past Medical History, Surgical history, Social history, and Family History were reviewed and updated.  Review of Systems: Review of Systems  Constitutional: Negative.   HENT:  Negative.   Eyes: Negative.   Respiratory: Negative.   Cardiovascular: Negative.   Gastrointestinal: Negative.   Endocrine: Negative.   Genitourinary: Negative.    Musculoskeletal: Positive for back pain and myalgias.  Skin: Negative.   Neurological: Negative.   Hematological: Negative.   Psychiatric/Behavioral: Negative.     Physical Exam:  weight is 162 lb 6.4 oz (73.7 kg). Her oral temperature is 98 F (36.7 C). Her blood pressure is 123/93 (abnormal) and her pulse is 78. Her respiration is 20 and oxygen saturation is 99%.   Wt Readings from Last 3 Encounters:  12/01/17 162 lb 6.4 oz (73.7 kg)  11/04/17 160 lb 12.8 oz (72.9 kg)  11/03/17 160 lb 12 oz (72.9 kg)    Physical Exam  Constitutional: She is oriented to person, place, and time.  HENT:  Head: Normocephalic and atraumatic.  Mouth/Throat: Oropharynx is clear and moist.  Eyes: Pupils are equal, round, and reactive to light. EOM are normal.  Neck: Normal range of motion.  Cardiovascular: Normal rate, regular rhythm and normal heart sounds.  Pulmonary/Chest: Effort normal and breath sounds normal.  Abdominal: Soft. Bowel sounds are normal.  Musculoskeletal: Normal range of motion. She exhibits no edema, tenderness or deformity.  Lymphadenopathy:    She has no cervical adenopathy.  Neurological: She is alert and oriented to person, place, and time.  Skin: Skin is warm and dry. No rash noted.  No erythema.  Psychiatric: She has a normal mood and affect. Her behavior is normal. Judgment and thought content normal.  Vitals reviewed.    Lab Results  Component Value Date   WBC 2.6 (L) 12/01/2017   HGB 10.7 (L) 12/01/2017   HCT 33.4 (L) 12/01/2017   MCV 101.5 (H) 12/01/2017   PLT 123 (L) 12/01/2017     Chemistry      Component Value Date/Time   NA 145 12/01/2017 1054   NA 146 (H) 06/11/2017 1614   NA 146 (H) 01/25/2017 1448   NA 142 07/31/2015 1203   K 4.3 12/01/2017 1054   K 4.0 01/25/2017 1448   K 5.1 07/31/2015 1203   CL 110 (H) 12/01/2017 1054   CL 108 01/25/2017 1448   CO2 26 12/01/2017 1054   CO2 29 01/25/2017 1448  CO2 23 07/31/2015 1203   BUN 15 12/01/2017 1054   BUN 13 06/11/2017 1614   BUN 18 01/25/2017 1448   BUN 23.3 07/31/2015 1203   CREATININE 1.20 12/01/2017 1054   CREATININE 1.2 01/25/2017 1448   CREATININE 1.1 07/31/2015 1203      Component Value Date/Time   CALCIUM 9.0 12/01/2017 1054   CALCIUM 9.3 01/25/2017 1448   CALCIUM 9.3 07/31/2015 1203   ALKPHOS 106 (H) 12/01/2017 1054   ALKPHOS 64 01/25/2017 1448   ALKPHOS 57 07/31/2015 1203   AST 27 12/01/2017 1054   AST 18 07/31/2015 1203   ALT 23 12/01/2017 1054   ALT 20 01/25/2017 1448   ALT 15 07/31/2015 1203   BILITOT 2.6 (H) 12/01/2017 1054   BILITOT 0.96 07/31/2015 1203         Impression and Plan: Amy White is a 84 year old white female.  She has metastatic breast cancer.  It seems as if her disease is still confined to the bones.  So far, she is doing well with the ribociclib/Aromasin combination.  We will see what her CA 27.29 is.  I will plan to get her back in 4 weeks.  We will give her the [place] See will only see her back.Marland Kitchen   Volanda Napoleon, MD 6/26/201912:23 PM

## 2017-12-02 LAB — CANCER ANTIGEN 27.29: CA 27.29: 54.9 U/mL — ABNORMAL HIGH (ref 0.0–38.6)

## 2017-12-03 ENCOUNTER — Ambulatory Visit (INDEPENDENT_AMBULATORY_CARE_PROVIDER_SITE_OTHER): Payer: Medicare Other | Admitting: Pharmacist Clinician (PhC)/ Clinical Pharmacy Specialist

## 2017-12-03 DIAGNOSIS — I481 Persistent atrial fibrillation: Secondary | ICD-10-CM | POA: Diagnosis not present

## 2017-12-03 DIAGNOSIS — Z7901 Long term (current) use of anticoagulants: Secondary | ICD-10-CM

## 2017-12-03 DIAGNOSIS — I4819 Other persistent atrial fibrillation: Secondary | ICD-10-CM

## 2017-12-03 LAB — POCT INR: INR: 3.4 — AB (ref 2.0–3.0)

## 2017-12-06 ENCOUNTER — Ambulatory Visit (HOSPITAL_COMMUNITY)
Admission: RE | Admit: 2017-12-06 | Discharge: 2017-12-06 | Disposition: A | Discharge status: Home / Self Care | Payer: Medicare Other | Source: Ambulatory Visit | Attending: Nurse Practitioner | Admitting: Nurse Practitioner

## 2017-12-06 ENCOUNTER — Encounter (HOSPITAL_COMMUNITY): Payer: Self-pay | Admitting: Nurse Practitioner

## 2017-12-06 VITALS — BP 132/66 | HR 77 | Ht 65.0 in | Wt 162.0 lb

## 2017-12-06 DIAGNOSIS — I481 Persistent atrial fibrillation: Secondary | ICD-10-CM

## 2017-12-06 DIAGNOSIS — Z809 Family history of malignant neoplasm, unspecified: Secondary | ICD-10-CM | POA: Diagnosis not present

## 2017-12-06 DIAGNOSIS — E0590 Thyrotoxicosis, unspecified without thyrotoxic crisis or storm: Secondary | ICD-10-CM | POA: Diagnosis not present

## 2017-12-06 DIAGNOSIS — Z79899 Other long term (current) drug therapy: Secondary | ICD-10-CM | POA: Diagnosis not present

## 2017-12-06 DIAGNOSIS — Z950 Presence of cardiac pacemaker: Secondary | ICD-10-CM | POA: Insufficient documentation

## 2017-12-06 DIAGNOSIS — Z880 Allergy status to penicillin: Secondary | ICD-10-CM | POA: Insufficient documentation

## 2017-12-06 DIAGNOSIS — Z885 Allergy status to narcotic agent status: Secondary | ICD-10-CM | POA: Diagnosis not present

## 2017-12-06 DIAGNOSIS — I10 Essential (primary) hypertension: Secondary | ICD-10-CM | POA: Diagnosis not present

## 2017-12-06 DIAGNOSIS — Z882 Allergy status to sulfonamides status: Secondary | ICD-10-CM | POA: Diagnosis not present

## 2017-12-06 DIAGNOSIS — Z79891 Long term (current) use of opiate analgesic: Secondary | ICD-10-CM | POA: Insufficient documentation

## 2017-12-06 DIAGNOSIS — Z853 Personal history of malignant neoplasm of breast: Secondary | ICD-10-CM | POA: Diagnosis not present

## 2017-12-06 DIAGNOSIS — J449 Chronic obstructive pulmonary disease, unspecified: Secondary | ICD-10-CM | POA: Insufficient documentation

## 2017-12-06 DIAGNOSIS — Z85830 Personal history of malignant neoplasm of bone: Secondary | ICD-10-CM | POA: Diagnosis not present

## 2017-12-06 DIAGNOSIS — Z8249 Family history of ischemic heart disease and other diseases of the circulatory system: Secondary | ICD-10-CM | POA: Insufficient documentation

## 2017-12-06 DIAGNOSIS — F419 Anxiety disorder, unspecified: Secondary | ICD-10-CM | POA: Insufficient documentation

## 2017-12-06 DIAGNOSIS — Z881 Allergy status to other antibiotic agents status: Secondary | ICD-10-CM | POA: Diagnosis not present

## 2017-12-06 DIAGNOSIS — Z9889 Other specified postprocedural states: Secondary | ICD-10-CM | POA: Diagnosis not present

## 2017-12-06 DIAGNOSIS — I4819 Other persistent atrial fibrillation: Secondary | ICD-10-CM

## 2017-12-06 DIAGNOSIS — K219 Gastro-esophageal reflux disease without esophagitis: Secondary | ICD-10-CM | POA: Diagnosis not present

## 2017-12-06 DIAGNOSIS — Z9012 Acquired absence of left breast and nipple: Secondary | ICD-10-CM | POA: Diagnosis not present

## 2017-12-06 DIAGNOSIS — Z7901 Long term (current) use of anticoagulants: Secondary | ICD-10-CM | POA: Insufficient documentation

## 2017-12-06 MED FILL — KISQALI 400 MG DAILY DOSE: 200 | 28 days supply | Qty: 42 | Fill #1
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
## 2017-12-08 NOTE — Progress Notes (Signed)
Thank you, Butch Penny! MCr

## 2017-12-10 LAB — CUP PACEART INCLINIC DEVICE CHECK
Date Time Interrogation Session: 20190705154350
Implantable Lead Implant Date: 20160223
Implantable Lead Implant Date: 20160223
Implantable Lead Location: 753859
Implantable Lead Location: 753860
Implantable Lead Model: 5076
Implantable Lead Model: 5076
Implantable Pulse Generator Implant Date: 20160223
Lead Channel Setting Pacing Amplitude: 1.5 V
Lead Channel Setting Pacing Amplitude: 2.25 V
Lead Channel Setting Pacing Pulse Width: 0.4 ms
Lead Channel Setting Sensing Sensitivity: 0.9 mV

## 2017-12-14 ENCOUNTER — Ambulatory Visit (INDEPENDENT_AMBULATORY_CARE_PROVIDER_SITE_OTHER): Payer: Medicare Other | Admitting: Pharmacist Clinician (PhC)/ Clinical Pharmacy Specialist

## 2017-12-14 DIAGNOSIS — Z7901 Long term (current) use of anticoagulants: Secondary | ICD-10-CM | POA: Diagnosis not present

## 2017-12-14 DIAGNOSIS — I481 Persistent atrial fibrillation: Secondary | ICD-10-CM

## 2017-12-14 DIAGNOSIS — I4819 Other persistent atrial fibrillation: Secondary | ICD-10-CM

## 2017-12-14 LAB — POCT INR: INR: 1.8 — AB (ref 2.0–3.0)

## 2017-12-16 ENCOUNTER — Inpatient Hospital Stay: Payer: Medicare Other | Attending: Hematology & Oncology

## 2017-12-16 VITALS — BP 124/67 | HR 96 | Temp 97.9°F | Resp 20

## 2017-12-16 DIAGNOSIS — C50911 Malignant neoplasm of unspecified site of right female breast: Secondary | ICD-10-CM | POA: Diagnosis present

## 2017-12-16 DIAGNOSIS — Z170 Estrogen receptor positive status [ER+]: Secondary | ICD-10-CM | POA: Diagnosis not present

## 2017-12-16 DIAGNOSIS — C7951 Secondary malignant neoplasm of bone: Secondary | ICD-10-CM | POA: Insufficient documentation

## 2017-12-16 DIAGNOSIS — C50912 Malignant neoplasm of unspecified site of left female breast: Secondary | ICD-10-CM

## 2017-12-16 DIAGNOSIS — Z79811 Long term (current) use of aromatase inhibitors: Secondary | ICD-10-CM | POA: Diagnosis not present

## 2017-12-16 MED ORDER — DENOSUMAB 120 MG/1.7ML ~~LOC~~ SOLN
SUBCUTANEOUS | Status: AC
  Filled 2017-12-16: qty 1.7

## 2017-12-16 MED ORDER — DENOSUMAB 120 MG/1.7ML ~~LOC~~ SOLN
120.0000 mg | Freq: Once | SUBCUTANEOUS | Status: AC
  Administered 2017-12-16: 120 mg via SUBCUTANEOUS

## 2017-12-16 NOTE — Patient Instructions (Signed)

## 2017-12-21 ENCOUNTER — Encounter: Payer: Self-pay | Admitting: Internal Medicine

## 2017-12-21 ENCOUNTER — Other Ambulatory Visit: Payer: Self-pay | Admitting: Internal Medicine

## 2017-12-21 DIAGNOSIS — Z515 Encounter for palliative care: Secondary | ICD-10-CM

## 2017-12-21 DIAGNOSIS — R52 Pain, unspecified: Secondary | ICD-10-CM

## 2017-12-21 NOTE — Progress Notes (Signed)
PALLIATIVE CARE CONSULT VISIT   PATIENT NAME: Amy White DOB: 1926/05/04 MRN: [id number]  PRIMARY CARE PROVIDER:   Mayra Neer, MD  REFERRING PROVIDER:  Mayra Neer, MD [address] [place], [place] [address]  RESPONSIBLE PARTY:   Arrie Aran      IMPRESSION/RECOMMENDATIONS:  1. Pain; setting of known metastatic (to bone) breast cancer.  -States only current pain is left anterior chest wall along incision line from prior mastectomy. Taking occasional prn oxycodone/acetaminophen total of a few / month and prn Tylenol (occasional weekly dose). Avoids pain med use as makes her feel "loopy" and associated LE weakness ("I don't like taking pills).   2. Anterior chest palpitations r/t atrial fibrillation: Can feel when HR elevated ("butterflies having sex in my chest!") which subsequently makes her feel anxious. Prn Xanax use with good relief. On Zebeta for rate control and HR ranging 80-110 or so. Dr. Sallyanne Kuster had discussed possible cardioversion, but patient's remembers her husband had atrial fibrillation/CV without long term efficacy so she doesn't wish to try. Patient mentions that Dr. Sallyanne Kuster is no longer pushing this option, given her current struggles with cancer therapy.  3. Small (0.5 cm) raised bumpy lesion left of left eye: I expressed a small amount of pus; advised patient to apply warm compresses/neosporin ointment.    4. Coping setting of serious illness: Draws on spiritual strength / strong family bonds as source of strength. Feels that she has led a rich and full life (rode a motorcycle for 20 years!) despite the tragedies (cancer/loss of her son) in her life.We discussed some community resources available to her ([facility]; service of [place] to provide rides to clinic appointments). Discussed resourceing the  LCSW through the [facility] to identify other potential resources available. Granddaughter  Biomedical engineer plans to call Dr. Hansel Starling clinic to ask for referral. Patient is on waiting list for Meals on Wheels, but is disappointed regarding the long waiting list. She is distraught regarding current SE of chemo, including some hair loss and breaking off of many of her lower jaw teeth (at least 7). This makes her very self conscious.   5. Advanced Care Planning: Reviewed DNR/MOST forms in some detail with patient and granddaughter. Examples of the forms and written information reviewed and left in the home. They wish to think about these options. We discussed Hospice eligibility criteria; she is a candidate for services should she decide, in the future, to forgo aggressive treatment measures for her metastatic breast cancer. Patient feels she is doing fairly well on current regimen and wishes to continue. We discussed that future decisions regarding benefits vs burdens of ongoing therapy could best be clarified with Dr. Marin Olp.  6. F/U NP palliative care visit scheduled for Mon Aug 5th; 1pm (Stephanie Martinique). Anticipate further discussion DNR/MOST forms; ongoing symptom management.  I spent 105 minutes providing this consultation,  from 1:30pm to 2:45pm. More than 50% of the time in this consultation was spent coordinating communication.   HISTORY OF PRESENT ILLNESS:  Amy White is a 84 y.o. year old female with multiple medical problems including metastatic (bone) breast cancer (current chemo regimen: ribociclib 21d on/7d off; Aromasin),  atrial fibrillation (anticoaglation, Zebeta), hypothyroidism, asthma, and SSS ([employer]tr[employer] PM). She is s/p hospitalization 05/17-05/22/2019 for treatment of fever (neutropenia; no clear infection source; empiric antibiotics), anemia (GI vs chemo induced; elevated INR. Transfused  1 unit PRBC) and diarrhea (chemo induced).  Palliative Care was  asked to help with symptom management, discussion of community resources, and address goals of care.  SH/H: Widowed, only son  deceased age 65 (sudden death; cause unknown). Very close to her granddaughter Thurnell Garbe and 3 grandchildren.  CODE STATUS: Full  PPS: 60%. Independent in ADLs,  Difficulty house hold chores HOSPICE ELIGIBILITY/DIAGNOSIS: TBD  PAST MEDICAL HISTORY:  Past Medical History:  Diagnosis Date  . Anxiety   . Arthritis    "all over"  . Asthma   . Atrial fibrillation (HCC)    a. paroxysmal, on Coumadin for anticoagulation  . Breast cancer ([place]) 1997   "left"  . Breast cancer metastasized to bone ([place]) 05/13/2011  . COPD (chronic obstructive pulmonary disease) ([place])   . Dysrhythmia   . GERD (gastroesophageal reflux disease)   . H/O hiatal hernia   . Hypertension   . Hyperthyroidism   . Presence of permanent cardiac pacemaker   . Slow urinary stream   . SSS (sick sinus syndrome) ([place])    a. s/p PPM placement in 2016.    SOCIAL HX:  Social History   Tobacco Use  . Smoking status: Never Smoker  . Smokeless tobacco: Never Used  . Tobacco comment: NEVER USED TOBACCO  Substance Use Topics  . Alcohol use: Yes    Alcohol/week: 0.0 oz    Comment: 03/30/2014 "might have a drink once/month in the summertime"    ALLERGIES:  Allergies  Allergen Reactions  . Morphine And Related Rash  . Crestor [Rosuvastatin] Other (See Comments)    Reaction unknown  . Sulfa Antibiotics Other (See Comments)    Mouth breaks out in sores  . Ciprofloxacin Rash and Other (See Comments)    Rash in the mouth  . Penicillins Hives, Rash and Other (See Comments)    Has patient had a PCN reaction causing immediate rash, facial/tongue/throat swelling, SOB or lightheadedness with hypotension: yes Has patient had a PCN reaction causing severe rash involving mucus membranes or skin necrosis: no Has patient had a PCN reaction that required hospitalization: no Has patient had a PCN reaction occurring within the last 10 years: yes If all of the above answers are "NO", then may proceed with Cephalosporin use.   .  Vancomycin Rash and Other (See Comments)    "Red-man"     PERTINENT MEDICATIONS:  Outpatient Encounter Medications as of 12/21/2017  Medication Sig  . acetaminophen (TYLENOL) 500 MG tablet Take 500 mg by mouth every 6 (six) hours as needed for fever.  Marland Kitchen albuterol (PROVENTIL HFA;VENTOLIN HFA) 108 (90 BASE) MCG/ACT inhaler Inhale 2 puffs into the lungs every 6 (six) hours as needed for wheezing or shortness of breath.  . ALPRAZolam (XANAX) 0.5 MG tablet Take 0.25 mg by mouth 2 (two) times daily as needed for anxiety.   . bisoprolol (ZEBETA) 5 MG tablet Take 1 tablet (5 mg total) by mouth daily.  . diphenoxylate-atropine (LOMOTIL) 2.5-0.025 MG tablet Take one tablet after each loose stool. Maximum of 8 tablets per day. (Patient taking differently: Take 1 tablet by mouth 4 (four) times daily as needed for diarrhea or loose stools. Maximum of 8 tablets per day.)  . exemestane (AROMASIN) 25 MG tablet Take 1 tablet (25 mg total) by mouth daily after breakfast.  . feeding supplement, ENSURE ENLIVE, (ENSURE ENLIVE) LIQD Take 237 mLs by mouth 2 (two) times daily between meals.  Marland Kitchen HYDROcodone-acetaminophen (NORCO/VICODIN) 5-325 MG tablet Take 1 tablet by mouth every 6 (six) hours as needed (For pain.).   Marland Kitchen levothyroxine (SYNTHROID,  LEVOTHROID) 100 MCG tablet Take 100 mcg by mouth daily before breakfast.   . oxyCODONE-acetaminophen (PERCOCET/ROXICET) 5-325 MG tablet Take 1 tablet by mouth every 4 (four) hours as needed for severe pain (For pain.).   Marland Kitchen Ribociclib Succinate 400 Dose 200 MG TBPK Take by mouth.  . tetrahydrozoline-zinc (VISINE-AC) 0.05-0.25 % ophthalmic solution Place 2 drops into both eyes 3 (three) times daily as needed (red eyes).  . Vitamin D, Ergocalciferol, (DRISDOL) 50000 units CAPS capsule TAKE 1 CAPSULE BY MOUTH 1 TIME A WEEK  . warfarin (COUMADIN) 5 MG tablet Take 2.5-5 mg by mouth every evening. Take one tablet on Tuesday and Thursday and half a tablet the rest of the week.  .  pantoprazole (PROTONIX) 40 MG tablet Take 1 tablet (40 mg total) by mouth daily. (Patient not taking: Reported on 12/21/2017)  . polyethylene glycol (MIRALAX / GLYCOLAX) packet Take 17 g by mouth daily as needed for mild constipation. (Patient not taking: Reported on 12/21/2017)   No facility-administered encounter medications on file as of 12/21/2017.     PHYSICAL EXAM:  VS: HR 80's to 110 irregular.  Slender, Pleasantly conversant, NAD. Granddaughter Thurnell Garbe present; 2 of her grandchildren wander in/out Cardiovascular: irregular rate and rhythm; no rub/gallop.  Pulmonary: LSCTA Abdomen: soft, nontender, a little protruding, NABS Extremities: bilateral LE edema mildly pitting, Last digit right finger contraction Skin: warm, dry, intact Neurological: A & O x 3  Julianne Handler, NP

## 2017-12-29 ENCOUNTER — Ambulatory Visit (INDEPENDENT_AMBULATORY_CARE_PROVIDER_SITE_OTHER): Payer: Medicare Other | Admitting: Pharmacist

## 2017-12-29 DIAGNOSIS — Z7901 Long term (current) use of anticoagulants: Secondary | ICD-10-CM

## 2017-12-29 DIAGNOSIS — I4891 Unspecified atrial fibrillation: Secondary | ICD-10-CM

## 2017-12-29 LAB — POCT INR: INR: 3.3 — AB (ref 2.0–3.0)

## 2018-01-05 MED FILL — KISQALI 400 MG DAILY DOSE: 200 | 28 days supply | Qty: 42 | Fill #2

## 2018-01-06 ENCOUNTER — Encounter: Payer: Self-pay | Admitting: Hematology & Oncology

## 2018-01-06 ENCOUNTER — Inpatient Hospital Stay (HOSPITAL_BASED_OUTPATIENT_CLINIC_OR_DEPARTMENT_OTHER): Payer: Medicare Other | Admitting: Hematology & Oncology

## 2018-01-06 ENCOUNTER — Other Ambulatory Visit: Payer: Self-pay

## 2018-01-06 ENCOUNTER — Inpatient Hospital Stay: Payer: Medicare Other | Attending: Hematology & Oncology

## 2018-01-06 VITALS — BP 130/68 | HR 89 | Temp 98.2°F | Resp 18 | Wt 162.0 lb

## 2018-01-06 DIAGNOSIS — C7951 Secondary malignant neoplasm of bone: Secondary | ICD-10-CM

## 2018-01-06 DIAGNOSIS — M549 Dorsalgia, unspecified: Secondary | ICD-10-CM | POA: Diagnosis not present

## 2018-01-06 DIAGNOSIS — Z7901 Long term (current) use of anticoagulants: Secondary | ICD-10-CM | POA: Diagnosis not present

## 2018-01-06 DIAGNOSIS — Z170 Estrogen receptor positive status [ER+]: Secondary | ICD-10-CM | POA: Insufficient documentation

## 2018-01-06 DIAGNOSIS — Z79899 Other long term (current) drug therapy: Secondary | ICD-10-CM

## 2018-01-06 DIAGNOSIS — Z79811 Long term (current) use of aromatase inhibitors: Secondary | ICD-10-CM | POA: Diagnosis not present

## 2018-01-06 DIAGNOSIS — C50911 Malignant neoplasm of unspecified site of right female breast: Secondary | ICD-10-CM | POA: Diagnosis not present

## 2018-01-06 LAB — CMP (CANCER CENTER ONLY)
ALT: 22 U/L (ref 10–47)
AST: 28 U/L (ref 11–38)
Albumin: 3.4 g/dL — ABNORMAL LOW (ref 3.5–5.0)
Alkaline Phosphatase: 146 U/L — ABNORMAL HIGH (ref 26–84)
Anion gap: 5 (ref 5–15)
BUN: 9 mg/dL (ref 7–22)
CO2: 28 mmol/L (ref 18–33)
Calcium: 9.5 mg/dL (ref 8.0–10.3)
Chloride: 108 mmol/L (ref 98–108)
Creatinine: 1.1 mg/dL (ref 0.60–1.20)
Glucose, Bld: 125 mg/dL — ABNORMAL HIGH (ref 73–118)
Potassium: 4.2 mmol/L (ref 3.3–4.7)
Sodium: 141 mmol/L (ref 128–145)
Total Bilirubin: 2 mg/dL — ABNORMAL HIGH (ref 0.2–1.6)
Total Protein: 6.7 g/dL (ref 6.4–8.1)

## 2018-01-06 LAB — CBC WITH DIFFERENTIAL (CANCER CENTER ONLY)
Basophils Absolute: 0.1 10*3/uL (ref 0.0–0.1)
Basophils Relative: 4 %
Eosinophils Absolute: 0 10*3/uL (ref 0.0–0.5)
Eosinophils Relative: 1 %
HCT: 33.5 % — ABNORMAL LOW (ref 34.8–46.6)
Hemoglobin: 11 g/dL — ABNORMAL LOW (ref 11.6–15.9)
Lymphocytes Relative: 37 %
Lymphs Abs: 0.5 10*3/uL — ABNORMAL LOW (ref 0.9–3.3)
MCH: 34.8 pg — ABNORMAL HIGH (ref 26.0–34.0)
MCHC: 32.8 g/dL (ref 32.0–36.0)
MCV: 106 fL — ABNORMAL HIGH (ref 81.0–101.0)
Monocytes Absolute: 0.3 10*3/uL (ref 0.1–0.9)
Monocytes Relative: 18 %
Neutro Abs: 0.6 10*3/uL — ABNORMAL LOW (ref 1.5–6.5)
Neutrophils Relative %: 40 %
Platelet Count: 114 10*3/uL — ABNORMAL LOW (ref 145–400)
RBC: 3.16 MIL/uL — ABNORMAL LOW (ref 3.70–5.32)
RDW: 16.2 % — ABNORMAL HIGH (ref 11.1–15.7)
WBC Count: 1.5 10*3/uL — ABNORMAL LOW (ref 3.9–10.0)

## 2018-01-06 NOTE — Progress Notes (Signed)
Hematology and Oncology Follow Up Visit  Amy White [id number] [birth year] 84 y.o. 01/06/2018   Principle Diagnosis:   Metastatic breast cancer-bone only metastasis  Current Therapy:    Aromasin 25 mg p.o. Daily  Ribociclib 400 mg po q day (21/7)  Xgeva 120 mg sq q 3 months - next dose 03/2018      Interim History:  Amy White is back for follow-up.  She is doing okay.  She really does not have any specific complaints.  She does state that her mid back hurts a little bit.  She is completing her week off of ribociclib.  Her white cell count is 1.5.  I think we probably need to give her another week off so that we can ensure that her white cell count comes back a little bit more vigorously.  Her last CA 27.29 was slightly better at 55.  She has not had any nausea or vomiting.  She is had no change in bowel or bladder habits.  She is had no leg swelling.  Patient does have cardiac issues.  Thankfully, there is been no exacerbations of her palpitations.  She is had no bleeding.  She does have fairly bad arthritis.  The hot humid weather has made this a little bit more challenging for her.  Overall, her performance status is ECOG 2.    Medications:  Current Outpatient Medications:  .  acetaminophen (TYLENOL) 500 MG tablet, Take 500 mg by mouth every 6 (six) hours as needed for fever., Disp: , Rfl:  .  albuterol (PROVENTIL HFA;VENTOLIN HFA) 108 (90 BASE) MCG/ACT inhaler, Inhale 2 puffs into the lungs every 6 (six) hours as needed for wheezing or shortness of breath., Disp: , Rfl:  .  ALPRAZolam (XANAX) 0.5 MG tablet, Take 0.25 mg by mouth 2 (two) times daily as needed for anxiety. , Disp: , Rfl: 0 .  bisoprolol (ZEBETA) 5 MG tablet, Take 1 tablet (5 mg total) by mouth daily., Disp: 90 tablet, Rfl: 3 .  diphenoxylate-atropine (LOMOTIL) 2.5-0.025 MG tablet, Take one tablet after each loose stool. Maximum of 8 tablets per day. (Patient taking differently: Take 1 tablet by mouth 4  (four) times daily as needed for diarrhea or loose stools. Maximum of 8 tablets per day.), Disp: 30 tablet, Rfl: 1 .  exemestane (AROMASIN) 25 MG tablet, Take 1 tablet (25 mg total) by mouth daily after breakfast., Disp: 30 tablet, Rfl: 12 .  feeding supplement, ENSURE ENLIVE, (ENSURE ENLIVE) LIQD, Take 237 mLs by mouth 2 (two) times daily between meals., Disp: 237 mL, Rfl: 12 .  HYDROcodone-acetaminophen (NORCO/VICODIN) 5-325 MG tablet, Take 1 tablet by mouth every 6 (six) hours as needed (For pain.). , Disp: , Rfl: 0 .  levothyroxine (SYNTHROID, LEVOTHROID) 100 MCG tablet, Take 100 mcg by mouth daily before breakfast. , Disp: , Rfl: 3 .  oxyCODONE-acetaminophen (PERCOCET/ROXICET) 5-325 MG tablet, Take 1 tablet by mouth every 4 (four) hours as needed for severe pain (For pain.). , Disp: , Rfl:  .  pantoprazole (PROTONIX) 40 MG tablet, Take 1 tablet (40 mg total) by mouth daily. (Patient not taking: Reported on 12/21/2017), Disp: 30 tablet, Rfl: 4 .  polyethylene glycol (MIRALAX / GLYCOLAX) packet, Take 17 g by mouth daily as needed for mild constipation. (Patient not taking: Reported on 12/21/2017), Disp: 14 each, Rfl: 0 .  Ribociclib Succinate 400 Dose 200 MG TBPK, Take by mouth., Disp: , Rfl:  .  tetrahydrozoline-zinc (VISINE-AC) 0.05-0.25 % ophthalmic solution, Place 2 drops  into both eyes 3 (three) times daily as needed (red eyes)., Disp: , Rfl:  .  Vitamin D, Ergocalciferol, (DRISDOL) 50000 units CAPS capsule, TAKE 1 CAPSULE BY MOUTH 1 TIME A WEEK, Disp: 12 capsule, Rfl: 3 .  warfarin (COUMADIN) 5 MG tablet, Take 2.5-5 mg by mouth every evening. Take one tablet on Tuesday and Thursday and half a tablet the rest of the week., Disp: , Rfl:   Allergies:  Allergies  Allergen Reactions  . Morphine And Related Rash  . Crestor [Rosuvastatin] Other (See Comments)    Reaction unknown  . Sulfa Antibiotics Other (See Comments)    Mouth breaks out in sores  . Ciprofloxacin Rash and Other (See Comments)      Rash in the mouth  . Penicillins Hives, Rash and Other (See Comments)    Has patient had a PCN reaction causing immediate rash, facial/tongue/throat swelling, SOB or lightheadedness with hypotension: yes Has patient had a PCN reaction causing severe rash involving mucus membranes or skin necrosis: no Has patient had a PCN reaction that required hospitalization: no Has patient had a PCN reaction occurring within the last 10 years: yes If all of the above answers are "NO", then may proceed with Cephalosporin use.   . Vancomycin Rash and Other (See Comments)    "Red-man"    Past Medical History, Surgical history, Social history, and Family History were reviewed and updated.  Review of Systems: Review of Systems  Constitutional: Negative.   HENT:  Negative.   Eyes: Negative.   Respiratory: Negative.   Cardiovascular: Negative.   Gastrointestinal: Negative.   Endocrine: Negative.   Genitourinary: Negative.    Musculoskeletal: Positive for back pain and myalgias.  Skin: Negative.   Neurological: Negative.   Hematological: Negative.   Psychiatric/Behavioral: Negative.     Physical Exam:  weight is 162 lb (73.5 kg). Her oral temperature is 98.2 F (36.8 C). Her blood pressure is 130/68 and her pulse is 89. Her respiration is 18 and oxygen saturation is 97%.   Wt Readings from Last 3 Encounters:  01/06/18 162 lb (73.5 kg)  12/21/17 158 lb (71.7 kg)  12/06/17 162 lb (73.5 kg)    Physical Exam  Constitutional: She is oriented to person, place, and time.  HENT:  Head: Normocephalic and atraumatic.  Mouth/Throat: Oropharynx is clear and moist.  Eyes: Pupils are equal, round, and reactive to light. EOM are normal.  Neck: Normal range of motion.  Cardiovascular: Normal rate, regular rhythm and normal heart sounds.  Pulmonary/Chest: Effort normal and breath sounds normal.  Abdominal: Soft. Bowel sounds are normal.  Musculoskeletal: Normal range of motion. She exhibits no edema,  tenderness or deformity.  Lymphadenopathy:    She has no cervical adenopathy.  Neurological: She is alert and oriented to person, place, and time.  Skin: Skin is warm and dry. No rash noted. No erythema.  Psychiatric: She has a normal mood and affect. Her behavior is normal. Judgment and thought content normal.  Vitals reviewed.    Lab Results  Component Value Date   WBC 1.5 (L) 01/06/2018   HGB 11.0 (L) 01/06/2018   HCT 33.5 (L) 01/06/2018   MCV 106.0 (H) 01/06/2018   PLT 114 (L) 01/06/2018     Chemistry      Component Value Date/Time   NA 141 01/06/2018 1437   NA 146 (H) 06/11/2017 1614   NA 146 (H) 01/25/2017 1448   NA 142 07/31/2015 1203   K 4.2 01/06/2018 1437  K 4.0 01/25/2017 1448   K 5.1 07/31/2015 1203   CL 108 01/06/2018 1437   CL 108 01/25/2017 1448   CO2 28 01/06/2018 1437   CO2 29 01/25/2017 1448   CO2 23 07/31/2015 1203   BUN 9 01/06/2018 1437   BUN 13 06/11/2017 1614   BUN 18 01/25/2017 1448   BUN 23.3 07/31/2015 1203   CREATININE 1.10 01/06/2018 1437   CREATININE 1.2 01/25/2017 1448   CREATININE 1.1 07/31/2015 1203      Component Value Date/Time   CALCIUM 9.5 01/06/2018 1437   CALCIUM 9.3 01/25/2017 1448   CALCIUM 9.3 07/31/2015 1203   ALKPHOS 146 (H) 01/06/2018 1437   ALKPHOS 64 01/25/2017 1448   ALKPHOS 57 07/31/2015 1203   AST 28 01/06/2018 1437   AST 18 07/31/2015 1203   ALT 22 01/06/2018 1437   ALT 20 01/25/2017 1448   ALT 15 07/31/2015 1203   BILITOT 2.0 (H) 01/06/2018 1437   BILITOT 0.96 07/31/2015 1203         Impression and Plan: Amy White is a 84 year old white female.  She has metastatic breast cancer.  It seems as if her disease is still confined to the bones.  We will go ahead and get a bone scan on her when we see her back next time.  By then, she will have been on ribociclib for about 3 months.  Hopefully, we will see her CA 27.29 going down.  If we find that the bone scan is worse, then I would switch the Aromasin  and then put her on Faslodex with ribociclib.  Again, we will see her back the first week in September.  So far, she is doing well with the ribociclib/Aromasin combination.  We will see what her CA 27.29 is.  I will plan to get her back in 4 weeks.  We will give her the Delton See will only see her back.Marland Kitchen   Volanda Napoleon, MD 8/1/20193:19 PM

## 2018-01-07 ENCOUNTER — Telehealth: Payer: Self-pay | Admitting: *Deleted

## 2018-01-07 LAB — CANCER ANTIGEN 27.29: CA 27.29: 54.2 U/mL — ABNORMAL HIGH (ref 0.0–38.6)

## 2018-01-07 NOTE — Telephone Encounter (Addendum)
Patient is aware of results.   ----- Message from Volanda Napoleon, MD sent at 01/07/2018 10:04 AM EDT ----- Call - the cancer level is stable -- 54.  This is low!!  Laurey Arrow

## 2018-01-11 ENCOUNTER — Ambulatory Visit (INDEPENDENT_AMBULATORY_CARE_PROVIDER_SITE_OTHER): Payer: Medicare Other | Admitting: *Deleted

## 2018-01-11 DIAGNOSIS — I495 Sick sinus syndrome: Secondary | ICD-10-CM | POA: Diagnosis not present

## 2018-01-11 DIAGNOSIS — R001 Bradycardia, unspecified: Secondary | ICD-10-CM

## 2018-01-12 ENCOUNTER — Ambulatory Visit (INDEPENDENT_AMBULATORY_CARE_PROVIDER_SITE_OTHER): Payer: Medicare Other | Admitting: Pharmacist Clinician (PhC)/ Clinical Pharmacy Specialist

## 2018-01-12 ENCOUNTER — Telehealth: Payer: Self-pay

## 2018-01-12 DIAGNOSIS — I4891 Unspecified atrial fibrillation: Secondary | ICD-10-CM | POA: Diagnosis not present

## 2018-01-12 DIAGNOSIS — Z7901 Long term (current) use of anticoagulants: Secondary | ICD-10-CM

## 2018-01-12 LAB — POCT INR: INR: 1.8 — AB (ref 2.0–3.0)

## 2018-01-12 NOTE — Progress Notes (Signed)
Remote pacemaker transmission.   

## 2018-01-12 NOTE — Telephone Encounter (Signed)
VM left for patient to reschedule missed visit with NP.

## 2018-01-13 ENCOUNTER — Telehealth: Payer: Self-pay

## 2018-01-13 NOTE — Telephone Encounter (Signed)
Received VM from patient's daughter regarding missed visit earlier this week. Return phone call placed to offer to reschedule visit. VM left

## 2018-01-20 ENCOUNTER — Telehealth: Payer: Self-pay | Admitting: Hematology & Oncology

## 2018-01-20 NOTE — Telephone Encounter (Signed)
lmom to inform pt of Bone Scan 8/20 at 11 am at [facility]

## 2018-01-25 ENCOUNTER — Encounter (HOSPITAL_COMMUNITY): Payer: Medicare Other

## 2018-01-26 ENCOUNTER — Other Ambulatory Visit: Payer: Medicare Other

## 2018-01-26 ENCOUNTER — Ambulatory Visit: Payer: Medicare Other | Admitting: Hematology & Oncology

## 2018-01-27 ENCOUNTER — Encounter (HOSPITAL_COMMUNITY)
Admission: RE | Admit: 2018-01-27 | Discharge: 2018-01-27 | Disposition: A | Discharge status: Home / Self Care | Payer: Medicare Other | Source: Ambulatory Visit | Attending: Hematology & Oncology | Admitting: Hematology & Oncology

## 2018-01-27 ENCOUNTER — Telehealth: Payer: Self-pay | Admitting: Pharmacist

## 2018-01-27 DIAGNOSIS — Z853 Personal history of malignant neoplasm of breast: Secondary | ICD-10-CM | POA: Diagnosis not present

## 2018-01-27 DIAGNOSIS — C7951 Secondary malignant neoplasm of bone: Secondary | ICD-10-CM | POA: Insufficient documentation

## 2018-01-27 DIAGNOSIS — C50911 Malignant neoplasm of unspecified site of right female breast: Secondary | ICD-10-CM | POA: Insufficient documentation

## 2018-01-27 MED ORDER — TECHNETIUM TC 99M MEDRONATE IV KIT
20.8000 | PACK | Freq: Once | INTRAVENOUS | Status: AC | PRN
  Administered 2018-01-27: 20.8 via INTRAVENOUS

## 2018-01-27 NOTE — Telephone Encounter (Signed)
Oral Chemotherapy Pharmacist Encounter   Attempted to reach patient for follow up on oral medication: Kisqali (ribociclib). No answer. Left VM for patient to call back.    Darl Pikes, PharmD, BCPS, [facility] Hematology/Oncology Clinical Pharmacist [facility] [phone]  01/27/2018 4:17 PM

## 2018-01-28 ENCOUNTER — Telehealth: Payer: Self-pay | Admitting: *Deleted

## 2018-01-28 LAB — CUP PACEART REMOTE DEVICE CHECK
Battery Remaining Longevity: 73 mo
Battery Voltage: 3 V
Brady Statistic AP VP Percent: 0.82 %
Brady Statistic AP VS Percent: 0.2 %
Brady Statistic AS VP Percent: 16.01 %
Brady Statistic AS VS Percent: 82.97 %
Brady Statistic RA Percent Paced: 0.77 %
Brady Statistic RV Percent Paced: 14.79 %
Date Time Interrogation Session: 20190806124347
Implantable Lead Implant Date: 20160223
Implantable Lead Implant Date: 20160223
Implantable Lead Location: 753859
Implantable Lead Location: 753860
Implantable Lead Model: 5076
Implantable Lead Model: 5076
Implantable Pulse Generator Implant Date: 20160223
Lead Channel Impedance Value: 285 Ohm
Lead Channel Impedance Value: 342 Ohm
Lead Channel Impedance Value: 399 Ohm
Lead Channel Impedance Value: 494 Ohm
Lead Channel Pacing Threshold Amplitude: 0.75 V
Lead Channel Pacing Threshold Amplitude: 0.875 V
Lead Channel Pacing Threshold Pulse Width: 0.4 ms
Lead Channel Pacing Threshold Pulse Width: 0.4 ms
Lead Channel Sensing Intrinsic Amplitude: 0.5 mV
Lead Channel Sensing Intrinsic Amplitude: 0.5 mV
Lead Channel Sensing Intrinsic Amplitude: 10.75 mV
Lead Channel Sensing Intrinsic Amplitude: 10.75 mV
Lead Channel Setting Pacing Amplitude: 1.5 V
Lead Channel Setting Pacing Amplitude: 2.25 V
Lead Channel Setting Pacing Pulse Width: 0.4 ms
Lead Channel Setting Sensing Sensitivity: 0.9 mV

## 2018-01-28 NOTE — Telephone Encounter (Signed)
-----   Message from Volanda Napoleon, MD sent at 01/27/2018  4:44 PM EDT ----- Call - the bone scan is stable!!  Nothing new!!!  Laurey Arrow

## 2018-02-01 NOTE — Telephone Encounter (Signed)
Oral Chemotherapy Pharmacist Encounter  Follow-Up Form  Called patient on 01/28/18 to follow up regarding patient's oral chemotherapy medication: Kisqali (ribociclib)  Original Start date of oral chemotherapy: 11/2017  Pt reports 0 tablets/doses of Kisqali missed in the last cycle.   Pt reports the following side effects: fatigue  Recent labs reviewed: CA 27.29 from 01/06/18  New medications?: None reported  Other Issues: None reported  Patient knows to call the office with questions or concerns. Oral Oncology Clinic will continue to follow.  Darl Pikes, PharmD, BCPS, BCOP Hematology/Oncology Clinical Pharmacist [facility] [phone]

## 2018-02-08 DIAGNOSIS — S32020A Wedge compression fracture of second lumbar vertebra, initial encounter for closed fracture: Secondary | ICD-10-CM | POA: Diagnosis not present

## 2018-02-08 DIAGNOSIS — Z6825 Body mass index (BMI) 25.0-25.9, adult: Secondary | ICD-10-CM | POA: Diagnosis not present

## 2018-02-08 DIAGNOSIS — I10 Essential (primary) hypertension: Secondary | ICD-10-CM | POA: Diagnosis not present

## 2018-02-09 ENCOUNTER — Other Ambulatory Visit: Payer: Self-pay

## 2018-02-09 ENCOUNTER — Inpatient Hospital Stay: Payer: Medicare Other

## 2018-02-09 ENCOUNTER — Inpatient Hospital Stay: Payer: Medicare Other | Attending: Hematology & Oncology | Admitting: Hematology & Oncology

## 2018-02-09 VITALS — BP 127/67 | HR 96 | Temp 97.7°F | Resp 20 | Wt 159.0 lb

## 2018-02-09 DIAGNOSIS — Z79811 Long term (current) use of aromatase inhibitors: Secondary | ICD-10-CM | POA: Diagnosis not present

## 2018-02-09 DIAGNOSIS — C7951 Secondary malignant neoplasm of bone: Secondary | ICD-10-CM | POA: Diagnosis not present

## 2018-02-09 DIAGNOSIS — I509 Heart failure, unspecified: Secondary | ICD-10-CM | POA: Diagnosis not present

## 2018-02-09 DIAGNOSIS — I2510 Atherosclerotic heart disease of native coronary artery without angina pectoris: Secondary | ICD-10-CM

## 2018-02-09 DIAGNOSIS — C50911 Malignant neoplasm of unspecified site of right female breast: Secondary | ICD-10-CM

## 2018-02-09 DIAGNOSIS — C50919 Malignant neoplasm of unspecified site of unspecified female breast: Secondary | ICD-10-CM

## 2018-02-09 DIAGNOSIS — Z7901 Long term (current) use of anticoagulants: Secondary | ICD-10-CM

## 2018-02-09 LAB — CMP (CANCER CENTER ONLY)
ALT: 24 U/L (ref 10–47)
AST: 28 U/L (ref 11–38)
Albumin: 3.4 g/dL — ABNORMAL LOW (ref 3.5–5.0)
Alkaline Phosphatase: 134 U/L — ABNORMAL HIGH (ref 26–84)
Anion gap: 5 (ref 5–15)
BUN: 13 mg/dL (ref 7–22)
CO2: 32 mmol/L (ref 18–33)
Calcium: 9.5 mg/dL (ref 8.0–10.3)
Chloride: 107 mmol/L (ref 98–108)
Creatinine: 1.3 mg/dL — ABNORMAL HIGH (ref 0.60–1.20)
Glucose, Bld: 84 mg/dL (ref 73–118)
Potassium: 4.8 mmol/L — ABNORMAL HIGH (ref 3.3–4.7)
Sodium: 144 mmol/L (ref 128–145)
Total Bilirubin: 2.4 mg/dL — ABNORMAL HIGH (ref 0.2–1.6)
Total Protein: 6.7 g/dL (ref 6.4–8.1)

## 2018-02-09 LAB — CBC WITH DIFFERENTIAL (CANCER CENTER ONLY)
Basophils Absolute: 0 10*3/uL (ref 0.0–0.1)
Basophils Relative: 2 %
Eosinophils Absolute: 0.1 10*3/uL (ref 0.0–0.5)
Eosinophils Relative: 4 %
HCT: 34.6 % — ABNORMAL LOW (ref 34.8–46.6)
Hemoglobin: 11.4 g/dL — ABNORMAL LOW (ref 11.6–15.9)
Lymphocytes Relative: 36 %
Lymphs Abs: 0.6 10*3/uL — ABNORMAL LOW (ref 0.9–3.3)
MCH: 34.1 pg — ABNORMAL HIGH (ref 26.0–34.0)
MCHC: 32.9 g/dL (ref 32.0–36.0)
MCV: 103.6 fL — ABNORMAL HIGH (ref 81.0–101.0)
Monocytes Absolute: 0.2 10*3/uL (ref 0.1–0.9)
Monocytes Relative: 10 %
Neutro Abs: 0.9 10*3/uL — ABNORMAL LOW (ref 1.5–6.5)
Neutrophils Relative %: 48 %
Platelet Count: 116 10*3/uL — ABNORMAL LOW (ref 145–400)
RBC: 3.34 MIL/uL — ABNORMAL LOW (ref 3.70–5.32)
RDW: 16.5 % — ABNORMAL HIGH (ref 11.1–15.7)
WBC Count: 1.8 10*3/uL — ABNORMAL LOW (ref 3.9–10.0)

## 2018-02-09 NOTE — Progress Notes (Signed)
Hematology and Oncology Follow Up Visit  Amy White [id number] 06/28/1925 84 y.o. 02/09/2018   Principle Diagnosis:   Metastatic breast cancer-bone only metastasis  Current Therapy:    Aromasin 25 mg p.o. Daily  Ribociclib 400 mg po q day (21/14)  Xgeva 120 mg sq q 3 months - next dose 03/2018      Interim History:  Amy White is back for follow-up.  She seems to be doing okay.  Unfortunately, she seems to be losing her hair.  I would have to believe that this is probably from the ribociclib.  I think this is an unusual side effect of ribociclib.  We did go ahead and do a bone scan on her.  This basically shows that there is no evidence of progressive disease.  Everything looked very stable with the right sixth rib, right 10th rib, L2.  There is no activity over T12 or T6.  Her last CA 27.29 was stable at 54.Marland Kitchen  She has horrible arthritis.  Para she has coronary artery disease.  She has congestive heart failure.  She is on Coumadin.  Clearly, our goal here is quality of life.  We have multiple diseases.  We just want to make sure that she is able to tolerate the ribociclib.  Her white cell count is on the low side.  I think if we plan to give her 2 weeks off in between 21-day treatments.  Overall, her performance status is ECOG 2.    Medications:  Current Outpatient Medications:  .  acetaminophen (TYLENOL) 500 MG tablet, Take 500 mg by mouth every 6 (six) hours as needed for fever., Disp: , Rfl:  .  albuterol (PROVENTIL HFA;VENTOLIN HFA) 108 (90 BASE) MCG/ACT inhaler, Inhale 2 puffs into the lungs every 6 (six) hours as needed for wheezing or shortness of breath., Disp: , Rfl:  .  ALPRAZolam (XANAX) 0.5 MG tablet, Take 0.25 mg by mouth 2 (two) times daily as needed for anxiety. , Disp: , Rfl: 0 .  bisoprolol (ZEBETA) 5 MG tablet, Take 1 tablet (5 mg total) by mouth daily., Disp: 90 tablet, Rfl: 3 .  diphenoxylate-atropine (LOMOTIL) 2.5-0.025 MG tablet, Take one tablet after  each loose stool. Maximum of 8 tablets per day. (Patient taking differently: Take 1 tablet by mouth 4 (four) times daily as needed for diarrhea or loose stools. Maximum of 8 tablets per day.), Disp: 30 tablet, Rfl: 1 .  exemestane (AROMASIN) 25 MG tablet, Take 1 tablet (25 mg total) by mouth daily after breakfast., Disp: 30 tablet, Rfl: 12 .  feeding supplement, ENSURE ENLIVE, (ENSURE ENLIVE) LIQD, Take 237 mLs by mouth 2 (two) times daily between meals., Disp: 237 mL, Rfl: 12 .  HYDROcodone-acetaminophen (NORCO/VICODIN) 5-325 MG tablet, Take 1 tablet by mouth every 6 (six) hours as needed (For pain.). , Disp: , Rfl: 0 .  levothyroxine (SYNTHROID, LEVOTHROID) 100 MCG tablet, Take 100 mcg by mouth daily before breakfast. , Disp: , Rfl: 3 .  oxyCODONE-acetaminophen (PERCOCET/ROXICET) 5-325 MG tablet, Take 1 tablet by mouth every 4 (four) hours as needed for severe pain (For pain.). , Disp: , Rfl:  .  pantoprazole (PROTONIX) 40 MG tablet, Take 1 tablet (40 mg total) by mouth daily., Disp: 30 tablet, Rfl: 4 .  polyethylene glycol (MIRALAX / GLYCOLAX) packet, Take 17 g by mouth daily as needed for mild constipation., Disp: 14 each, Rfl: 0 .  Ribociclib Succinate 400 Dose 200 MG TBPK, Take by mouth., Disp: , Rfl:  .  tetrahydrozoline-zinc (VISINE-AC) 0.05-0.25 % ophthalmic solution, Place 2 drops into both eyes 3 (three) times daily as needed (red eyes)., Disp: , Rfl:  .  Vitamin D, Ergocalciferol, (DRISDOL) 50000 units CAPS capsule, TAKE 1 CAPSULE BY MOUTH 1 TIME A WEEK, Disp: 12 capsule, Rfl: 3 .  warfarin (COUMADIN) 5 MG tablet, Take 2.5-5 mg by mouth every evening. Take one tablet on Tuesday and Thursday and half a tablet the rest of the week., Disp: , Rfl:   Allergies:  Allergies  Allergen Reactions  . Morphine And Related Rash  . Crestor [Rosuvastatin] Other (See Comments)    Reaction unknown  . Sulfa Antibiotics Other (See Comments)    Mouth breaks out in sores  . Ciprofloxacin Rash and Other  (See Comments)    Rash in the mouth  . Penicillins Hives, Rash and Other (See Comments)    Has patient had a PCN reaction causing immediate rash, facial/tongue/throat swelling, SOB or lightheadedness with hypotension: yes Has patient had a PCN reaction causing severe rash involving mucus membranes or skin necrosis: no Has patient had a PCN reaction that required hospitalization: no Has patient had a PCN reaction occurring within the last 10 years: yes If all of the above answers are "NO", then may proceed with Cephalosporin use.   . Vancomycin Rash and Other (See Comments)    "Red-man"    Past Medical History, Surgical history, Social history, and Family History were reviewed and updated.  Review of Systems: Review of Systems  Constitutional: Negative.   HENT:  Negative.   Eyes: Negative.   Respiratory: Negative.   Cardiovascular: Negative.   Gastrointestinal: Negative.   Endocrine: Negative.   Genitourinary: Negative.    Musculoskeletal: Positive for back pain and myalgias.  Skin: Negative.   Neurological: Negative.   Hematological: Negative.   Psychiatric/Behavioral: Negative.     Physical Exam:  weight is 159 lb (72.1 kg). Her oral temperature is 97.7 F (36.5 C). Her blood pressure is 127/67 and her pulse is 96. Her respiration is 20 and oxygen saturation is 97%.   Wt Readings from Last 3 Encounters:  02/09/18 159 lb (72.1 kg)  01/06/18 162 lb (73.5 kg)  12/21/17 158 lb (71.7 kg)    Physical Exam  Constitutional: She is oriented to person, place, and time.  HENT:  Head: Normocephalic and atraumatic.  Mouth/Throat: Oropharynx is clear and moist.  Eyes: Pupils are equal, round, and reactive to light. EOM are normal.  Neck: Normal range of motion.  Cardiovascular: Normal rate, regular rhythm and normal heart sounds.  Pulmonary/Chest: Effort normal and breath sounds normal.  Abdominal: Soft. Bowel sounds are normal.  Musculoskeletal: Normal range of motion. She  exhibits no edema, tenderness or deformity.  Lymphadenopathy:    She has no cervical adenopathy.  Neurological: She is alert and oriented to person, place, and time.  Skin: Skin is warm and dry. No rash noted. No erythema.  Psychiatric: She has a normal mood and affect. Her behavior is normal. Judgment and thought content normal.  Vitals reviewed.    Lab Results  Component Value Date   WBC 1.8 (L) 02/09/2018   HGB 11.4 (L) 02/09/2018   HCT 34.6 (L) 02/09/2018   MCV 103.6 (H) 02/09/2018   PLT 116 (L) 02/09/2018     Chemistry      Component Value Date/Time   NA 141 01/06/2018 1437   NA 146 (H) 06/11/2017 1614   NA 146 (H) 01/25/2017 1448   NA 142 07/31/2015 1203  K 4.2 01/06/2018 1437   K 4.0 01/25/2017 1448   K 5.1 07/31/2015 1203   CL 108 01/06/2018 1437   CL 108 01/25/2017 1448   CO2 28 01/06/2018 1437   CO2 29 01/25/2017 1448   CO2 23 07/31/2015 1203   BUN 9 01/06/2018 1437   BUN 13 06/11/2017 1614   BUN 18 01/25/2017 1448   BUN 23.3 07/31/2015 1203   CREATININE 1.10 01/06/2018 1437   CREATININE 1.2 01/25/2017 1448   CREATININE 1.1 07/31/2015 1203      Component Value Date/Time   CALCIUM 9.5 01/06/2018 1437   CALCIUM 9.3 01/25/2017 1448   CALCIUM 9.3 07/31/2015 1203   ALKPHOS 146 (H) 01/06/2018 1437   ALKPHOS 64 01/25/2017 1448   ALKPHOS 57 07/31/2015 1203   AST 28 01/06/2018 1437   AST 18 07/31/2015 1203   ALT 22 01/06/2018 1437   ALT 20 01/25/2017 1448   ALT 15 07/31/2015 1203   BILITOT 2.0 (H) 01/06/2018 1437   BILITOT 0.96 07/31/2015 1203         Impression and Plan: Amy White is a 84 year old white female.  She has metastatic breast cancer.  It seems as if her disease is still confined to the bones.  I think that everything does look quite stable.  Hopefully, her breast cancer will continue to not cause her problems.  I would like to think that her cardiac issues will be her determinant of her prognosis.  She sees her cardiologist this  Friday.  I would like to see her back in about 4 weeks.  We will see her white cell count looks at that time.  If it is still on the low side, we might have to decrease the ribociclib dose a little bit more.   Volanda Napoleon, MD 9/4/20192:31 PM

## 2018-02-10 LAB — CANCER ANTIGEN 27.29: CA 27.29: 51.6 U/mL — ABNORMAL HIGH (ref 0.0–38.6)

## 2018-02-10 MED FILL — KISQALI 400 MG DAILY DOSE: 200 | 28 days supply | Qty: 42 | Fill #3

## 2018-02-11 ENCOUNTER — Ambulatory Visit (INDEPENDENT_AMBULATORY_CARE_PROVIDER_SITE_OTHER): Payer: Medicare Other | Admitting: Pharmacist Clinician (PhC)/ Clinical Pharmacy Specialist

## 2018-02-11 ENCOUNTER — Encounter: Payer: Self-pay | Admitting: Cardiovascular Disease

## 2018-02-11 ENCOUNTER — Ambulatory Visit (INDEPENDENT_AMBULATORY_CARE_PROVIDER_SITE_OTHER): Payer: Medicare Other | Admitting: Cardiovascular Disease

## 2018-02-11 VITALS — BP 122/66 | HR 95 | Ht 66.0 in | Wt 160.2 lb

## 2018-02-11 DIAGNOSIS — I481 Persistent atrial fibrillation: Secondary | ICD-10-CM

## 2018-02-11 DIAGNOSIS — I4819 Other persistent atrial fibrillation: Secondary | ICD-10-CM

## 2018-02-11 DIAGNOSIS — Z950 Presence of cardiac pacemaker: Secondary | ICD-10-CM | POA: Diagnosis not present

## 2018-02-11 DIAGNOSIS — I495 Sick sinus syndrome: Secondary | ICD-10-CM

## 2018-02-11 DIAGNOSIS — Z7901 Long term (current) use of anticoagulants: Secondary | ICD-10-CM

## 2018-02-11 DIAGNOSIS — I4891 Unspecified atrial fibrillation: Secondary | ICD-10-CM

## 2018-02-11 LAB — POCT INR: INR: 1.8 — AB (ref 2.0–3.0)

## 2018-02-11 MED ORDER — BISOPROLOL FUMARATE 5 MG PO TABS: 7.5000 mg | ORAL_TABLET | Freq: Every day | ORAL | 3 refills | Status: DC

## 2018-02-11 NOTE — Patient Instructions (Signed)
Dr Sallyanne Kuster has recommended making the following medication changes: 1. INCREASE Bisoprolol to 7.5 mg daily  Remote monitoring is used to monitor your Pacemaker or ICD from home. This monitoring reduces the number of office visits required to check your device to one time per year. It allows [place] to keep an eye on the functioning of your device to ensure it is working properly. You are scheduled for a device check from home on Tuesday, November 5th, 2019. You may send your transmission at any time that day. If you have a wireless device, the transmission will be sent automatically. After your physician reviews your transmission, you will receive a postcard with your next transmission date.  To improve our patient care and to more adequately follow your device, [facility] has decided, as a practice, to start following each patient four times a year with your home monitor. This means that you may experience a remote appointment that is close to an in-office appointment with your physician. Your insurance will apply at the same rate as other remote monitoring transmissions.  Dr Sallyanne Kuster recommends that you schedule a follow-up appointment in 6 months with a pacemaker check. You will receive a reminder letter in the mail two months in advance. If you don't receive a letter, please call our office to schedule the follow-up appointment.  If you need a refill on your cardiac medications before your next appointment, please call your pharmacy.
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
## 2018-02-12 ENCOUNTER — Encounter: Payer: Self-pay | Admitting: Cardiovascular Disease

## 2018-02-17 ENCOUNTER — Other Ambulatory Visit: Payer: Self-pay | Admitting: Family Medicine

## 2018-02-17 DIAGNOSIS — H109 Unspecified conjunctivitis: Secondary | ICD-10-CM | POA: Diagnosis not present

## 2018-02-17 DIAGNOSIS — C44329 Squamous cell carcinoma of skin of other parts of face: Secondary | ICD-10-CM | POA: Diagnosis not present

## 2018-03-03 ENCOUNTER — Other Ambulatory Visit: Payer: Self-pay | Admitting: Hematology & Oncology

## 2018-03-09 ENCOUNTER — Other Ambulatory Visit: Payer: Self-pay | Admitting: *Deleted

## 2018-03-09 ENCOUNTER — Inpatient Hospital Stay: Payer: Medicare Other

## 2018-03-09 ENCOUNTER — Inpatient Hospital Stay: Payer: Medicare Other | Attending: Hematology & Oncology | Admitting: Hematology & Oncology

## 2018-03-09 ENCOUNTER — Other Ambulatory Visit: Payer: Self-pay

## 2018-03-09 ENCOUNTER — Encounter: Payer: Self-pay | Admitting: Hematology & Oncology

## 2018-03-09 VITALS — BP 126/71 | HR 84 | Temp 98.2°F | Resp 20 | Wt 162.8 lb

## 2018-03-09 DIAGNOSIS — C50919 Malignant neoplasm of unspecified site of unspecified female breast: Secondary | ICD-10-CM | POA: Insufficient documentation

## 2018-03-09 DIAGNOSIS — C7951 Secondary malignant neoplasm of bone: Secondary | ICD-10-CM | POA: Insufficient documentation

## 2018-03-09 DIAGNOSIS — R600 Localized edema: Secondary | ICD-10-CM | POA: Diagnosis not present

## 2018-03-09 DIAGNOSIS — I4891 Unspecified atrial fibrillation: Secondary | ICD-10-CM | POA: Diagnosis not present

## 2018-03-09 DIAGNOSIS — Z7901 Long term (current) use of anticoagulants: Secondary | ICD-10-CM | POA: Diagnosis not present

## 2018-03-09 DIAGNOSIS — C50911 Malignant neoplasm of unspecified site of right female breast: Secondary | ICD-10-CM | POA: Diagnosis not present

## 2018-03-09 DIAGNOSIS — Z170 Estrogen receptor positive status [ER+]: Secondary | ICD-10-CM | POA: Insufficient documentation

## 2018-03-09 DIAGNOSIS — Z79811 Long term (current) use of aromatase inhibitors: Secondary | ICD-10-CM

## 2018-03-09 DIAGNOSIS — Z881 Allergy status to other antibiotic agents status: Secondary | ICD-10-CM | POA: Diagnosis not present

## 2018-03-09 DIAGNOSIS — Z79899 Other long term (current) drug therapy: Secondary | ICD-10-CM | POA: Diagnosis not present

## 2018-03-09 DIAGNOSIS — L658 Other specified nonscarring hair loss: Secondary | ICD-10-CM

## 2018-03-09 DIAGNOSIS — M549 Dorsalgia, unspecified: Secondary | ICD-10-CM | POA: Diagnosis not present

## 2018-03-09 DIAGNOSIS — T50905A Adverse effect of unspecified drugs, medicaments and biological substances, initial encounter: Secondary | ICD-10-CM

## 2018-03-09 DIAGNOSIS — M1990 Unspecified osteoarthritis, unspecified site: Secondary | ICD-10-CM | POA: Diagnosis not present

## 2018-03-09 LAB — CBC WITH DIFFERENTIAL (CANCER CENTER ONLY)
Basophils Absolute: 0.1 10*3/uL (ref 0.0–0.1)
Basophils Relative: 3 %
Eosinophils Absolute: 0 10*3/uL (ref 0.0–0.5)
Eosinophils Relative: 2 %
HCT: 33.7 % — ABNORMAL LOW (ref 34.8–46.6)
Hemoglobin: 10.8 g/dL — ABNORMAL LOW (ref 11.6–15.9)
Lymphocytes Relative: 32 %
Lymphs Abs: 0.6 10*3/uL — ABNORMAL LOW (ref 0.9–3.3)
MCH: 34.5 pg — ABNORMAL HIGH (ref 26.0–34.0)
MCHC: 32 g/dL (ref 32.0–36.0)
MCV: 107.7 fL — ABNORMAL HIGH (ref 81.0–101.0)
Monocytes Absolute: 0.3 10*3/uL (ref 0.1–0.9)
Monocytes Relative: 19 %
Neutro Abs: 0.8 10*3/uL — ABNORMAL LOW (ref 1.5–6.5)
Neutrophils Relative %: 44 %
Platelet Count: 112 10*3/uL — ABNORMAL LOW (ref 145–400)
RBC: 3.13 MIL/uL — ABNORMAL LOW (ref 3.70–5.32)
RDW: 18.5 % — ABNORMAL HIGH (ref 11.1–15.7)
WBC Count: 1.8 10*3/uL — ABNORMAL LOW (ref 3.9–10.0)

## 2018-03-09 LAB — CMP (CANCER CENTER ONLY)
ALT: 27 U/L (ref 10–47)
AST: 29 U/L (ref 11–38)
Albumin: 3.5 g/dL (ref 3.5–5.0)
Alkaline Phosphatase: 127 U/L — ABNORMAL HIGH (ref 26–84)
Anion gap: 0 — ABNORMAL LOW (ref 5–15)
BUN: 13 mg/dL (ref 7–22)
CO2: 31 mmol/L (ref 18–33)
Calcium: 9.3 mg/dL (ref 8.0–10.3)
Chloride: 111 mmol/L — ABNORMAL HIGH (ref 98–108)
Creatinine: 1.1 mg/dL (ref 0.60–1.20)
Glucose, Bld: 113 mg/dL (ref 73–118)
Potassium: 4.7 mmol/L (ref 3.3–4.7)
Sodium: 138 mmol/L (ref 128–145)
Total Bilirubin: 2.4 mg/dL — ABNORMAL HIGH (ref 0.2–1.6)
Total Protein: 6.4 g/dL (ref 6.4–8.1)

## 2018-03-09 LAB — SAVE SMEAR

## 2018-03-09 MED ORDER — UNABLE TO FIND: 0 refills | Status: DC

## 2018-03-09 MED ORDER — RIBOCICLIB SUCC (400 MG DOSE) 200 MG PO TBPK: 200.0000 mg | ORAL_TABLET | Freq: Every day | ORAL | 3 refills | Status: DC

## 2018-03-09 NOTE — Progress Notes (Signed)
Hematology and Oncology Follow Up Visit  Amy White [id number] 1926-02-04 84 y.o. 03/09/2018   Principle Diagnosis:   Metastatic breast cancer-bone only metastasis  Current Therapy:    Aromasin 25 mg p.o. Daily  Ribociclib 200 mg po q day (21/14) -- changed on 03/27/2018  Xgeva 120 mg sq q 3 months - next dose 03/2018      Interim History:  Amy White is back for follow-up.  Unfortunately, is a looks like she is having some problems with the ribociclib.  Her white cell count is only 1.8.  She just restarted the ribociclib 2 days ago.  She has been off it for 2 weeks.  We are going to have to make another dosage adjustment.  We will have to get the dose down to 200 mg a day for 21 days.  Thankfully, her CA 27.29 is holding steady.  Only last checked it a month ago, the CA 27.29 was 52.  She does not complain of any kind of bony pain.  She is having some swelling in her legs.  I think this is from her atrial fibrillation.  She has had no fever.  She is had no change in bowel or bladder habits.  She has had no cough.  She has terrible arthritis.  This really has been the determinant of her quality of life.    Overall, her performance status is ECOG 2.    Medications:  Current Outpatient Medications:  .  acetaminophen (TYLENOL) 500 MG tablet, Take 500 mg by mouth every 6 (six) hours as needed for fever., Disp: , Rfl:  .  albuterol (PROVENTIL HFA;VENTOLIN HFA) 108 (90 BASE) MCG/ACT inhaler, Inhale 2 puffs into the lungs every 6 (six) hours as needed for wheezing or shortness of breath., Disp: , Rfl:  .  ALPRAZolam (XANAX) 0.5 MG tablet, Take 0.25 mg by mouth 2 (two) times daily as needed for anxiety. , Disp: , Rfl: 0 .  bisoprolol (ZEBETA) 5 MG tablet, Take 1.5 tablets (7.5 mg total) by mouth daily., Disp: 135 tablet, Rfl: 3 .  diphenoxylate-atropine (LOMOTIL) 2.5-0.025 MG tablet, Take one tablet after each loose stool. Maximum of 8 tablets per day. (Patient taking  differently: Take 1 tablet by mouth 4 (four) times daily as needed for diarrhea or loose stools. Maximum of 8 tablets per day.), Disp: 30 tablet, Rfl: 1 .  exemestane (AROMASIN) 25 MG tablet, Take 1 tablet (25 mg total) by mouth daily after breakfast., Disp: 30 tablet, Rfl: 12 .  feeding supplement, ENSURE ENLIVE, (ENSURE ENLIVE) LIQD, Take 237 mLs by mouth 2 (two) times daily between meals., Disp: 237 mL, Rfl: 12 .  furosemide (LASIX) 40 MG tablet, Take 40 mg by mouth daily as needed., Disp: , Rfl:  .  HYDROcodone-acetaminophen (NORCO/VICODIN) 5-325 MG tablet, Take 1 tablet by mouth every 6 (six) hours as needed (For pain.). , Disp: , Rfl: 0 .  KISQALI, 400 MG DOSE, 200 MG TBPK, TAKE 400 MG BY MOUTH DAILY. TAKE 2 PILLS DAILY FOR 21 DAYS ON AND 7 DAYS OFF, Disp: 42 each, Rfl: 3 .  levothyroxine (SYNTHROID, LEVOTHROID) 100 MCG tablet, Take 100 mcg by mouth daily before breakfast. , Disp: , Rfl: 3 .  oxyCODONE-acetaminophen (PERCOCET/ROXICET) 5-325 MG tablet, Take 1 tablet by mouth every 4 (four) hours as needed for severe pain (For pain.). , Disp: , Rfl:  .  polyethylene glycol (MIRALAX / GLYCOLAX) packet, Take 17 g by mouth daily as needed for mild constipation., Disp: 14  each, Rfl: 0 .  tetrahydrozoline-zinc (VISINE-AC) 0.05-0.25 % ophthalmic solution, Place 2 drops into both eyes 3 (three) times daily as needed (red eyes)., Disp: , Rfl:  .  Vitamin D, Ergocalciferol, (DRISDOL) 50000 units CAPS capsule, TAKE 1 CAPSULE BY MOUTH 1 TIME A WEEK, Disp: 12 capsule, Rfl: 3 .  warfarin (COUMADIN) 5 MG tablet, Take 2.5-5 mg by mouth every evening. Take one tablet on Tuesday and Thursday and half a tablet the rest of the week., Disp: , Rfl:  .  pantoprazole (PROTONIX) 40 MG tablet, Take 1 tablet (40 mg total) by mouth daily. (Patient not taking: Reported on 03/09/2018), Disp: 30 tablet, Rfl: 4 .  Ribociclib Succinate 400 Dose 200 MG TBPK, Take by mouth., Disp: , Rfl:  .  UNABLE TO FIND, Hair prosthesis, Disp: 1  each, Rfl: 0  Allergies:  Allergies  Allergen Reactions  . Morphine And Related Rash  . Crestor [Rosuvastatin] Other (See Comments)    Reaction unknown  . Sulfa Antibiotics Other (See Comments)    Mouth breaks out in sores  . Ciprofloxacin Rash and Other (See Comments)    Rash in the mouth  . Penicillins Hives, Rash and Other (See Comments)    Has patient had a PCN reaction causing immediate rash, facial/tongue/throat swelling, SOB or lightheadedness with hypotension: yes Has patient had a PCN reaction causing severe rash involving mucus membranes or skin necrosis: no Has patient had a PCN reaction that required hospitalization: no Has patient had a PCN reaction occurring within the last 10 years: yes If all of the above answers are "NO", then may proceed with Cephalosporin use.   . Vancomycin Rash and Other (See Comments)    "Red-man"    Past Medical History, Surgical history, Social history, and Family History were reviewed and updated.  Review of Systems: Review of Systems  Constitutional: Negative.   HENT:  Negative.   Eyes: Negative.   Respiratory: Negative.   Cardiovascular: Negative.   Gastrointestinal: Negative.   Endocrine: Negative.   Genitourinary: Negative.    Musculoskeletal: Positive for back pain and myalgias.  Skin: Negative.   Neurological: Negative.   Hematological: Negative.   Psychiatric/Behavioral: Negative.     Physical Exam:  weight is 162 lb 12.8 oz (73.8 kg). Her oral temperature is 98.2 F (36.8 C). Her blood pressure is 126/71 and her pulse is 84. Her respiration is 20 and oxygen saturation is 98%.   Wt Readings from Last 3 Encounters:  03/09/18 162 lb 12.8 oz (73.8 kg)  02/11/18 160 lb 3.2 oz (72.7 kg)  02/09/18 159 lb (72.1 kg)    Physical Exam  Constitutional: She is oriented to person, place, and time.  HENT:  Head: Normocephalic and atraumatic.  Mouth/Throat: Oropharynx is clear and moist.  Eyes: Pupils are equal, round, and  reactive to light. EOM are normal.  Neck: Normal range of motion.  Cardiovascular: Normal rate, regular rhythm and normal heart sounds.  Pulmonary/Chest: Effort normal and breath sounds normal.  Abdominal: Soft. Bowel sounds are normal.  Musculoskeletal: Normal range of motion. She exhibits no edema, tenderness or deformity.  Lymphadenopathy:    She has no cervical adenopathy.  Neurological: She is alert and oriented to person, place, and time.  Skin: Skin is warm and dry. No rash noted. No erythema.  Psychiatric: She has a normal mood and affect. Her behavior is normal. Judgment and thought content normal.  Vitals reviewed.    Lab Results  Component Value Date   WBC  1.8 (L) 03/09/2018   HGB 10.8 (L) 03/09/2018   HCT 33.7 (L) 03/09/2018   MCV 107.7 (H) 03/09/2018   PLT 112 (L) 03/09/2018     Chemistry      Component Value Date/Time   NA 138 03/09/2018 1430   NA 146 (H) 06/11/2017 1614   NA 146 (H) 01/25/2017 1448   NA 142 07/31/2015 1203   K 4.7 03/09/2018 1430   K 4.0 01/25/2017 1448   K 5.1 07/31/2015 1203   CL 111 (H) 03/09/2018 1430   CL 108 01/25/2017 1448   CO2 31 03/09/2018 1430   CO2 29 01/25/2017 1448   CO2 23 07/31/2015 1203   BUN 13 03/09/2018 1430   BUN 13 06/11/2017 1614   BUN 18 01/25/2017 1448   BUN 23.3 07/31/2015 1203   CREATININE 1.10 03/09/2018 1430   CREATININE 1.2 01/25/2017 1448   CREATININE 1.1 07/31/2015 1203      Component Value Date/Time   CALCIUM 9.3 03/09/2018 1430   CALCIUM 9.3 01/25/2017 1448   CALCIUM 9.3 07/31/2015 1203   ALKPHOS 127 (H) 03/09/2018 1430   ALKPHOS 64 01/25/2017 1448   ALKPHOS 57 07/31/2015 1203   AST 29 03/09/2018 1430   AST 18 07/31/2015 1203   ALT 27 03/09/2018 1430   ALT 20 01/25/2017 1448   ALT 15 07/31/2015 1203   BILITOT 2.4 (H) 03/09/2018 1430   BILITOT 0.96 07/31/2015 1203         Impression and Plan: Amy White is a 84 year old white female.  She has metastatic breast cancer.  It seems as if  her disease is still confined to the bones.  Again, we will have to make another dosage adjustment with the ribociclib.  We will put her dose down to 200 mg a day.  I told her not to take the ribociclib until next Monday and then going take it for 14 days.  If she does not rebound with her white cells, we does may have to drop the ribociclib.  She is been on the other CDK4/CDK6 inhibitors.  She is had problems with leukopenia from them.  If we cannot use ribociclib in the future, I just may have to switch her over to Faslodex.  I want her back in 4 weeks.  I want to make sure that we give her blood counts a chance to recover.     Volanda Napoleon, MD 10/2/20193:35 PM

## 2018-03-10 LAB — CANCER ANTIGEN 27.29: CA 27.29: 53.9 U/mL — ABNORMAL HIGH (ref 0.0–38.6)

## 2018-03-17 DIAGNOSIS — H01024 Squamous blepharitis left upper eyelid: Secondary | ICD-10-CM | POA: Diagnosis not present

## 2018-03-17 DIAGNOSIS — H01021 Squamous blepharitis right upper eyelid: Secondary | ICD-10-CM | POA: Diagnosis not present

## 2018-03-17 DIAGNOSIS — H01025 Squamous blepharitis left lower eyelid: Secondary | ICD-10-CM | POA: Diagnosis not present

## 2018-03-17 DIAGNOSIS — H01022 Squamous blepharitis right lower eyelid: Secondary | ICD-10-CM | POA: Diagnosis not present

## 2018-03-21 ENCOUNTER — Telehealth: Payer: Self-pay | Admitting: Cardiovascular Disease
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
## 2018-03-21 NOTE — Telephone Encounter (Signed)
Call received from Dr.Martin-Watson pt's on call physician.  Dr.Watson is currently at the pt's home, she is requesting the pt be seen soon for evaluation. The pt  has persistent AFib, she is having increased sob and irregular rate. appt scheduled with Doreene Adas, PA for tomorrow at 3:30pm. Pt aware of appt, adv her to bring her updated med list with her to that appt. Dr.Watson sts that their service is 24 hours the pt can call them to come back to her home overnight if needed.

## 2018-03-22 ENCOUNTER — Ambulatory Visit: Payer: Medicare Other | Admitting: Physician Assistant

## 2018-03-22 ENCOUNTER — Encounter: Payer: Self-pay | Admitting: Physician Assistant

## 2018-03-22 ENCOUNTER — Ambulatory Visit (INDEPENDENT_AMBULATORY_CARE_PROVIDER_SITE_OTHER): Payer: Medicare Other | Admitting: Pharmacist Clinician (PhC)/ Clinical Pharmacy Specialist

## 2018-03-22 ENCOUNTER — Encounter: Payer: Self-pay | Admitting: Cardiovascular Disease

## 2018-03-22 VITALS — BP 112/74 | HR 92 | Ht 66.0 in | Wt 161.2 lb

## 2018-03-22 DIAGNOSIS — I4811 Longstanding persistent atrial fibrillation: Secondary | ICD-10-CM | POA: Diagnosis not present

## 2018-03-22 DIAGNOSIS — I4891 Unspecified atrial fibrillation: Secondary | ICD-10-CM

## 2018-03-22 DIAGNOSIS — I10 Essential (primary) hypertension: Secondary | ICD-10-CM

## 2018-03-22 DIAGNOSIS — R0600 Dyspnea, unspecified: Secondary | ICD-10-CM

## 2018-03-22 DIAGNOSIS — I495 Sick sinus syndrome: Secondary | ICD-10-CM | POA: Diagnosis not present

## 2018-03-22 DIAGNOSIS — R0609 Other forms of dyspnea: Secondary | ICD-10-CM

## 2018-03-22 LAB — POCT INR: INR: 1.9 — AB (ref 2.0–3.0)

## 2018-03-22 NOTE — Patient Instructions (Signed)
Medication Instructions:  Take Lasix 20 mg in the am for 3 days starting tomorrow. Monitor shortness of breath If you need a refill on your cardiac medications before your next appointment, please call your pharmacy.   Lab work: BMET, CBC, BNP If you have labs (blood work) drawn today and your tests are completely normal, you will receive your results only by: Marland Kitchen MyChart Message (if you have MyChart) OR . A paper copy in the mail If you have any lab test that is abnormal or we need to change your treatment, we will call you to review the results.  Testing/Procedures: None Ordered.  Follow-Up: At [facility], you and your health needs are our priority.  As part of our continuing mission to provide you with exceptional heart care, we have created designated Provider Care Teams.  These Care Teams include your primary Cardiologist (physician) and Advanced Practice Providers (APPs -  Physician Assistants and Nurse Practitioners) who all work together to provide you with the care you need, when you need it. Marland Kitchen Keep follow up as scheduled with Amy Sims, NP   Any Other Special Instructions Will Be Listed Below (If Applicable). None

## 2018-03-23 LAB — BASIC METABOLIC PANEL
BUN/Creatinine Ratio: 13 (ref 12–28)
BUN: 13 mg/dL (ref 10–36)
CO2: 27 mmol/L (ref 20–29)
Calcium: 9 mg/dL (ref 8.7–10.3)
Chloride: 103 mmol/L (ref 96–106)
Creatinine, Ser: 0.99 mg/dL (ref 0.57–1.00)
GFR calc Af Amer: 58 mL/min/{1.73_m2} — ABNORMAL LOW (ref >59)
GFR calc non Af Amer: 50 mL/min/{1.73_m2} — ABNORMAL LOW (ref >59)
Glucose: 126 mg/dL — ABNORMAL HIGH (ref 65–99)
Potassium: 5.1 mmol/L (ref 3.5–5.2)
Sodium: 142 mmol/L (ref 134–144)

## 2018-03-23 LAB — CBC
Hematocrit: 33.3 % — ABNORMAL LOW (ref 34.0–46.6)
Hemoglobin: 11 g/dL — ABNORMAL LOW (ref 11.1–15.9)
MCH: 34.2 pg — ABNORMAL HIGH (ref 26.6–33.0)
MCHC: 33 g/dL (ref 31.5–35.7)
MCV: 103 fL — ABNORMAL HIGH (ref 79–97)
Platelets: 180 10*3/uL (ref 150–450)
RBC: 3.22 x10E6/uL — ABNORMAL LOW (ref 3.77–5.28)
RDW: 17.5 % — ABNORMAL HIGH (ref 12.3–15.4)
WBC: 2.1 10*3/uL — CL (ref 3.4–10.8)

## 2018-03-23 LAB — PRO B NATRIURETIC PEPTIDE: NT-Pro BNP: 553 pg/mL (ref 0–738)

## 2018-03-28 ENCOUNTER — Telehealth: Payer: Self-pay | Admitting: Primary Care

## 2018-03-28 NOTE — Telephone Encounter (Signed)
Called to schedule f/u palliative care visit. Message left.

## 2018-03-30 ENCOUNTER — Encounter: Payer: Self-pay | Admitting: Adult Health

## 2018-03-30 ENCOUNTER — Ambulatory Visit: Payer: Medicare Other | Admitting: Adult Health

## 2018-03-30 VITALS — BP 110/63 | HR 84 | Ht 66.0 in | Wt 162.8 lb

## 2018-03-30 DIAGNOSIS — E7800 Pure hypercholesterolemia, unspecified: Secondary | ICD-10-CM | POA: Diagnosis not present

## 2018-03-30 DIAGNOSIS — I4811 Longstanding persistent atrial fibrillation: Secondary | ICD-10-CM

## 2018-03-30 NOTE — Progress Notes (Signed)
I have no problem considering DCCV, but I am not sure it will help how she feels. MCr

## 2018-03-30 NOTE — Patient Instructions (Signed)
Follow-Up: You will need a follow up appointment in 6 MONTHS.  Please call our office 2 months in advance(FEB 2020) to schedule the (April 2020) appointment.  You may see  DR Sallyanne Kuster, -or- one of the following Advanced Practice Providers on your designated Care Team:   o Almyra Deforest, PA-C  Fabian Sharp, [place]  Medication Instructions:  NO CHANGES- Your physician recommends that you continue on your current medications as directed. Please refer to the Current Medication list given to you today.  If you need a refill on your cardiac medications before your next appointment, please call your pharmacy.  Labwork: If you have labs (blood work) drawn today and your tests are completely normal, you will receive your results ONLY by: . MyChart Message (if you have MyChart) -OR- . A paper copy in the mail  At [facility], you and your health needs are our priority.  As part of our continuing mission to provide you with exceptional heart care, we have created designated Provider Care Teams.  These Care Teams include your primary Cardiologist (physician) and Advanced Practice Providers (APPs -  Physician Assistants and Nurse Practitioners) who all work together to provide you with the care you need, when you need it.  Thank you for choosing [facility] at [facility]!!
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
## 2018-04-04 DIAGNOSIS — H02834 Dermatochalasis of left upper eyelid: Secondary | ICD-10-CM | POA: Diagnosis not present

## 2018-04-04 DIAGNOSIS — H02831 Dermatochalasis of right upper eyelid: Secondary | ICD-10-CM | POA: Diagnosis not present

## 2018-04-04 DIAGNOSIS — H01021 Squamous blepharitis right upper eyelid: Secondary | ICD-10-CM | POA: Diagnosis not present

## 2018-04-04 DIAGNOSIS — H01022 Squamous blepharitis right lower eyelid: Secondary | ICD-10-CM | POA: Diagnosis not present

## 2018-04-11 ENCOUNTER — Other Ambulatory Visit: Payer: Self-pay | Admitting: Family Medicine

## 2018-04-11 ENCOUNTER — Ambulatory Visit
Admission: RE | Admit: 2018-04-11 | Discharge: 2018-04-11 | Disposition: A | Discharge status: Home / Self Care | Payer: Medicare Other | Source: Ambulatory Visit | Attending: Family Medicine | Admitting: Family Medicine

## 2018-04-11 ENCOUNTER — Other Ambulatory Visit (HOSPITAL_COMMUNITY): Payer: Self-pay | Admitting: Family Medicine

## 2018-04-11 DIAGNOSIS — J9811 Atelectasis: Secondary | ICD-10-CM | POA: Diagnosis not present

## 2018-04-11 DIAGNOSIS — R0600 Dyspnea, unspecified: Secondary | ICD-10-CM | POA: Diagnosis not present

## 2018-04-11 DIAGNOSIS — N183 Chronic kidney disease, stage 3 (moderate): Secondary | ICD-10-CM | POA: Diagnosis not present

## 2018-04-11 DIAGNOSIS — C50919 Malignant neoplasm of unspecified site of unspecified female breast: Secondary | ICD-10-CM | POA: Diagnosis not present

## 2018-04-11 DIAGNOSIS — C7951 Secondary malignant neoplasm of bone: Secondary | ICD-10-CM | POA: Diagnosis not present

## 2018-04-11 DIAGNOSIS — J90 Pleural effusion, not elsewhere classified: Secondary | ICD-10-CM | POA: Diagnosis not present

## 2018-04-12 ENCOUNTER — Ambulatory Visit (INDEPENDENT_AMBULATORY_CARE_PROVIDER_SITE_OTHER): Payer: Medicare Other | Admitting: *Deleted

## 2018-04-12 ENCOUNTER — Telehealth: Payer: Self-pay

## 2018-04-12 DIAGNOSIS — I495 Sick sinus syndrome: Secondary | ICD-10-CM

## 2018-04-12 DIAGNOSIS — R001 Bradycardia, unspecified: Secondary | ICD-10-CM

## 2018-04-12 NOTE — Telephone Encounter (Signed)
Spoke with pt and reminded pt of remote transmission that is due today. Pt verbalized understanding.   

## 2018-04-13 ENCOUNTER — Inpatient Hospital Stay: Payer: Medicare Other | Attending: Hematology & Oncology | Admitting: Hematology & Oncology

## 2018-04-13 ENCOUNTER — Inpatient Hospital Stay: Payer: Medicare Other

## 2018-04-13 ENCOUNTER — Other Ambulatory Visit: Payer: Self-pay

## 2018-04-13 ENCOUNTER — Encounter: Payer: Self-pay | Admitting: Hematology & Oncology

## 2018-04-13 VITALS — BP 123/75 | HR 81 | Temp 98.1°F | Resp 18 | Wt 161.8 lb

## 2018-04-13 DIAGNOSIS — Z79811 Long term (current) use of aromatase inhibitors: Secondary | ICD-10-CM | POA: Insufficient documentation

## 2018-04-13 DIAGNOSIS — C50911 Malignant neoplasm of unspecified site of right female breast: Secondary | ICD-10-CM

## 2018-04-13 DIAGNOSIS — J90 Pleural effusion, not elsewhere classified: Secondary | ICD-10-CM | POA: Insufficient documentation

## 2018-04-13 DIAGNOSIS — Z950 Presence of cardiac pacemaker: Secondary | ICD-10-CM | POA: Diagnosis not present

## 2018-04-13 DIAGNOSIS — Z79899 Other long term (current) drug therapy: Secondary | ICD-10-CM | POA: Diagnosis not present

## 2018-04-13 DIAGNOSIS — Z7901 Long term (current) use of anticoagulants: Secondary | ICD-10-CM | POA: Insufficient documentation

## 2018-04-13 DIAGNOSIS — C50919 Malignant neoplasm of unspecified site of unspecified female breast: Secondary | ICD-10-CM | POA: Insufficient documentation

## 2018-04-13 DIAGNOSIS — C7951 Secondary malignant neoplasm of bone: Principal | ICD-10-CM

## 2018-04-13 LAB — CBC WITH DIFFERENTIAL (CANCER CENTER ONLY)
Abs Immature Granulocytes: 0.02 10*3/uL (ref 0.00–0.07)
Basophils Absolute: 0.1 10*3/uL (ref 0.0–0.1)
Basophils Relative: 3 %
Eosinophils Absolute: 0.1 10*3/uL (ref 0.0–0.5)
Eosinophils Relative: 4 %
HCT: 35.9 % — ABNORMAL LOW (ref 36.0–46.0)
Hemoglobin: 11.4 g/dL — ABNORMAL LOW (ref 12.0–15.0)
Immature Granulocytes: 1 %
Lymphocytes Relative: 24 %
Lymphs Abs: 0.6 10*3/uL — ABNORMAL LOW (ref 0.7–4.0)
MCH: 33.5 pg (ref 26.0–34.0)
MCHC: 31.8 g/dL (ref 30.0–36.0)
MCV: 105.6 fL — ABNORMAL HIGH (ref 80.0–100.0)
Monocytes Absolute: 0.5 10*3/uL (ref 0.1–1.0)
Monocytes Relative: 20 %
Neutro Abs: 1.3 10*3/uL — ABNORMAL LOW (ref 1.7–7.7)
Neutrophils Relative %: 48 %
Platelet Count: 160 10*3/uL (ref 150–400)
RBC: 3.4 MIL/uL — ABNORMAL LOW (ref 3.87–5.11)
RDW: 15.6 % — ABNORMAL HIGH (ref 11.5–15.5)
WBC Count: 2.6 10*3/uL — ABNORMAL LOW (ref 4.0–10.5)
nRBC: 0 % (ref 0.0–0.2)

## 2018-04-13 LAB — CMP (CANCER CENTER ONLY)
ALT: 26 U/L (ref 10–47)
AST: 32 U/L (ref 11–38)
Albumin: 3.4 g/dL — ABNORMAL LOW (ref 3.5–5.0)
Alkaline Phosphatase: 130 U/L — ABNORMAL HIGH (ref 26–84)
Anion gap: 5 (ref 5–15)
BUN: 14 mg/dL (ref 7–22)
CO2: 31 mmol/L (ref 18–33)
Calcium: 9.4 mg/dL (ref 8.0–10.3)
Chloride: 109 mmol/L — ABNORMAL HIGH (ref 98–108)
Creatinine: 0.7 mg/dL (ref 0.60–1.20)
Glucose, Bld: 119 mg/dL — ABNORMAL HIGH (ref 73–118)
Potassium: 4.3 mmol/L (ref 3.3–4.7)
Sodium: 145 mmol/L (ref 128–145)
Total Bilirubin: 2.3 mg/dL — ABNORMAL HIGH (ref 0.2–1.6)
Total Protein: 6.3 g/dL — ABNORMAL LOW (ref 6.4–8.1)

## 2018-04-13 LAB — SAVE SMEAR(SSMR), FOR PROVIDER SLIDE REVIEW

## 2018-04-13 NOTE — Progress Notes (Signed)
Remote pacemaker transmission.   

## 2018-04-13 NOTE — Progress Notes (Signed)
Hematology and Oncology Follow Up Visit  Amy White [id number] 1926/02/25 84 y.o. 04/13/2018   Principle Diagnosis:   Metastatic breast cancer-bone only metastasis  Current Therapy:    Aromasin 25 mg p.o. Daily  Ribociclib 200 mg po q day (21/14) -- changed on 03/27/2018  Xgeva 120 mg sq q 3 months - next dose 03/2018      Interim History:  Amy White is back for follow-up.  Unfortunately, it now looks like there is some issues with pleural effusion.  She was having some shortness of breath a couple days ago.  She saw her family doctor.  Dr. Brigitte Pulse went ahead and did a chest x-ray.  It showed that she had a good amount of pleural fluid on the left and a smaller amount on the right.  She goes for a thoracentesis tomorrow.  I told her that I was not sure if this was cancerous fluid or not.  Hopefully, it is more from cardiac failure.  She has a pacemaker in place.  Her CA 27.29 has been holding pretty steady.  Last time we checked the CA 27.29 it was 53.  She is doing better on the lower dose of ribociclib.  She has not had any rashes.  She has had no bleeding.  She has had no change in bowel or bladder habits.  Of note, she is on Coumadin.  I think cardiology is managing this.  She is not having any pain.  Her appetite has been okay.  I think that her weight has been holding pretty steady.  Overall, I say her performance status is [name]-2.    Medications:  Current Outpatient Medications:  .  acetaminophen (TYLENOL) 500 MG tablet, Take 500 mg by mouth every 6 (six) hours as needed for fever., Disp: , Rfl:  .  albuterol (PROVENTIL HFA;VENTOLIN HFA) 108 (90 BASE) MCG/ACT inhaler, Inhale 2 puffs into the lungs every 6 (six) hours as needed for wheezing or shortness of breath., Disp: , Rfl:  .  ALPRAZolam (XANAX) 0.5 MG tablet, Take 0.25 mg by mouth 2 (two) times daily as needed for anxiety. , Disp: , Rfl: 0 .  bisoprolol (ZEBETA) 5 MG tablet, Take 1.5 tablets (7.5 mg  total) by mouth daily., Disp: 135 tablet, Rfl: 3 .  exemestane (AROMASIN) 25 MG tablet, Take 1 tablet (25 mg total) by mouth daily after breakfast., Disp: 30 tablet, Rfl: 12 .  feeding supplement, ENSURE ENLIVE, (ENSURE ENLIVE) LIQD, Take 237 mLs by mouth 2 (two) times daily between meals., Disp: 237 mL, Rfl: 12 .  furosemide (LASIX) 40 MG tablet, Take 20 mg by mouth daily as needed. , Disp: , Rfl:  .  levothyroxine (SYNTHROID, LEVOTHROID) 100 MCG tablet, Take 100 mcg by mouth daily before breakfast. , Disp: , Rfl: 3 .  oxyCODONE-acetaminophen (PERCOCET/ROXICET) 5-325 MG tablet, Take 1 tablet by mouth every 4 (four) hours as needed for severe pain (For pain.). , Disp: , Rfl:  .  tetrahydrozoline-zinc (VISINE-AC) 0.05-0.25 % ophthalmic solution, Place 2 drops into both eyes 3 (three) times daily as needed (red eyes)., Disp: , Rfl:  .  UNABLE TO FIND, Hair prosthesis, Disp: 1 each, Rfl: 0 .  Vitamin D, Ergocalciferol, (DRISDOL) 50000 units CAPS capsule, TAKE 1 CAPSULE BY MOUTH 1 TIME A WEEK, Disp: 12 capsule, Rfl: 3 .  warfarin (COUMADIN) 5 MG tablet, Take 2.5-5 mg by mouth every evening. Take one tablet on Tuesday and Thursday and half a tablet the rest of the  week., Disp: , Rfl:  .  diphenoxylate-atropine (LOMOTIL) 2.5-0.025 MG tablet, Take one tablet after each loose stool. Maximum of 8 tablets per day. (Patient taking differently: Take 1 tablet by mouth 4 (four) times daily as needed for diarrhea or loose stools. Maximum of 8 tablets per day.), Disp: 30 tablet, Rfl: 1 .  polyethylene glycol (MIRALAX / GLYCOLAX) packet, Take 17 g by mouth daily as needed for mild constipation., Disp: 14 each, Rfl: 0  Allergies:  Allergies  Allergen Reactions  . Morphine And Related Rash  . Crestor [Rosuvastatin] Other (See Comments)    Reaction unknown  . Sulfa Antibiotics Other (See Comments)    Mouth breaks out in sores  . Ciprofloxacin Rash and Other (See Comments)    Rash in the mouth  . Penicillins  Hives, Rash and Other (See Comments)    Has patient had a PCN reaction causing immediate rash, facial/tongue/throat swelling, SOB or lightheadedness with hypotension: yes Has patient had a PCN reaction causing severe rash involving mucus membranes or skin necrosis: no Has patient had a PCN reaction that required hospitalization: no Has patient had a PCN reaction occurring within the last 10 years: yes If all of the above answers are "NO", then may proceed with Cephalosporin use.   . Vancomycin Rash and Other (See Comments)    "Red-man"    Past Medical History, Surgical history, Social history, and Family History were reviewed and updated.  Review of Systems: Review of Systems  Constitutional: Negative.   HENT:  Negative.   Eyes: Negative.   Respiratory: Negative.   Cardiovascular: Negative.   Gastrointestinal: Negative.   Endocrine: Negative.   Genitourinary: Negative.    Musculoskeletal: Positive for back pain and myalgias.  Skin: Negative.   Neurological: Negative.   Hematological: Negative.   Psychiatric/Behavioral: Negative.     Physical Exam:  weight is 161 lb 12.8 oz (73.4 kg). Her temperature is 98.1 F (36.7 C). Her blood pressure is 123/75 and her pulse is 81. Her respiration is 18 and oxygen saturation is 95%.   Wt Readings from Last 3 Encounters:  04/13/18 161 lb 12.8 oz (73.4 kg)  03/30/18 162 lb 12.8 oz (73.8 kg)  03/22/18 161 lb 3.2 oz (73.1 kg)    Physical Exam  Constitutional: She is oriented to person, place, and time.  HENT:  Head: Normocephalic and atraumatic.  Mouth/Throat: Oropharynx is clear and moist.  Eyes: Pupils are equal, round, and reactive to light. EOM are normal.  Neck: Normal range of motion.  Cardiovascular: Normal rate, regular rhythm and normal heart sounds.  Pulmonary/Chest: Effort normal and breath sounds normal.  Abdominal: Soft. Bowel sounds are normal.  Musculoskeletal: Normal range of motion. She exhibits no edema, tenderness  or deformity.  Lymphadenopathy:    She has no cervical adenopathy.  Neurological: She is alert and oriented to person, place, and time.  Skin: Skin is warm and dry. No rash noted. No erythema.  Psychiatric: She has a normal mood and affect. Her behavior is normal. Judgment and thought content normal.  Vitals reviewed.    Lab Results  Component Value Date   WBC 2.6 (L) 04/13/2018   HGB 11.4 (L) 04/13/2018   HCT 35.9 (L) 04/13/2018   MCV 105.6 (H) 04/13/2018   PLT 160 04/13/2018     Chemistry      Component Value Date/Time   NA 145 04/13/2018 1417   NA 142 03/22/2018 1638   NA 146 (H) 01/25/2017 1448   NA 142  07/31/2015 1203   K 4.3 04/13/2018 1417   K 4.0 01/25/2017 1448   K 5.1 07/31/2015 1203   CL 109 (H) 04/13/2018 1417   CL 108 01/25/2017 1448   CO2 31 04/13/2018 1417   CO2 29 01/25/2017 1448   CO2 23 07/31/2015 1203   BUN 14 04/13/2018 1417   BUN 13 03/22/2018 1638   BUN 18 01/25/2017 1448   BUN 23.3 07/31/2015 1203   CREATININE 0.70 04/13/2018 1417   CREATININE 1.2 01/25/2017 1448   CREATININE 1.1 07/31/2015 1203      Component Value Date/Time   CALCIUM 9.4 04/13/2018 1417   CALCIUM 9.3 01/25/2017 1448   CALCIUM 9.3 07/31/2015 1203   ALKPHOS 130 (H) 04/13/2018 1417   ALKPHOS 64 01/25/2017 1448   ALKPHOS 57 07/31/2015 1203   AST 32 04/13/2018 1417   AST 18 07/31/2015 1203   ALT 26 04/13/2018 1417   ALT 20 01/25/2017 1448   ALT 15 07/31/2015 1203   BILITOT 2.3 (H) 04/13/2018 1417   BILITOT 0.96 07/31/2015 1203         Impression and Plan: Ms. Salvador is a 84 year old white female.  She has metastatic breast cancer.  It seems as if her disease is still confined to the bones.  He will be critical to see what his pleural effusion shows.  Hopefully, it will not be malignant.  If there are cancer cells, then I would switch out her Aromasin and use Faslodex.  I think this would be a very good way of trying to treat her.  I really would hate to use  chemotherapy given her age and overall performance status.  I would like to see her back in 3 weeks.  By then, we will definitely know if there is malignancy in the pleural fluid.  She has not had an echocardiogram in 4 years.  May be, this is something that could be done.       Volanda Napoleon, MD 11/6/20193:30 PM

## 2018-04-14 ENCOUNTER — Ambulatory Visit (HOSPITAL_COMMUNITY)
Admission: RE | Admit: 2018-04-14 | Discharge: 2018-04-14 | Disposition: A | Discharge status: Home / Self Care | Payer: Medicare Other | Source: Ambulatory Visit | Attending: Family Medicine | Admitting: Family Medicine

## 2018-04-14 ENCOUNTER — Other Ambulatory Visit (HOSPITAL_COMMUNITY): Payer: Self-pay | Admitting: Family Medicine

## 2018-04-14 ENCOUNTER — Encounter (HOSPITAL_COMMUNITY): Payer: Self-pay | Admitting: Physician Assistant

## 2018-04-14 DIAGNOSIS — J90 Pleural effusion, not elsewhere classified: Secondary | ICD-10-CM

## 2018-04-14 DIAGNOSIS — R846 Abnormal cytological findings in specimens from respiratory organs and thorax: Secondary | ICD-10-CM | POA: Diagnosis not present

## 2018-04-14 HISTORY — PX: IR THORACENTESIS ASP PLEURAL SPACE W/IMG GUIDE: IMG5380

## 2018-04-14 LAB — CANCER ANTIGEN 27.29: CA 27.29: 52.5 U/mL — ABNORMAL HIGH (ref 0.0–38.6)

## 2018-04-14 MED ORDER — LIDOCAINE HCL (PF) 1 % IJ SOLN
INTRAMUSCULAR | Status: AC | PRN
  Administered 2018-04-14: 10 mL

## 2018-04-14 MED ORDER — LIDOCAINE HCL 1 % IJ SOLN
INTRAMUSCULAR | Status: AC
  Filled 2018-04-14: qty 20

## 2018-04-14 NOTE — Procedures (Signed)
PROCEDURE SUMMARY:  Successful US guided left thoracentesis. Yielded 1.4 L of clear yellow fluid. Patient tolerated procedure well. No immediate complications.  Specimen was sent for labs.  Post procedure chest X-ray reveals no pneumothorax  Emry Barbato S Akilah Cureton PA-C 04/14/2018 3:24 PM

## 2018-04-15 ENCOUNTER — Other Ambulatory Visit: Payer: Self-pay | Admitting: Cardiovascular Disease

## 2018-04-17 ENCOUNTER — Encounter: Payer: Self-pay | Admitting: Cardiology

## 2018-04-18 ENCOUNTER — Telehealth: Payer: Self-pay | Admitting: *Deleted

## 2018-04-18 NOTE — Telephone Encounter (Signed)
Message left from patient stating that Dr. Raul Del office would like for Dr. Marin Olp to give an order as to what to do regarding patient's "overactive thyroid labs" that were drawn at Dr. Raul Del office.  Call placed back to patient to notify her that Dr. Marin Olp would like for Dr. Brigitte Pulse to take care of patient's thyroid labs.  Pt appreciative of call back and will notify Dr. Raul Del office.

## 2018-04-20 ENCOUNTER — Ambulatory Visit (INDEPENDENT_AMBULATORY_CARE_PROVIDER_SITE_OTHER): Payer: Medicare Other | Admitting: Pharmacist Clinician (PhC)/ Clinical Pharmacy Specialist

## 2018-04-20 DIAGNOSIS — Z7901 Long term (current) use of anticoagulants: Secondary | ICD-10-CM | POA: Diagnosis not present

## 2018-04-20 DIAGNOSIS — I4891 Unspecified atrial fibrillation: Secondary | ICD-10-CM

## 2018-04-20 LAB — POCT INR: INR: 1.9 — AB (ref 2.0–3.0)

## 2018-04-26 DIAGNOSIS — R079 Chest pain, unspecified: Secondary | ICD-10-CM | POA: Diagnosis not present

## 2018-05-06 ENCOUNTER — Other Ambulatory Visit: Payer: Medicare Other

## 2018-05-06 ENCOUNTER — Ambulatory Visit: Payer: Medicare Other | Admitting: Hematology & Oncology

## 2018-05-20 ENCOUNTER — Other Ambulatory Visit: Payer: Self-pay | Admitting: *Deleted

## 2018-05-20 DIAGNOSIS — C50911 Malignant neoplasm of unspecified site of right female breast: Secondary | ICD-10-CM

## 2018-05-20 DIAGNOSIS — C7951 Secondary malignant neoplasm of bone: Principal | ICD-10-CM

## 2018-05-23 ENCOUNTER — Ambulatory Visit: Payer: Medicare Other | Admitting: Hematology & Oncology

## 2018-05-23 ENCOUNTER — Telehealth: Payer: Self-pay | Admitting: Hematology & Oncology

## 2018-05-23 ENCOUNTER — Other Ambulatory Visit: Payer: Medicare Other

## 2018-05-23 NOTE — Telephone Encounter (Signed)
R/s pt 12/16 appts to 06/15/18 at 130 pm per pt request

## 2018-05-30 ENCOUNTER — Encounter (INDEPENDENT_AMBULATORY_CARE_PROVIDER_SITE_OTHER): Payer: Self-pay

## 2018-05-30 ENCOUNTER — Ambulatory Visit (INDEPENDENT_AMBULATORY_CARE_PROVIDER_SITE_OTHER): Payer: Medicare Other | Admitting: Pharmacist Clinician (PhC)/ Clinical Pharmacy Specialist

## 2018-05-30 DIAGNOSIS — Z7901 Long term (current) use of anticoagulants: Secondary | ICD-10-CM

## 2018-05-30 DIAGNOSIS — I4891 Unspecified atrial fibrillation: Secondary | ICD-10-CM | POA: Diagnosis not present

## 2018-05-30 LAB — POCT INR: INR: 2.5 (ref 2.0–3.0)

## 2018-06-10 ENCOUNTER — Telehealth: Payer: Self-pay | Admitting: Primary Care

## 2018-06-10 NOTE — Telephone Encounter (Signed)
Another attempt to reach patient to schedule palliative NP home visit. No answer.

## 2018-06-12 LAB — CUP PACEART REMOTE DEVICE CHECK
Battery Remaining Longevity: 70 mo
Battery Voltage: 3 V
Brady Statistic AP VP Percent: 0.82 %
Brady Statistic AP VS Percent: 0.19 %
Brady Statistic AS VP Percent: 11.32 %
Brady Statistic AS VS Percent: 87.67 %
Brady Statistic RA Percent Paced: 0.72 %
Brady Statistic RV Percent Paced: 10.86 %
Date Time Interrogation Session: 20191105184613
Implantable Lead Implant Date: 20160223
Implantable Lead Implant Date: 20160223
Implantable Lead Location: 753859
Implantable Lead Location: 753860
Implantable Lead Model: 5076
Implantable Lead Model: 5076
Implantable Pulse Generator Implant Date: 20160223
Lead Channel Impedance Value: 285 Ohm
Lead Channel Impedance Value: 380 Ohm
Lead Channel Impedance Value: 418 Ohm
Lead Channel Impedance Value: 513 Ohm
Lead Channel Pacing Threshold Amplitude: 0.75 V
Lead Channel Pacing Threshold Amplitude: 0.75 V
Lead Channel Pacing Threshold Pulse Width: 0.4 ms
Lead Channel Pacing Threshold Pulse Width: 0.4 ms
Lead Channel Sensing Intrinsic Amplitude: 0.5 mV
Lead Channel Sensing Intrinsic Amplitude: 0.5 mV
Lead Channel Sensing Intrinsic Amplitude: 12.625 mV
Lead Channel Sensing Intrinsic Amplitude: 12.625 mV
Lead Channel Setting Pacing Amplitude: 1.5 V
Lead Channel Setting Pacing Amplitude: 2 V
Lead Channel Setting Pacing Pulse Width: 0.4 ms
Lead Channel Setting Sensing Sensitivity: 0.9 mV

## 2018-06-15 ENCOUNTER — Inpatient Hospital Stay: Payer: Medicare Other | Attending: Hematology & Oncology

## 2018-06-15 ENCOUNTER — Inpatient Hospital Stay (HOSPITAL_BASED_OUTPATIENT_CLINIC_OR_DEPARTMENT_OTHER): Payer: Medicare Other | Admitting: Hematology & Oncology

## 2018-06-15 ENCOUNTER — Other Ambulatory Visit: Payer: Self-pay

## 2018-06-15 ENCOUNTER — Encounter: Payer: Self-pay | Admitting: Hematology & Oncology

## 2018-06-15 ENCOUNTER — Telehealth: Payer: Self-pay | Admitting: Hematology & Oncology

## 2018-06-15 VITALS — BP 137/68 | HR 70 | Temp 98.5°F | Resp 18 | Wt 153.5 lb

## 2018-06-15 DIAGNOSIS — Z950 Presence of cardiac pacemaker: Secondary | ICD-10-CM | POA: Diagnosis not present

## 2018-06-15 DIAGNOSIS — Z170 Estrogen receptor positive status [ER+]: Secondary | ICD-10-CM

## 2018-06-15 DIAGNOSIS — I4891 Unspecified atrial fibrillation: Secondary | ICD-10-CM | POA: Diagnosis not present

## 2018-06-15 DIAGNOSIS — Z7901 Long term (current) use of anticoagulants: Secondary | ICD-10-CM | POA: Insufficient documentation

## 2018-06-15 DIAGNOSIS — C7951 Secondary malignant neoplasm of bone: Secondary | ICD-10-CM | POA: Diagnosis not present

## 2018-06-15 DIAGNOSIS — Z79818 Long term (current) use of other agents affecting estrogen receptors and estrogen levels: Secondary | ICD-10-CM

## 2018-06-15 DIAGNOSIS — C50911 Malignant neoplasm of unspecified site of right female breast: Secondary | ICD-10-CM

## 2018-06-15 DIAGNOSIS — C50919 Malignant neoplasm of unspecified site of unspecified female breast: Secondary | ICD-10-CM | POA: Diagnosis not present

## 2018-06-15 DIAGNOSIS — M549 Dorsalgia, unspecified: Secondary | ICD-10-CM | POA: Insufficient documentation

## 2018-06-15 DIAGNOSIS — Z79899 Other long term (current) drug therapy: Secondary | ICD-10-CM

## 2018-06-15 LAB — CMP (CANCER CENTER ONLY)
ALT: 18 U/L (ref 0–44)
AST: 25 U/L (ref 15–41)
Albumin: 3.6 g/dL (ref 3.5–5.0)
Alkaline Phosphatase: 108 U/L (ref 38–126)
Anion gap: 5 (ref 5–15)
BUN: 13 mg/dL (ref 8–23)
CO2: 29 mmol/L (ref 22–32)
Calcium: 9 mg/dL (ref 8.9–10.3)
Chloride: 106 mmol/L (ref 98–111)
Creatinine: 0.87 mg/dL (ref 0.44–1.00)
GFR, Est AFR Am: >60 mL/min (ref >60)
GFR, Estimated: 59 mL/min — ABNORMAL LOW (ref >60)
Glucose, Bld: 96 mg/dL (ref 70–99)
Potassium: 4.6 mmol/L (ref 3.5–5.1)
Sodium: 140 mmol/L (ref 135–145)
Total Bilirubin: 3 mg/dL — ABNORMAL HIGH (ref 0.3–1.2)
Total Protein: 6.5 g/dL (ref 6.5–8.1)

## 2018-06-15 LAB — CBC WITH DIFFERENTIAL (CANCER CENTER ONLY)
Abs Immature Granulocytes: 0.02 10*3/uL (ref 0.00–0.07)
Basophils Absolute: 0 10*3/uL (ref 0.0–0.1)
Basophils Relative: 1 %
Eosinophils Absolute: 0.2 10*3/uL (ref 0.0–0.5)
Eosinophils Relative: 4 %
HCT: 37.7 % (ref 36.0–46.0)
Hemoglobin: 12.2 g/dL (ref 12.0–15.0)
Immature Granulocytes: 1 %
Lymphocytes Relative: 28 %
Lymphs Abs: 1 10*3/uL (ref 0.7–4.0)
MCH: 31 pg (ref 26.0–34.0)
MCHC: 32.4 g/dL (ref 30.0–36.0)
MCV: 95.7 fL (ref 80.0–100.0)
Monocytes Absolute: 0.4 10*3/uL (ref 0.1–1.0)
Monocytes Relative: 12 %
Neutro Abs: 2 10*3/uL (ref 1.7–7.7)
Neutrophils Relative %: 54 %
Platelet Count: 127 10*3/uL — ABNORMAL LOW (ref 150–400)
RBC: 3.94 MIL/uL (ref 3.87–5.11)
RDW: 13.3 % (ref 11.5–15.5)
WBC Count: 3.6 10*3/uL — ABNORMAL LOW (ref 4.0–10.5)
nRBC: 0 % (ref 0.0–0.2)

## 2018-06-15 NOTE — Telephone Encounter (Signed)
Appointments scheduled avs/calendar printed per 1/8 los

## 2018-06-15 NOTE — Progress Notes (Signed)
Hematology and Oncology Follow Up Visit  Amy White [id number] May 23, 1926 85 y.o. 06/15/2018   Principle Diagnosis:   Metastatic breast cancer-bone only metastasis  Current Therapy:    Aromasin 25 mg p.o. Daily -- d/c on 06/15/2018  Ribociclib 200 mg po q day (21/14) -- changed on 03/27/2018 -- patient stopped on her own in 04/2018  Faslodex 500 mg IM q month - start 06/22/2018  Xgeva 120 mg sq q 3 months - next dose 03/2018      Interim History:  Amy White is back for follow-up.  I am not sure exactly what is going on with her.  She for some reason, stopped taking the ribociclib.  She stopped taking the Aromasin.  She said that the Aromasin was costing her $148 a month and she cannot afford this.  I have no idea why she stopped taking the ribociclib.  I told her that if we are going to continue to treat her with anti-estrogen therapy, then I would have to use Faslodex which is an IM therapy for monthly treatment.  I think this would be reasonable.  Her last CA 27.29 was stable.  I still think that she is going to have more problems with her heart then with her breast cancer.  She has bad atrial fibrillation.  She is on Coumadin.  She is not complain of any pain.  She is not complaining of any change in bowel or bladder habits.  She has had no cough.  She did have a issues with a pleural effusion last time we saw her.  Thankfully, the pleural effusion did not show any malignant cells.  The pathology report number is [id number].I suspect then that the pleural effusion was secondary to cardiac issues.  She did have a nice Thanksgiving and Christmas.  Unfortunately, her companion passed away 3 weeks ago.  This is been a little bit tough on her.  Currently, her performance status is ECOG 2.      Medications:  Current Outpatient Medications:  .  acetaminophen (TYLENOL) 500 MG tablet, Take 500 mg by mouth every 6 (six) hours as needed for fever., Disp: , Rfl:  .   albuterol (PROVENTIL HFA;VENTOLIN HFA) 108 (90 BASE) MCG/ACT inhaler, Inhale 2 puffs into the lungs every 6 (six) hours as needed for wheezing or shortness of breath., Disp: , Rfl:  .  ALPRAZolam (XANAX) 0.5 MG tablet, Take 0.25 mg by mouth 2 (two) times daily as needed for anxiety. , Disp: , Rfl: 0 .  bisoprolol (ZEBETA) 5 MG tablet, TAKE 2 TABLETS BY MOUTH ONCE DAILY, Disp: 60 tablet, Rfl: 5 .  diphenoxylate-atropine (LOMOTIL) 2.5-0.025 MG tablet, Take one tablet after each loose stool. Maximum of 8 tablets per day. (Patient taking differently: Take 1 tablet by mouth 4 (four) times daily as needed for diarrhea or loose stools. Maximum of 8 tablets per day.), Disp: 30 tablet, Rfl: 1 .  exemestane (AROMASIN) 25 MG tablet, Take 1 tablet (25 mg total) by mouth daily after breakfast., Disp: 30 tablet, Rfl: 12 .  feeding supplement, ENSURE ENLIVE, (ENSURE ENLIVE) LIQD, Take 237 mLs by mouth 2 (two) times daily between meals., Disp: 237 mL, Rfl: 12 .  furosemide (LASIX) 40 MG tablet, Take 20 mg by mouth daily as needed. , Disp: , Rfl:  .  levothyroxine (SYNTHROID, LEVOTHROID) 100 MCG tablet, Take 100 mcg by mouth daily before breakfast. , Disp: , Rfl: 3 .  oxyCODONE-acetaminophen (PERCOCET/ROXICET) 5-325 MG tablet, Take 1 tablet by mouth  every 4 (four) hours as needed for severe pain (For pain.). , Disp: , Rfl:  .  polyethylene glycol (MIRALAX / GLYCOLAX) packet, Take 17 g by mouth daily as needed for mild constipation., Disp: 14 each, Rfl: 0 .  tetrahydrozoline-zinc (VISINE-AC) 0.05-0.25 % ophthalmic solution, Place 2 drops into both eyes 3 (three) times daily as needed (red eyes)., Disp: , Rfl:  .  UNABLE TO FIND, Hair prosthesis, Disp: 1 each, Rfl: 0 .  Vitamin D, Ergocalciferol, (DRISDOL) 50000 units CAPS capsule, TAKE 1 CAPSULE BY MOUTH 1 TIME A WEEK, Disp: 12 capsule, Rfl: 3 .  warfarin (COUMADIN) 5 MG tablet, Take 2.5-5 mg by mouth every evening. Take one tablet on Tuesday and Thursday and half a  tablet the rest of the week., Disp: , Rfl:   Allergies:  Allergies  Allergen Reactions  . Morphine And Related Rash  . Crestor [Rosuvastatin] Other (See Comments)    Reaction unknown  . Sulfa Antibiotics Other (See Comments)    Mouth breaks out in sores  . Ciprofloxacin Rash and Other (See Comments)    Rash in the mouth  . Penicillins Hives, Rash and Other (See Comments)    Has patient had a PCN reaction causing immediate rash, facial/tongue/throat swelling, SOB or lightheadedness with hypotension: yes Has patient had a PCN reaction causing severe rash involving mucus membranes or skin necrosis: no Has patient had a PCN reaction that required hospitalization: no Has patient had a PCN reaction occurring within the last 10 years: yes If all of the above answers are "NO", then may proceed with Cephalosporin use.   . Vancomycin Rash and Other (See Comments)    "Red-man"    Past Medical History, Surgical history, Social history, and Family History were reviewed and updated.  Review of Systems: Review of Systems  Constitutional: Negative.   HENT:  Negative.   Eyes: Negative.   Respiratory: Negative.   Cardiovascular: Negative.   Gastrointestinal: Negative.   Endocrine: Negative.   Genitourinary: Negative.    Musculoskeletal: Positive for back pain and myalgias.  Skin: Negative.   Neurological: Negative.   Hematological: Negative.   Psychiatric/Behavioral: Negative.     Physical Exam:  weight is 153 lb 8 oz (69.6 kg). Her oral temperature is 98.5 F (36.9 C). Her blood pressure is 137/68 and her pulse is 70. Her respiration is 18 and oxygen saturation is 98%.   Wt Readings from Last 3 Encounters:  06/15/18 153 lb 8 oz (69.6 kg)  04/13/18 161 lb 12.8 oz (73.4 kg)  03/30/18 162 lb 12.8 oz (73.8 kg)    Physical Exam Vitals signs reviewed.  HENT:     Head: Normocephalic and atraumatic.  Eyes:     Pupils: Pupils are equal, round, and reactive to light.  Neck:      Musculoskeletal: Normal range of motion.  Cardiovascular:     Rate and Rhythm: Normal rate and regular rhythm.     Heart sounds: Normal heart sounds.     Comments: Cardiac exam is irregular rate and irregular rhythm consistent with atrial fibrillation.  The heart shows no obvious murmurs or rubs. Pulmonary:     Effort: Pulmonary effort is normal.     Breath sounds: Normal breath sounds.  Abdominal:     General: Bowel sounds are normal.     Palpations: Abdomen is soft.  Musculoskeletal: Normal range of motion.        General: No tenderness or deformity.  Lymphadenopathy:     Cervical: No  cervical adenopathy.  Skin:    General: Skin is warm and dry.     Findings: No erythema or rash.  Neurological:     Mental Status: She is alert and oriented to person, place, and time.  Psychiatric:        Behavior: Behavior normal.        Thought Content: Thought content normal.        Judgment: Judgment normal.      Lab Results  Component Value Date   WBC 3.6 (L) 06/15/2018   HGB 12.2 06/15/2018   HCT 37.7 06/15/2018   MCV 95.7 06/15/2018   PLT 127 (L) 06/15/2018     Chemistry      Component Value Date/Time   NA 145 04/13/2018 1417   NA 142 03/22/2018 1638   NA 146 (H) 01/25/2017 1448   NA 142 07/31/2015 1203   K 4.3 04/13/2018 1417   K 4.0 01/25/2017 1448   K 5.1 07/31/2015 1203   CL 109 (H) 04/13/2018 1417   CL 108 01/25/2017 1448   CO2 31 04/13/2018 1417   CO2 29 01/25/2017 1448   CO2 23 07/31/2015 1203   BUN 14 04/13/2018 1417   BUN 13 03/22/2018 1638   BUN 18 01/25/2017 1448   BUN 23.3 07/31/2015 1203   CREATININE 0.70 04/13/2018 1417   CREATININE 1.2 01/25/2017 1448   CREATININE 1.1 07/31/2015 1203      Component Value Date/Time   CALCIUM 9.4 04/13/2018 1417   CALCIUM 9.3 01/25/2017 1448   CALCIUM 9.3 07/31/2015 1203   ALKPHOS 130 (H) 04/13/2018 1417   ALKPHOS 64 01/25/2017 1448   ALKPHOS 57 07/31/2015 1203   AST 32 04/13/2018 1417   AST 18 07/31/2015 1203    ALT 26 04/13/2018 1417   ALT 20 01/25/2017 1448   ALT 15 07/31/2015 1203   BILITOT 2.3 (H) 04/13/2018 1417   BILITOT 0.96 07/31/2015 1203         Impression and Plan: Amy White is a 85 year old white female.  She has metastatic breast cancer.  It seems as if her disease is still confined to the bones.  Again, I am still not sure why she stopped the ribociclib.  I can understand why she stopped the Aromasin because I think it is ridiculous that she would have to pay $150 a month for this.  I talked her about Faslodex.  I explained how Faslodex works.  I told her that this would be the most reasonable treatment for her.  She understands this.  She agrees to try it.  We will get her in to see [place] in another couple weeks.  I spent about 40 minutes with Amy White today.  It was difficult talking to her to try to figure out why she had stopped the ribociclib and when she has stopped the ribociclib.  I had to explain Faslodex and how it works.  I reviewed her lab work with her.  I answered her questions.  I help coordinate her future appointments and set up the Faslodex protocol.      Volanda Napoleon, MD 1/8/20202:06 PM

## 2018-06-16 ENCOUNTER — Telehealth: Payer: Self-pay | Admitting: Pharmacist

## 2018-06-16 NOTE — Telephone Encounter (Signed)
Oral Chemotherapy Pharmacist Encounter   Patient called me to let me know she was no longer taking her ribociclib, the pharmacy [facility] had called her for a refill. I reviewed the OV note from Dr. Marin Olp and it appears that Ms. Tiedt is suppose to still be taking her ribociclib.   I discussed this with Ms. Boot and she was adamant that she is not to take the ribociclib and will not take it as it "sent her to the hospital and made her sick". Ms. Janeway most recnt hospitalization was in 10/2017 while she was on abemaciclib, she was subsequently switched to ribociclib. I informed Ms. Flaim of this and she again repeated that she was told to stop and did not want to resume the ribociclib.   She also said that she was going to take an injection now instead of the ribociclib. I informed Ms. Rufer that the injection fulvestrant was going to be given to her instead of her exemestane tablet and was not replacing her ribociclib.  Ms. Brower again was adamant she did not want to take and would not take the ribociclib because it "made her sick". I told Ms. Tammi Klippel I would inform the office of her decision and I would inactivate her with [facility] so they would not continue to call her for refills.  Darl Pikes, PharmD, BCPS, [facility] Hematology/Oncology Clinical Pharmacist [facility]/AP [facility] [phone]  06/16/2018 12:38 PM

## 2018-06-27 ENCOUNTER — Other Ambulatory Visit: Payer: Self-pay | Admitting: Cardiovascular Disease

## 2018-06-29 ENCOUNTER — Inpatient Hospital Stay: Payer: Medicare Other | Admitting: Hematology & Oncology

## 2018-06-29 ENCOUNTER — Inpatient Hospital Stay: Payer: Medicare Other

## 2018-07-07 DIAGNOSIS — E039 Hypothyroidism, unspecified: Secondary | ICD-10-CM | POA: Diagnosis not present

## 2018-07-11 ENCOUNTER — Ambulatory Visit (INDEPENDENT_AMBULATORY_CARE_PROVIDER_SITE_OTHER): Payer: Medicare Other | Admitting: *Deleted

## 2018-07-11 DIAGNOSIS — Z7901 Long term (current) use of anticoagulants: Secondary | ICD-10-CM | POA: Diagnosis not present

## 2018-07-11 DIAGNOSIS — I4891 Unspecified atrial fibrillation: Secondary | ICD-10-CM | POA: Diagnosis not present

## 2018-07-11 LAB — POCT INR: INR: 1.8 — AB (ref 2.0–3.0)

## 2018-07-11 NOTE — Patient Instructions (Signed)
Description   Change your dose to 1/2 tablet daily except 1 tablet on Mondays, Wednesdays and Fridays.  Repeat INR in 2 weeks

## 2018-07-12 ENCOUNTER — Ambulatory Visit (INDEPENDENT_AMBULATORY_CARE_PROVIDER_SITE_OTHER): Payer: Medicare Other

## 2018-07-12 DIAGNOSIS — I495 Sick sinus syndrome: Secondary | ICD-10-CM

## 2018-07-12 DIAGNOSIS — R001 Bradycardia, unspecified: Secondary | ICD-10-CM

## 2018-07-12 LAB — CUP PACEART INCLINIC DEVICE CHECK
Date Time Interrogation Session: 20200204140229
Implantable Lead Implant Date: 20160223
Implantable Lead Implant Date: 20160223
Implantable Lead Location: 753859
Implantable Lead Location: 753860
Implantable Lead Model: 5076
Implantable Lead Model: 5076
Implantable Pulse Generator Implant Date: 20160223

## 2018-07-14 ENCOUNTER — Ambulatory Visit
Admission: RE | Admit: 2018-07-14 | Discharge: 2018-07-14 | Disposition: A | Discharge status: Home / Self Care | Payer: Medicare Other | Source: Ambulatory Visit | Attending: Family Medicine | Admitting: Family Medicine

## 2018-07-14 ENCOUNTER — Other Ambulatory Visit: Payer: Self-pay | Admitting: Family Medicine

## 2018-07-14 ENCOUNTER — Telehealth: Payer: Self-pay | Admitting: Cardiovascular Disease

## 2018-07-14 DIAGNOSIS — C50919 Malignant neoplasm of unspecified site of unspecified female breast: Secondary | ICD-10-CM | POA: Diagnosis not present

## 2018-07-14 DIAGNOSIS — J90 Pleural effusion, not elsewhere classified: Secondary | ICD-10-CM

## 2018-07-14 DIAGNOSIS — R0600 Dyspnea, unspecified: Secondary | ICD-10-CM | POA: Diagnosis not present

## 2018-07-14 DIAGNOSIS — C7951 Secondary malignant neoplasm of bone: Secondary | ICD-10-CM | POA: Diagnosis not present

## 2018-07-14 DIAGNOSIS — R0602 Shortness of breath: Secondary | ICD-10-CM

## 2018-07-14 LAB — CUP PACEART REMOTE DEVICE CHECK
Battery Remaining Longevity: 63 mo
Battery Voltage: 3 V
Brady Statistic AP VP Percent: 0.54 %
Brady Statistic AP VS Percent: 0.11 %
Brady Statistic AS VP Percent: 17.11 %
Brady Statistic AS VS Percent: 82.24 %
Brady Statistic RA Percent Paced: 0.5 %
Brady Statistic RV Percent Paced: 17.04 %
Date Time Interrogation Session: 20200204173934
Implantable Lead Implant Date: 20160223
Implantable Lead Implant Date: 20160223
Implantable Lead Location: 753859
Implantable Lead Location: 753860
Implantable Lead Model: 5076
Implantable Lead Model: 5076
Implantable Pulse Generator Implant Date: 20160223
Lead Channel Impedance Value: 304 Ohm
Lead Channel Impedance Value: 418 Ohm
Lead Channel Impedance Value: 418 Ohm
Lead Channel Impedance Value: 551 Ohm
Lead Channel Pacing Threshold Amplitude: 0.75 V
Lead Channel Pacing Threshold Amplitude: 0.75 V
Lead Channel Pacing Threshold Pulse Width: 0.4 ms
Lead Channel Pacing Threshold Pulse Width: 0.4 ms
Lead Channel Sensing Intrinsic Amplitude: 0.625 mV
Lead Channel Sensing Intrinsic Amplitude: 0.625 mV
Lead Channel Sensing Intrinsic Amplitude: 10.625 mV
Lead Channel Sensing Intrinsic Amplitude: 10.625 mV
Lead Channel Setting Pacing Amplitude: 1.5 V
Lead Channel Setting Pacing Amplitude: 2 V
Lead Channel Setting Pacing Pulse Width: 0.4 ms
Lead Channel Setting Sensing Sensitivity: 0.9 mV

## 2018-07-14 NOTE — Telephone Encounter (Signed)
Attempted to call pt voice mail box full unable to leave message.Will try later .Adonis Housekeeper

## 2018-07-14 NOTE — Telephone Encounter (Signed)
New message    Pt c/o Shortness Of Breath: STAT if SOB developed within the last 24 hours or pt is noticeably SOB on the phone  1. Are you currently SOB (can you hear that pt is SOB on the phone)? No   2. How long have you been experiencing SOB? Unknown   3. Are you SOB when sitting or when up moving around? At night   4. Are you currently experiencing any other symptoms? Unknown . Pt has other concerns

## 2018-07-15 MED ORDER — POTASSIUM CHLORIDE CRYS ER 20 MEQ PO TBCR: 20.0000 meq | EXTENDED_RELEASE_TABLET | Freq: Every day | ORAL | 3 refills | Status: DC
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
## 2018-07-15 NOTE — Telephone Encounter (Signed)
Pt aware of recommendations and will try Kcl supplement and will have labs done on 07/27/18 while here in [place] ./cy

## 2018-07-15 NOTE — Telephone Encounter (Signed)
I have reviewed the chest x-ray.  Also know that she had a thoracentesis in November. I am impressed by how asymmetrical the findings are on the chest x-ray.  There is virtually no fluid on the right side and the lungs do not look like she has heart failure. We can definitely try to see if increase diuretics lead to improvement, but I am concerned that another process might be responsible for this, not congestive heart failure. The thoracentesis in November only showed reactive mesothelial cells, but she does have a known diagnosis of metastatic breast cancer and it might be worthwhile reevaluating for neoplasm (thoracentesis may not answer the question and she may need a video-assisted biopsy of the pleura).  Please ask her to increase the furosemide to 40 mg once daily and add potassium chloride 20 mEq once daily.   Bring back for a BMP and BNP in 7-10 days. Please ask her to record her weight on a daily basis after increasing the dose of diuretic. I will send a copy of this note to Dr. Marin Olp as well.  Thanks [employer]
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
## 2018-07-19 ENCOUNTER — Encounter: Payer: Self-pay | Admitting: Adult Health

## 2018-07-19 ENCOUNTER — Ambulatory Visit: Payer: Medicare Other | Admitting: Adult Health

## 2018-07-19 ENCOUNTER — Ambulatory Visit (INDEPENDENT_AMBULATORY_CARE_PROVIDER_SITE_OTHER): Payer: Medicare Other | Admitting: Pharmacist

## 2018-07-19 VITALS — BP 128/70 | HR 83 | Ht 66.0 in | Wt 155.2 lb

## 2018-07-19 DIAGNOSIS — Z7901 Long term (current) use of anticoagulants: Secondary | ICD-10-CM

## 2018-07-19 DIAGNOSIS — I4819 Other persistent atrial fibrillation: Secondary | ICD-10-CM

## 2018-07-19 DIAGNOSIS — I4891 Unspecified atrial fibrillation: Secondary | ICD-10-CM | POA: Diagnosis not present

## 2018-07-19 LAB — POCT INR: INR: 2 (ref 2.0–3.0)

## 2018-07-19 MED ORDER — POTASSIUM CHLORIDE CRYS ER 10 MEQ PO TBCR: 20.0000 meq | EXTENDED_RELEASE_TABLET | Freq: Every day | ORAL | 6 refills | Status: AC

## 2018-07-19 NOTE — Progress Notes (Signed)
Thank you. Unfortunately, my best guess is that the effusion is neoplastic, not heart failure and that diuretics will not help. MCr

## 2018-07-19 NOTE — Patient Instructions (Signed)
Follow-Up: You will need a follow up appointment in 3-4 months.  Please call our office 2 months in advance to schedule this appointment.  You may see Sanda Klein, MD or one of the following Advanced Practice Providers on your designated Care Team:  Almyra Deforest, PA-C Fabian Sharp, [place]    Medication Instructions:  NO CHANGES- Your physician recommends that you continue on your current medications as directed. Please refer to the Current Medication list given to you today. If you need a refill on your cardiac medications before your next appointment, please call your pharmacy. Labwork: When you have labs (blood work) and your tests are completely normal, you will receive your results ONLY by [place] (if you have MyChart) -OR- A paper copy in the mail.  At [facility], you and your health needs are our priority.  As part of our continuing mission to provide you with exceptional heart care, we have created designated Provider Care Teams.  These Care Teams include your primary Cardiologist (physician) and Advanced Practice Providers (APPs -  Physician Assistants and Nurse Practitioners) who all work together to provide you with the care you need, when you need it.  Thank you for choosing [facility] at [facility]!!

## 2018-07-21 NOTE — Progress Notes (Signed)
Remote pacemaker transmission.   

## 2018-07-25 ENCOUNTER — Telehealth: Payer: Self-pay | Admitting: Hematology & Oncology

## 2018-07-25 ENCOUNTER — Inpatient Hospital Stay: Payer: Medicare Other

## 2018-07-25 ENCOUNTER — Other Ambulatory Visit: Payer: Self-pay

## 2018-07-25 ENCOUNTER — Inpatient Hospital Stay (HOSPITAL_BASED_OUTPATIENT_CLINIC_OR_DEPARTMENT_OTHER): Payer: Medicare Other | Admitting: Hematology & Oncology

## 2018-07-25 ENCOUNTER — Encounter: Payer: Self-pay | Admitting: Hematology & Oncology

## 2018-07-25 ENCOUNTER — Inpatient Hospital Stay: Payer: Medicare Other | Attending: Hematology & Oncology

## 2018-07-25 VITALS — BP 130/58 | HR 91 | Temp 98.2°F | Resp 19 | Wt 153.0 lb

## 2018-07-25 DIAGNOSIS — C7951 Secondary malignant neoplasm of bone: Secondary | ICD-10-CM | POA: Diagnosis not present

## 2018-07-25 DIAGNOSIS — Z7901 Long term (current) use of anticoagulants: Secondary | ICD-10-CM

## 2018-07-25 DIAGNOSIS — C50912 Malignant neoplasm of unspecified site of left female breast: Secondary | ICD-10-CM

## 2018-07-25 DIAGNOSIS — Z79811 Long term (current) use of aromatase inhibitors: Secondary | ICD-10-CM | POA: Insufficient documentation

## 2018-07-25 DIAGNOSIS — C50919 Malignant neoplasm of unspecified site of unspecified female breast: Secondary | ICD-10-CM | POA: Diagnosis not present

## 2018-07-25 DIAGNOSIS — Z79818 Long term (current) use of other agents affecting estrogen receptors and estrogen levels: Secondary | ICD-10-CM | POA: Insufficient documentation

## 2018-07-25 DIAGNOSIS — Z79899 Other long term (current) drug therapy: Secondary | ICD-10-CM | POA: Insufficient documentation

## 2018-07-25 DIAGNOSIS — C50911 Malignant neoplasm of unspecified site of right female breast: Secondary | ICD-10-CM | POA: Diagnosis not present

## 2018-07-25 DIAGNOSIS — Z950 Presence of cardiac pacemaker: Secondary | ICD-10-CM | POA: Insufficient documentation

## 2018-07-25 DIAGNOSIS — J90 Pleural effusion, not elsewhere classified: Secondary | ICD-10-CM | POA: Diagnosis not present

## 2018-07-25 LAB — CBC WITH DIFFERENTIAL (CANCER CENTER ONLY)
Abs Immature Granulocytes: 0.01 10*3/uL (ref 0.00–0.07)
Basophils Absolute: 0 10*3/uL (ref 0.0–0.1)
Basophils Relative: 1 %
Eosinophils Absolute: 0.2 10*3/uL (ref 0.0–0.5)
Eosinophils Relative: 5 %
HCT: 37.8 % (ref 36.0–46.0)
Hemoglobin: 12.2 g/dL (ref 12.0–15.0)
Immature Granulocytes: 0 %
Lymphocytes Relative: 29 %
Lymphs Abs: 1.2 10*3/uL (ref 0.7–4.0)
MCH: 29.8 pg (ref 26.0–34.0)
MCHC: 32.3 g/dL (ref 30.0–36.0)
MCV: 92.2 fL (ref 80.0–100.0)
Monocytes Absolute: 0.5 10*3/uL (ref 0.1–1.0)
Monocytes Relative: 13 %
Neutro Abs: 2.1 10*3/uL (ref 1.7–7.7)
Neutrophils Relative %: 52 %
Platelet Count: 143 10*3/uL — ABNORMAL LOW (ref 150–400)
RBC: 4.1 MIL/uL (ref 3.87–5.11)
RDW: 14.6 % (ref 11.5–15.5)
WBC Count: 4 10*3/uL (ref 4.0–10.5)
nRBC: 0 % (ref 0.0–0.2)

## 2018-07-25 LAB — CMP (CANCER CENTER ONLY)
ALT: 18 U/L (ref 0–44)
AST: 27 U/L (ref 15–41)
Albumin: 3.8 g/dL (ref 3.5–5.0)
Alkaline Phosphatase: 120 U/L (ref 38–126)
Anion gap: 7 (ref 5–15)
BUN: 18 mg/dL (ref 8–23)
CO2: 29 mmol/L (ref 22–32)
Calcium: 10 mg/dL (ref 8.9–10.3)
Chloride: 106 mmol/L (ref 98–111)
Creatinine: 0.94 mg/dL (ref 0.44–1.00)
GFR, Est AFR Am: >60 mL/min (ref >60)
GFR, Estimated: 53 mL/min — ABNORMAL LOW (ref >60)
Glucose, Bld: 99 mg/dL (ref 70–99)
Potassium: 4.3 mmol/L (ref 3.5–5.1)
Sodium: 142 mmol/L (ref 135–145)
Total Bilirubin: 2.3 mg/dL — ABNORMAL HIGH (ref 0.3–1.2)
Total Protein: 6.4 g/dL — ABNORMAL LOW (ref 6.5–8.1)

## 2018-07-25 MED ORDER — DENOSUMAB 120 MG/1.7ML ~~LOC~~ SOLN
120.0000 mg | Freq: Once | SUBCUTANEOUS | Status: AC
  Administered 2018-07-25: 120 mg via SUBCUTANEOUS

## 2018-07-25 MED ORDER — FULVESTRANT 250 MG/5ML IM SOLN
INTRAMUSCULAR | Status: AC
  Filled 2018-07-25: qty 10

## 2018-07-25 MED ORDER — ABEMACICLIB 100 MG PO TABS: 100.0000 mg | ORAL_TABLET | Freq: Two times a day (BID) | ORAL | 3 refills | Status: DC

## 2018-07-25 MED ORDER — FULVESTRANT 250 MG/5ML IM SOLN
500.0000 mg | INTRAMUSCULAR | Status: DC
  Administered 2018-07-25: 500 mg via INTRAMUSCULAR

## 2018-07-25 NOTE — Patient Instructions (Addendum)
Denosumab injection What is this medicine? DENOSUMAB (den oh sue mab) slows bone breakdown. Prolia is used to treat osteoporosis in women after menopause and in men, and in people who are taking corticosteroids for 6 months or more. Xgeva is used to treat a high calcium level due to cancer and to prevent bone fractures and other bone problems caused by multiple myeloma or cancer bone metastases. Xgeva is also used to treat giant cell tumor of the bone. This medicine may be used for other purposes; ask your health care provider or pharmacist if you have questions. COMMON BRAND NAME(S): Prolia, XGEVA What should I tell my health care provider before I take this medicine? They need to know if you have any of these conditions: -dental disease -having surgery or tooth extraction -infection -kidney disease -low levels of calcium or Vitamin D in the blood -malnutrition -on hemodialysis -skin conditions or sensitivity -thyroid or parathyroid disease -an unusual reaction to denosumab, other medicines, foods, dyes, or preservatives -pregnant or trying to get pregnant -breast-feeding How should I use this medicine? This medicine is for injection under the skin. It is given by a health care professional in a hospital or clinic setting. A special MedGuide will be given to you before each treatment. Be sure to read this information carefully each time. For Prolia, talk to your pediatrician regarding the use of this medicine in children. Special care may be needed. For Xgeva, talk to your pediatrician regarding the use of this medicine in children. While this drug may be prescribed for children as young as 13 years for selected conditions, precautions do apply. Overdosage: If you think you have taken too much of this medicine contact a poison control center or emergency room at once. NOTE: This medicine is only for you. Do not share this medicine with others. What if I miss a dose? It is important not to  miss your dose. Call your doctor or health care professional if you are unable to keep an appointment. What may interact with this medicine? Do not take this medicine with any of the following medications: -other medicines containing denosumab This medicine may also interact with the following medications: -medicines that lower your chance of fighting infection -steroid medicines like prednisone or cortisone This list may not describe all possible interactions. Give your health care provider a list of all the medicines, herbs, non-prescription drugs, or dietary supplements you use. Also tell them if you smoke, drink alcohol, or use illegal drugs. Some items may interact with your medicine. What should I watch for while using this medicine? Visit your doctor or health care professional for regular checks on your progress. Your doctor or health care professional may order blood tests and other tests to see how you are doing. Call your doctor or health care professional for advice if you get a fever, chills or sore throat, or other symptoms of a cold or flu. Do not treat yourself. This drug may decrease your body's ability to fight infection. Try to avoid being around people who are sick. You should make sure you get enough calcium and vitamin D while you are taking this medicine, unless your doctor tells you not to. Discuss the foods you eat and the vitamins you take with your health care professional. See your dentist regularly. Brush and floss your teeth as directed. Before you have any dental work done, tell your dentist you are receiving this medicine. Do not become pregnant while taking this medicine or for 5 months   after stopping it. Talk with your doctor or health care professional about your birth control options while taking this medicine. Women should inform their doctor if they wish to become pregnant or think they might be pregnant. There is a potential for serious side effects to an unborn  child. Talk to your health care professional or pharmacist for more information. What side effects may I notice from receiving this medicine? Side effects that you should report to your doctor or health care professional as soon as possible: -allergic reactions like skin rash, itching or hives, swelling of the face, lips, or tongue -bone pain -breathing problems -dizziness -jaw pain, especially after dental work -redness, blistering, peeling of the skin -signs and symptoms of infection like fever or chills; cough; sore throat; pain or trouble passing urine -signs of low calcium like fast heartbeat, muscle cramps or muscle pain; pain, tingling, numbness in the hands or feet; seizures -unusual bleeding or bruising -unusually weak or tired Side effects that usually do not require medical attention (report to your doctor or health care professional if they continue or are bothersome): -constipation -diarrhea -headache -joint pain -loss of appetite -muscle pain -runny nose -tiredness -upset stomach This list may not describe all possible side effects. Call your doctor for medical advice about side effects. You may report side effects to FDA at 1-800-FDA-1088. Where should I keep my medicine? This medicine is only given in a clinic, doctor's office, or other health care setting and will not be stored at home. NOTE: This sheet is a summary. It may not cover all possible information. If you have questions about this medicine, talk to your doctor, pharmacist, or health care provider.  2019 Elsevier/Gold Standard (2017-10-01 16:10:44) Fulvestrant injection What is this medicine? FULVESTRANT (ful VES trant) blocks the effects of estrogen. It is used to treat breast cancer. This medicine may be used for other purposes; ask your health care provider or pharmacist if you have questions. COMMON BRAND NAME(S): FASLODEX What should I tell my health care provider before I take this medicine? They  need to know if you have any of these conditions: -bleeding disorders -liver disease -low blood counts, like low white cell, platelet, or red cell counts -an unusual or allergic reaction to fulvestrant, other medicines, foods, dyes, or preservatives -pregnant or trying to get pregnant -breast-feeding How should I use this medicine? This medicine is for injection into a muscle. It is usually given by a health care professional in a hospital or clinic setting. Talk to your pediatrician regarding the use of this medicine in children. Special care may be needed. Overdosage: If you think you have taken too much of this medicine contact a poison control center or emergency room at once. NOTE: This medicine is only for you. Do not share this medicine with others. What if I miss a dose? It is important not to miss your dose. Call your doctor or health care professional if you are unable to keep an appointment. What may interact with this medicine? -medicines that treat or prevent blood clots like warfarin, enoxaparin, dalteparin, apixaban, dabigatran, and rivaroxaban This list may not describe all possible interactions. Give your health care provider a list of all the medicines, herbs, non-prescription drugs, or dietary supplements you use. Also tell them if you smoke, drink alcohol, or use illegal drugs. Some items may interact with your medicine. What should I watch for while using this medicine? Your condition will be monitored carefully while you are receiving this medicine. You   will need important blood work done while you are taking this medicine. Do not become pregnant while taking this medicine or for at least 1 year after stopping it. Women of child-bearing potential will need to have a negative pregnancy test before starting this medicine. Women should inform their doctor if they wish to become pregnant or think they might be pregnant. There is a potential for serious side effects to an unborn  child. Men should inform their doctors if they wish to father a child. This medicine may lower sperm counts. Talk to your health care professional or pharmacist for more information. Do not breast-feed an infant while taking this medicine or for 1 year after the last dose. What side effects may I notice from receiving this medicine? Side effects that you should report to your doctor or health care professional as soon as possible: -allergic reactions like skin rash, itching or hives, swelling of the face, lips, or tongue -feeling faint or lightheaded, falls -pain, tingling, numbness, or weakness in the legs -signs and symptoms of infection like fever or chills; cough; flu-like symptoms; sore throat -vaginal bleeding Side effects that usually do not require medical attention (report to your doctor or health care professional if they continue or are bothersome): -aches, pains -constipation -diarrhea -headache -hot flashes -nausea, vomiting -pain at site where injected -stomach pain This list may not describe all possible side effects. Call your doctor for medical advice about side effects. You may report side effects to FDA at 1-800-FDA-1088. Where should I keep my medicine? This drug is given in a hospital or clinic and will not be stored at home. NOTE: This sheet is a summary. It may not cover all possible information. If you have questions about this medicine, talk to your doctor, pharmacist, or health care provider.  2019 Elsevier/Gold Standard (2017-09-02 11:34:41)  

## 2018-07-25 NOTE — Progress Notes (Signed)
Hematology and Oncology Follow Up Visit  Amy White [id number] Jan 12, 1926 85 y.o. 07/25/2018   Principle Diagnosis:   Metastatic breast cancer-bone only metastasis  Current Therapy:    Aromasin 25 mg p.o. Daily -- d/c on 06/15/2018  Ribociclib 200 mg po q day (21/14) -- changed on 03/27/2018 -- patient stopped on her own in 04/2018  Faslodex 500 mg IM q month - start 07/25/2018  Verzenio 100 mg po BID -- start on 07/28/2018  Xgeva 120 mg sq q 3 months - next dose 03/2018      Interim History:  Amy White is back for follow-up.  Unfortunately, she somehow missed her appointment in January.  [place] in [place], [place].  Since we did not see here in January, she has had no treatment for her metastatic breast cancer.  She had a chest x-ray done last week or so.  This was actually done on February 6.  This showed a moderate left pleural effusion.  We will have to have this drained.  She has had a left pleural effusion before.  With this, she did not have any malignant cells.  Her cardiologist feel that the fluid is not from her heart but from her cancer.   She has not had a echocardiogram since October 2015.  I am sure that she must have had an echocardiogram since then.  She does have a pacemaker in place.  She is on Coumadin.  She is taking antiarrhythmics.  She does seem to be doing fairly well right now.  As always, she wants to know about my granddaughter.  I showed her pictures of my granddaughter.  That made her very happy.  Her appetite is doing okay.  I do not think she is losing weight.  She has had no fever.  There has been no nausea or vomiting.  She has had no increase in leg swelling.  Overall, her performance status is ECOG 2-3.     Medications:  Current Outpatient Medications:  .  abemaciclib (VERZENIO) 100 MG tablet, Take 1 tablet (100 mg total) by mouth 2 (two) times daily. Swallow tablets whole.  Do not chew, crush, or split tablets before swallowing., Disp: 60 tablet, Rfl: 3 .  acetaminophen (TYLENOL) 500 MG tablet, Take 500 mg by mouth every 6 (six) hours as needed for fever., Disp: , Rfl:  .  albuterol (PROVENTIL HFA;VENTOLIN HFA) 108 (90 BASE) MCG/ACT inhaler, Inhale 2 puffs into the lungs every 6 (six) hours as needed for wheezing or shortness of breath., Disp: , Rfl:  .  ALPRAZolam (XANAX) 0.5 MG tablet, Take 0.25 mg by mouth 2 (two) times daily as needed for anxiety. , Disp: , Rfl: 0 .  bisoprolol (ZEBETA) 5 MG tablet, TAKE 2 TABLETS BY MOUTH ONCE DAILY, Disp: 60 tablet, Rfl: 5 .  diphenoxylate-atropine (LOMOTIL) 2.5-0.025 MG tablet, Take one tablet after each loose stool. Maximum of 8 tablets per day. (Patient taking differently: Take 1 tablet by mouth 4 (four) times daily as needed for diarrhea or loose stools. Maximum of 8 tablets per day.), Disp: 30 tablet, Rfl: 1 .  feeding supplement, ENSURE ENLIVE, (ENSURE ENLIVE) LIQD, Take 237 mLs by mouth 2 (two) times daily between meals., Disp: 237 mL, Rfl: 12 .  furosemide (LASIX) 40 MG tablet, Take 20 mg by mouth daily as needed. , Disp: , Rfl:  .  levothyroxine (SYNTHROID, LEVOTHROID) 88 MCG tablet, Take 88 mcg by  mouth daily., Disp: , Rfl:  .  oxyCODONE-acetaminophen (PERCOCET/ROXICET) 5-325 MG tablet, Take 1 tablet by mouth every 4 (four) hours as needed for severe pain (For pain.). , Disp: , Rfl:  .  polyethylene glycol (MIRALAX / GLYCOLAX) packet, Take 17 g by mouth daily as needed for mild constipation., Disp: 14 each, Rfl: 0 .  potassium chloride SA (K-DUR,KLOR-CON) 10 MEQ tablet, Take 2 tablets (20 mEq total) by mouth daily., Disp: 60 tablet, Rfl: 6 .  tetrahydrozoline-zinc (VISINE-AC) 0.05-0.25 % ophthalmic solution, Place 2 drops into both eyes 3 (three) times daily as needed (red eyes)., Disp: , Rfl:  .  UNABLE TO FIND, Hair prosthesis, Disp: 1 each, Rfl: 0 .  Vitamin D, Ergocalciferol, (DRISDOL) 50000 units CAPS capsule, TAKE 1  CAPSULE BY MOUTH 1 TIME A WEEK, Disp: 12 capsule, Rfl: 3 .  warfarin (COUMADIN) 5 MG tablet, TAKE ONE-HALF TO 1 TABLET BY MOUTH DAILY AS DIRECTED BY COUMADIN CLINIC, Disp: 90 tablet, Rfl: 0 No current facility-administered medications for this visit.   Facility-Administered Medications Ordered in Other Visits:  .  fulvestrant (FASLODEX) injection 500 mg, 500 mg, Intramuscular, Q30 days, Volanda Napoleon, MD, 500 mg at 07/25/18 1555  Allergies:  Allergies  Allergen Reactions  . Morphine And Related Rash  . Crestor [Rosuvastatin] Other (See Comments)    Reaction unknown  . Sulfa Antibiotics Other (See Comments)    Mouth breaks out in sores  . Ciprofloxacin Rash and Other (See Comments)    Rash in the mouth  . Penicillins Hives, Rash and Other (See Comments)    Has patient had a PCN reaction causing immediate rash, facial/tongue/throat swelling, SOB or lightheadedness with hypotension: yes Has patient had a PCN reaction causing severe rash involving mucus membranes or skin necrosis: no Has patient had a PCN reaction that required hospitalization: no Has patient had a PCN reaction occurring within the last 10 years: yes If all of the above answers are "NO", then may proceed with Cephalosporin use.   . Vancomycin Rash and Other (See Comments)    "Red-man"    Past Medical History, Surgical history, Social history, and Family History were reviewed and updated.  Review of Systems: Review of Systems  Constitutional: Negative.   HENT:  Negative.   Eyes: Negative.   Respiratory: Negative.   Cardiovascular: Negative.   Gastrointestinal: Negative.   Endocrine: Negative.   Genitourinary: Negative.    Musculoskeletal: Positive for back pain and myalgias.  Skin: Negative.   Neurological: Negative.   Hematological: Negative.   Psychiatric/Behavioral: Negative.     Physical Exam:  weight is 153 lb (69.4 kg). Her oral temperature is 98.2 F (36.8 C). Her blood pressure is 130/58  (abnormal) and her pulse is 91. Her respiration is 19 and oxygen saturation is 100%.   Wt Readings from Last 3 Encounters:  07/25/18 153 lb (69.4 kg)  07/19/18 155 lb 4 oz (70.4 kg)  06/15/18 153 lb 8 oz (69.6 kg)    Physical Exam Vitals signs reviewed.  HENT:     Head: Normocephalic and atraumatic.  Eyes:     Pupils: Pupils are equal, round, and reactive to light.  Neck:     Musculoskeletal: Normal range of motion.  Cardiovascular:     Rate and Rhythm: Normal rate and regular rhythm.     Heart sounds: Normal heart sounds.     Comments: Cardiac exam is irregular rate and irregular rhythm consistent with atrial fibrillation.  The heart shows no obvious murmurs  or rubs. Pulmonary:     Effort: Pulmonary effort is normal.     Breath sounds: Normal breath sounds.  Abdominal:     General: Bowel sounds are normal.     Palpations: Abdomen is soft.  Musculoskeletal: Normal range of motion.        General: No tenderness or deformity.  Lymphadenopathy:     Cervical: No cervical adenopathy.  Skin:    General: Skin is warm and dry.     Findings: No erythema or rash.  Neurological:     Mental Status: She is alert and oriented to person, place, and time.  Psychiatric:        Behavior: Behavior normal.        Thought Content: Thought content normal.        Judgment: Judgment normal.      Lab Results  Component Value Date   WBC 4.0 07/25/2018   HGB 12.2 07/25/2018   HCT 37.8 07/25/2018   MCV 92.2 07/25/2018   PLT 143 (L) 07/25/2018     Chemistry      Component Value Date/Time   NA 142 07/25/2018 1357   NA 142 03/22/2018 1638   NA 146 (H) 01/25/2017 1448   NA 142 07/31/2015 1203   K 4.3 07/25/2018 1357   K 4.0 01/25/2017 1448   K 5.1 07/31/2015 1203   CL 106 07/25/2018 1357   CL 108 01/25/2017 1448   CO2 29 07/25/2018 1357   CO2 29 01/25/2017 1448   CO2 23 07/31/2015 1203   BUN 18 07/25/2018 1357   BUN 13 03/22/2018 1638   BUN 18 01/25/2017 1448   BUN 23.3  07/31/2015 1203   CREATININE 0.94 07/25/2018 1357   CREATININE 1.2 01/25/2017 1448   CREATININE 1.1 07/31/2015 1203      Component Value Date/Time   CALCIUM 10.0 07/25/2018 1357   CALCIUM 9.3 01/25/2017 1448   CALCIUM 9.3 07/31/2015 1203   ALKPHOS 120 07/25/2018 1357   ALKPHOS 64 01/25/2017 1448   ALKPHOS 57 07/31/2015 1203   AST 27 07/25/2018 1357   AST 18 07/31/2015 1203   ALT 18 07/25/2018 1357   ALT 20 01/25/2017 1448   ALT 15 07/31/2015 1203   BILITOT 2.3 (H) 07/25/2018 1357   BILITOT 0.96 07/31/2015 1203         Impression and Plan: Ms. Hayworth is a 85 year old white female.  She has metastatic breast cancer.  It seems as if her disease is still confined to the bones.  We will start her on Faslodex today.  Hopefully, she will be able to start the Verzenio in a week or so.  We will try to get the thoracentesis set up for this week.  She is on Coumadin.  If she needs to have the Coumadin stopped, we will need cardiology's help to know if she needs to be on any anticoagulant to "bridge" her until after the thoracentesis.  Hopefully, we will now be able to get her on a extended period of treatment to try to help with her breast cancer.  We have to keep in mind that she is 85 years old and that we have to make sure that her quality of life is preserved.  I would like to see her back in about 4 weeks or so.  She will come back in 2 weeks for her second Faslodex injection.     Volanda Napoleon, MD 2/17/20205:50 PM

## 2018-07-25 NOTE — Telephone Encounter (Signed)
Appointments scheduled avs declined/calendar printed per 2/17 los

## 2018-07-26 ENCOUNTER — Other Ambulatory Visit: Payer: Self-pay | Admitting: Family

## 2018-07-26 ENCOUNTER — Telehealth: Payer: Self-pay | Admitting: Hematology & Oncology

## 2018-07-26 DIAGNOSIS — C7951 Secondary malignant neoplasm of bone: Principal | ICD-10-CM

## 2018-07-26 DIAGNOSIS — C50912 Malignant neoplasm of unspecified site of left female breast: Secondary | ICD-10-CM

## 2018-07-26 DIAGNOSIS — J90 Pleural effusion, not elsewhere classified: Secondary | ICD-10-CM

## 2018-07-26 LAB — LACTATE DEHYDROGENASE: LDH: 211 U/L — ABNORMAL HIGH (ref 98–192)

## 2018-07-26 LAB — IRON AND TIBC
Iron: 78 ug/dL (ref 41–142)
Saturation Ratios: 22 % (ref 21–57)
TIBC: 359 ug/dL (ref 236–444)
UIBC: 280 ug/dL (ref 120–384)

## 2018-07-26 LAB — CANCER ANTIGEN 27.29: CA 27.29: 49.3 U/mL — ABNORMAL HIGH (ref 0.0–38.6)

## 2018-07-26 LAB — FERRITIN: Ferritin: 52 ng/mL (ref 11–307)

## 2018-07-26 NOTE — Telephone Encounter (Signed)
Appointment for IR scheduled and LMVM for patient with date/time/location & phone number for [facility] scheduling should sh have any questions per 2/17 los

## 2018-07-27 ENCOUNTER — Other Ambulatory Visit: Payer: Self-pay | Admitting: Hematology & Oncology

## 2018-07-27 ENCOUNTER — Telehealth: Payer: Self-pay | Admitting: Pharmacy Technician

## 2018-07-27 ENCOUNTER — Telehealth: Payer: Self-pay | Admitting: Pharmacist

## 2018-07-27 MED ORDER — PALBOCICLIB 75 MG PO CAPS: 75.0000 mg | ORAL_CAPSULE | Freq: Every day | ORAL | 5 refills | Status: DC

## 2018-07-27 NOTE — Telephone Encounter (Addendum)
Oral Oncology Patient Advocate Encounter  Prior Authorization for Leslee Home has been approved.    No PA number provided. Letter will be sent to office.  Effective dates: 07/27/2018 through 07/28/2019  Patients co-pay is $2797.12.  Will seek manufacturer assistance.  Oral Oncology Clinic will continue to follow.   [place] Patient [place] Phone [phone] Fax [phone] 07/27/2018 1:19 PM

## 2018-07-27 NOTE — Telephone Encounter (Signed)
Oral Oncology Patient Advocate Encounter  Received notification from [facility] that prior authorization for Leslee Home is required.  PA submitted on CoverMyMeds Key [id number] Status is pending  Oral Oncology Clinic will continue to follow.  [place] Patient [place] Phone [phone] Fax [phone] 07/27/2018 1:17 PM

## 2018-07-27 NOTE — Telephone Encounter (Signed)
Oral Oncology Pharmacist Encounter  Received new prescription for Ibrance (palbociclib) for the treatment of metastatic breast cancer, planned duration until disease progression or unacceptable drug toxicity.   CMP/CBC from 07/25/2018 assessed, no relevant lab abnormalities.    Current medication list in Epic reviewed, a few DDIs with palbociclib identified:  - Palbociclib may increase the serum concentration of alprazolam and oxycodone. Patient should be monitored for increased adverse events of alprazolam and oxycodone. No baseline dose adjustment needed.   Prescription has been e-scribed to the [facility] for benefits analysis and approval.  Oral Oncology Clinic will continue to follow for insurance authorization, copayment issues, initial counseling and start date.  Darl Pikes, PharmD, BCPS, [facility] Hematology/Oncology Clinical Pharmacist [facility] Oral [place] [facility] [phone]  07/27/2018 10:06 AM

## 2018-07-28 ENCOUNTER — Telehealth: Payer: Self-pay | Admitting: Student

## 2018-07-28 NOTE — Telephone Encounter (Signed)
PA notified patient with questions re: blood thinner with upcoming procedure.  She is scheduled for a thoracentesis.   Returned call but no answer and no voicemail. Will continue to attempt to reach patient.   Brynda Greathouse, MS RD PA-C 12:35 PM

## 2018-07-29 MED FILL — IBRANCE 75 MG CAPSULE: 75 | 21 days supply | Qty: 21 | Fill #0

## 2018-07-29 NOTE — Telephone Encounter (Signed)
Oral Chemotherapy Pharmacist Encounter  Patient Education I spoke with patient for overview of new oral chemotherapy medication: Ibrance (palbociclib) for the treatment of metastatic breast cancer, planned durationuntil disease progression or unacceptable drug toxicity.  Pt is doing well. Counseled patient on administration, dosing, side effects, monitoring, drug-food interactions, safe handling, storage, and disposal. Patient will take 1 capsule (75 mg total) by mouth daily with breakfast. Take whole with food. Take for 21 days on, 7 days off, repeat every 28 days.  Side effects include but not limited to: decreased wb/hgb/plt, fatigue, N/V, alopecia.    Reviewed with patient importance of keeping a medication schedule and plan for any missed doses.  Ms. Gravelle voiced understanding and appreciation. All questions answered. Medication handout placed in the mail. Ibrance calendar will be mailed along with her medication. Planned start date 08/01/2018.  Provided patient with Oral [facility] phone number. Patient knows to call the office with questions or concerns. Oral Chemotherapy Navigation Clinic will continue to follow.  Darl Pikes, PharmD, BCPS, [facility] Hematology/Oncology Clinical Pharmacist [facility] Oral [facility] [phone]  07/29/2018 2:32 PM

## 2018-08-01 NOTE — Telephone Encounter (Signed)
Oral Oncology Patient Advocate Encounter  Patient received 1 month free trial of Ibrance.  We will be applying for manufacturer assistance through [employer].  She stated that she will come by clinic to sign application and bring tax documents.    This encounter will be updated until final determination.   [place] Patient [place] Phone [phone] Fax [phone] 08/01/2018 2:41 PM

## 2018-08-03 ENCOUNTER — Other Ambulatory Visit: Payer: Self-pay | Admitting: Family

## 2018-08-03 ENCOUNTER — Encounter (HOSPITAL_COMMUNITY): Payer: Self-pay | Admitting: Radiology

## 2018-08-03 ENCOUNTER — Ambulatory Visit (HOSPITAL_COMMUNITY)
Admission: RE | Admit: 2018-08-03 | Discharge: 2018-08-03 | Disposition: A | Discharge status: Home / Self Care | Payer: Medicare Other | Source: Ambulatory Visit | Attending: Family | Admitting: Family

## 2018-08-03 DIAGNOSIS — J90 Pleural effusion, not elsewhere classified: Secondary | ICD-10-CM

## 2018-08-03 DIAGNOSIS — C50912 Malignant neoplasm of unspecified site of left female breast: Secondary | ICD-10-CM | POA: Insufficient documentation

## 2018-08-03 DIAGNOSIS — C7951 Secondary malignant neoplasm of bone: Secondary | ICD-10-CM | POA: Diagnosis not present

## 2018-08-03 DIAGNOSIS — R846 Abnormal cytological findings in specimens from respiratory organs and thorax: Secondary | ICD-10-CM | POA: Diagnosis not present

## 2018-08-03 HISTORY — PX: IR THORACENTESIS ASP PLEURAL SPACE W/IMG GUIDE: IMG5380

## 2018-08-03 MED ORDER — LIDOCAINE HCL 1 % IJ SOLN
INTRAMUSCULAR | Status: AC
  Filled 2018-08-03: qty 20

## 2018-08-03 MED ORDER — LIDOCAINE HCL 1 % IJ SOLN
INTRAMUSCULAR | Status: DC | PRN
  Administered 2018-08-03: 10 mL

## 2018-08-03 NOTE — Procedures (Signed)
PROCEDURE SUMMARY:  Successful US guided left thoracentesis. Yielded 900 mL of clear yellow fluid. Pt tolerated procedure well. No immediate complications.  Specimen was sent for labs. CXR ordered.  EBL < 5 mL  Ascencion Dike PA-C 08/03/2018 1:50 PM

## 2018-08-03 NOTE — Telephone Encounter (Signed)
Left message to see if/when patient will be by [facility] to bring tax information and sign manufacturer assistance application.

## 2018-08-04 NOTE — Telephone Encounter (Signed)
Patient will bring tax documents and sign application on Monday at appointment.

## 2018-08-08 ENCOUNTER — Inpatient Hospital Stay: Payer: Medicare Other | Attending: Hematology & Oncology

## 2018-08-08 VITALS — BP 134/76 | HR 76 | Temp 98.1°F | Resp 18

## 2018-08-08 DIAGNOSIS — C50911 Malignant neoplasm of unspecified site of right female breast: Secondary | ICD-10-CM | POA: Insufficient documentation

## 2018-08-08 DIAGNOSIS — Z79899 Other long term (current) drug therapy: Secondary | ICD-10-CM | POA: Diagnosis not present

## 2018-08-08 DIAGNOSIS — Z5111 Encounter for antineoplastic chemotherapy: Secondary | ICD-10-CM | POA: Insufficient documentation

## 2018-08-08 DIAGNOSIS — D708 Other neutropenia: Secondary | ICD-10-CM | POA: Diagnosis not present

## 2018-08-08 DIAGNOSIS — Z7901 Long term (current) use of anticoagulants: Secondary | ICD-10-CM | POA: Diagnosis not present

## 2018-08-08 DIAGNOSIS — Z950 Presence of cardiac pacemaker: Secondary | ICD-10-CM | POA: Insufficient documentation

## 2018-08-08 DIAGNOSIS — C7951 Secondary malignant neoplasm of bone: Secondary | ICD-10-CM | POA: Diagnosis not present

## 2018-08-08 DIAGNOSIS — C50919 Malignant neoplasm of unspecified site of unspecified female breast: Secondary | ICD-10-CM

## 2018-08-08 MED ORDER — FULVESTRANT 250 MG/5ML IM SOLN
500.0000 mg | INTRAMUSCULAR | Status: DC
  Administered 2018-08-08: 500 mg via INTRAMUSCULAR

## 2018-08-08 NOTE — Patient Instructions (Signed)

## 2018-08-09 ENCOUNTER — Telehealth: Payer: Self-pay | Admitting: *Deleted

## 2018-08-09 NOTE — Telephone Encounter (Signed)
Patient notified per order of Dr. Marin Olp that there is no cancer cells in the fluid that was taken out.  Patient appreciative of call and has no questions or concerns at this time.

## 2018-08-09 NOTE — Telephone Encounter (Signed)
Faxed application to [employer] ([phone]).

## 2018-08-09 NOTE — Telephone Encounter (Signed)
-----   Message from Volanda Napoleon, MD sent at 08/08/2018 10:32 AM EST ----- Call - NO cancer cells in the fluid that was taken out!!  Laurey Arrow

## 2018-08-11 NOTE — Telephone Encounter (Signed)
[employer] needs more current income information. Patient is going to try to bring information by [facility] since it is closer to her home.

## 2018-08-12 NOTE — Telephone Encounter (Signed)
Faxed income documentation to [place] this morning.

## 2018-08-15 ENCOUNTER — Ambulatory Visit (INDEPENDENT_AMBULATORY_CARE_PROVIDER_SITE_OTHER): Payer: Medicare Other | Admitting: *Deleted

## 2018-08-15 DIAGNOSIS — I4891 Unspecified atrial fibrillation: Secondary | ICD-10-CM

## 2018-08-15 DIAGNOSIS — Z7901 Long term (current) use of anticoagulants: Secondary | ICD-10-CM

## 2018-08-15 LAB — POCT INR: INR: 1.8 — AB (ref 2.0–3.0)

## 2018-08-15 NOTE — Patient Instructions (Addendum)
Description   Today take 1.5 tablets, then Continue 1/2 tablet daily except 1 tablet on Mondays, Wednesdays and Fridays.  Repeat INR in 2-3 weeks

## 2018-08-15 NOTE — Telephone Encounter (Signed)
Refaxed page 5 of application that [place] said was missing.  Also included patients phone number that was missing.

## 2018-08-16 NOTE — Telephone Encounter (Addendum)
Oral Oncology Patient Advocate Encounter  Received notification from [place] that patient has been successfully enrolled into their program to receive Ibrance from the manufacturer at $0 out of pocket until 06/08/2019.    Patient knows to call the office with questions or concerns.   Oral Oncology Clinic will continue to follow.  [place] Patient [place] Phone [phone] Fax [phone] 08/16/2018 3:06 PM

## 2018-08-18 NOTE — Telephone Encounter (Signed)
Mail box full, could not leave a message.  Called grand daughter, Marijean Heath, and left a voicemail informing her of approval.

## 2018-08-19 ENCOUNTER — Telehealth: Payer: Self-pay | Admitting: Hematology & Oncology

## 2018-08-19 NOTE — Telephone Encounter (Signed)
Faxed medical records to: [place]: [phone]      COPY SCANNED

## 2018-08-22 ENCOUNTER — Inpatient Hospital Stay: Payer: Medicare Other

## 2018-08-22 ENCOUNTER — Encounter: Payer: Self-pay | Admitting: Hematology & Oncology

## 2018-08-22 ENCOUNTER — Other Ambulatory Visit: Payer: Self-pay

## 2018-08-22 ENCOUNTER — Inpatient Hospital Stay (HOSPITAL_BASED_OUTPATIENT_CLINIC_OR_DEPARTMENT_OTHER): Payer: Medicare Other | Admitting: Hematology & Oncology

## 2018-08-22 VITALS — BP 120/55 | HR 83 | Temp 97.9°F | Resp 19 | Wt 155.0 lb

## 2018-08-22 DIAGNOSIS — Z5111 Encounter for antineoplastic chemotherapy: Secondary | ICD-10-CM | POA: Diagnosis not present

## 2018-08-22 DIAGNOSIS — C50911 Malignant neoplasm of unspecified site of right female breast: Secondary | ICD-10-CM

## 2018-08-22 DIAGNOSIS — C7951 Secondary malignant neoplasm of bone: Secondary | ICD-10-CM | POA: Diagnosis not present

## 2018-08-22 DIAGNOSIS — Z79899 Other long term (current) drug therapy: Secondary | ICD-10-CM | POA: Diagnosis not present

## 2018-08-22 DIAGNOSIS — Z950 Presence of cardiac pacemaker: Secondary | ICD-10-CM | POA: Diagnosis not present

## 2018-08-22 DIAGNOSIS — Z7901 Long term (current) use of anticoagulants: Secondary | ICD-10-CM | POA: Diagnosis not present

## 2018-08-22 DIAGNOSIS — C50919 Malignant neoplasm of unspecified site of unspecified female breast: Secondary | ICD-10-CM

## 2018-08-22 DIAGNOSIS — D708 Other neutropenia: Secondary | ICD-10-CM

## 2018-08-22 LAB — CBC WITH DIFFERENTIAL (CANCER CENTER ONLY)
Abs Immature Granulocytes: 0 10*3/uL (ref 0.00–0.07)
Basophils Absolute: 0 10*3/uL (ref 0.0–0.1)
Basophils Relative: 1 %
Eosinophils Absolute: 0 10*3/uL (ref 0.0–0.5)
Eosinophils Relative: 2 %
HCT: 33.6 % — ABNORMAL LOW (ref 36.0–46.0)
Hemoglobin: 11 g/dL — ABNORMAL LOW (ref 12.0–15.0)
Immature Granulocytes: 0 %
Lymphocytes Relative: 54 %
Lymphs Abs: 0.7 10*3/uL (ref 0.7–4.0)
MCH: 30.8 pg (ref 26.0–34.0)
MCHC: 32.7 g/dL (ref 30.0–36.0)
MCV: 94.1 fL (ref 80.0–100.0)
Monocytes Absolute: 0.1 10*3/uL (ref 0.1–1.0)
Monocytes Relative: 6 %
Neutro Abs: 0.5 10*3/uL — ABNORMAL LOW (ref 1.7–7.7)
Neutrophils Relative %: 37 %
Platelet Count: 52 10*3/uL — ABNORMAL LOW (ref 150–400)
RBC: 3.57 MIL/uL — ABNORMAL LOW (ref 3.87–5.11)
RDW: 15.4 % (ref 11.5–15.5)
WBC Count: 1.3 10*3/uL — ABNORMAL LOW (ref 4.0–10.5)
nRBC: 0 % (ref 0.0–0.2)

## 2018-08-22 LAB — CMP (CANCER CENTER ONLY)
ALT: 19 U/L (ref 0–44)
AST: 26 U/L (ref 15–41)
Albumin: 4.1 g/dL (ref 3.5–5.0)
Alkaline Phosphatase: 134 U/L — ABNORMAL HIGH (ref 38–126)
Anion gap: 6 (ref 5–15)
BUN: 18 mg/dL (ref 8–23)
CO2: 28 mmol/L (ref 22–32)
Calcium: 9.3 mg/dL (ref 8.9–10.3)
Chloride: 106 mmol/L (ref 98–111)
Creatinine: 0.91 mg/dL (ref 0.44–1.00)
GFR, Est AFR Am: >60 mL/min (ref >60)
GFR, Estimated: 56 mL/min — ABNORMAL LOW (ref >60)
Glucose, Bld: 109 mg/dL — ABNORMAL HIGH (ref 70–99)
Potassium: 4.5 mmol/L (ref 3.5–5.1)
Sodium: 140 mmol/L (ref 135–145)
Total Bilirubin: 2.7 mg/dL — ABNORMAL HIGH (ref 0.3–1.2)
Total Protein: 6.8 g/dL (ref 6.5–8.1)

## 2018-08-22 LAB — PROTIME-INR
INR: 1.8 — ABNORMAL HIGH (ref 0.8–1.2)
Prothrombin Time: 20.7 seconds — ABNORMAL HIGH (ref 11.4–15.2)

## 2018-08-22 MED ORDER — TBO-FILGRASTIM 480 MCG/0.8ML ~~LOC~~ SOSY
480.0000 ug | PREFILLED_SYRINGE | Freq: Once | SUBCUTANEOUS | Status: AC
  Administered 2018-08-22: 480 ug via SUBCUTANEOUS
  Filled 2018-08-22: qty 0.8

## 2018-08-22 MED ORDER — FULVESTRANT 250 MG/5ML IM SOLN
500.0000 mg | INTRAMUSCULAR | Status: DC
  Administered 2018-08-22: 500 mg via INTRAMUSCULAR

## 2018-08-22 MED ORDER — FILGRASTIM 480 MCG/0.8ML IJ SOSY: 480.0000 ug | PREFILLED_SYRINGE | Freq: Once | INTRAMUSCULAR | Status: DC

## 2018-08-22 NOTE — Patient Instructions (Addendum)
Fulvestrant injection What is this medicine? FULVESTRANT (ful VES trant) blocks the effects of estrogen. It is used to treat breast cancer. This medicine may be used for other purposes; ask your health care provider or pharmacist if you have questions. COMMON BRAND NAME(S): FASLODEX What should I tell my health care provider before I take this medicine? They need to know if you have any of these conditions: -bleeding disorders -liver disease -low blood counts, like low white cell, platelet, or red cell counts -an unusual or allergic reaction to fulvestrant, other medicines, foods, dyes, or preservatives -pregnant or trying to get pregnant -breast-feeding How should I use this medicine? This medicine is for injection into a muscle. It is usually given by a health care professional in a hospital or clinic setting. Talk to your pediatrician regarding the use of this medicine in children. Special care may be needed. Overdosage: If you think you have taken too much of this medicine contact a poison control center or emergency room at once. NOTE: This medicine is only for you. Do not share this medicine with others. What if I miss a dose? It is important not to miss your dose. Call your doctor or health care professional if you are unable to keep an appointment. What may interact with this medicine? -medicines that treat or prevent blood clots like warfarin, enoxaparin, dalteparin, apixaban, dabigatran, and rivaroxaban This list may not describe all possible interactions. Give your health care provider a list of all the medicines, herbs, non-prescription drugs, or dietary supplements you use. Also tell them if you smoke, drink alcohol, or use illegal drugs. Some items may interact with your medicine. What should I watch for while using this medicine? Your condition will be monitored carefully while you are receiving this medicine. You will need important blood work done while you are taking this  medicine. Do not become pregnant while taking this medicine or for at least 1 year after stopping it. Women of child-bearing potential will need to have a negative pregnancy test before starting this medicine. Women should inform their doctor if they wish to become pregnant or think they might be pregnant. There is a potential for serious side effects to an unborn child. Men should inform their doctors if they wish to father a child. This medicine may lower sperm counts. Talk to your health care professional or pharmacist for more information. Do not breast-feed an infant while taking this medicine or for 1 year after the last dose. What side effects may I notice from receiving this medicine? Side effects that you should report to your doctor or health care professional as soon as possible: -allergic reactions like skin rash, itching or hives, swelling of the face, lips, or tongue -feeling faint or lightheaded, falls -pain, tingling, numbness, or weakness in the legs -signs and symptoms of infection like fever or chills; cough; flu-like symptoms; sore throat -vaginal bleeding Side effects that usually do not require medical attention (report to your doctor or health care professional if they continue or are bothersome): -aches, pains -constipation -diarrhea -headache -hot flashes -nausea, vomiting -pain at site where injected -stomach pain This list may not describe all possible side effects. Call your doctor for medical advice about side effects. You may report side effects to FDA at 1-800-FDA-1088. Where should I keep my medicine? This drug is given in a hospital or clinic and will not be stored at home. NOTE: This sheet is a summary. It may not cover all possible information. If you   have questions about this medicine, talk to your doctor, pharmacist, or health care provider.  2019 Elsevier/Gold Standard (2017-09-02 11:34:41) Granisetron extended-release injection What is this  medicine? GRANISETRON (gra NI se tron) is an antiemetic. It is used to prevent nausea and vomiting caused by chemotherapy. This medicine may be used for other purposes; ask your health care provider or pharmacist if you have questions. COMMON BRAND NAME(S): Sustol What should I tell my health care provider before I take this medicine? They need to know if you have any of these conditions: -dry hard stool that will not pass out of your rectum -an unusual or allergic reaction to granisetron, ondansetron, other medicines, foods, dyes, or preservatives -pregnant or trying to get pregnant -breast-feeding How should I use this medicine? This medicine is for injection under the skin. It is given by a health care professional in a hospital or clinic setting. A special MedGuide will be given to you before each treatment. Be sure to read this information carefully each time. Overdosage: If you think you have taken too much of this medicine contact a poison control center or emergency room at once. NOTE: This medicine is only for you. Do not share this medicine with others. What if I miss a dose? It is important not to miss your dose. Call your doctor or health care professional if you are unable to keep an appointment. What may interact with this medicine? Do not take this medicine with any of the following medications: -certain medicines for fungal infections like fluconazole, itraconazole, ketoconazole, posaconazole, voriconazole -cisapride -dofetilide -dronedarone -pimozide -thioridazine -ziprasidone This medicine may also interact with the following medications: -certain medicines for depression, anxiety, or psychotic disturbances -fentanyl -linezolid -MAOIs like Carbex, Eldepryl, Marplan, Nardil, and Parnate -methylene blue (injected into a vein) -other medicines that prolong the QT interval (cause an abnormal heart rhythm) -phenobarbital -tramadol This list may not describe all  possible interactions. Give your health care provider a list of all the medicines, herbs, non-prescription drugs, or dietary supplements you use. Also tell them if you smoke, drink alcohol, or use illegal drugs. Some items may interact with your medicine. What should I watch for while using this medicine? This medicine will cause constipation. Try to have a bowel movement at least every 2 to 3 days. If you do not have a bowel movement for 3 days, call your doctor or health care professional. What side effects may I notice from receiving this medicine? Side effects that you should report to your doctor or health care professional as soon as possible: -allergic reactions like skin rash, itching or hives, swelling of the face, lips, or tongue -breathing problems -confusion -constipation -dizziness -fast, irregular heartbeat -feeling faint or lightheaded, falls -fever and chills -loss of balance or coordination -seizures -skin irritation or skin reaction -sweating -tightness in the chest -tremors -unusually weak or tired Side effects that usually do not require medical attention (report to your doctor or health care professional if they continue or are bothersome): -diarrhea -headache This list may not describe all possible side effects. Call your doctor for medical advice about side effects. You may report side effects to FDA at 1-800-FDA-1088. Where should I keep my medicine? This drug is given in a hospital or clinic and will not be stored at home. NOTE: This sheet is a summary. It may not cover all possible information. If you have questions about this medicine, talk to your doctor, pharmacist, or health care provider.  2019 Elsevier/Gold Standard (2017-10-01 15:33:05)

## 2018-08-22 NOTE — Progress Notes (Signed)
Per Dr Marin Olp, ok to treat with Faslodex with low platelets and WBC. Neupogen ordered. dph

## 2018-08-22 NOTE — Progress Notes (Signed)
Hematology and Oncology Follow Up Visit  Amy White [id number] April 29, 1926 85 y.o. 08/22/2018   Principle Diagnosis:   Metastatic breast cancer-bone only metastasis  Current Therapy:    Aromasin 25 mg p.o. Daily -- d/c on 06/15/2018  Ribociclib 200 mg po q day (21/14) -- changed on 03/27/2018 -- patient stopped on her own in 04/2018  Faslodex 500 mg IM q month - start 07/25/2018  Ibrance 75 mg q day  (14on/14off)-- start on 07/28/2018  Xgeva 120 mg sq q 3 months - on hold permanently due to bad teeth.      Interim History:  Ms. Amy White is back for follow-up.  She looks pretty good.  She feels pretty good.  Her 89st birthday is coming up in 2 days.  Again, the coronavirus is really causing problems for her.  Also causing problems I think is the [place].  Her white cell count is only 1.3.  Her platelet count is down to 52,000.  She is on the lowest dose of Ibrance.  We really cannot go any lower.  I will have her only take Ibrance for 14 days on and 14 days off.  She is on a break right now.  I told her to take an extra week off and restart the Ibrance on 09/04/2018.  I wrote this down for her.  She does not complain of any bony pain.  She has horrible arthritis.  She is eating okay.  She is having no nausea or vomiting.  There is no change in bowel or bladder habits.  She has had no cardiac issues.  Her last CA 27-29 is holding steady at 98.  She started the Faslodex last month.  She is doing okay with that.  We cannot give her any Delton See because she has terrible dentition.  She says teeth are falling out.     Medications:  Current Outpatient Medications:  .  acetaminophen (TYLENOL) 500 MG tablet, Take 500 mg by mouth every 6 (six) hours as needed for fever., Disp: , Rfl:  .  albuterol (PROVENTIL HFA;VENTOLIN HFA) 108 (90 BASE) MCG/ACT inhaler, Inhale 2 puffs into the lungs every 6 (six) hours as needed for wheezing or shortness of breath., Disp: , Rfl:  .  ALPRAZolam  (XANAX) 0.5 MG tablet, Take 0.25 mg by mouth 2 (two) times daily as needed for anxiety. , Disp: , Rfl: 0 .  bisoprolol (ZEBETA) 5 MG tablet, TAKE 2 TABLETS BY MOUTH ONCE DAILY, Disp: 60 tablet, Rfl: 5 .  diphenoxylate-atropine (LOMOTIL) 2.5-0.025 MG tablet, Take one tablet after each loose stool. Maximum of 8 tablets per day. (Patient taking differently: Take 1 tablet by mouth 4 (four) times daily as needed for diarrhea or loose stools. Maximum of 8 tablets per day.), Disp: 30 tablet, Rfl: 1 .  feeding supplement, ENSURE ENLIVE, (ENSURE ENLIVE) LIQD, Take 237 mLs by mouth 2 (two) times daily between meals., Disp: 237 mL, Rfl: 12 .  furosemide (LASIX) 40 MG tablet, Take 20 mg by mouth daily as needed. , Disp: , Rfl:  .  levothyroxine (SYNTHROID, LEVOTHROID) 88 MCG tablet, Take 88 mcg by mouth daily., Disp: , Rfl:  .  oxyCODONE-acetaminophen (PERCOCET/ROXICET) 5-325 MG tablet, Take 1 tablet by mouth every 4 (four) hours as needed for severe pain (For pain.). , Disp: , Rfl:  .  palbociclib (IBRANCE) 75 MG capsule, Take 1 capsule (75 mg total) by mouth daily with breakfast. Take whole with food. Take for 21 days on, 7 days off,  repeat every 28 days., Disp: 21 capsule, Rfl: 5 .  polyethylene glycol (MIRALAX / GLYCOLAX) packet, Take 17 g by mouth daily as needed for mild constipation., Disp: 14 each, Rfl: 0 .  potassium chloride SA (K-DUR,KLOR-CON) 10 MEQ tablet, Take 2 tablets (20 mEq total) by mouth daily., Disp: 60 tablet, Rfl: 6 .  tetrahydrozoline-zinc (VISINE-AC) 0.05-0.25 % ophthalmic solution, Place 2 drops into both eyes 3 (three) times daily as needed (red eyes)., Disp: , Rfl:  .  UNABLE TO FIND, Hair prosthesis, Disp: 1 each, Rfl: 0 .  Vitamin D, Ergocalciferol, (DRISDOL) 50000 units CAPS capsule, TAKE 1 CAPSULE BY MOUTH 1 TIME A WEEK, Disp: 12 capsule, Rfl: 3 .  warfarin (COUMADIN) 5 MG tablet, TAKE ONE-HALF TO 1 TABLET BY MOUTH DAILY AS DIRECTED BY COUMADIN CLINIC, Disp: 90 tablet, Rfl: 0 No  current facility-administered medications for this visit.   Facility-Administered Medications Ordered in Other Visits:  .  fulvestrant (FASLODEX) injection 500 mg, 500 mg, Intramuscular, Q30 days, Asif Muchow, Rudell Cobb, MD  Allergies:  Allergies  Allergen Reactions  . Morphine And Related Rash  . Crestor [Rosuvastatin] Other (See Comments)    Reaction unknown  . Sulfa Antibiotics Other (See Comments)    Mouth breaks out in sores  . Ciprofloxacin Rash and Other (See Comments)    Rash in the mouth  . Penicillins Hives, Rash and Other (See Comments)    Has patient had a PCN reaction causing immediate rash, facial/tongue/throat swelling, SOB or lightheadedness with hypotension: yes Has patient had a PCN reaction causing severe rash involving mucus membranes or skin necrosis: no Has patient had a PCN reaction that required hospitalization: no Has patient had a PCN reaction occurring within the last 10 years: yes If all of the above answers are "NO", then may proceed with Cephalosporin use.   . Vancomycin Rash and Other (See Comments)    "Red-man"    Past Medical History, Surgical history, Social history, and Family History were reviewed and updated.  Review of Systems: Review of Systems  Constitutional: Negative.   HENT:  Negative.   Eyes: Negative.   Respiratory: Negative.   Cardiovascular: Negative.   Gastrointestinal: Negative.   Endocrine: Negative.   Genitourinary: Negative.    Musculoskeletal: Positive for back pain and myalgias.  Skin: Negative.   Neurological: Negative.   Hematological: Negative.   Psychiatric/Behavioral: Negative.     Physical Exam:  weight is 155 lb (70.3 kg). Her oral temperature is 97.9 F (36.6 C). Her blood pressure is 120/55 (abnormal) and her pulse is 83. Her respiration is 19 and oxygen saturation is 95%.   Wt Readings from Last 3 Encounters:  08/22/18 155 lb (70.3 kg)  07/25/18 153 lb (69.4 kg)  07/19/18 155 lb 4 oz (70.4 kg)    Physical  Exam Vitals signs reviewed.  HENT:     Head: Normocephalic and atraumatic.  Eyes:     Pupils: Pupils are equal, round, and reactive to light.  Neck:     Musculoskeletal: Normal range of motion.  Cardiovascular:     Rate and Rhythm: Normal rate and regular rhythm.     Heart sounds: Normal heart sounds.     Comments: Cardiac exam is irregular rate and irregular rhythm consistent with atrial fibrillation.  The heart shows no obvious murmurs or rubs. Pulmonary:     Effort: Pulmonary effort is normal.     Breath sounds: Normal breath sounds.  Abdominal:     General: Bowel sounds are normal.  Palpations: Abdomen is soft.  Musculoskeletal: Normal range of motion.        General: No tenderness or deformity.  Lymphadenopathy:     Cervical: No cervical adenopathy.  Skin:    General: Skin is warm and dry.     Findings: No erythema or rash.  Neurological:     Mental Status: She is alert and oriented to person, place, and time.  Psychiatric:        Behavior: Behavior normal.        Thought Content: Thought content normal.        Judgment: Judgment normal.      Lab Results  Component Value Date   WBC 1.3 (L) 08/22/2018   HGB 11.0 (L) 08/22/2018   HCT 33.6 (L) 08/22/2018   MCV 94.1 08/22/2018   PLT 52 (L) 08/22/2018     Chemistry      Component Value Date/Time   NA 140 08/22/2018 1417   NA 142 03/22/2018 1638   NA 146 (H) 01/25/2017 1448   NA 142 07/31/2015 1203   K 4.5 08/22/2018 1417   K 4.0 01/25/2017 1448   K 5.1 07/31/2015 1203   CL 106 08/22/2018 1417   CL 108 01/25/2017 1448   CO2 28 08/22/2018 1417   CO2 29 01/25/2017 1448   CO2 23 07/31/2015 1203   BUN 18 08/22/2018 1417   BUN 13 03/22/2018 1638   BUN 18 01/25/2017 1448   BUN 23.3 07/31/2015 1203   CREATININE 0.91 08/22/2018 1417   CREATININE 1.2 01/25/2017 1448   CREATININE 1.1 07/31/2015 1203      Component Value Date/Time   CALCIUM 9.3 08/22/2018 1417   CALCIUM 9.3 01/25/2017 1448   CALCIUM 9.3  07/31/2015 1203   ALKPHOS 134 (H) 08/22/2018 1417   ALKPHOS 64 01/25/2017 1448   ALKPHOS 57 07/31/2015 1203   AST 26 08/22/2018 1417   AST 18 07/31/2015 1203   ALT 19 08/22/2018 1417   ALT 20 01/25/2017 1448   ALT 15 07/31/2015 1203   BILITOT 2.7 (H) 08/22/2018 1417   BILITOT 0.96 07/31/2015 1203         Impression and Plan: Ms. Vanduzer is a 85 year old white female.  She has metastatic breast cancer.  It seems as if her disease is still confined to the bones.  Hopefully, the change in the Ibrance dose will help her a little bit.  We had to give her a dose of Neupogen today.  I spent about 35 minutes with her today.  All the time spent counseling her.  I had to get her set up for the Neupogen injection.  This was a bit more complicated than I thought.  We will have her come back in 1 month.   Volanda Napoleon, MD 3/16/20203:37 PM

## 2018-08-22 NOTE — Telephone Encounter (Signed)
Patient called Amy White, pharmacist, and she informed her of approval with [employer] PAP.    Scheduled delivery for Wednesday 3/18.  [employer] will deliver, no signature is required.  Amy White will call Amy White on her off week to set up delivery of her refill.  Phone number to [place] was given to patient while on the phone. [phone]).  [place] [facility] [place] Phone [phone] Fax [phone] 08/22/2018 11:36 AM

## 2018-08-23 ENCOUNTER — Telehealth: Payer: Self-pay | Admitting: Hematology & Oncology

## 2018-08-23 LAB — CANCER ANTIGEN 27.29: CA 27.29: 92.9 U/mL — ABNORMAL HIGH (ref 0.0–38.6)

## 2018-08-23 NOTE — Telephone Encounter (Signed)
lmom to inform pt of 09/19/18 appt at 2 pm per 3/16 LOS

## 2018-08-29 ENCOUNTER — Telehealth: Payer: Self-pay

## 2018-08-29 NOTE — Telephone Encounter (Signed)
LMOM FOR PRESCREEN AND MADE AWARE OF DRIVE THRU AT [facility]

## 2018-08-29 NOTE — Telephone Encounter (Signed)

## 2018-08-31 ENCOUNTER — Ambulatory Visit
Admission: RE | Admit: 2018-08-31 | Discharge: 2018-08-31 | Disposition: A | Discharge status: Home / Self Care | Payer: Medicare Other | Source: Ambulatory Visit | Attending: Family Medicine | Admitting: Family Medicine

## 2018-08-31 ENCOUNTER — Other Ambulatory Visit: Payer: Self-pay | Admitting: Family Medicine

## 2018-08-31 ENCOUNTER — Ambulatory Visit: Payer: Medicare Other | Admitting: Pharmacist

## 2018-08-31 DIAGNOSIS — M25512 Pain in left shoulder: Secondary | ICD-10-CM

## 2018-09-05 ENCOUNTER — Telehealth: Payer: Self-pay | Admitting: Pharmacist Clinician (PhC)/ Clinical Pharmacy Specialist

## 2018-09-05 NOTE — Telephone Encounter (Signed)
New Message: ° ° ° ° °Please call. °

## 2018-09-05 NOTE — Telephone Encounter (Signed)
Completed. Please see coumadin clinic note.

## 2018-09-05 NOTE — Telephone Encounter (Signed)
Anybody know what happened to Ms. Civil's INR.  Encounter open, but says lab error.  Continue same dose and repeat in 1 week.  I'll call her, but need to know why the error.  Did she drive off before machine (didn't) result?

## 2018-09-14 ENCOUNTER — Telehealth: Payer: Self-pay

## 2018-09-14 NOTE — Telephone Encounter (Signed)
lmom for prescreen/drive thru 

## 2018-09-15 ENCOUNTER — Other Ambulatory Visit: Payer: Self-pay

## 2018-09-15 ENCOUNTER — Ambulatory Visit (INDEPENDENT_AMBULATORY_CARE_PROVIDER_SITE_OTHER): Payer: Medicare Other | Admitting: Pharmacist Clinician (PhC)/ Clinical Pharmacy Specialist

## 2018-09-15 ENCOUNTER — Telehealth: Payer: Self-pay | Admitting: *Deleted

## 2018-09-15 DIAGNOSIS — Z7901 Long term (current) use of anticoagulants: Secondary | ICD-10-CM

## 2018-09-15 DIAGNOSIS — I4891 Unspecified atrial fibrillation: Secondary | ICD-10-CM

## 2018-09-15 LAB — POCT INR: INR: 1.9 — AB (ref 2.0–3.0)

## 2018-09-15 NOTE — Telephone Encounter (Signed)

## 2018-09-19 ENCOUNTER — Ambulatory Visit: Payer: Medicare Other | Admitting: Hematology & Oncology

## 2018-09-19 ENCOUNTER — Encounter: Payer: Self-pay | Admitting: Hematology & Oncology

## 2018-09-19 ENCOUNTER — Other Ambulatory Visit: Payer: Medicare Other

## 2018-09-19 ENCOUNTER — Other Ambulatory Visit: Payer: Self-pay

## 2018-09-19 ENCOUNTER — Inpatient Hospital Stay: Payer: Medicare Other

## 2018-09-19 ENCOUNTER — Inpatient Hospital Stay (HOSPITAL_BASED_OUTPATIENT_CLINIC_OR_DEPARTMENT_OTHER): Payer: Medicare Other | Admitting: Hematology & Oncology

## 2018-09-19 ENCOUNTER — Ambulatory Visit: Payer: Medicare Other

## 2018-09-19 ENCOUNTER — Inpatient Hospital Stay: Payer: Medicare Other | Attending: Hematology & Oncology

## 2018-09-19 VITALS — BP 120/64 | HR 82 | Temp 98.2°F | Resp 19 | Wt 154.0 lb

## 2018-09-19 DIAGNOSIS — M549 Dorsalgia, unspecified: Secondary | ICD-10-CM | POA: Insufficient documentation

## 2018-09-19 DIAGNOSIS — C7951 Secondary malignant neoplasm of bone: Secondary | ICD-10-CM | POA: Insufficient documentation

## 2018-09-19 DIAGNOSIS — Z170 Estrogen receptor positive status [ER+]: Secondary | ICD-10-CM | POA: Diagnosis not present

## 2018-09-19 DIAGNOSIS — I4891 Unspecified atrial fibrillation: Secondary | ICD-10-CM | POA: Insufficient documentation

## 2018-09-19 DIAGNOSIS — Z7901 Long term (current) use of anticoagulants: Secondary | ICD-10-CM

## 2018-09-19 DIAGNOSIS — C50919 Malignant neoplasm of unspecified site of unspecified female breast: Secondary | ICD-10-CM

## 2018-09-19 DIAGNOSIS — Z79899 Other long term (current) drug therapy: Secondary | ICD-10-CM

## 2018-09-19 DIAGNOSIS — C50911 Malignant neoplasm of unspecified site of right female breast: Secondary | ICD-10-CM | POA: Insufficient documentation

## 2018-09-19 DIAGNOSIS — Z79811 Long term (current) use of aromatase inhibitors: Secondary | ICD-10-CM

## 2018-09-19 LAB — CBC WITH DIFFERENTIAL (CANCER CENTER ONLY)
Abs Immature Granulocytes: 0 10*3/uL (ref 0.00–0.07)
Basophils Absolute: 0 10*3/uL (ref 0.0–0.1)
Basophils Relative: 2 %
Eosinophils Absolute: 0.1 10*3/uL (ref 0.0–0.5)
Eosinophils Relative: 3 %
HCT: 31.4 % — ABNORMAL LOW (ref 36.0–46.0)
Hemoglobin: 10.4 g/dL — ABNORMAL LOW (ref 12.0–15.0)
Immature Granulocytes: 0 %
Lymphocytes Relative: 46 %
Lymphs Abs: 0.9 10*3/uL (ref 0.7–4.0)
MCH: 32.4 pg (ref 26.0–34.0)
MCHC: 33.1 g/dL (ref 30.0–36.0)
MCV: 97.8 fL (ref 80.0–100.0)
Monocytes Absolute: 0.3 10*3/uL (ref 0.1–1.0)
Monocytes Relative: 14 %
Neutro Abs: 0.7 10*3/uL — ABNORMAL LOW (ref 1.7–7.7)
Neutrophils Relative %: 35 %
Platelet Count: 76 10*3/uL — ABNORMAL LOW (ref 150–400)
RBC: 3.21 MIL/uL — ABNORMAL LOW (ref 3.87–5.11)
RDW: 19.8 % — ABNORMAL HIGH (ref 11.5–15.5)
WBC Count: 2 10*3/uL — ABNORMAL LOW (ref 4.0–10.5)
nRBC: 0 % (ref 0.0–0.2)

## 2018-09-19 LAB — CMP (CANCER CENTER ONLY)
ALT: 20 U/L (ref 0–44)
AST: 26 U/L (ref 15–41)
Albumin: 3.9 g/dL (ref 3.5–5.0)
Alkaline Phosphatase: 125 U/L (ref 38–126)
Anion gap: 6 (ref 5–15)
BUN: 21 mg/dL (ref 8–23)
CO2: 29 mmol/L (ref 22–32)
Calcium: 9.9 mg/dL (ref 8.9–10.3)
Chloride: 106 mmol/L (ref 98–111)
Creatinine: 0.95 mg/dL (ref 0.44–1.00)
GFR, Est AFR Am: >60 mL/min (ref >60)
GFR, Estimated: 52 mL/min — ABNORMAL LOW (ref >60)
Glucose, Bld: 110 mg/dL — ABNORMAL HIGH (ref 70–99)
Potassium: 4.7 mmol/L (ref 3.5–5.1)
Sodium: 141 mmol/L (ref 135–145)
Total Bilirubin: 1.3 mg/dL — ABNORMAL HIGH (ref 0.3–1.2)
Total Protein: 6.5 g/dL (ref 6.5–8.1)

## 2018-09-19 MED ORDER — HEPARIN SOD (PORK) LOCK FLUSH 100 UNIT/ML IV SOLN
250.0000 [IU] | Freq: Once | INTRAVENOUS | Status: DC | PRN
  Filled 2018-09-19: qty 5

## 2018-09-19 MED ORDER — SODIUM CHLORIDE 0.9% FLUSH
10.0000 mL | INTRAVENOUS | Status: DC | PRN
  Filled 2018-09-19: qty 10

## 2018-09-19 MED ORDER — HEPARIN SOD (PORK) LOCK FLUSH 100 UNIT/ML IV SOLN
500.0000 [IU] | Freq: Once | INTRAVENOUS | Status: DC | PRN
  Filled 2018-09-19: qty 5

## 2018-09-19 MED ORDER — SODIUM CHLORIDE 0.9% FLUSH
3.0000 mL | Freq: Once | INTRAVENOUS | Status: DC | PRN
  Filled 2018-09-19: qty 10

## 2018-09-19 MED ORDER — ALTEPLASE 2 MG IJ SOLR
2.0000 mg | Freq: Once | INTRAMUSCULAR | Status: DC | PRN
  Filled 2018-09-19: qty 2

## 2018-09-19 MED ORDER — FULVESTRANT 250 MG/5ML IM SOLN
500.0000 mg | INTRAMUSCULAR | Status: DC
  Administered 2018-09-19: 500 mg via INTRAMUSCULAR

## 2018-09-19 NOTE — Patient Instructions (Signed)

## 2018-09-19 NOTE — Progress Notes (Signed)
Hematology and Oncology Follow Up Visit  Amy White [id number] 08/09/1925 85 y.o. 09/19/2018   Principle Diagnosis:   Metastatic breast cancer-bone only metastasis  Current Therapy:    Aromasin 25 mg p.o. Daily -- d/c on 06/15/2018  Ribociclib 200 mg po q day (21/14) -- changed on 03/27/2018 -- patient stopped on her own in 04/2018  Faslodex 500 mg IM q month - start 07/25/2018  Ibrance 75 mg q day  (14on/14off)-- start on 07/28/2018  Xgeva 120 mg sq q 3 months - on hold permanently due to bad teeth.      Interim History:  Ms. Jasmer is back for follow-up.  She is doing pretty well.  She is managing to get through the coronavirus okay.  She basically is at home by herself.  She is very cautious.  She is not letting anybody in the house.  She is doing the Ibrance 14 days on and 14 days off.  I think this is reasonable for her.  I think this is allowing her white cells not to drop too low.  She has had no problems with cough or shortness of breath.  She has had no nausea or vomiting.  There is been no issues with bowels or bladder.  Her last CA 27.29 back in March was 93.  This was quite a jump.  She has had no bleeding.  Overall, her performance status is ECOG 2.  She does have significant cardiac issues.     Medications:  Current Outpatient Medications:  .  acetaminophen (TYLENOL) 500 MG tablet, Take 500 mg by mouth every 6 (six) hours as needed for fever., Disp: , Rfl:  .  albuterol (PROVENTIL HFA;VENTOLIN HFA) 108 (90 BASE) MCG/ACT inhaler, Inhale 2 puffs into the lungs every 6 (six) hours as needed for wheezing or shortness of breath., Disp: , Rfl:  .  ALPRAZolam (XANAX) 0.5 MG tablet, Take 0.25 mg by mouth 2 (two) times daily as needed for anxiety. , Disp: , Rfl: 0 .  bisoprolol (ZEBETA) 5 MG tablet, TAKE 2 TABLETS BY MOUTH ONCE DAILY, Disp: 60 tablet, Rfl: 5 .  diphenoxylate-atropine (LOMOTIL) 2.5-0.025 MG tablet, Take one tablet after each loose stool. Maximum  of 8 tablets per day. (Patient taking differently: Take 1 tablet by mouth 4 (four) times daily as needed for diarrhea or loose stools. Maximum of 8 tablets per day.), Disp: 30 tablet, Rfl: 1 .  feeding supplement, ENSURE ENLIVE, (ENSURE ENLIVE) LIQD, Take 237 mLs by mouth 2 (two) times daily between meals., Disp: 237 mL, Rfl: 12 .  furosemide (LASIX) 40 MG tablet, Take 20 mg by mouth daily as needed. , Disp: , Rfl:  .  levothyroxine (SYNTHROID, LEVOTHROID) 88 MCG tablet, Take 88 mcg by mouth daily., Disp: , Rfl:  .  oxyCODONE-acetaminophen (PERCOCET/ROXICET) 5-325 MG tablet, Take 1 tablet by mouth every 4 (four) hours as needed for severe pain (For pain.). , Disp: , Rfl:  .  palbociclib (IBRANCE) 75 MG capsule, Take 1 capsule (75 mg total) by mouth daily with breakfast. Take whole with food. Take for 21 days on, 7 days off, repeat every 28 days., Disp: 21 capsule, Rfl: 5 .  polyethylene glycol (MIRALAX / GLYCOLAX) packet, Take 17 g by mouth daily as needed for mild constipation., Disp: 14 each, Rfl: 0 .  potassium chloride SA (K-DUR,KLOR-CON) 10 MEQ tablet, Take 2 tablets (20 mEq total) by mouth daily., Disp: 60 tablet, Rfl: 6 .  tetrahydrozoline-zinc (VISINE-AC) 0.05-0.25 % ophthalmic solution, Place  2 drops into both eyes 3 (three) times daily as needed (red eyes)., Disp: , Rfl:  .  UNABLE TO FIND, Hair prosthesis, Disp: 1 each, Rfl: 0 .  Vitamin D, Ergocalciferol, (DRISDOL) 50000 units CAPS capsule, TAKE 1 CAPSULE BY MOUTH 1 TIME A WEEK, Disp: 12 capsule, Rfl: 3 .  warfarin (COUMADIN) 5 MG tablet, TAKE ONE-HALF TO 1 TABLET BY MOUTH DAILY AS DIRECTED BY COUMADIN CLINIC, Disp: 90 tablet, Rfl: 0  Allergies:  Allergies  Allergen Reactions  . Morphine And Related Rash  . Crestor [Rosuvastatin] Other (See Comments)    Reaction unknown  . Sulfa Antibiotics Other (See Comments)    Mouth breaks out in sores  . Ciprofloxacin Rash and Other (See Comments)    Rash in the mouth  . Penicillins Hives,  Rash and Other (See Comments)    Has patient had a PCN reaction causing immediate rash, facial/tongue/throat swelling, SOB or lightheadedness with hypotension: yes Has patient had a PCN reaction causing severe rash involving mucus membranes or skin necrosis: no Has patient had a PCN reaction that required hospitalization: no Has patient had a PCN reaction occurring within the last 10 years: yes If all of the above answers are "NO", then may proceed with Cephalosporin use.   . Vancomycin Rash and Other (See Comments)    "Red-man"    Past Medical History, Surgical history, Social history, and Family History were reviewed and updated.  Review of Systems: Review of Systems  Constitutional: Negative.   HENT:  Negative.   Eyes: Negative.   Respiratory: Negative.   Cardiovascular: Negative.   Gastrointestinal: Negative.   Endocrine: Negative.   Genitourinary: Negative.    Musculoskeletal: Positive for back pain and myalgias.  Skin: Negative.   Neurological: Negative.   Hematological: Negative.   Psychiatric/Behavioral: Negative.     Physical Exam:  weight is 154 lb (69.9 kg). Her oral temperature is 98.2 F (36.8 C). Her blood pressure is 120/64 and her pulse is 82. Her respiration is 19 and oxygen saturation is 97%.   Wt Readings from Last 3 Encounters:  09/19/18 154 lb (69.9 kg)  08/22/18 155 lb (70.3 kg)  07/25/18 153 lb (69.4 kg)    Physical Exam Vitals signs reviewed.  HENT:     Head: Normocephalic and atraumatic.  Eyes:     Pupils: Pupils are equal, round, and reactive to light.  Neck:     Musculoskeletal: Normal range of motion.  Cardiovascular:     Rate and Rhythm: Normal rate and regular rhythm.     Heart sounds: Normal heart sounds.     Comments: Cardiac exam is irregular rate and irregular rhythm consistent with atrial fibrillation.  The heart shows no obvious murmurs or rubs. Pulmonary:     Effort: Pulmonary effort is normal.     Breath sounds: Normal breath  sounds.  Abdominal:     General: Bowel sounds are normal.     Palpations: Abdomen is soft.  Musculoskeletal: Normal range of motion.        General: No tenderness or deformity.  Lymphadenopathy:     Cervical: No cervical adenopathy.  Skin:    General: Skin is warm and dry.     Findings: No erythema or rash.  Neurological:     Mental Status: She is alert and oriented to person, place, and time.  Psychiatric:        Behavior: Behavior normal.        Thought Content: Thought content normal.  Judgment: Judgment normal.      Lab Results  Component Value Date   WBC 2.0 (L) 09/19/2018   HGB 10.4 (L) 09/19/2018   HCT 31.4 (L) 09/19/2018   MCV 97.8 09/19/2018   PLT 76 (L) 09/19/2018     Chemistry      Component Value Date/Time   NA 141 09/19/2018 1359   NA 142 03/22/2018 1638   NA 146 (H) 01/25/2017 1448   NA 142 07/31/2015 1203   K 4.7 09/19/2018 1359   K 4.0 01/25/2017 1448   K 5.1 07/31/2015 1203   CL 106 09/19/2018 1359   CL 108 01/25/2017 1448   CO2 29 09/19/2018 1359   CO2 29 01/25/2017 1448   CO2 23 07/31/2015 1203   BUN 21 09/19/2018 1359   BUN 13 03/22/2018 1638   BUN 18 01/25/2017 1448   BUN 23.3 07/31/2015 1203   CREATININE 0.95 09/19/2018 1359   CREATININE 1.2 01/25/2017 1448   CREATININE 1.1 07/31/2015 1203      Component Value Date/Time   CALCIUM 9.9 09/19/2018 1359   CALCIUM 9.3 01/25/2017 1448   CALCIUM 9.3 07/31/2015 1203   ALKPHOS 125 09/19/2018 1359   ALKPHOS 64 01/25/2017 1448   ALKPHOS 57 07/31/2015 1203   AST 26 09/19/2018 1359   AST 18 07/31/2015 1203   ALT 20 09/19/2018 1359   ALT 20 01/25/2017 1448   ALT 15 07/31/2015 1203   BILITOT 1.3 (H) 09/19/2018 1359   BILITOT 0.96 07/31/2015 1203         Impression and Plan: Ms. Brosious is a 85 year old white female.  She has metastatic breast cancer.  It seems as if her disease is still confined to the bones.  Hopefully, the 14 days on and 14 days off of the [facility] is helping  her.  We will see what her CA 27.29 looks like.  If it is higher, then we may have to consider doing some scans on her.  This is all about quality of life.  This is all about making sure that she has a good quality of of life.  I will see her back in another month.Volanda Napoleon, MD 4/13/20202:48 PM

## 2018-09-20 LAB — CANCER ANTIGEN 27.29: CA 27.29: 40.8 U/mL — ABNORMAL HIGH (ref 0.0–38.6)

## 2018-09-28 ENCOUNTER — Other Ambulatory Visit: Payer: Self-pay | Admitting: *Deleted

## 2018-09-28 DIAGNOSIS — C7951 Secondary malignant neoplasm of bone: Principal | ICD-10-CM

## 2018-09-28 DIAGNOSIS — C50919 Malignant neoplasm of unspecified site of unspecified female breast: Secondary | ICD-10-CM

## 2018-09-28 MED ORDER — PALBOCICLIB 75 MG PO CAPS: 75.0000 mg | ORAL_CAPSULE | Freq: Every day | ORAL | 5 refills | Status: DC

## 2018-09-28 NOTE — Addendum Note (Signed)
Addended by: Darl Pikes on: 09/28/2018 04:25 PM   Modules accepted: Orders

## 2018-09-29 ENCOUNTER — Telehealth: Payer: Self-pay | Admitting: Pharmacist

## 2018-09-29 NOTE — Telephone Encounter (Signed)
Oral Chemotherapy Pharmacist Encounter   Patient received her [facility] for free through [facility] assistance program. Recently patient was changed to Ibrance 14 days on and 14 days off. New prescription sent to the assistance program dispensing pharamcy [facility].   Ms. Brower will ned to call [facility] for her refill (tele: [phone]). I called Ms. Angelo to prodive her with the phone number. She stated her understanding of the refill process and knows to call me if she has any issues with the process.  Darl Pikes, PharmD, BCPS,  [facility] Hematology/Oncology Clinical Pharmacist [facility] [phone]  09/29/2018 11:28 AM

## 2018-09-30 NOTE — Telephone Encounter (Signed)
Oral Chemotherapy Pharmacist Encounter   Patient called me, she is having trouble calling the phone number I provided for her refill. I think she is having trouble going through the automated call system. I called [facility] and set-up her medication delivery. Her [facility] will be delivered by 10/04/2018, no signature required.  Darl Pikes, PharmD, BCPS, [facility] Hematology/Oncology Clinical Pharmacist [facility] Oral [facility] [phone]  09/30/2018 12:11 PM

## 2018-10-11 ENCOUNTER — Ambulatory Visit (INDEPENDENT_AMBULATORY_CARE_PROVIDER_SITE_OTHER): Payer: Medicare Other | Admitting: *Deleted

## 2018-10-11 DIAGNOSIS — I495 Sick sinus syndrome: Secondary | ICD-10-CM

## 2018-10-12 LAB — CUP PACEART REMOTE DEVICE CHECK
Battery Remaining Longevity: 55 mo
Battery Voltage: 2.99 V
Brady Statistic AP VP Percent: 0.77 %
Brady Statistic AP VS Percent: 0.16 %
Brady Statistic AS VP Percent: 34.24 %
Brady Statistic AS VS Percent: 64.83 %
Brady Statistic RA Percent Paced: 0.77 %
Brady Statistic RV Percent Paced: 34.86 %
Date Time Interrogation Session: 20200505151956
Implantable Lead Implant Date: 20160223
Implantable Lead Implant Date: 20160223
Implantable Lead Location: 753859
Implantable Lead Location: 753860
Implantable Lead Model: 5076
Implantable Lead Model: 5076
Implantable Pulse Generator Implant Date: 20160223
Lead Channel Impedance Value: 323 Ohm
Lead Channel Impedance Value: 399 Ohm
Lead Channel Impedance Value: 418 Ohm
Lead Channel Impedance Value: 532 Ohm
Lead Channel Pacing Threshold Amplitude: 0.75 V
Lead Channel Pacing Threshold Amplitude: 0.875 V
Lead Channel Pacing Threshold Pulse Width: 0.4 ms
Lead Channel Pacing Threshold Pulse Width: 0.4 ms
Lead Channel Sensing Intrinsic Amplitude: 0.875 mV
Lead Channel Sensing Intrinsic Amplitude: 0.875 mV
Lead Channel Sensing Intrinsic Amplitude: 11 mV
Lead Channel Sensing Intrinsic Amplitude: 11 mV
Lead Channel Setting Pacing Amplitude: 1.5 V
Lead Channel Setting Pacing Amplitude: 2.25 V
Lead Channel Setting Pacing Pulse Width: 0.4 ms
Lead Channel Setting Sensing Sensitivity: 0.9 mV

## 2018-10-13 ENCOUNTER — Other Ambulatory Visit: Payer: Self-pay

## 2018-10-13 ENCOUNTER — Ambulatory Visit (INDEPENDENT_AMBULATORY_CARE_PROVIDER_SITE_OTHER): Payer: Medicare Other | Admitting: *Deleted

## 2018-10-13 DIAGNOSIS — Z7901 Long term (current) use of anticoagulants: Secondary | ICD-10-CM

## 2018-10-13 DIAGNOSIS — I4891 Unspecified atrial fibrillation: Secondary | ICD-10-CM | POA: Diagnosis not present

## 2018-10-13 DIAGNOSIS — C7951 Secondary malignant neoplasm of bone: Secondary | ICD-10-CM | POA: Diagnosis not present

## 2018-10-13 DIAGNOSIS — Z0000 Encounter for general adult medical examination without abnormal findings: Secondary | ICD-10-CM | POA: Diagnosis not present

## 2018-10-13 DIAGNOSIS — N183 Chronic kidney disease, stage 3 (moderate): Secondary | ICD-10-CM | POA: Diagnosis not present

## 2018-10-13 DIAGNOSIS — C50919 Malignant neoplasm of unspecified site of unspecified female breast: Secondary | ICD-10-CM | POA: Diagnosis not present

## 2018-10-13 LAB — POCT INR: INR: 2.3 (ref 2.0–3.0)

## 2018-10-17 ENCOUNTER — Telehealth: Payer: Self-pay | Admitting: Pharmacist

## 2018-10-17 NOTE — Telephone Encounter (Signed)
Oral Chemotherapy Pharmacist Encounter   Last month Ms. Calligan had trouble requesting a refill from the [facility] program. Today I called on behalf of Ms. Southern to request an Ibrance refill. [facility] will ship her I brance today with expected delivery by 10/20/18.   Darl Pikes, PharmD, BCPS, [facility] Hematology/Oncology Clinical Pharmacist [facility] Oral [facility] [phone]  10/17/2018 3:45 PM

## 2018-10-18 ENCOUNTER — Encounter: Payer: Self-pay | Admitting: Cardiology

## 2018-10-18 NOTE — Progress Notes (Signed)
Remote pacemaker transmission.   

## 2018-10-20 ENCOUNTER — Inpatient Hospital Stay: Payer: Medicare Other | Attending: Hematology & Oncology | Admitting: Hematology & Oncology

## 2018-10-20 ENCOUNTER — Encounter: Payer: Self-pay | Admitting: Hematology & Oncology

## 2018-10-20 ENCOUNTER — Other Ambulatory Visit: Payer: Self-pay

## 2018-10-20 ENCOUNTER — Inpatient Hospital Stay: Payer: Medicare Other

## 2018-10-20 ENCOUNTER — Other Ambulatory Visit: Payer: Self-pay | Admitting: *Deleted

## 2018-10-20 VITALS — BP 121/59 | HR 88 | Temp 98.2°F | Resp 19 | Wt 150.0 lb

## 2018-10-20 DIAGNOSIS — Z5111 Encounter for antineoplastic chemotherapy: Secondary | ICD-10-CM | POA: Insufficient documentation

## 2018-10-20 DIAGNOSIS — Z79899 Other long term (current) drug therapy: Secondary | ICD-10-CM | POA: Diagnosis not present

## 2018-10-20 DIAGNOSIS — C50911 Malignant neoplasm of unspecified site of right female breast: Secondary | ICD-10-CM

## 2018-10-20 DIAGNOSIS — C50919 Malignant neoplasm of unspecified site of unspecified female breast: Secondary | ICD-10-CM

## 2018-10-20 DIAGNOSIS — C7951 Secondary malignant neoplasm of bone: Secondary | ICD-10-CM

## 2018-10-20 LAB — CBC WITH DIFFERENTIAL (CANCER CENTER ONLY)
Abs Immature Granulocytes: 0.01 10*3/uL (ref 0.00–0.07)
Basophils Absolute: 0 10*3/uL (ref 0.0–0.1)
Basophils Relative: 1 %
Eosinophils Absolute: 0.1 10*3/uL (ref 0.0–0.5)
Eosinophils Relative: 3 %
HCT: 30.7 % — ABNORMAL LOW (ref 36.0–46.0)
Hemoglobin: 10.5 g/dL — ABNORMAL LOW (ref 12.0–15.0)
Immature Granulocytes: 1 %
Lymphocytes Relative: 44 %
Lymphs Abs: 1 10*3/uL (ref 0.7–4.0)
MCH: 33.9 pg (ref 26.0–34.0)
MCHC: 34.2 g/dL (ref 30.0–36.0)
MCV: 99 fL (ref 80.0–100.0)
Monocytes Absolute: 0.3 10*3/uL (ref 0.1–1.0)
Monocytes Relative: 14 %
Neutro Abs: 0.8 10*3/uL — ABNORMAL LOW (ref 1.7–7.7)
Neutrophils Relative %: 37 %
Platelet Count: 79 10*3/uL — ABNORMAL LOW (ref 150–400)
RBC: 3.1 MIL/uL — ABNORMAL LOW (ref 3.87–5.11)
RDW: 18.2 % — ABNORMAL HIGH (ref 11.5–15.5)
WBC Count: 2.2 10*3/uL — ABNORMAL LOW (ref 4.0–10.5)
nRBC: 0 % (ref 0.0–0.2)

## 2018-10-20 LAB — CMP (CANCER CENTER ONLY)
ALT: 19 U/L (ref 0–44)
AST: 23 U/L (ref 15–41)
Albumin: 3.9 g/dL (ref 3.5–5.0)
Alkaline Phosphatase: 109 U/L (ref 38–126)
Anion gap: 6 (ref 5–15)
BUN: 20 mg/dL (ref 8–23)
CO2: 27 mmol/L (ref 22–32)
Calcium: 10.1 mg/dL (ref 8.9–10.3)
Chloride: 107 mmol/L (ref 98–111)
Creatinine: 0.82 mg/dL (ref 0.44–1.00)
GFR, Est AFR Am: >60 mL/min (ref >60)
GFR, Estimated: >60 mL/min (ref >60)
Glucose, Bld: 103 mg/dL — ABNORMAL HIGH (ref 70–99)
Potassium: 4.2 mmol/L (ref 3.5–5.1)
Sodium: 140 mmol/L (ref 135–145)
Total Bilirubin: 2.2 mg/dL — ABNORMAL HIGH (ref 0.3–1.2)
Total Protein: 6.7 g/dL (ref 6.5–8.1)

## 2018-10-20 MED ORDER — FULVESTRANT 250 MG/5ML IM SOLN
INTRAMUSCULAR | Status: AC
  Filled 2018-10-20: qty 5

## 2018-10-20 MED ORDER — FULVESTRANT 250 MG/5ML IM SOLN
500.0000 mg | INTRAMUSCULAR | Status: DC
  Administered 2018-10-20: 500 mg via INTRAMUSCULAR

## 2018-10-20 NOTE — Patient Instructions (Signed)

## 2018-10-20 NOTE — Progress Notes (Signed)
Hematology and Oncology Follow Up Visit  Amy White [id number] [birth year] 85 y.o. 10/20/2018   Principle Diagnosis:   Metastatic breast cancer-bone only metastasis  Current Therapy:    Aromasin 25 mg p.o. Daily -- d/c on 06/15/2018  Ribociclib 200 mg po q day (21/14) -- changed on 03/27/2018 -- patient stopped on her own in 04/2018  Faslodex 500 mg IM q month - start 07/25/2018  Ibrance 75 mg q day  (14on/14off)-- start on 07/28/2018  Xgeva 120 mg sq q 3 months - on hold permanently due to bad teeth.      Interim History:  Amy White is back for follow-up.  She is doing okay.  She is making it through the coronavirus.  She has not gone anywhere.  No one really is coming over to her house.  Thankfully, the Faslodex/Ibrance is doing a great job.  Her last CA 27.29 was down to 41.  She had no pain.  She does have bad arthritis.  There is been no fever.  She is had no cough.  She has had no rashes.  There is been no nausea or vomiting.  She has had no diarrhea.  Thankfully, her heart is doing okay right now.  Overall, her performance status is ECOG 2.   Medications:  Current Outpatient Medications:  .  acetaminophen (TYLENOL) 500 MG tablet, Take 500 mg by mouth every 6 (six) hours as needed for fever., Disp: , Rfl:  .  albuterol (PROVENTIL HFA;VENTOLIN HFA) 108 (90 BASE) MCG/ACT inhaler, Inhale 2 puffs into the lungs every 6 (six) hours as needed for wheezing or shortness of breath., Disp: , Rfl:  .  ALPRAZolam (XANAX) 0.5 MG tablet, Take 0.25 mg by mouth 2 (two) times daily as needed for anxiety. , Disp: , Rfl: 0 .  bisoprolol (ZEBETA) 5 MG tablet, TAKE 2 TABLETS BY MOUTH ONCE DAILY, Disp: 60 tablet, Rfl: 5 .  cromolyn (OPTICROM) 4 % ophthalmic solution, INT 1 GTT IN OU QID, Disp: , Rfl:  .  diphenoxylate-atropine (LOMOTIL) 2.5-0.025 MG tablet, Take one tablet after each loose stool. Maximum of 8 tablets per day. (Patient taking differently: Take 1 tablet by mouth 4  (four) times daily as needed for diarrhea or loose stools. Maximum of 8 tablets per day.), Disp: 30 tablet, Rfl: 1 .  feeding supplement, ENSURE ENLIVE, (ENSURE ENLIVE) LIQD, Take 237 mLs by mouth 2 (two) times daily between meals., Disp: 237 mL, Rfl: 12 .  furosemide (LASIX) 40 MG tablet, Take 20 mg by mouth daily as needed. , Disp: , Rfl:  .  levothyroxine (SYNTHROID, LEVOTHROID) 88 MCG tablet, Take 88 mcg by mouth daily., Disp: , Rfl:  .  oxyCODONE-acetaminophen (PERCOCET/ROXICET) 5-325 MG tablet, Take 1 tablet by mouth every 4 (four) hours as needed for severe pain (For pain.). , Disp: , Rfl:  .  palbociclib (IBRANCE) 75 MG capsule, Take 1 capsule (75 mg total) by mouth daily with breakfast. Take whole with food. Take for 14 days on, 14 days off, repeat every 28 days., Disp: 14 capsule, Rfl: 5 .  polyethylene glycol (MIRALAX / GLYCOLAX) packet, Take 17 g by mouth daily as needed for mild constipation., Disp: 14 each, Rfl: 0 .  potassium chloride SA (K-DUR,KLOR-CON) 10 MEQ tablet, Take 2 tablets (20 mEq total) by mouth daily., Disp: 60 tablet, Rfl: 6 .  tetrahydrozoline-zinc (VISINE-AC) 0.05-0.25 % ophthalmic solution, Place 2 drops into both eyes 3 (three) times daily as needed (red eyes)., Disp: , Rfl:  .  UNABLE TO FIND, Hair prosthesis, Disp: 1 each, Rfl: 0 .  Vitamin D, Ergocalciferol, (DRISDOL) 50000 units CAPS capsule, TAKE 1 CAPSULE BY MOUTH 1 TIME A WEEK, Disp: 12 capsule, Rfl: 3 .  warfarin (COUMADIN) 5 MG tablet, TAKE ONE-HALF TO 1 TABLET BY MOUTH DAILY AS DIRECTED BY COUMADIN CLINIC, Disp: 90 tablet, Rfl: 0  Allergies:  Allergies  Allergen Reactions  . Morphine And Related Rash  . Crestor [Rosuvastatin] Other (See Comments)    Reaction unknown  . Sulfa Antibiotics Other (See Comments)    Mouth breaks out in sores  . Ciprofloxacin Rash and Other (See Comments)    Rash in the mouth  . Penicillins Hives, Rash and Other (See Comments)    Has patient had a PCN reaction causing  immediate rash, facial/tongue/throat swelling, SOB or lightheadedness with hypotension: yes Has patient had a PCN reaction causing severe rash involving mucus membranes or skin necrosis: no Has patient had a PCN reaction that required hospitalization: no Has patient had a PCN reaction occurring within the last 10 years: yes If all of the above answers are "NO", then may proceed with Cephalosporin use.   . Vancomycin Rash and Other (See Comments)    "Red-man"    Past Medical History, Surgical history, Social history, and Family History were reviewed and updated.  Review of Systems: Review of Systems  Constitutional: Negative.   HENT:  Negative.   Eyes: Negative.   Respiratory: Negative.   Cardiovascular: Negative.   Gastrointestinal: Negative.   Endocrine: Negative.   Genitourinary: Negative.    Musculoskeletal: Positive for back pain and myalgias.  Skin: Negative.   Neurological: Negative.   Hematological: Negative.   Psychiatric/Behavioral: Negative.     Physical Exam:  weight is 150 lb (68 kg). Her oral temperature is 98.2 F (36.8 C). Her blood pressure is 121/59 (abnormal) and her pulse is 88. Her respiration is 19 and oxygen saturation is 100%.   Wt Readings from Last 3 Encounters:  10/20/18 150 lb (68 kg)  09/19/18 154 lb (69.9 kg)  08/22/18 155 lb (70.3 kg)    Physical Exam Vitals signs reviewed.  HENT:     Head: Normocephalic and atraumatic.  Eyes:     Pupils: Pupils are equal, round, and reactive to light.  Neck:     Musculoskeletal: Normal range of motion.  Cardiovascular:     Rate and Rhythm: Normal rate and regular rhythm.     Heart sounds: Normal heart sounds.     Comments: Cardiac exam is irregular rate and irregular rhythm consistent with atrial fibrillation.  The heart shows no obvious murmurs or rubs. Pulmonary:     Effort: Pulmonary effort is normal.     Breath sounds: Normal breath sounds.  Abdominal:     General: Bowel sounds are normal.      Palpations: Abdomen is soft.  Musculoskeletal: Normal range of motion.        General: No tenderness or deformity.  Lymphadenopathy:     Cervical: No cervical adenopathy.  Skin:    General: Skin is warm and dry.     Findings: No erythema or rash.  Neurological:     Mental Status: She is alert and oriented to person, place, and time.  Psychiatric:        Behavior: Behavior normal.        Thought Content: Thought content normal.        Judgment: Judgment normal.      Lab Results  Component Value  Date   WBC 2.2 (L) 10/20/2018   HGB 10.5 (L) 10/20/2018   HCT 30.7 (L) 10/20/2018   MCV 99.0 10/20/2018   PLT 79 (L) 10/20/2018     Chemistry      Component Value Date/Time   NA 140 10/20/2018 1345   NA 142 03/22/2018 1638   NA 146 (H) 01/25/2017 1448   NA 142 07/31/2015 1203   K 4.2 10/20/2018 1345   K 4.0 01/25/2017 1448   K 5.1 07/31/2015 1203   CL 107 10/20/2018 1345   CL 108 01/25/2017 1448   CO2 27 10/20/2018 1345   CO2 29 01/25/2017 1448   CO2 23 07/31/2015 1203   BUN 20 10/20/2018 1345   BUN 13 03/22/2018 1638   BUN 18 01/25/2017 1448   BUN 23.3 07/31/2015 1203   CREATININE 0.82 10/20/2018 1345   CREATININE 1.2 01/25/2017 1448   CREATININE 1.1 07/31/2015 1203      Component Value Date/Time   CALCIUM 10.1 10/20/2018 1345   CALCIUM 9.3 01/25/2017 1448   CALCIUM 9.3 07/31/2015 1203   ALKPHOS 109 10/20/2018 1345   ALKPHOS 64 01/25/2017 1448   ALKPHOS 57 07/31/2015 1203   AST 23 10/20/2018 1345   AST 18 07/31/2015 1203   ALT 19 10/20/2018 1345   ALT 20 01/25/2017 1448   ALT 15 07/31/2015 1203   BILITOT 2.2 (H) 10/20/2018 1345   BILITOT 0.96 07/31/2015 1203         Impression and Plan: Amy White is a 85 year old white female.  She has metastatic breast cancer.  It seems as if her disease is still confined to the bones.  We will have to see what her CA 27.29 is.  Hopefully it will be even lower.  We will plan to get her back in another 4 weeks.  I  probably would not do any scans on her until July.     Volanda Napoleon, MD 5/14/20202:26 PM

## 2018-10-21 LAB — CANCER ANTIGEN 27.29: CA 27.29: 52.3 U/mL — ABNORMAL HIGH (ref 0.0–38.6)

## 2018-10-21 LAB — TSH: TSH: 1.139 u[IU]/mL (ref 0.308–3.960)

## 2018-11-02 ENCOUNTER — Telehealth: Payer: Self-pay

## 2018-11-02 MED ORDER — WARFARIN SODIUM 5 MG PO TABS: ORAL_TABLET | ORAL | 0 refills | Status: DC

## 2018-11-02 NOTE — Telephone Encounter (Signed)
Rx sent to prefer pharmacy 

## 2018-11-14 ENCOUNTER — Telehealth: Payer: Self-pay | Admitting: Pharmacist

## 2018-11-14 NOTE — Telephone Encounter (Signed)
Oral Chemotherapy Pharmacist Encounter   Ms. Amy White has trouble requesting an [place] refill from the [employer]. Today I called on behalf of Amy White to request an Ibrance refill. [facility] will ship her I brance today with expected delivery by 11/16/18 or 11/17/18.   Darl Pikes, PharmD, BCPS, [facility] Hematology/Oncology Clinical Pharmacist [facility]/HP/AP Oral [facility] [phone]  11/14/2018 10:00 AM

## 2018-11-16 ENCOUNTER — Telehealth: Payer: Self-pay

## 2018-11-16 NOTE — Telephone Encounter (Signed)
Unable to lmom for prescreen  

## 2018-11-17 ENCOUNTER — Other Ambulatory Visit: Payer: Self-pay

## 2018-11-17 ENCOUNTER — Inpatient Hospital Stay: Payer: Medicare Other

## 2018-11-17 ENCOUNTER — Inpatient Hospital Stay: Payer: Medicare Other | Attending: Hematology & Oncology | Admitting: Hematology & Oncology

## 2018-11-17 ENCOUNTER — Encounter: Payer: Self-pay | Admitting: Hematology & Oncology

## 2018-11-17 VITALS — BP 124/64 | HR 85 | Temp 97.2°F | Resp 20 | Wt 147.0 lb

## 2018-11-17 DIAGNOSIS — C50911 Malignant neoplasm of unspecified site of right female breast: Secondary | ICD-10-CM

## 2018-11-17 DIAGNOSIS — C50919 Malignant neoplasm of unspecified site of unspecified female breast: Secondary | ICD-10-CM | POA: Insufficient documentation

## 2018-11-17 DIAGNOSIS — I4891 Unspecified atrial fibrillation: Secondary | ICD-10-CM | POA: Insufficient documentation

## 2018-11-17 DIAGNOSIS — C7951 Secondary malignant neoplasm of bone: Secondary | ICD-10-CM | POA: Insufficient documentation

## 2018-11-17 DIAGNOSIS — Z79811 Long term (current) use of aromatase inhibitors: Secondary | ICD-10-CM | POA: Diagnosis not present

## 2018-11-17 DIAGNOSIS — Z7901 Long term (current) use of anticoagulants: Secondary | ICD-10-CM | POA: Diagnosis not present

## 2018-11-17 DIAGNOSIS — Z5111 Encounter for antineoplastic chemotherapy: Secondary | ICD-10-CM | POA: Insufficient documentation

## 2018-11-17 DIAGNOSIS — Z79899 Other long term (current) drug therapy: Secondary | ICD-10-CM | POA: Insufficient documentation

## 2018-11-17 LAB — CBC WITH DIFFERENTIAL (CANCER CENTER ONLY)
Abs Immature Granulocytes: 0.02 10*3/uL (ref 0.00–0.07)
Basophils Absolute: 0.1 10*3/uL (ref 0.0–0.1)
Basophils Relative: 1 %
Eosinophils Absolute: 0.4 10*3/uL (ref 0.0–0.5)
Eosinophils Relative: 9 %
HCT: 35.1 % — ABNORMAL LOW (ref 36.0–46.0)
Hemoglobin: 11.7 g/dL — ABNORMAL LOW (ref 12.0–15.0)
Immature Granulocytes: 1 %
Lymphocytes Relative: 26 %
Lymphs Abs: 1.1 10*3/uL (ref 0.7–4.0)
MCH: 33.1 pg (ref 26.0–34.0)
MCHC: 33.3 g/dL (ref 30.0–36.0)
MCV: 99.4 fL (ref 80.0–100.0)
Monocytes Absolute: 0.4 10*3/uL (ref 0.1–1.0)
Monocytes Relative: 10 %
Neutro Abs: 2.3 10*3/uL (ref 1.7–7.7)
Neutrophils Relative %: 53 %
Platelet Count: 189 10*3/uL (ref 150–400)
RBC: 3.53 MIL/uL — ABNORMAL LOW (ref 3.87–5.11)
RDW: 14.2 % (ref 11.5–15.5)
WBC Count: 4.3 10*3/uL (ref 4.0–10.5)
nRBC: 0 % (ref 0.0–0.2)

## 2018-11-17 LAB — CMP (CANCER CENTER ONLY)
ALT: 20 U/L (ref 0–44)
AST: 27 U/L (ref 15–41)
Albumin: 4 g/dL (ref 3.5–5.0)
Alkaline Phosphatase: 128 U/L — ABNORMAL HIGH (ref 38–126)
Anion gap: 6 (ref 5–15)
BUN: 19 mg/dL (ref 8–23)
CO2: 29 mmol/L (ref 22–32)
Calcium: 10 mg/dL (ref 8.9–10.3)
Chloride: 105 mmol/L (ref 98–111)
Creatinine: 0.82 mg/dL (ref 0.44–1.00)
GFR, Est AFR Am: >60 mL/min (ref >60)
GFR, Estimated: >60 mL/min (ref >60)
Glucose, Bld: 105 mg/dL — ABNORMAL HIGH (ref 70–99)
Potassium: 4.3 mmol/L (ref 3.5–5.1)
Sodium: 140 mmol/L (ref 135–145)
Total Bilirubin: 2.1 mg/dL — ABNORMAL HIGH (ref 0.3–1.2)
Total Protein: 7.1 g/dL (ref 6.5–8.1)

## 2018-11-17 MED ORDER — FULVESTRANT 250 MG/5ML IM SOLN
500.0000 mg | INTRAMUSCULAR | Status: DC
  Administered 2018-11-17: 500 mg via INTRAMUSCULAR

## 2018-11-17 NOTE — Progress Notes (Signed)
Hematology and Oncology Follow Up Visit  Amy White [id number] September 09, 1925 85 y.o. 11/17/2018   Principle Diagnosis:   Metastatic breast cancer-bone only metastasis  Current Therapy:    Aromasin 25 mg p.o. Daily -- d/c on 06/15/2018  Ribociclib 200 mg po q day (21/14) -- changed on 03/27/2018 -- patient stopped on her own in 04/2018  Faslodex 500 mg IM q month - start 07/25/2018  Ibrance 75 mg q day  (14on/14off)-- start on 07/28/2018  Xgeva 120 mg sq q 3 months - on hold permanently due to bad teeth.      Interim History:  Amy White is back for follow-up.  She is doing okay.  She is making it through the coronavirus.  She has not gone anywhere.  No one really is coming over to her house.  Thankfully, her blood counts have been doing quite well.  Her white cell count is doing great.  Platelet count is back up.  I think that the 14-day on/40-day off of [facility] is helping her with her blood counts.  Her last CA 27.29 was up a little bit at 52 so we will have to watch this closely.  She just has horrible arthritis.  She has bad atrial fibrillation.  She is on blood thinner.  She has had no bleeding.  Her appetite is okay.  Overall, her performance status is ECOG 2.   Medications:  Current Outpatient Medications:  .  acetaminophen (TYLENOL) 500 MG tablet, Take 500 mg by mouth every 6 (six) hours as needed for fever., Disp: , Rfl:  .  albuterol (PROVENTIL HFA;VENTOLIN HFA) 108 (90 BASE) MCG/ACT inhaler, Inhale 2 puffs into the lungs every 6 (six) hours as needed for wheezing or shortness of breath., Disp: , Rfl:  .  ALPRAZolam (XANAX) 0.5 MG tablet, Take 0.25 mg by mouth 2 (two) times daily as needed for anxiety. , Disp: , Rfl: 0 .  bisoprolol (ZEBETA) 5 MG tablet, TAKE 2 TABLETS BY MOUTH ONCE DAILY, Disp: 60 tablet, Rfl: 5 .  cromolyn (OPTICROM) 4 % ophthalmic solution, INT 1 GTT IN OU QID, Disp: , Rfl:  .  diphenoxylate-atropine (LOMOTIL) 2.5-0.025 MG tablet, Take one  tablet after each loose stool. Maximum of 8 tablets per day. (Patient taking differently: Take 1 tablet by mouth 4 (four) times daily as needed for diarrhea or loose stools. Maximum of 8 tablets per day.), Disp: 30 tablet, Rfl: 1 .  feeding supplement, ENSURE ENLIVE, (ENSURE ENLIVE) LIQD, Take 237 mLs by mouth 2 (two) times daily between meals., Disp: 237 mL, Rfl: 12 .  furosemide (LASIX) 40 MG tablet, Take 20 mg by mouth daily as needed. , Disp: , Rfl:  .  levothyroxine (SYNTHROID, LEVOTHROID) 88 MCG tablet, Take 88 mcg by mouth daily., Disp: , Rfl:  .  oxyCODONE-acetaminophen (PERCOCET/ROXICET) 5-325 MG tablet, Take 1 tablet by mouth every 4 (four) hours as needed for severe pain (For pain.). , Disp: , Rfl:  .  palbociclib (IBRANCE) 75 MG capsule, Take 1 capsule (75 mg total) by mouth daily with breakfast. Take whole with food. Take for 14 days on, 14 days off, repeat every 28 days., Disp: 14 capsule, Rfl: 5 .  polyethylene glycol (MIRALAX / GLYCOLAX) packet, Take 17 g by mouth daily as needed for mild constipation., Disp: 14 each, Rfl: 0 .  potassium chloride SA (K-DUR,KLOR-CON) 10 MEQ tablet, Take 2 tablets (20 mEq total) by mouth daily., Disp: 60 tablet, Rfl: 6 .  tetrahydrozoline-zinc (VISINE-AC) 0.05-0.25 %  ophthalmic solution, Place 2 drops into both eyes 3 (three) times daily as needed (red eyes)., Disp: , Rfl:  .  UNABLE TO FIND, Hair prosthesis, Disp: 1 each, Rfl: 0 .  Vitamin D, Ergocalciferol, (DRISDOL) 50000 units CAPS capsule, TAKE 1 CAPSULE BY MOUTH 1 TIME A WEEK, Disp: 12 capsule, Rfl: 3 .  warfarin (COUMADIN) 5 MG tablet, TAKE ONE-HALF TO 1 TABLET BY MOUTH DAILY AS DIRECTED BY COUMADIN CLINIC, Disp: 90 tablet, Rfl: 0  Allergies:  Allergies  Allergen Reactions  . Morphine And Related Rash  . Crestor [Rosuvastatin] Other (See Comments)    Reaction unknown  . Sulfa Antibiotics Other (See Comments)    Mouth breaks out in sores  . Ciprofloxacin Rash and Other (See Comments)    Rash  in the mouth  . Penicillins Hives, Rash and Other (See Comments)    Has patient had a PCN reaction causing immediate rash, facial/tongue/throat swelling, SOB or lightheadedness with hypotension: yes Has patient had a PCN reaction causing severe rash involving mucus membranes or skin necrosis: no Has patient had a PCN reaction that required hospitalization: no Has patient had a PCN reaction occurring within the last 10 years: yes If all of the above answers are "NO", then may proceed with Cephalosporin use.   . Vancomycin Rash and Other (See Comments)    "Red-man"    Past Medical History, Surgical history, Social history, and Family History were reviewed and updated.  Review of Systems: Review of Systems  Constitutional: Negative.   HENT:  Negative.   Eyes: Negative.   Respiratory: Negative.   Cardiovascular: Negative.   Gastrointestinal: Negative.   Endocrine: Negative.   Genitourinary: Negative.    Musculoskeletal: Positive for back pain and myalgias.  Skin: Negative.   Neurological: Negative.   Hematological: Negative.   Psychiatric/Behavioral: Negative.     Physical Exam:  vitals were not taken for this visit.   Wt Readings from Last 3 Encounters:  10/20/18 150 lb (68 kg)  09/19/18 154 lb (69.9 kg)  08/22/18 155 lb (70.3 kg)    Physical Exam Vitals signs reviewed.  HENT:     Head: Normocephalic and atraumatic.  Eyes:     Pupils: Pupils are equal, round, and reactive to light.  Neck:     Musculoskeletal: Normal range of motion.  Cardiovascular:     Rate and Rhythm: Normal rate and regular rhythm.     Heart sounds: Normal heart sounds.     Comments: Cardiac exam is irregular rate and irregular rhythm consistent with atrial fibrillation.  The heart shows no obvious murmurs or rubs. Pulmonary:     Effort: Pulmonary effort is normal.     Breath sounds: Normal breath sounds.  Abdominal:     General: Bowel sounds are normal.     Palpations: Abdomen is soft.   Musculoskeletal: Normal range of motion.        General: No tenderness or deformity.  Lymphadenopathy:     Cervical: No cervical adenopathy.  Skin:    General: Skin is warm and dry.     Findings: No erythema or rash.  Neurological:     Mental Status: She is alert and oriented to person, place, and time.  Psychiatric:        Behavior: Behavior normal.        Thought Content: Thought content normal.        Judgment: Judgment normal.      Lab Results  Component Value Date   WBC 4.3  11/17/2018   HGB 11.7 (L) 11/17/2018   HCT 35.1 (L) 11/17/2018   MCV 99.4 11/17/2018   PLT 189 11/17/2018     Chemistry      Component Value Date/Time   NA 140 11/17/2018 1401   NA 142 03/22/2018 1638   NA 146 (H) 01/25/2017 1448   NA 142 07/31/2015 1203   K 4.3 11/17/2018 1401   K 4.0 01/25/2017 1448   K 5.1 07/31/2015 1203   CL 105 11/17/2018 1401   CL 108 01/25/2017 1448   CO2 29 11/17/2018 1401   CO2 29 01/25/2017 1448   CO2 23 07/31/2015 1203   BUN 19 11/17/2018 1401   BUN 13 03/22/2018 1638   BUN 18 01/25/2017 1448   BUN 23.3 07/31/2015 1203   CREATININE 0.82 11/17/2018 1401   CREATININE 1.2 01/25/2017 1448   CREATININE 1.1 07/31/2015 1203      Component Value Date/Time   CALCIUM 10.0 11/17/2018 1401   CALCIUM 9.3 01/25/2017 1448   CALCIUM 9.3 07/31/2015 1203   ALKPHOS 128 (H) 11/17/2018 1401   ALKPHOS 64 01/25/2017 1448   ALKPHOS 57 07/31/2015 1203   AST 27 11/17/2018 1401   AST 18 07/31/2015 1203   ALT 20 11/17/2018 1401   ALT 20 01/25/2017 1448   ALT 15 07/31/2015 1203   BILITOT 2.1 (H) 11/17/2018 1401   BILITOT 0.96 07/31/2015 1203         Impression and Plan: Amy White is a 85 year old white female.  She has metastatic breast cancer.  It seems as if her disease is still confined to the bones.  We will have to see what her CA 27.29 is.  Hopefully it will be stable or lower.  Our options after endocrine therapy really are not that great.  Given her age and  her overall health issues, I really do not think that she would be a candidate for any type of chemotherapy.  We will plan to get her back in another 4 weeks.  I probably would not do any scans on her until July.     Volanda Napoleon, MD 6/11/20203:02 PM

## 2018-11-17 NOTE — Patient Instructions (Signed)

## 2018-11-18 LAB — CANCER ANTIGEN 27.29: CA 27.29: 50.3 U/mL — ABNORMAL HIGH (ref 0.0–38.6)

## 2018-12-01 ENCOUNTER — Other Ambulatory Visit: Payer: Self-pay

## 2018-12-01 ENCOUNTER — Ambulatory Visit (INDEPENDENT_AMBULATORY_CARE_PROVIDER_SITE_OTHER): Payer: Medicare Other | Admitting: Pharmacist

## 2018-12-01 ENCOUNTER — Telehealth: Payer: Self-pay | Admitting: Internal Medicine

## 2018-12-01 DIAGNOSIS — Z7901 Long term (current) use of anticoagulants: Secondary | ICD-10-CM

## 2018-12-01 DIAGNOSIS — I4891 Unspecified atrial fibrillation: Secondary | ICD-10-CM

## 2018-12-01 LAB — POCT INR: INR: 2.4 (ref 2.0–3.0)

## 2018-12-02 NOTE — Telephone Encounter (Signed)
Encounter opened in error

## 2018-12-07 ENCOUNTER — Other Ambulatory Visit: Payer: Self-pay | Admitting: Hematology & Oncology

## 2018-12-07 DIAGNOSIS — N760 Acute vaginitis: Secondary | ICD-10-CM | POA: Diagnosis not present

## 2018-12-07 DIAGNOSIS — D72819 Decreased white blood cell count, unspecified: Secondary | ICD-10-CM | POA: Diagnosis not present

## 2018-12-07 DIAGNOSIS — R300 Dysuria: Secondary | ICD-10-CM | POA: Diagnosis not present

## 2018-12-15 ENCOUNTER — Inpatient Hospital Stay: Payer: Medicare Other | Attending: Hematology & Oncology | Admitting: Hematology & Oncology

## 2018-12-15 ENCOUNTER — Inpatient Hospital Stay: Payer: Medicare Other

## 2018-12-22 ENCOUNTER — Other Ambulatory Visit: Payer: Self-pay | Admitting: Hematology & Oncology

## 2018-12-22 DIAGNOSIS — E559 Vitamin D deficiency, unspecified: Secondary | ICD-10-CM

## 2019-01-10 ENCOUNTER — Ambulatory Visit (INDEPENDENT_AMBULATORY_CARE_PROVIDER_SITE_OTHER): Payer: Medicare Other | Admitting: *Deleted

## 2019-01-10 DIAGNOSIS — I495 Sick sinus syndrome: Secondary | ICD-10-CM | POA: Diagnosis not present

## 2019-01-10 LAB — CUP PACEART REMOTE DEVICE CHECK
Battery Remaining Longevity: 53 mo
Battery Voltage: 2.99 V
Brady Statistic AP VP Percent: 0.69 %
Brady Statistic AP VS Percent: 0.15 %
Brady Statistic AS VP Percent: 36.88 %
Brady Statistic AS VS Percent: 62.28 %
Brady Statistic RA Percent Paced: 0.68 %
Brady Statistic RV Percent Paced: 37.69 %
Date Time Interrogation Session: 20200804150502
Implantable Lead Implant Date: 20160223
Implantable Lead Implant Date: 20160223
Implantable Lead Location: 753859
Implantable Lead Location: 753860
Implantable Lead Model: 5076
Implantable Lead Model: 5076
Implantable Pulse Generator Implant Date: 20160223
Lead Channel Impedance Value: 304 Ohm
Lead Channel Impedance Value: 399 Ohm
Lead Channel Impedance Value: 418 Ohm
Lead Channel Impedance Value: 551 Ohm
Lead Channel Pacing Threshold Amplitude: 0.75 V
Lead Channel Pacing Threshold Amplitude: 0.75 V
Lead Channel Pacing Threshold Pulse Width: 0.4 ms
Lead Channel Pacing Threshold Pulse Width: 0.4 ms
Lead Channel Sensing Intrinsic Amplitude: 0.625 mV
Lead Channel Sensing Intrinsic Amplitude: 0.625 mV
Lead Channel Sensing Intrinsic Amplitude: 10.375 mV
Lead Channel Sensing Intrinsic Amplitude: 10.375 mV
Lead Channel Setting Pacing Amplitude: 1.5 V
Lead Channel Setting Pacing Amplitude: 2 V
Lead Channel Setting Pacing Pulse Width: 0.4 ms
Lead Channel Setting Sensing Sensitivity: 0.9 mV

## 2019-01-11 ENCOUNTER — Telehealth: Payer: Self-pay | Admitting: Pharmacy Technician

## 2019-01-11 NOTE — Telephone Encounter (Signed)
Oral Oncology Patient Advocate Encounter  Received a fax from [facility] that they were unable to reach Amy White to set up the refill of her Ibrance.  I called Amy White and left a voicemail.  She called me back and I asked her if she was out of medication.  She states that she still has some medication on hand and was currently on her 2 weeks off.  I told her to see how many doses of Ibrance she has on hand and then call [employer] to let them know how much medication she has on hand.  [employer] rep should be able to reschedule her call to be closer to her needing the next refill.  Amy White understood.  I gave her [employer]'s phone number to call after she counts how many Ibrance she has on hand.    [facility] [facility] Phone [phone] Fax [phone] 01/11/2019 10:03 AM

## 2019-01-12 ENCOUNTER — Other Ambulatory Visit: Payer: Self-pay

## 2019-01-12 ENCOUNTER — Inpatient Hospital Stay: Payer: Medicare Other | Admitting: Hematology & Oncology

## 2019-01-12 ENCOUNTER — Inpatient Hospital Stay: Payer: Medicare Other

## 2019-01-12 MED ORDER — BISOPROLOL FUMARATE 5 MG PO TABS: 10.0000 mg | ORAL_TABLET | Freq: Every day | ORAL | 1 refills | Status: DC

## 2019-01-12 NOTE — Telephone Encounter (Signed)
Rx(s) sent to pharmacy electronically.  

## 2019-01-17 ENCOUNTER — Encounter: Payer: Self-pay | Admitting: Cardiology

## 2019-01-17 NOTE — Progress Notes (Signed)
Remote pacemaker transmission.   

## 2019-01-20 ENCOUNTER — Ambulatory Visit (INDEPENDENT_AMBULATORY_CARE_PROVIDER_SITE_OTHER): Payer: Medicare Other | Admitting: Pharmacist

## 2019-01-20 ENCOUNTER — Other Ambulatory Visit: Payer: Self-pay

## 2019-01-20 DIAGNOSIS — I4891 Unspecified atrial fibrillation: Secondary | ICD-10-CM

## 2019-01-20 DIAGNOSIS — Z7901 Long term (current) use of anticoagulants: Secondary | ICD-10-CM | POA: Diagnosis not present

## 2019-01-20 LAB — POCT INR: INR: 2.2 (ref 2.0–3.0)

## 2019-01-30 ENCOUNTER — Other Ambulatory Visit: Payer: Self-pay

## 2019-01-30 MED ORDER — WARFARIN SODIUM 5 MG PO TABS: ORAL_TABLET | ORAL | 0 refills | Status: DC

## 2019-03-03 ENCOUNTER — Ambulatory Visit: Payer: Medicare Other | Admitting: Pharmacist

## 2019-03-03 ENCOUNTER — Other Ambulatory Visit: Payer: Self-pay

## 2019-03-03 DIAGNOSIS — Z7901 Long term (current) use of anticoagulants: Secondary | ICD-10-CM

## 2019-03-03 DIAGNOSIS — I4891 Unspecified atrial fibrillation: Secondary | ICD-10-CM

## 2019-03-03 LAB — POCT INR: INR: 2.2 (ref 2.0–3.0)

## 2019-03-07 ENCOUNTER — Other Ambulatory Visit: Payer: Self-pay

## 2019-03-07 MED ORDER — WARFARIN SODIUM 5 MG PO TABS: ORAL_TABLET | ORAL | 0 refills | Status: DC

## 2019-03-13 ENCOUNTER — Other Ambulatory Visit: Payer: Self-pay

## 2019-03-13 MED ORDER — WARFARIN SODIUM 5 MG PO TABS: ORAL_TABLET | ORAL | 0 refills | Status: DC

## 2019-04-11 ENCOUNTER — Encounter: Payer: Medicare Other | Admitting: *Deleted

## 2019-04-13 DIAGNOSIS — I129 Hypertensive chronic kidney disease with stage 1 through stage 4 chronic kidney disease, or unspecified chronic kidney disease: Secondary | ICD-10-CM | POA: Diagnosis not present

## 2019-04-13 DIAGNOSIS — E039 Hypothyroidism, unspecified: Secondary | ICD-10-CM | POA: Diagnosis not present

## 2019-04-17 DIAGNOSIS — E039 Hypothyroidism, unspecified: Secondary | ICD-10-CM | POA: Diagnosis not present

## 2019-04-17 DIAGNOSIS — D72819 Decreased white blood cell count, unspecified: Secondary | ICD-10-CM | POA: Diagnosis not present

## 2019-04-17 DIAGNOSIS — C7951 Secondary malignant neoplasm of bone: Secondary | ICD-10-CM | POA: Diagnosis not present

## 2019-04-17 DIAGNOSIS — I129 Hypertensive chronic kidney disease with stage 1 through stage 4 chronic kidney disease, or unspecified chronic kidney disease: Secondary | ICD-10-CM | POA: Diagnosis not present

## 2019-04-18 ENCOUNTER — Ambulatory Visit (INDEPENDENT_AMBULATORY_CARE_PROVIDER_SITE_OTHER): Payer: Medicare Other | Admitting: *Deleted

## 2019-04-18 DIAGNOSIS — I4891 Unspecified atrial fibrillation: Secondary | ICD-10-CM

## 2019-04-18 DIAGNOSIS — I495 Sick sinus syndrome: Secondary | ICD-10-CM | POA: Diagnosis not present

## 2019-04-18 LAB — CUP PACEART REMOTE DEVICE CHECK
Battery Remaining Longevity: 46 mo
Battery Voltage: 2.98 V
Brady Statistic AP VP Percent: 0.77 %
Brady Statistic AP VS Percent: 0.16 %
Brady Statistic AS VP Percent: 40.28 %
Brady Statistic AS VS Percent: 58.79 %
Brady Statistic RA Percent Paced: 0.74 %
Brady Statistic RV Percent Paced: 41.21 %
Date Time Interrogation Session: 20201110201211
Implantable Lead Implant Date: 20160223
Implantable Lead Implant Date: 20160223
Implantable Lead Location: 753859
Implantable Lead Location: 753860
Implantable Lead Model: 5076
Implantable Lead Model: 5076
Implantable Pulse Generator Implant Date: 20160223
Lead Channel Impedance Value: 323 Ohm
Lead Channel Impedance Value: 418 Ohm
Lead Channel Impedance Value: 418 Ohm
Lead Channel Impedance Value: 551 Ohm
Lead Channel Pacing Threshold Amplitude: 0.75 V
Lead Channel Pacing Threshold Amplitude: 0.875 V
Lead Channel Pacing Threshold Pulse Width: 0.4 ms
Lead Channel Pacing Threshold Pulse Width: 0.4 ms
Lead Channel Sensing Intrinsic Amplitude: 0.875 mV
Lead Channel Sensing Intrinsic Amplitude: 0.875 mV
Lead Channel Sensing Intrinsic Amplitude: 10.75 mV
Lead Channel Sensing Intrinsic Amplitude: 10.75 mV
Lead Channel Setting Pacing Amplitude: 1.5 V
Lead Channel Setting Pacing Amplitude: 2.25 V
Lead Channel Setting Pacing Pulse Width: 0.4 ms
Lead Channel Setting Sensing Sensitivity: 0.9 mV

## 2019-04-20 ENCOUNTER — Telehealth (INDEPENDENT_AMBULATORY_CARE_PROVIDER_SITE_OTHER): Payer: Medicare Other

## 2019-04-20 DIAGNOSIS — I4891 Unspecified atrial fibrillation: Secondary | ICD-10-CM | POA: Diagnosis not present

## 2019-04-20 NOTE — Addendum Note (Signed)
Addended by: Allean Found on: 04/20/2019 10:27 AM   Modules accepted: Orders

## 2019-04-20 NOTE — Telephone Encounter (Signed)
lmom for overdue inr 

## 2019-04-20 NOTE — Telephone Encounter (Signed)
Called and scheduled pt for 245 on 11/17

## 2019-04-20 NOTE — Telephone Encounter (Signed)
Patient returned call she would like an afternoon appointment. I did not see any open slots please contact patient.

## 2019-04-25 ENCOUNTER — Ambulatory Visit (INDEPENDENT_AMBULATORY_CARE_PROVIDER_SITE_OTHER): Payer: Medicare Other | Admitting: Pharmacist Clinician (PhC)/ Clinical Pharmacy Specialist

## 2019-04-25 ENCOUNTER — Other Ambulatory Visit: Payer: Self-pay

## 2019-04-25 DIAGNOSIS — I4891 Unspecified atrial fibrillation: Secondary | ICD-10-CM | POA: Diagnosis not present

## 2019-04-25 DIAGNOSIS — Z7901 Long term (current) use of anticoagulants: Secondary | ICD-10-CM

## 2019-04-25 LAB — POCT INR: INR: 3.6 — AB (ref 2.0–3.0)

## 2019-04-25 NOTE — Patient Instructions (Signed)
No warfarin today Tuesday Nov 17, then continue with 1 tablet each Monday, Wednesday and Friday, 1/2 tablet all other days.  Repeat INR in 3 weeks.

## 2019-05-01 ENCOUNTER — Telehealth: Payer: Self-pay | Admitting: Hematology & Oncology

## 2019-05-01 NOTE — Telephone Encounter (Signed)
lmom to inform patient of appt 12/11 at 12 pm per 11/23 sch msg

## 2019-05-08 LAB — PROTIME-INR

## 2019-05-08 NOTE — Progress Notes (Signed)
Remote pacemaker transmission.   

## 2019-05-15 ENCOUNTER — Encounter: Payer: Self-pay | Admitting: Cardiovascular Disease

## 2019-05-15 ENCOUNTER — Other Ambulatory Visit: Payer: Self-pay

## 2019-05-15 ENCOUNTER — Ambulatory Visit (INDEPENDENT_AMBULATORY_CARE_PROVIDER_SITE_OTHER): Payer: Medicare Other | Admitting: Cardiovascular Disease

## 2019-05-15 ENCOUNTER — Ambulatory Visit (INDEPENDENT_AMBULATORY_CARE_PROVIDER_SITE_OTHER): Payer: Medicare Other | Admitting: Pharmacist

## 2019-05-15 VITALS — BP 118/62 | HR 73 | Temp 97.3°F | Ht 66.0 in | Wt 151.4 lb

## 2019-05-15 DIAGNOSIS — C50919 Malignant neoplasm of unspecified site of unspecified female breast: Secondary | ICD-10-CM

## 2019-05-15 DIAGNOSIS — Z7901 Long term (current) use of anticoagulants: Secondary | ICD-10-CM

## 2019-05-15 DIAGNOSIS — C7951 Secondary malignant neoplasm of bone: Secondary | ICD-10-CM

## 2019-05-15 DIAGNOSIS — I495 Sick sinus syndrome: Secondary | ICD-10-CM | POA: Diagnosis not present

## 2019-05-15 DIAGNOSIS — Z950 Presence of cardiac pacemaker: Secondary | ICD-10-CM

## 2019-05-15 DIAGNOSIS — I4819 Other persistent atrial fibrillation: Secondary | ICD-10-CM | POA: Diagnosis not present

## 2019-05-15 DIAGNOSIS — I4891 Unspecified atrial fibrillation: Secondary | ICD-10-CM

## 2019-05-15 LAB — POCT INR: INR: 2.8 (ref 2.0–3.0)

## 2019-05-15 NOTE — Patient Instructions (Signed)
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
## 2019-05-19 ENCOUNTER — Other Ambulatory Visit: Payer: Self-pay

## 2019-05-19 ENCOUNTER — Inpatient Hospital Stay (HOSPITAL_BASED_OUTPATIENT_CLINIC_OR_DEPARTMENT_OTHER): Payer: Medicare Other | Admitting: Hematology & Oncology

## 2019-05-19 ENCOUNTER — Inpatient Hospital Stay: Payer: Medicare Other | Attending: Hematology

## 2019-05-19 ENCOUNTER — Encounter: Payer: Self-pay | Admitting: Hematology & Oncology

## 2019-05-19 VITALS — BP 131/58 | HR 87 | Temp 97.7°F | Resp 18 | Wt 151.2 lb

## 2019-05-19 DIAGNOSIS — C50911 Malignant neoplasm of unspecified site of right female breast: Secondary | ICD-10-CM | POA: Diagnosis not present

## 2019-05-19 DIAGNOSIS — Z79899 Other long term (current) drug therapy: Secondary | ICD-10-CM | POA: Diagnosis not present

## 2019-05-19 DIAGNOSIS — Z170 Estrogen receptor positive status [ER+]: Secondary | ICD-10-CM | POA: Insufficient documentation

## 2019-05-19 DIAGNOSIS — C50919 Malignant neoplasm of unspecified site of unspecified female breast: Secondary | ICD-10-CM

## 2019-05-19 DIAGNOSIS — Z7901 Long term (current) use of anticoagulants: Secondary | ICD-10-CM | POA: Diagnosis not present

## 2019-05-19 DIAGNOSIS — C7951 Secondary malignant neoplasm of bone: Secondary | ICD-10-CM | POA: Diagnosis not present

## 2019-05-19 DIAGNOSIS — Z79811 Long term (current) use of aromatase inhibitors: Secondary | ICD-10-CM | POA: Insufficient documentation

## 2019-05-19 DIAGNOSIS — M549 Dorsalgia, unspecified: Secondary | ICD-10-CM | POA: Insufficient documentation

## 2019-05-19 DIAGNOSIS — I509 Heart failure, unspecified: Secondary | ICD-10-CM | POA: Diagnosis not present

## 2019-05-19 LAB — CMP (CANCER CENTER ONLY)
ALT: 13 U/L (ref 0–44)
AST: 23 U/L (ref 15–41)
Albumin: 3.9 g/dL (ref 3.5–5.0)
Alkaline Phosphatase: 106 U/L (ref 38–126)
Anion gap: 6 (ref 5–15)
BUN: 18 mg/dL (ref 8–23)
CO2: 31 mmol/L (ref 22–32)
Calcium: 9.9 mg/dL (ref 8.9–10.3)
Chloride: 106 mmol/L (ref 98–111)
Creatinine: 0.82 mg/dL (ref 0.44–1.00)
GFR, Est AFR Am: >60 mL/min (ref >60)
GFR, Estimated: >60 mL/min (ref >60)
Glucose, Bld: 106 mg/dL — ABNORMAL HIGH (ref 70–99)
Potassium: 3.8 mmol/L (ref 3.5–5.1)
Sodium: 143 mmol/L (ref 135–145)
Total Bilirubin: 1.9 mg/dL — ABNORMAL HIGH (ref 0.3–1.2)
Total Protein: 7 g/dL (ref 6.5–8.1)

## 2019-05-19 LAB — CBC WITH DIFFERENTIAL (CANCER CENTER ONLY)
Abs Immature Granulocytes: 0.02 10*3/uL (ref 0.00–0.07)
Basophils Absolute: 0 10*3/uL (ref 0.0–0.1)
Basophils Relative: 1 %
Eosinophils Absolute: 0.2 10*3/uL (ref 0.0–0.5)
Eosinophils Relative: 6 %
HCT: 35.5 % — ABNORMAL LOW (ref 36.0–46.0)
Hemoglobin: 11.6 g/dL — ABNORMAL LOW (ref 12.0–15.0)
Immature Granulocytes: 1 %
Lymphocytes Relative: 30 %
Lymphs Abs: 1 10*3/uL (ref 0.7–4.0)
MCH: 29.6 pg (ref 26.0–34.0)
MCHC: 32.7 g/dL (ref 30.0–36.0)
MCV: 90.6 fL (ref 80.0–100.0)
Monocytes Absolute: 0.4 10*3/uL (ref 0.1–1.0)
Monocytes Relative: 11 %
Neutro Abs: 1.7 10*3/uL (ref 1.7–7.7)
Neutrophils Relative %: 51 %
Platelet Count: 142 10*3/uL — ABNORMAL LOW (ref 150–400)
RBC: 3.92 MIL/uL (ref 3.87–5.11)
RDW: 14.7 % (ref 11.5–15.5)
WBC Count: 3.4 10*3/uL — ABNORMAL LOW (ref 4.0–10.5)
nRBC: 0 % (ref 0.0–0.2)

## 2019-05-19 NOTE — Progress Notes (Signed)
Hematology and Oncology Follow Up Visit  Amy White [id number] Oct 16, 1925 85 y.o. 05/19/2019   Principle Diagnosis:   Metastatic breast cancer-bone only metastasis  Current Therapy:    Aromasin 25 mg p.o. Daily -- d/c on 06/15/2018  Ribociclib 200 mg po q day (21/14) -- changed on 03/27/2018 -- patient stopped on her own in 04/2018  Faslodex 500 mg IM q month - start 07/25/2018  Ibrance 75 mg q day  (14on/14off)-- start on 07/28/2018  Xgeva 120 mg sq q 3 months - on hold permanently due to bad teeth.      Interim History:  Amy White is back for follow-up.  Unfortunately, it has been 6 months since we last saw her.  I am not sure what the issues are but apparently there have been some billing issues.  She has not taken any Faslodex since we last saw her.  Apparently, she has had that she was charged $5000 for this.  I talked to our insurance specialist about this.  Hopefully, she will be able to figure out what happened.  She was on Ibrance.  She says she stopped this 2 months ago.  Her heart seems to be doing about the same.  I know she has some issues with congestive heart failure.  I think she saw her cardiologist recently.  She has had no issues with bony pain.  There is no issues with her bowels or bladder.  She has had no nausea or vomiting.  She had a very quiet Thanksgiving.  She has been very cautious because of the coronavirus.    Her last CA 27.29 back in June was stable at 69.  Overall, her performance status is ECOG 2.   Medications:  Current Outpatient Medications:  .  acetaminophen (TYLENOL) 500 MG tablet, Take 500 mg by mouth every 6 (six) hours as needed for fever., Disp: , Rfl:  .  albuterol (PROVENTIL HFA;VENTOLIN HFA) 108 (90 BASE) MCG/ACT inhaler, Inhale 2 puffs into the lungs every 6 (six) hours as needed for wheezing or shortness of breath., Disp: , Rfl:  .  ALPRAZolam (XANAX) 0.5 MG tablet, Take 0.25 mg by mouth 2 (two) times daily as needed for  anxiety. , Disp: , Rfl: 0 .  bisoprolol (ZEBETA) 5 MG tablet, Take 2 tablets (10 mg total) by mouth daily., Disp: 180 tablet, Rfl: 1 .  cromolyn (OPTICROM) 4 % ophthalmic solution, INT 1 GTT IN OU QID, Disp: , Rfl:  .  feeding supplement, ENSURE ENLIVE, (ENSURE ENLIVE) LIQD, Take 237 mLs by mouth 2 (two) times daily between meals., Disp: 237 mL, Rfl: 12 .  furosemide (LASIX) 40 MG tablet, Take 20 mg by mouth daily as needed. , Disp: , Rfl:  .  levothyroxine (SYNTHROID, LEVOTHROID) 88 MCG tablet, Take 88 mcg by mouth daily., Disp: , Rfl:  .  oxyCODONE-acetaminophen (PERCOCET/ROXICET) 5-325 MG tablet, Take 1 tablet by mouth every 4 (four) hours as needed for severe pain (For pain.). , Disp: , Rfl:  .  tetrahydrozoline-zinc (VISINE-AC) 0.05-0.25 % ophthalmic solution, Place 2 drops into both eyes 3 (three) times daily as needed (red eyes)., Disp: , Rfl:  .  Vitamin D, Ergocalciferol, (DRISDOL) 1.25 MG (50000 UT) CAPS capsule, TAKE 1 CAPSULE BY MOUTH 1 TIME A WEEK, Disp: 12 capsule, Rfl: 3 .  warfarin (COUMADIN) 5 MG tablet, TAKE ONE-HALF TO 1 TABLET BY MOUTH DAILY AS DIRECTED BY COUMADIN CLINIC, Disp: 90 tablet, Rfl: 0 .  diphenoxylate-atropine (LOMOTIL) 2.5-0.025 MG tablet, TAKE  1 TABLET BY MOUTH AFTER EACH LOOSE STOOL, MAXIMUM DAILY DOSE IS 8 TABLETS (Patient not taking: Reported on 05/15/2019), Disp: 100 tablet, Rfl: 0 .  palbociclib (IBRANCE) 75 MG capsule, Take 1 capsule (75 mg total) by mouth daily with breakfast. Take whole with food. Take for 14 days on, 14 days off, repeat every 28 days. (Patient not taking: Reported on 05/15/2019), Disp: 14 capsule, Rfl: 5 .  polyethylene glycol (MIRALAX / GLYCOLAX) packet, Take 17 g by mouth daily as needed for mild constipation. (Patient not taking: Reported on 05/15/2019), Disp: 14 each, Rfl: 0 .  potassium chloride SA (K-DUR,KLOR-CON) 10 MEQ tablet, Take 2 tablets (20 mEq total) by mouth daily. (Patient not taking: Reported on 05/15/2019), Disp: 60 tablet, Rfl:  6 .  UNABLE TO FIND, Hair prosthesis (Patient not taking: Reported on 05/15/2019), Disp: 1 each, Rfl: 0  Allergies:  Allergies  Allergen Reactions  . Morphine And Related Rash  . Crestor [Rosuvastatin] Other (See Comments)    Reaction unknown  . Sulfa Antibiotics Other (See Comments)    Mouth breaks out in sores  . Ciprofloxacin Rash and Other (See Comments)    Rash in the mouth  . Penicillins Hives, Rash and Other (See Comments)    Has patient had a PCN reaction causing immediate rash, facial/tongue/throat swelling, SOB or lightheadedness with hypotension: yes Has patient had a PCN reaction causing severe rash involving mucus membranes or skin necrosis: no Has patient had a PCN reaction that required hospitalization: no Has patient had a PCN reaction occurring within the last 10 years: yes If all of the above answers are "NO", then may proceed with Cephalosporin use.   . Vancomycin Rash and Other (See Comments)    "Red-man"    Past Medical History, Surgical history, Social history, and Family History were reviewed and updated.  Review of Systems: Review of Systems  Constitutional: Negative.   HENT:  Negative.   Eyes: Negative.   Respiratory: Negative.   Cardiovascular: Negative.   Gastrointestinal: Negative.   Endocrine: Negative.   Genitourinary: Negative.    Musculoskeletal: Positive for back pain and myalgias.  Skin: Negative.   Neurological: Negative.   Hematological: Negative.   Psychiatric/Behavioral: Negative.     Physical Exam:  weight is 151 lb 4 oz (68.6 kg). Her temporal temperature is 97.7 F (36.5 C). Her blood pressure is 131/58 (abnormal) and her pulse is 87. Her respiration is 18 and oxygen saturation is 94%.   Wt Readings from Last 3 Encounters:  05/19/19 151 lb 4 oz (68.6 kg)  05/15/19 151 lb 6.4 oz (68.7 kg)  11/17/18 147 lb (66.7 kg)    Physical Exam Vitals reviewed.  HENT:     Head: Normocephalic and atraumatic.  Eyes:     Pupils: Pupils  are equal, round, and reactive to light.  Cardiovascular:     Rate and Rhythm: Normal rate and regular rhythm.     Heart sounds: Normal heart sounds.     Comments: Cardiac exam is irregular rate and irregular rhythm consistent with atrial fibrillation.  The heart shows no obvious murmurs or rubs. Pulmonary:     Effort: Pulmonary effort is normal.     Breath sounds: Normal breath sounds.  Abdominal:     General: Bowel sounds are normal.     Palpations: Abdomen is soft.  Musculoskeletal:        General: No tenderness or deformity. Normal range of motion.     Cervical back: Normal range of motion.  Lymphadenopathy:     Cervical: No cervical adenopathy.  Skin:    General: Skin is warm and dry.     Findings: No erythema or rash.  Neurological:     Mental Status: She is alert and oriented to person, place, and time.  Psychiatric:        Behavior: Behavior normal.        Thought Content: Thought content normal.        Judgment: Judgment normal.      Lab Results  Component Value Date   WBC 3.4 (L) 05/19/2019   HGB 11.6 (L) 05/19/2019   HCT 35.5 (L) 05/19/2019   MCV 90.6 05/19/2019   PLT 142 (L) 05/19/2019     Chemistry      Component Value Date/Time   NA 143 05/19/2019 1200   NA 142 03/22/2018 1638   NA 146 (H) 01/25/2017 1448   NA 142 07/31/2015 1203   K 3.8 05/19/2019 1200   K 4.0 01/25/2017 1448   K 5.1 07/31/2015 1203   CL 106 05/19/2019 1200   CL 108 01/25/2017 1448   CO2 31 05/19/2019 1200   CO2 29 01/25/2017 1448   CO2 23 07/31/2015 1203   BUN 18 05/19/2019 1200   BUN 13 03/22/2018 1638   BUN 18 01/25/2017 1448   BUN 23.3 07/31/2015 1203   CREATININE 0.82 05/19/2019 1200   CREATININE 1.2 01/25/2017 1448   CREATININE 1.1 07/31/2015 1203      Component Value Date/Time   CALCIUM 9.9 05/19/2019 1200   CALCIUM 9.3 01/25/2017 1448   CALCIUM 9.3 07/31/2015 1203   ALKPHOS 106 05/19/2019 1200   ALKPHOS 64 01/25/2017 1448   ALKPHOS 57 07/31/2015 1203   AST 23  05/19/2019 1200   AST 18 07/31/2015 1203   ALT 13 05/19/2019 1200   ALT 20 01/25/2017 1448   ALT 15 07/31/2015 1203   BILITOT 1.9 (H) 05/19/2019 1200   BILITOT 0.96 07/31/2015 1203      Impression and Plan: Ms. Liptak is a 85 year old white female.  She has metastatic breast cancer.  It seems as if her disease is still confined to the bones.  Again, she would not take any treatment because of the billing.  I feel bad for her.  Hopefully, her insurance expert will be able to sort this out.  I would have to think that her cancer is stable at least.    The CA 27.29 will give [place] an idea.  Hopefully, we will not have to do any scans.  I will plan to see her back in about 3 months.  We will try to get her through the wintertime.   Volanda Napoleon, MD 12/11/202012:47 PM

## 2019-05-20 LAB — CANCER ANTIGEN 27.29: CA 27.29: 47 U/mL — ABNORMAL HIGH (ref 0.0–38.6)

## 2019-05-22 ENCOUNTER — Telehealth: Payer: Self-pay | Admitting: *Deleted

## 2019-05-22 ENCOUNTER — Telehealth: Payer: Self-pay | Admitting: Hematology & Oncology

## 2019-05-22 NOTE — Telephone Encounter (Signed)
As noted below by Dr. Marin Olp, I informed her that the tumor marker is down to 47. She verbalized understanding.

## 2019-05-22 NOTE — Telephone Encounter (Signed)
-----   Message from Volanda Napoleon, MD sent at 05/22/2019  7:14 AM EST ----- Call - the tumor marker is actually a little better!!!  God is great!!  Film/video editor

## 2019-05-22 NOTE — Telephone Encounter (Signed)
Appointments scheduled per 12/11 los letter/calendar was mailed

## 2019-05-24 IMAGING — MR MR LUMBAR SPINE WO/W CM
7 of 16 series · 21 of 48 positions shown · IV contrast (multihance)
Comparison: Bone scan July 03, 2016 and chest radiograph
February 21, 2016

CLINICAL DATA: Worsening back pain after fall July 22, 2017.
History of metastatic breast cancer.

EXAM:
MRI THORACIC AND LUMBAR SPINE WITHOUT AND WITH CONTRAST
TECHNIQUE: Multiplanar and multiecho pulse sequences of the thoracic and lumbar
spine were obtained without and with intravenous contrast.
CONTRAST:  15mL MULTIHANCE GADOBENATE DIMEGLUMINE 529 MG/ML IV SOLN

[Series 6: T2 · sagittal · 3.0mm · 0.62mm/px · 2 of 13 slices shown (1 of 4)]
[im 1/13]
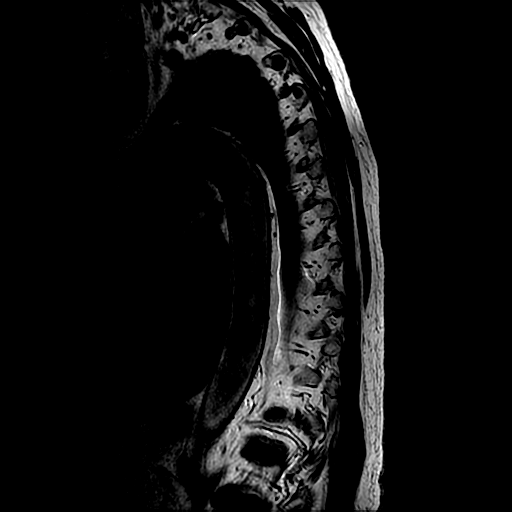
[im 13/13]
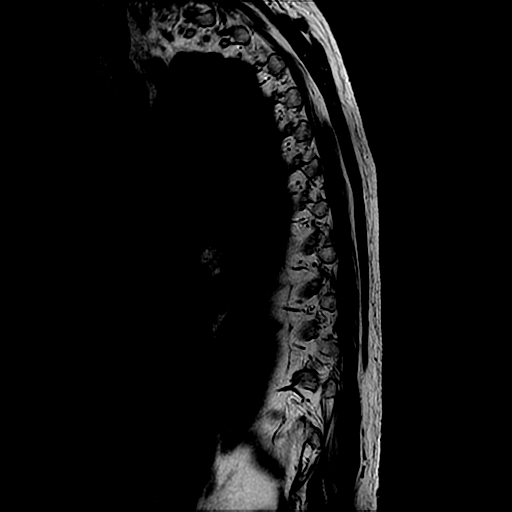

[Series 7: T1 · sagittal · 3.0mm · 0.62mm/px · 2 of 13 slices shown (1 of 3)]
[im 1/13]
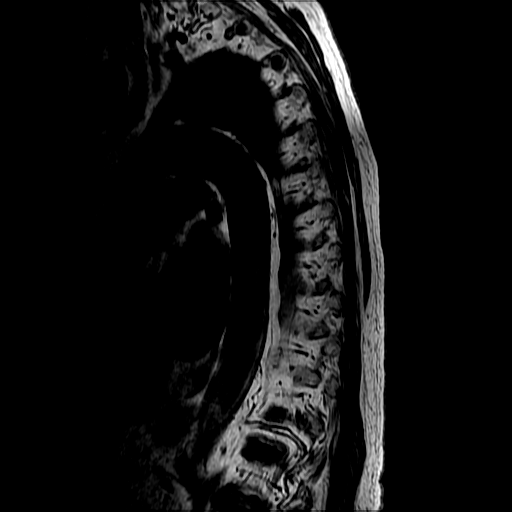
[im 13/13]
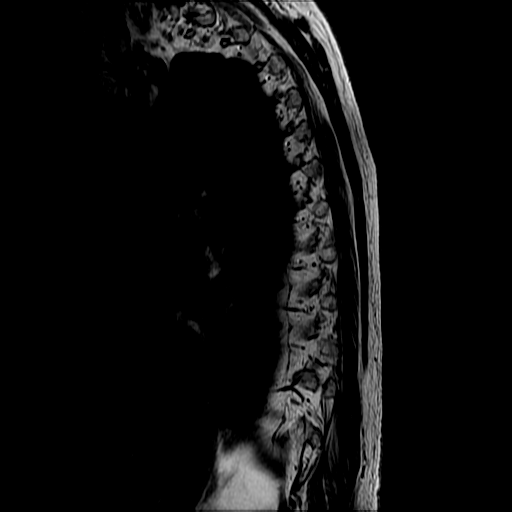

[Series 10: T2 · axial · 4.0mm · 0.43mm/px · z∈[-241,-58]mm · 4 of 40 slices shown (2 of 4)]
[im 1/40]
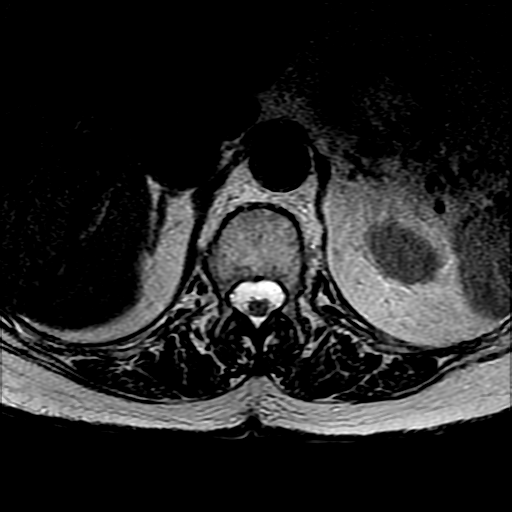
[im 14/40]
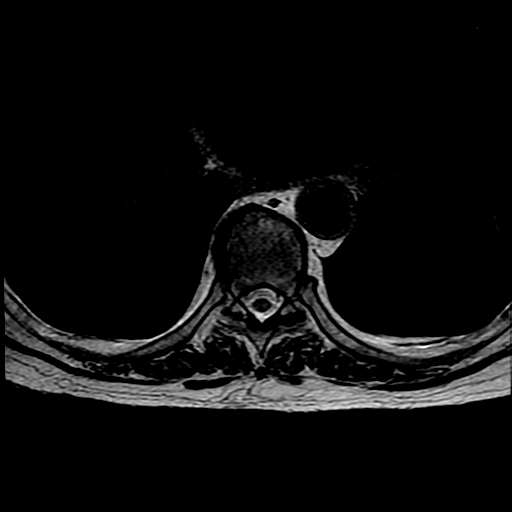
[im 27/40]
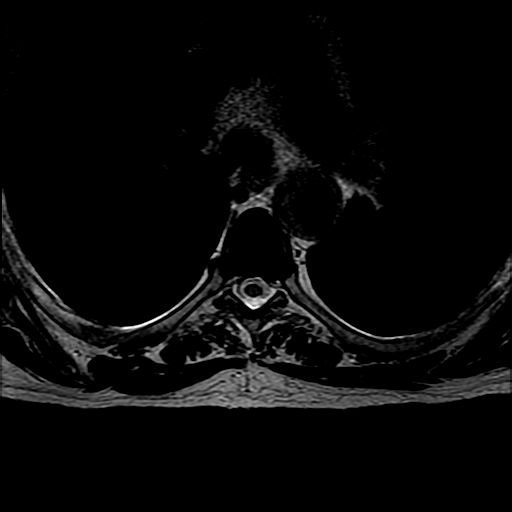
[im 40/40]
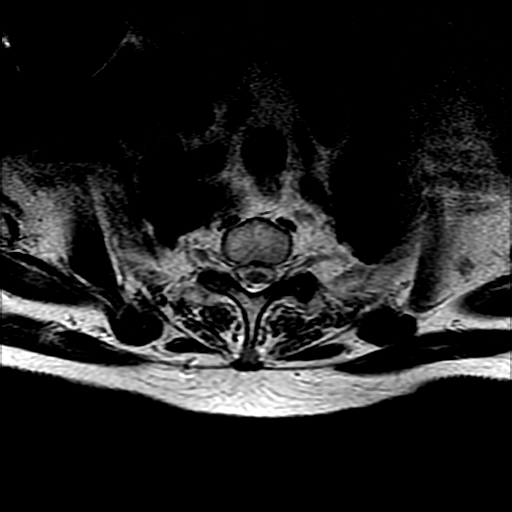

[Series 11: T1 · axial · non-contrast · 4.0mm · 0.86mm/px · z∈[-263,+12]mm · 6 of 56 slices shown (2 of 3)]
[im 1/56]
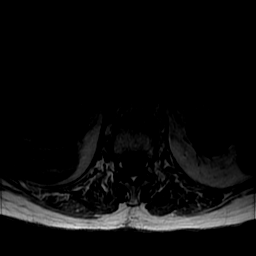
[im 12/56]
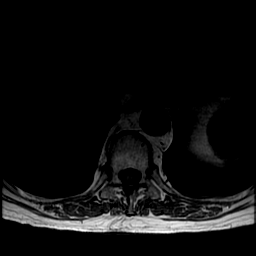
[im 23/56]
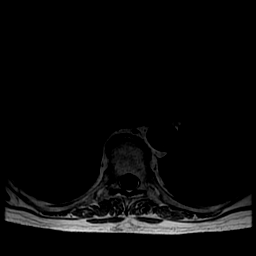
[im 34/56]
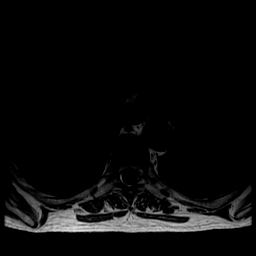
[im 45/56]
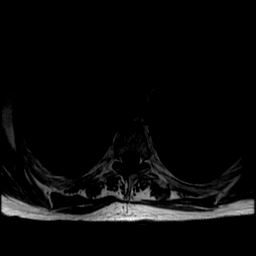
[im 56/56]
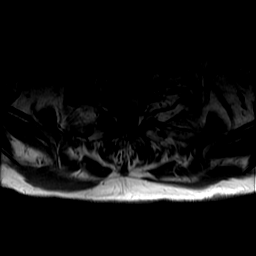

[Series 14: T2 · sagittal · 4.0mm · 0.55mm/px · 1 of 13 slices shown (3 of 4)]
[im 1/13]
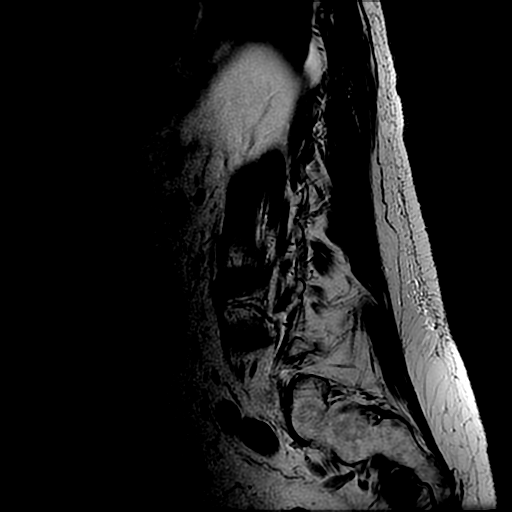

[Series 15: T1 · sagittal · 4.0mm · 0.55mm/px · 2 of 14 slices shown (3 of 3)]
[im 1/14]
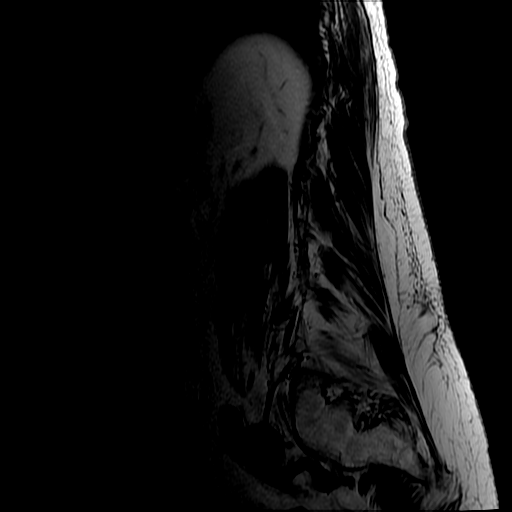
[im 14/14]
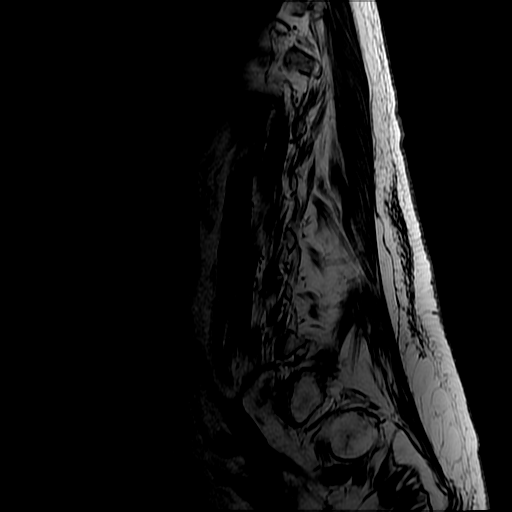

[Series 17: T2 · axial · 4.0mm · 0.39mm/px · z∈[-419,-228]mm · 4 of 40 slices shown (4 of 4)]
[im 1/40]
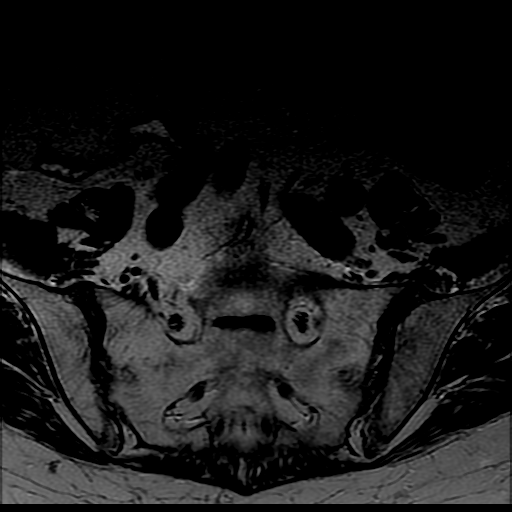
[im 14/40]
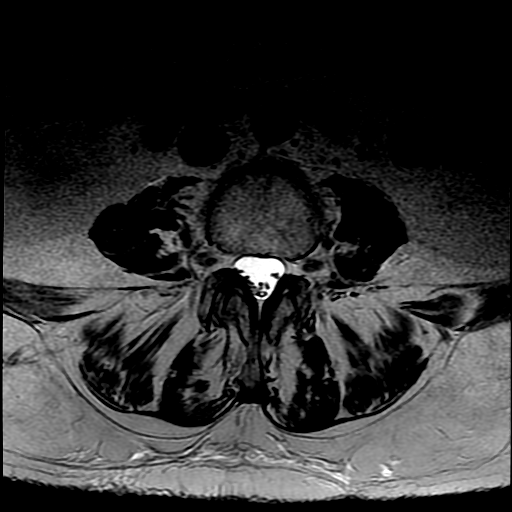
[im 27/40]
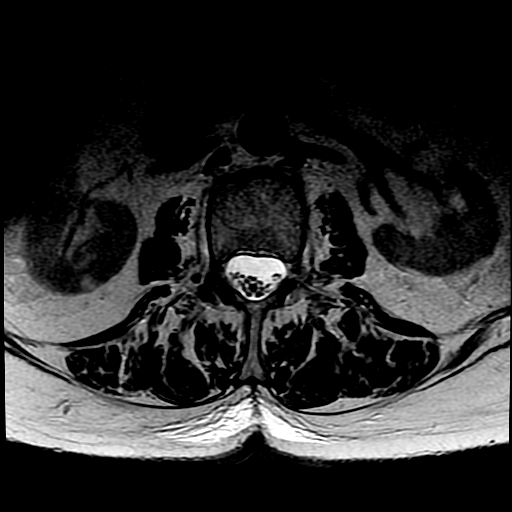
[im 40/40]
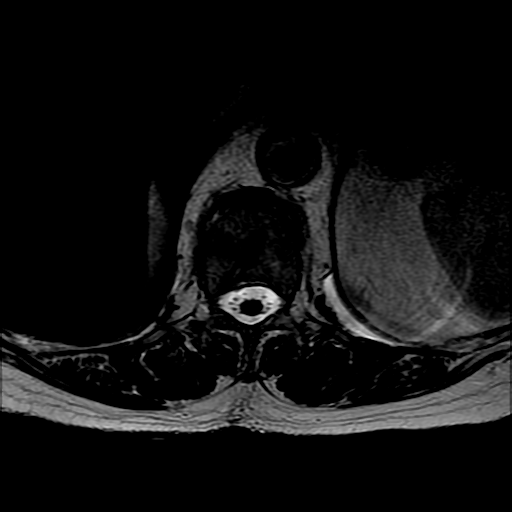

[21 of 48 positions shown; findings below may reference images not displayed]

FINDINGS: MRI THORACIC SPINE FINDINGS

ALIGNMENT: Maintenance of the thoracic kyphosis. No malalignment.

VERTEBRAE/DISCS: Old severe T12 burst fracture with superimposed
linear bright STIR signal and enhancement along the superior
endplate with further height loss. Old retropulsed bony fragments.
The remaining thoracic vertebral are intact. Mild T8-9 disc height
loss with multilevel mild disc desiccation and minimal chronic
discogenic endplate changes. Bright STIR signal and abnormal
enhancement posterior elements T6.

CORD: Thoracic spinal cord is normal morphology and signal
characteristics. No abnormal cord, leptomeningeal or epidural
enhancement.

PREVERTEBRAL AND PARASPINAL SOFT TISSUES: Minimal paraspinal edema
and enhancement about the spinous process of T6.

DISC LEVELS:

Retropulsed bony fragments resulting in mild canal stenosis at
T11-12. No disc bulge or canal stenosis at remaining levels. No
significant neural foraminal narrowing.

MRI LUMBAR SPINE FINDINGS

SEGMENTATION: For the purposes of this report, the last well-formed
intervertebral disc is reported as L5-S1.

ALIGNMENT: Maintained lumbar lordosis. No malalignment.

VERTEBRAE: Mild acute L2 superior endplate compression fracture with
less than 25% superior endplate height loss, hazy STIR signal and
enhancement. Mild old L3 and L4 superior endplate compression
fractures with old Schmorl's nodes. 10 mm enhancement S2 vertebral
body and subcentimeter focal been focus of enhancement LEFT sacrum.
3.9 cm low T1, high heterogeneous enhancement RIGHT iliac bone.
Generalized bright T1 bone marrow signal compatible with osteopenia.

CONUS MEDULLARIS AND CAUDA EQUINA: Conus medullaris terminates at L2
and demonstrates normal morphology and signal characteristics. Cauda
equina is normal. No abnormal cord, leptomeningeal or epidural
enhancement.

PARASPINAL AND OTHER SOFT TISSUES: Non suspicious. Moderate
paraspinal muscle atrophy.

DISC LEVELS:

T12-L1: Small broad-based disc osteophyte complex, mild facet
arthropathy and ligamentum flavum redundancy without canal stenosis
or neural foraminal narrowing.

L2-3: Moderate broad-based disc bulge and small LEFT central disc
protrusion. Mild facet arthropathy and ligamentum flavum redundancy.
Mild canal stenosis. No neural foraminal narrowing.

L2-3: Small to moderate broad-based disc bulge asymmetric to LEFT,
mild facet arthropathy and ligamentum flavum redundancy without
canal stenosis. Enhancing annular fissure. No neural foraminal
narrowing.

L3-4: Small to moderate broad-based disc bulge, mild facet
arthropathy and ligamentum flavum redundancy. Mild canal stenosis.
Minimal RIGHT neural foraminal narrowing. Enhancing annular fissure.

L4-5: Annular bulging asymmetric to the RIGHT, mild facet
arthropathy and ligamentum flavum redundancy without canal stenosis
or neural foraminal narrowing.

L5-S1: Small broad-based disc bulge. Severe RIGHT, moderate LEFT
facet arthropathy. No canal stenosis. Minimal LEFT neural foraminal
narrowing.
IMPRESSION: MRI thoracic spine:

1. Acute on chronic severe T12 nonpathologic burst fracture, minimal
progressed height loss. No malalignment.
2. T6 posterior elements metastasis.
3. Mild canal stenosis T11-12.

MRI lumbar spine:

1. Acute mild L2 non pathologic compression fracture. Mild old L3
and L4 compression fractures. No malalignment.
2. Sacroiliac metastasis.
3. Degenerative change of the lumbar spine resulting in mild canal
stenosis L1-2 and L3-4.
4. Minimal L3-4 and L5-S1 neural foraminal narrowing.

## 2019-06-12 ENCOUNTER — Other Ambulatory Visit: Payer: Self-pay

## 2019-06-12 ENCOUNTER — Ambulatory Visit (INDEPENDENT_AMBULATORY_CARE_PROVIDER_SITE_OTHER): Payer: Medicare Other | Admitting: Pharmacist Clinician (PhC)/ Clinical Pharmacy Specialist

## 2019-06-12 DIAGNOSIS — Z7901 Long term (current) use of anticoagulants: Secondary | ICD-10-CM

## 2019-06-12 DIAGNOSIS — I4891 Unspecified atrial fibrillation: Secondary | ICD-10-CM

## 2019-06-12 LAB — POCT INR: INR: 1.7 — AB (ref 2.0–3.0)

## 2019-06-12 NOTE — Patient Instructions (Signed)
Take 1.5 tablets today Monday Jan 4, then continue with 1 tablet each Monday, Wednesday and Friday, 1/2 tablet all other days.  Repeat INR in 4 weeks

## 2019-07-10 ENCOUNTER — Ambulatory Visit (INDEPENDENT_AMBULATORY_CARE_PROVIDER_SITE_OTHER): Payer: Medicare Other | Admitting: Pharmacist Clinician (PhC)/ Clinical Pharmacy Specialist

## 2019-07-10 ENCOUNTER — Other Ambulatory Visit: Payer: Self-pay

## 2019-07-10 DIAGNOSIS — I4891 Unspecified atrial fibrillation: Secondary | ICD-10-CM

## 2019-07-10 DIAGNOSIS — Z7901 Long term (current) use of anticoagulants: Secondary | ICD-10-CM | POA: Diagnosis not present

## 2019-07-10 LAB — POCT INR: INR: 2.9 (ref 2.0–3.0)

## 2019-07-18 ENCOUNTER — Ambulatory Visit (INDEPENDENT_AMBULATORY_CARE_PROVIDER_SITE_OTHER): Payer: Medicare Other | Admitting: *Deleted

## 2019-07-18 DIAGNOSIS — I495 Sick sinus syndrome: Secondary | ICD-10-CM | POA: Diagnosis not present

## 2019-07-18 LAB — CUP PACEART REMOTE DEVICE CHECK
Battery Remaining Longevity: 47 mo
Battery Voltage: 2.98 V
Brady Statistic AP VP Percent: 0 %
Brady Statistic AP VS Percent: 0 %
Brady Statistic AS VP Percent: 41.11 %
Brady Statistic AS VS Percent: 58.89 %
Brady Statistic RA Percent Paced: 0 %
Brady Statistic RV Percent Paced: 43.86 %
Date Time Interrogation Session: 20210209105553
Implantable Lead Implant Date: 20160223
Implantable Lead Implant Date: 20160223
Implantable Lead Location: 753859
Implantable Lead Location: 753860
Implantable Lead Model: 5076
Implantable Lead Model: 5076
Implantable Pulse Generator Implant Date: 20160223
Lead Channel Impedance Value: 323 Ohm
Lead Channel Impedance Value: 418 Ohm
Lead Channel Impedance Value: 418 Ohm
Lead Channel Impedance Value: 551 Ohm
Lead Channel Pacing Threshold Amplitude: 0.75 V
Lead Channel Pacing Threshold Amplitude: 0.875 V
Lead Channel Pacing Threshold Pulse Width: 0.4 ms
Lead Channel Pacing Threshold Pulse Width: 0.4 ms
Lead Channel Sensing Intrinsic Amplitude: 0.75 mV
Lead Channel Sensing Intrinsic Amplitude: 0.75 mV
Lead Channel Sensing Intrinsic Amplitude: 11.125 mV
Lead Channel Sensing Intrinsic Amplitude: 11.125 mV
Lead Channel Setting Pacing Amplitude: 2.25 V
Lead Channel Setting Pacing Pulse Width: 0.4 ms
Lead Channel Setting Sensing Sensitivity: 0.9 mV

## 2019-07-19 NOTE — Progress Notes (Signed)
PPM Remote  

## 2019-08-18 ENCOUNTER — Other Ambulatory Visit: Payer: Self-pay

## 2019-08-18 ENCOUNTER — Encounter (INDEPENDENT_AMBULATORY_CARE_PROVIDER_SITE_OTHER): Payer: Self-pay

## 2019-08-18 ENCOUNTER — Ambulatory Visit (INDEPENDENT_AMBULATORY_CARE_PROVIDER_SITE_OTHER): Payer: Medicare Other | Admitting: Pharmacist

## 2019-08-18 DIAGNOSIS — I4891 Unspecified atrial fibrillation: Secondary | ICD-10-CM | POA: Diagnosis not present

## 2019-08-18 DIAGNOSIS — Z7901 Long term (current) use of anticoagulants: Secondary | ICD-10-CM | POA: Diagnosis not present

## 2019-08-18 LAB — POCT INR: INR: 3.4 — AB (ref 2.0–3.0)

## 2019-08-21 ENCOUNTER — Inpatient Hospital Stay: Payer: Medicare Other | Attending: Hematology

## 2019-08-21 ENCOUNTER — Encounter: Payer: Self-pay | Admitting: Hematology & Oncology

## 2019-08-21 ENCOUNTER — Other Ambulatory Visit: Payer: Self-pay

## 2019-08-21 ENCOUNTER — Inpatient Hospital Stay (HOSPITAL_BASED_OUTPATIENT_CLINIC_OR_DEPARTMENT_OTHER): Payer: Medicare Other | Admitting: Hematology & Oncology

## 2019-08-21 VITALS — BP 119/69 | HR 86 | Temp 97.1°F | Resp 20 | Wt 146.0 lb

## 2019-08-21 DIAGNOSIS — M1990 Unspecified osteoarthritis, unspecified site: Secondary | ICD-10-CM | POA: Insufficient documentation

## 2019-08-21 DIAGNOSIS — Z7901 Long term (current) use of anticoagulants: Secondary | ICD-10-CM | POA: Insufficient documentation

## 2019-08-21 DIAGNOSIS — C7951 Secondary malignant neoplasm of bone: Secondary | ICD-10-CM | POA: Diagnosis not present

## 2019-08-21 DIAGNOSIS — I2510 Atherosclerotic heart disease of native coronary artery without angina pectoris: Secondary | ICD-10-CM | POA: Diagnosis not present

## 2019-08-21 DIAGNOSIS — C50911 Malignant neoplasm of unspecified site of right female breast: Secondary | ICD-10-CM | POA: Insufficient documentation

## 2019-08-21 DIAGNOSIS — Z170 Estrogen receptor positive status [ER+]: Secondary | ICD-10-CM | POA: Insufficient documentation

## 2019-08-21 DIAGNOSIS — Z79811 Long term (current) use of aromatase inhibitors: Secondary | ICD-10-CM | POA: Diagnosis not present

## 2019-08-21 DIAGNOSIS — Z79899 Other long term (current) drug therapy: Secondary | ICD-10-CM | POA: Insufficient documentation

## 2019-08-21 DIAGNOSIS — C50912 Malignant neoplasm of unspecified site of left female breast: Secondary | ICD-10-CM

## 2019-08-21 LAB — CBC WITH DIFFERENTIAL (CANCER CENTER ONLY)
Abs Immature Granulocytes: 0.02 10*3/uL (ref 0.00–0.07)
Basophils Absolute: 0.1 10*3/uL (ref 0.0–0.1)
Basophils Relative: 1 %
Eosinophils Absolute: 0.2 10*3/uL (ref 0.0–0.5)
Eosinophils Relative: 5 %
HCT: 35.2 % — ABNORMAL LOW (ref 36.0–46.0)
Hemoglobin: 11.5 g/dL — ABNORMAL LOW (ref 12.0–15.0)
Immature Granulocytes: 0 %
Lymphocytes Relative: 28 %
Lymphs Abs: 1.2 10*3/uL (ref 0.7–4.0)
MCH: 29.7 pg (ref 26.0–34.0)
MCHC: 32.7 g/dL (ref 30.0–36.0)
MCV: 91 fL (ref 80.0–100.0)
Monocytes Absolute: 0.4 10*3/uL (ref 0.1–1.0)
Monocytes Relative: 9 %
Neutro Abs: 2.6 10*3/uL (ref 1.7–7.7)
Neutrophils Relative %: 57 %
Platelet Count: 160 10*3/uL (ref 150–400)
RBC: 3.87 MIL/uL (ref 3.87–5.11)
RDW: 14.6 % (ref 11.5–15.5)
WBC Count: 4.5 10*3/uL (ref 4.0–10.5)
nRBC: 0 % (ref 0.0–0.2)

## 2019-08-21 LAB — CMP (CANCER CENTER ONLY)
ALT: 11 U/L (ref 0–44)
AST: 21 U/L (ref 15–41)
Albumin: 3.7 g/dL (ref 3.5–5.0)
Alkaline Phosphatase: 125 U/L (ref 38–126)
Anion gap: 6 (ref 5–15)
BUN: 20 mg/dL (ref 8–23)
CO2: 32 mmol/L (ref 22–32)
Calcium: 10.4 mg/dL — ABNORMAL HIGH (ref 8.9–10.3)
Chloride: 104 mmol/L (ref 98–111)
Creatinine: 1.01 mg/dL — ABNORMAL HIGH (ref 0.44–1.00)
GFR, Est AFR Am: 56 mL/min — ABNORMAL LOW (ref >60)
GFR, Estimated: 49 mL/min — ABNORMAL LOW (ref >60)
Glucose, Bld: 98 mg/dL (ref 70–99)
Potassium: 4.3 mmol/L (ref 3.5–5.1)
Sodium: 142 mmol/L (ref 135–145)
Total Bilirubin: 1.8 mg/dL — ABNORMAL HIGH (ref 0.3–1.2)
Total Protein: 7.1 g/dL (ref 6.5–8.1)

## 2019-08-21 NOTE — Progress Notes (Signed)
Hematology and Oncology Follow Up Visit  Amy White [id number] [birth year] 86 y.o. 08/21/2019   Principle Diagnosis:   Metastatic breast cancer-bone only metastasis  Current Therapy:    Aromasin 25 mg p.o. Daily -- d/c on 06/15/2018  Ribociclib 200 mg po q day (21/14) -- changed on 03/27/2018 -- patient stopped on her own in 04/2018  Faslodex 500 mg IM q month - start 07/25/2018 -- pateint d/c herself  Ibrance 75 mg q day  (14on/14off)-- start on 07/28/2018  Xgeva 120 mg sq q 3 months - on hold permanently due to bad teeth.      Interim History:  Amy White is back for follow-up.  Overall, I think she is doing relatively well.  She does seem to be managing all of her health issues.  She has cardiac issues.  She has rheumatological issues.  We last saw her 3 months ago.  She has had no flareups of her heart.  She has had no fever.  She has had no change in bowel or bladder habits.  She is on Coumadin.  She has had no bleeding.  I think her last CA 27.29 back in December was 47.  She has had the arthritis.  She has had no flareups of her arthritis.  Overall, I would say her performance status is ECOG 3.    Medications:  Current Outpatient Medications:  .  acetaminophen (TYLENOL) 500 MG tablet, Take 500 mg by mouth every 6 (six) hours as needed for fever., Disp: , Rfl:  .  albuterol (PROVENTIL HFA;VENTOLIN HFA) 108 (90 BASE) MCG/ACT inhaler, Inhale 2 puffs into the lungs every 6 (six) hours as needed for wheezing or shortness of breath., Disp: , Rfl:  .  ALPRAZolam (XANAX) 0.5 MG tablet, Take 0.25 mg by mouth 2 (two) times daily as needed for anxiety. , Disp: , Rfl: 0 .  bisoprolol (ZEBETA) 5 MG tablet, Take 2 tablets (10 mg total) by mouth daily., Disp: 180 tablet, Rfl: 1 .  cromolyn (OPTICROM) 4 % ophthalmic solution, INT 1 GTT IN OU QID, Disp: , Rfl:  .  diphenoxylate-atropine (LOMOTIL) 2.5-0.025 MG tablet, TAKE 1 TABLET BY MOUTH AFTER EACH LOOSE STOOL, MAXIMUM DAILY  DOSE IS 8 TABLETS (Patient not taking: Reported on 05/15/2019), Disp: 100 tablet, Rfl: 0 .  feeding supplement, ENSURE ENLIVE, (ENSURE ENLIVE) LIQD, Take 237 mLs by mouth 2 (two) times daily between meals., Disp: 237 mL, Rfl: 12 .  furosemide (LASIX) 40 MG tablet, Take 20 mg by mouth daily as needed. , Disp: , Rfl:  .  levothyroxine (SYNTHROID, LEVOTHROID) 88 MCG tablet, Take 88 mcg by mouth daily., Disp: , Rfl:  .  oxyCODONE-acetaminophen (PERCOCET/ROXICET) 5-325 MG tablet, Take 1 tablet by mouth every 4 (four) hours as needed for severe pain (For pain.). , Disp: , Rfl:  .  palbociclib (IBRANCE) 75 MG capsule, Take 1 capsule (75 mg total) by mouth daily with breakfast. Take whole with food. Take for 14 days on, 14 days off, repeat every 28 days. (Patient not taking: Reported on 05/15/2019), Disp: 14 capsule, Rfl: 5 .  polyethylene glycol (MIRALAX / GLYCOLAX) packet, Take 17 g by mouth daily as needed for mild constipation. (Patient not taking: Reported on 05/15/2019), Disp: 14 each, Rfl: 0 .  potassium chloride SA (K-DUR,KLOR-CON) 10 MEQ tablet, Take 2 tablets (20 mEq total) by mouth daily. (Patient not taking: Reported on 05/15/2019), Disp: 60 tablet, Rfl: 6 .  tetrahydrozoline-zinc (VISINE-AC) 0.05-0.25 % ophthalmic solution, Place 2 drops  into both eyes 3 (three) times daily as needed (red eyes)., Disp: , Rfl:  .  UNABLE TO FIND, Hair prosthesis (Patient not taking: Reported on 05/15/2019), Disp: 1 each, Rfl: 0 .  Vitamin D, Ergocalciferol, (DRISDOL) 1.25 MG (50000 UT) CAPS capsule, TAKE 1 CAPSULE BY MOUTH 1 TIME A WEEK, Disp: 12 capsule, Rfl: 3 .  warfarin (COUMADIN) 5 MG tablet, TAKE ONE-HALF TO 1 TABLET BY MOUTH DAILY AS DIRECTED BY COUMADIN CLINIC, Disp: 90 tablet, Rfl: 0  Allergies:  Allergies  Allergen Reactions  . Morphine And Related Rash  . Crestor [Rosuvastatin] Other (See Comments)    Reaction unknown  . Sulfa Antibiotics Other (See Comments)    Mouth breaks out in sores  .  Ciprofloxacin Rash and Other (See Comments)    Rash in the mouth  . Penicillins Hives, Rash and Other (See Comments)    Has patient had a PCN reaction causing immediate rash, facial/tongue/throat swelling, SOB or lightheadedness with hypotension: yes Has patient had a PCN reaction causing severe rash involving mucus membranes or skin necrosis: no Has patient had a PCN reaction that required hospitalization: no Has patient had a PCN reaction occurring within the last 10 years: yes If all of the above answers are "NO", then may proceed with Cephalosporin use.   . Vancomycin Rash and Other (See Comments)    "Red-man"    Past Medical History, Surgical history, Social history, and Family History were reviewed and updated.  Review of Systems: Review of Systems  Constitutional: Negative.   HENT:  Negative.   Eyes: Negative.   Respiratory: Negative.   Cardiovascular: Negative.   Gastrointestinal: Negative.   Endocrine: Negative.   Genitourinary: Negative.    Musculoskeletal: Positive for back pain and myalgias.  Skin: Negative.   Neurological: Negative.   Hematological: Negative.   Psychiatric/Behavioral: Negative.     Physical Exam:  weight is 146 lb (66.2 kg). Her temporal temperature is 97.1 F (36.2 C) (abnormal). Her blood pressure is 119/69 and her pulse is 86. Her respiration is 20 and oxygen saturation is 97%.   Wt Readings from Last 3 Encounters:  08/21/19 146 lb (66.2 kg)  05/19/19 151 lb 4 oz (68.6 kg)  05/15/19 151 lb 6.4 oz (68.7 kg)    Physical Exam Vitals reviewed.  HENT:     Head: Normocephalic and atraumatic.  Eyes:     Pupils: Pupils are equal, round, and reactive to light.  Cardiovascular:     Rate and Rhythm: Normal rate and regular rhythm.     Heart sounds: Normal heart sounds.     Comments: Cardiac exam is irregular rate and irregular rhythm consistent with atrial fibrillation.  The heart shows no obvious murmurs or rubs. Pulmonary:     Effort:  Pulmonary effort is normal.     Breath sounds: Normal breath sounds.  Abdominal:     General: Bowel sounds are normal.     Palpations: Abdomen is soft.  Musculoskeletal:        General: No tenderness or deformity. Normal range of motion.     Cervical back: Normal range of motion.  Lymphadenopathy:     Cervical: No cervical adenopathy.  Skin:    General: Skin is warm and dry.     Findings: No erythema or rash.  Neurological:     Mental Status: She is alert and oriented to person, place, and time.  Psychiatric:        Behavior: Behavior normal.  Thought Content: Thought content normal.        Judgment: Judgment normal.      Lab Results  Component Value Date   WBC 4.5 08/21/2019   HGB 11.5 (L) 08/21/2019   HCT 35.2 (L) 08/21/2019   MCV 91.0 08/21/2019   PLT 160 08/21/2019     Chemistry      Component Value Date/Time   NA 142 08/21/2019 1442   NA 142 03/22/2018 1638   NA 146 (H) 01/25/2017 1448   NA 142 07/31/2015 1203   K 4.3 08/21/2019 1442   K 4.0 01/25/2017 1448   K 5.1 07/31/2015 1203   CL 104 08/21/2019 1442   CL 108 01/25/2017 1448   CO2 32 08/21/2019 1442   CO2 29 01/25/2017 1448   CO2 23 07/31/2015 1203   BUN 20 08/21/2019 1442   BUN 13 03/22/2018 1638   BUN 18 01/25/2017 1448   BUN 23.3 07/31/2015 1203   CREATININE 1.01 (H) 08/21/2019 1442   CREATININE 1.2 01/25/2017 1448   CREATININE 1.1 07/31/2015 1203      Component Value Date/Time   CALCIUM 10.4 (H) 08/21/2019 1442   CALCIUM 9.3 01/25/2017 1448   CALCIUM 9.3 07/31/2015 1203   ALKPHOS 125 08/21/2019 1442   ALKPHOS 64 01/25/2017 1448   ALKPHOS 57 07/31/2015 1203   AST 21 08/21/2019 1442   AST 18 07/31/2015 1203   ALT 11 08/21/2019 1442   ALT 20 01/25/2017 1448   ALT 15 07/31/2015 1203   BILITOT 1.8 (H) 08/21/2019 1442   BILITOT 0.96 07/31/2015 1203      Impression and Plan: Ms. Gengler is a 86 year old white female.  She has metastatic breast cancer.  It seems as if her disease is  still confined to the bones.  For right now, she is off all treatment for the breast cancer.  She just is not can take any treatment.  She does not want to deal with any insurance issues.  I can understand this.  This is all about her quality of life.  Her quality of life is really dictated by her cardiac disease and her arthritis right now.  We will plan to get her back in 3 months.  I think this would be very reasonable.  Her birthday is coming up.  I am just glad to see that she has made it to 86 years old.  Volanda Napoleon, MD 3/15/20213:32 PM

## 2019-08-22 ENCOUNTER — Telehealth: Payer: Self-pay | Admitting: Hematology & Oncology

## 2019-08-22 LAB — CANCER ANTIGEN 27.29: CA 27.29: 69.8 U/mL — ABNORMAL HIGH (ref 0.0–38.6)

## 2019-08-22 NOTE — Telephone Encounter (Signed)
Appointments scheduled calendar mailed per 3/15 los 

## 2019-09-19 DIAGNOSIS — R0600 Dyspnea, unspecified: Secondary | ICD-10-CM | POA: Diagnosis not present

## 2019-09-19 DIAGNOSIS — I129 Hypertensive chronic kidney disease with stage 1 through stage 4 chronic kidney disease, or unspecified chronic kidney disease: Secondary | ICD-10-CM | POA: Diagnosis not present

## 2019-09-19 DIAGNOSIS — R609 Edema, unspecified: Secondary | ICD-10-CM | POA: Diagnosis not present

## 2019-09-20 ENCOUNTER — Ambulatory Visit
Admission: RE | Admit: 2019-09-20 | Discharge: 2019-09-20 | Disposition: A | Discharge status: Home / Self Care | Payer: Medicare Other | Source: Ambulatory Visit | Attending: Family Medicine | Admitting: Family Medicine

## 2019-09-20 ENCOUNTER — Ambulatory Visit (INDEPENDENT_AMBULATORY_CARE_PROVIDER_SITE_OTHER): Payer: Medicare Other | Admitting: Pharmacist Clinician (PhC)/ Clinical Pharmacy Specialist

## 2019-09-20 ENCOUNTER — Other Ambulatory Visit: Payer: Self-pay | Admitting: Family Medicine

## 2019-09-20 ENCOUNTER — Other Ambulatory Visit: Payer: Self-pay

## 2019-09-20 DIAGNOSIS — R609 Edema, unspecified: Secondary | ICD-10-CM

## 2019-09-20 DIAGNOSIS — Z7901 Long term (current) use of anticoagulants: Secondary | ICD-10-CM

## 2019-09-20 DIAGNOSIS — Z1152 Encounter for screening for COVID-19: Secondary | ICD-10-CM | POA: Diagnosis not present

## 2019-09-20 DIAGNOSIS — J90 Pleural effusion, not elsewhere classified: Secondary | ICD-10-CM

## 2019-09-20 DIAGNOSIS — I4891 Unspecified atrial fibrillation: Secondary | ICD-10-CM

## 2019-09-20 DIAGNOSIS — R0602 Shortness of breath: Secondary | ICD-10-CM | POA: Diagnosis not present

## 2019-09-20 LAB — POCT INR: INR: 3.8 — AB (ref 2.0–3.0)

## 2019-09-20 NOTE — Patient Instructions (Signed)
HOLD warfarin today, then decrease dose to 1 tablet each Monday and Friday, 1/2 tablet all other days.  Repeat INR in 2 weeks.

## 2019-09-21 ENCOUNTER — Other Ambulatory Visit: Payer: Self-pay | Admitting: Hematology & Oncology

## 2019-09-22 ENCOUNTER — Ambulatory Visit (HOSPITAL_COMMUNITY)
Admission: RE | Admit: 2019-09-22 | Discharge: 2019-09-22 | Disposition: A | Discharge status: Home / Self Care | Payer: Medicare Other | Source: Ambulatory Visit | Attending: Family Medicine | Admitting: Family Medicine

## 2019-09-22 ENCOUNTER — Other Ambulatory Visit: Payer: Self-pay

## 2019-09-22 ENCOUNTER — Other Ambulatory Visit: Payer: Self-pay | Admitting: Family Medicine

## 2019-09-22 DIAGNOSIS — J939 Pneumothorax, unspecified: Secondary | ICD-10-CM | POA: Diagnosis not present

## 2019-09-22 DIAGNOSIS — J90 Pleural effusion, not elsewhere classified: Secondary | ICD-10-CM

## 2019-09-22 HISTORY — PX: IR THORACENTESIS ASP PLEURAL SPACE W/IMG GUIDE: IMG5380

## 2019-09-22 MED ORDER — LIDOCAINE HCL (PF) 1 % IJ SOLN
INTRAMUSCULAR | Status: DC | PRN
  Administered 2019-09-22: 10 mL

## 2019-09-22 MED ORDER — LIDOCAINE HCL 1 % IJ SOLN
INTRAMUSCULAR | Status: AC
  Filled 2019-09-22: qty 20

## 2019-09-22 NOTE — Procedures (Signed)
PROCEDURE SUMMARY:  Successful image-guided left thoracentesis. Yielded 1.55 liters of hazy amber fluid. Patient tolerated procedure well. No immediate complications. EBL < 5 mL.  Specimen was not sent for labs. CXR ordered.  Please see imaging section of Epic for full dictation.   Claris Pong Edan Serratore PA-C 09/22/2019 1:35 PM

## 2019-09-22 NOTE — Progress Notes (Signed)
IR.  Patient underwent an image-guided left thoracentesis yielding 1.55 L of hazy amber fluid today in IR. Post-procedure CXR revealed an ex vacuo left PTX. Patient currently asymptomatic- denies dyspnea or chest pain- sating at 96-98% on room air. Case was discussed with Dr. Vernard Gambles who recommends monitoring patient for a minimum 1 hour for symptoms. Patient was monitored in IR department for 1 hour. She denies symptoms (chest pain, dyspnea) at this time. Still sating at 96-98% on room air. Plan to discharge home today in stable condition. Advised patient to come to the ED if she notices dyspnea or chest pain. All questions answered and concerns addressed.  Please call IR with questions/concerns.   Bea Graff Annaliah Rivenbark, PA-C 09/22/2019, 3:34 PM

## 2019-09-22 NOTE — Progress Notes (Unsigned)
Pharmacist Chemotherapy Monitoring - Follow Up Assessment    I verify that I have reviewed each item in the below checklist:  . Regimen for the patient is scheduled for the appropriate day and plan matches scheduled date. Marland Kitchen Appropriate non-routine labs are ordered dependent on drug ordered. . If applicable, additional medications reviewed and ordered per protocol based on lifetime cumulative doses and/or treatment regimen.   Plan for follow-up and/or issues identified: No . I-vent associated with next due treatment: No . MD and/or nursing notified: No  Aneesah Hernan, Jacqlyn Larsen 09/22/2019 8:14 AM

## 2019-09-25 ENCOUNTER — Other Ambulatory Visit: Payer: Self-pay

## 2019-09-25 ENCOUNTER — Ambulatory Visit (INDEPENDENT_AMBULATORY_CARE_PROVIDER_SITE_OTHER): Payer: Medicare Other

## 2019-09-25 ENCOUNTER — Encounter (HOSPITAL_COMMUNITY): Payer: Self-pay

## 2019-09-25 ENCOUNTER — Ambulatory Visit (HOSPITAL_COMMUNITY)
Admission: EM | Admit: 2019-09-25 | Discharge: 2019-09-25 | Disposition: A | Discharge status: Home / Self Care | Payer: Medicare Other | Attending: Physician Assistant | Admitting: Physician Assistant

## 2019-09-25 DIAGNOSIS — R0602 Shortness of breath: Secondary | ICD-10-CM | POA: Diagnosis not present

## 2019-09-25 DIAGNOSIS — J948 Other specified pleural conditions: Secondary | ICD-10-CM

## 2019-09-25 DIAGNOSIS — T161XXA Foreign body in right ear, initial encounter: Secondary | ICD-10-CM | POA: Diagnosis not present

## 2019-09-25 NOTE — ED Triage Notes (Signed)
Pt states she has a q-tip stuck in her right ear today

## 2019-09-25 NOTE — Discharge Instructions (Addendum)
No longer use Qtips  Continue to follow up with all of your Oncology appointments. Please call your Doctors tomorrow to discuss your symptoms.  If feeling more short of breath or experiencing chest pain please return or go to the Emergency Department
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
## 2019-09-26 ENCOUNTER — Telehealth: Payer: Self-pay | Admitting: *Deleted

## 2019-09-26 ENCOUNTER — Other Ambulatory Visit: Payer: Self-pay | Admitting: *Deleted

## 2019-09-26 NOTE — Telephone Encounter (Signed)
Call received from patient to inform Dr. Marin Olp that she had a CXR yesterday at the Urgent Care and would like to know if she needs to come in for an earlier appt prior to Fridays scheduled appt.  Pt states that she is "feeling ok with occasional SOB."  Dr. Marin Olp notified and does not feel as though pt needs to come in prior to scheduled appt on 09/29/19.  CXR results faxed to patient's PCP and Cardiologist.  Pt appreciative of assistance and has no further questions at this time.

## 2019-09-28 ENCOUNTER — Inpatient Hospital Stay: Payer: Medicare Other | Attending: Hematology

## 2019-09-28 ENCOUNTER — Other Ambulatory Visit: Payer: Self-pay

## 2019-09-28 DIAGNOSIS — C50911 Malignant neoplasm of unspecified site of right female breast: Secondary | ICD-10-CM | POA: Diagnosis present

## 2019-09-28 DIAGNOSIS — Z7901 Long term (current) use of anticoagulants: Secondary | ICD-10-CM | POA: Diagnosis not present

## 2019-09-28 DIAGNOSIS — M1990 Unspecified osteoarthritis, unspecified site: Secondary | ICD-10-CM | POA: Insufficient documentation

## 2019-09-28 DIAGNOSIS — M549 Dorsalgia, unspecified: Secondary | ICD-10-CM | POA: Insufficient documentation

## 2019-09-28 DIAGNOSIS — Z79811 Long term (current) use of aromatase inhibitors: Secondary | ICD-10-CM | POA: Diagnosis not present

## 2019-09-28 DIAGNOSIS — C7951 Secondary malignant neoplasm of bone: Secondary | ICD-10-CM | POA: Diagnosis not present

## 2019-09-28 DIAGNOSIS — C50919 Malignant neoplasm of unspecified site of unspecified female breast: Secondary | ICD-10-CM | POA: Diagnosis not present

## 2019-09-28 DIAGNOSIS — Z170 Estrogen receptor positive status [ER+]: Secondary | ICD-10-CM | POA: Diagnosis not present

## 2019-09-28 DIAGNOSIS — Z79899 Other long term (current) drug therapy: Secondary | ICD-10-CM | POA: Insufficient documentation

## 2019-09-28 LAB — CBC WITH DIFFERENTIAL (CANCER CENTER ONLY)
Abs Immature Granulocytes: 0.02 10*3/uL (ref 0.00–0.07)
Basophils Absolute: 0 10*3/uL (ref 0.0–0.1)
Basophils Relative: 1 %
Eosinophils Absolute: 0.2 10*3/uL (ref 0.0–0.5)
Eosinophils Relative: 4 %
HCT: 31.8 % — ABNORMAL LOW (ref 36.0–46.0)
Hemoglobin: 10.1 g/dL — ABNORMAL LOW (ref 12.0–15.0)
Immature Granulocytes: 1 %
Lymphocytes Relative: 23 %
Lymphs Abs: 0.9 10*3/uL (ref 0.7–4.0)
MCH: 29.4 pg (ref 26.0–34.0)
MCHC: 31.8 g/dL (ref 30.0–36.0)
MCV: 92.4 fL (ref 80.0–100.0)
Monocytes Absolute: 0.4 10*3/uL (ref 0.1–1.0)
Monocytes Relative: 9 %
Neutro Abs: 2.4 10*3/uL (ref 1.7–7.7)
Neutrophils Relative %: 62 %
Platelet Count: 150 10*3/uL (ref 150–400)
RBC: 3.44 MIL/uL — ABNORMAL LOW (ref 3.87–5.11)
RDW: 15.1 % (ref 11.5–15.5)
WBC Count: 3.8 10*3/uL — ABNORMAL LOW (ref 4.0–10.5)
nRBC: 0 % (ref 0.0–0.2)

## 2019-09-28 LAB — CMP (CANCER CENTER ONLY)
ALT: 13 U/L (ref 0–44)
AST: 22 U/L (ref 15–41)
Albumin: 3.3 g/dL — ABNORMAL LOW (ref 3.5–5.0)
Alkaline Phosphatase: 139 U/L — ABNORMAL HIGH (ref 38–126)
Anion gap: 5 (ref 5–15)
BUN: 24 mg/dL — ABNORMAL HIGH (ref 8–23)
CO2: 30 mmol/L (ref 22–32)
Calcium: 10 mg/dL (ref 8.9–10.3)
Chloride: 104 mmol/L (ref 98–111)
Creatinine: 1.11 mg/dL — ABNORMAL HIGH (ref 0.44–1.00)
GFR, Est AFR Am: 50 mL/min — ABNORMAL LOW (ref >60)
GFR, Estimated: 43 mL/min — ABNORMAL LOW (ref >60)
Glucose, Bld: 119 mg/dL — ABNORMAL HIGH (ref 70–99)
Potassium: 3.9 mmol/L (ref 3.5–5.1)
Sodium: 139 mmol/L (ref 135–145)
Total Bilirubin: 1.3 mg/dL — ABNORMAL HIGH (ref 0.3–1.2)
Total Protein: 6.8 g/dL (ref 6.5–8.1)

## 2019-09-29 ENCOUNTER — Telehealth: Payer: Self-pay | Admitting: Hematology & Oncology

## 2019-09-29 ENCOUNTER — Inpatient Hospital Stay: Payer: Medicare Other

## 2019-09-29 ENCOUNTER — Inpatient Hospital Stay (HOSPITAL_BASED_OUTPATIENT_CLINIC_OR_DEPARTMENT_OTHER): Payer: Medicare Other | Admitting: Hematology & Oncology

## 2019-09-29 ENCOUNTER — Encounter: Payer: Self-pay | Admitting: Hematology & Oncology

## 2019-09-29 ENCOUNTER — Other Ambulatory Visit: Payer: Self-pay

## 2019-09-29 ENCOUNTER — Other Ambulatory Visit: Payer: Medicare Other

## 2019-09-29 ENCOUNTER — Ambulatory Visit: Payer: Medicare Other | Admitting: Hematology & Oncology

## 2019-09-29 ENCOUNTER — Ambulatory Visit: Payer: Medicare Other

## 2019-09-29 ENCOUNTER — Encounter: Payer: Self-pay | Admitting: Pharmacy Technician

## 2019-09-29 VITALS — BP 141/67 | HR 73 | Temp 97.3°F | Resp 18 | Ht 65.0 in | Wt 147.1 lb

## 2019-09-29 DIAGNOSIS — M1990 Unspecified osteoarthritis, unspecified site: Secondary | ICD-10-CM | POA: Diagnosis not present

## 2019-09-29 DIAGNOSIS — Z79899 Other long term (current) drug therapy: Secondary | ICD-10-CM | POA: Diagnosis not present

## 2019-09-29 DIAGNOSIS — C50919 Malignant neoplasm of unspecified site of unspecified female breast: Secondary | ICD-10-CM | POA: Diagnosis not present

## 2019-09-29 DIAGNOSIS — Z79811 Long term (current) use of aromatase inhibitors: Secondary | ICD-10-CM | POA: Diagnosis not present

## 2019-09-29 DIAGNOSIS — M549 Dorsalgia, unspecified: Secondary | ICD-10-CM | POA: Diagnosis not present

## 2019-09-29 DIAGNOSIS — C7951 Secondary malignant neoplasm of bone: Secondary | ICD-10-CM

## 2019-09-29 DIAGNOSIS — Z170 Estrogen receptor positive status [ER+]: Secondary | ICD-10-CM | POA: Diagnosis not present

## 2019-09-29 DIAGNOSIS — Z7901 Long term (current) use of anticoagulants: Secondary | ICD-10-CM | POA: Diagnosis not present

## 2019-09-29 LAB — CANCER ANTIGEN 27.29: CA 27.29: 57.5 U/mL — ABNORMAL HIGH (ref 0.0–38.6)

## 2019-09-29 NOTE — Telephone Encounter (Signed)
Appointments scheduled calendar printed per 4/23 los

## 2019-09-29 NOTE — Progress Notes (Signed)
Hematology and Oncology Follow Up Visit  Amy White [id number] [birth year] 86 y.o. 09/29/2019   Principle Diagnosis:   Metastatic breast cancer-bone only metastasis  Current Therapy:    Aromasin 25 mg p.o. Daily -- d/c on 06/15/2018  Ribociclib 200 mg po q day (21/14) -- changed on 03/27/2018 -- patient stopped on her own in 04/2018  Faslodex 500 mg IM q month - re-start on  09/29/2019   Ibrance 75 mg q day  (14on/14off)-- d/c  Xgeva 120 mg sq q 3 months - on hold permanently due to bad teeth.      Interim History:  Ms. Amy White is back for follow-up.  We last saw her, we noted that her CA 27.29 was up to 70.  It had been 47.  I think that we really have to get her back on Faslodex.  She had stopped this about a year ago.  There were some insurance problems with it.  I am not sure exactly why she would have to pay so much.  She also had 1.55 L of fluid removed from her left lung.  This was done about a week or so ago.  Unfortunately, no studies were sent off on the fluid to make sure this was not malignant.  She is still bothered by her arthritis.  She does have cardiac issues.  These are relatively stable.  Her appetite is okay.  She has had no nausea or vomiting.   Overall, her performance status is ECOG 3.    Medications:  Current Outpatient Medications:  .  acetaminophen (TYLENOL) 500 MG tablet, Take 500 mg by mouth every 6 (six) hours as needed for fever., Disp: , Rfl:  .  albuterol (PROVENTIL HFA;VENTOLIN HFA) 108 (90 BASE) MCG/ACT inhaler, Inhale 2 puffs into the lungs every 6 (six) hours as needed for wheezing or shortness of breath., Disp: , Rfl:  .  ALPRAZolam (XANAX) 0.5 MG tablet, Take 0.25 mg by mouth 2 (two) times daily as needed for anxiety. , Disp: , Rfl: 0 .  bisoprolol (ZEBETA) 5 MG tablet, Take 2 tablets (10 mg total) by mouth daily., Disp: 180 tablet, Rfl: 1 .  cromolyn (OPTICROM) 4 % ophthalmic solution, INT 1 GTT IN OU QID, Disp: , Rfl:  .  feeding  supplement, ENSURE ENLIVE, (ENSURE ENLIVE) LIQD, Take 237 mLs by mouth 2 (two) times daily between meals., Disp: 237 mL, Rfl: 12 .  furosemide (LASIX) 40 MG tablet, Take 20 mg by mouth daily as needed. , Disp: , Rfl:  .  levothyroxine (SYNTHROID, LEVOTHROID) 88 MCG tablet, Take 88 mcg by mouth daily., Disp: , Rfl:  .  oxyCODONE-acetaminophen (PERCOCET/ROXICET) 5-325 MG tablet, Take 1 tablet by mouth every 4 (four) hours as needed for severe pain (For pain.). , Disp: , Rfl:  .  tetrahydrozoline-zinc (VISINE-AC) 0.05-0.25 % ophthalmic solution, Place 2 drops into both eyes 3 (three) times daily as needed (red eyes)., Disp: , Rfl:  .  Vitamin D, Ergocalciferol, (DRISDOL) 1.25 MG (50000 UT) CAPS capsule, TAKE 1 CAPSULE BY MOUTH 1 TIME A WEEK, Disp: 12 capsule, Rfl: 3 .  warfarin (COUMADIN) 5 MG tablet, TAKE ONE-HALF TO 1 TABLET BY MOUTH DAILY AS DIRECTED BY COUMADIN CLINIC, Disp: 90 tablet, Rfl: 0 .  diphenoxylate-atropine (LOMOTIL) 2.5-0.025 MG tablet, TAKE 1 TABLET BY MOUTH AFTER EACH LOOSE STOOL, MAXIMUM DAILY DOSE IS 8 TABLETS (Patient not taking: Reported on 05/15/2019), Disp: 100 tablet, Rfl: 0 .  palbociclib (IBRANCE) 75 MG capsule, Take 1 capsule (  75 mg total) by mouth daily with breakfast. Take whole with food. Take for 14 days on, 14 days off, repeat every 28 days. (Patient not taking: Reported on 05/15/2019), Disp: 14 capsule, Rfl: 5 .  polyethylene glycol (MIRALAX / GLYCOLAX) packet, Take 17 g by mouth daily as needed for mild constipation. (Patient not taking: Reported on 05/15/2019), Disp: 14 each, Rfl: 0 .  potassium chloride SA (K-DUR,KLOR-CON) 10 MEQ tablet, Take 2 tablets (20 mEq total) by mouth daily. (Patient not taking: Reported on 05/15/2019), Disp: 60 tablet, Rfl: 6 .  UNABLE TO FIND, Hair prosthesis (Patient not taking: Reported on 05/15/2019), Disp: 1 each, Rfl: 0  Allergies:  Allergies  Allergen Reactions  . Morphine And Related Rash  . Sulfa Antibiotics Other (See Comments)     Mouth breaks out in sores  . Ciprofloxacin Rash and Other (See Comments)    Rash in the mouth  . Crestor [Rosuvastatin] Other (See Comments)    Unknown reaction  . Penicillins Hives, Rash and Other (See Comments)    Has patient had a PCN reaction causing immediate rash, facial/tongue/throat swelling, SOB or lightheadedness with hypotension: yes Has patient had a PCN reaction causing severe rash involving mucus membranes or skin necrosis: no Has patient had a PCN reaction that required hospitalization: no Has patient had a PCN reaction occurring within the last 10 years: yes If all of the above answers are "NO", then may proceed with Cephalosporin use.   . Vancomycin Rash and Other (See Comments)    "Red-man"    Past Medical History, Surgical history, Social history, and Family History were reviewed and updated.  Review of Systems: Review of Systems  Constitutional: Negative.   HENT:  Negative.   Eyes: Negative.   Respiratory: Negative.   Cardiovascular: Negative.   Gastrointestinal: Negative.   Endocrine: Negative.   Genitourinary: Negative.    Musculoskeletal: Positive for back pain and myalgias.  Skin: Negative.   Neurological: Negative.   Hematological: Negative.   Psychiatric/Behavioral: Negative.     Physical Exam:  height is 5\' 5"  (1.651 m) and weight is 147 lb 1.9 oz (66.7 kg). Her temporal temperature is 97.3 F (36.3 C) (abnormal). Her blood pressure is 141/67 (abnormal) and her pulse is 73. Her respiration is 18 and oxygen saturation is 98%.   Wt Readings from Last 3 Encounters:  09/29/19 147 lb 1.9 oz (66.7 kg)  09/25/19 140 lb (63.5 kg)  08/21/19 146 lb (66.2 kg)    Physical Exam Vitals reviewed.  HENT:     Head: Normocephalic and atraumatic.  Eyes:     Pupils: Pupils are equal, round, and reactive to light.  Cardiovascular:     Rate and Rhythm: Normal rate and regular rhythm.     Heart sounds: Normal heart sounds.     Comments: Cardiac exam is  irregular rate and irregular rhythm consistent with atrial fibrillation.  The heart shows no obvious murmurs or rubs. Pulmonary:     Effort: Pulmonary effort is normal.     Breath sounds: Normal breath sounds.  Abdominal:     General: Bowel sounds are normal.     Palpations: Abdomen is soft.  Musculoskeletal:        General: No tenderness or deformity. Normal range of motion.     Cervical back: Normal range of motion.  Lymphadenopathy:     Cervical: No cervical adenopathy.  Skin:    General: Skin is warm and dry.     Findings: No erythema or  rash.  Neurological:     Mental Status: She is alert and oriented to person, place, and time.  Psychiatric:        Behavior: Behavior normal.        Thought Content: Thought content normal.        Judgment: Judgment normal.      Lab Results  Component Value Date   WBC 3.8 (L) 09/28/2019   HGB 10.1 (L) 09/28/2019   HCT 31.8 (L) 09/28/2019   MCV 92.4 09/28/2019   PLT 150 09/28/2019     Chemistry      Component Value Date/Time   NA 139 09/28/2019 1432   NA 142 03/22/2018 1638   NA 146 (H) 01/25/2017 1448   NA 142 07/31/2015 1203   K 3.9 09/28/2019 1432   K 4.0 01/25/2017 1448   K 5.1 07/31/2015 1203   CL 104 09/28/2019 1432   CL 108 01/25/2017 1448   CO2 30 09/28/2019 1432   CO2 29 01/25/2017 1448   CO2 23 07/31/2015 1203   BUN 24 (H) 09/28/2019 1432   BUN 13 03/22/2018 1638   BUN 18 01/25/2017 1448   BUN 23.3 07/31/2015 1203   CREATININE 1.11 (H) 09/28/2019 1432   CREATININE 1.2 01/25/2017 1448   CREATININE 1.1 07/31/2015 1203      Component Value Date/Time   CALCIUM 10.0 09/28/2019 1432   CALCIUM 9.3 01/25/2017 1448   CALCIUM 9.3 07/31/2015 1203   ALKPHOS 139 (H) 09/28/2019 1432   ALKPHOS 64 01/25/2017 1448   ALKPHOS 57 07/31/2015 1203   AST 22 09/28/2019 1432   AST 18 07/31/2015 1203   ALT 13 09/28/2019 1432   ALT 20 01/25/2017 1448   ALT 15 07/31/2015 1203   BILITOT 1.3 (H) 09/28/2019 1432   BILITOT 0.96  07/31/2015 1203      Impression and Plan: Ms. Savannah is a 86 year old white female.  She has metastatic breast cancer.  It seems as if her disease is still confined to the bones.  Hopefully, we will find that she will respond to the Faslodex.  I forgot to mention that she did have her 89nd birthday about a month ago.  She did have a nice time.  Given her age, I really would like to avoid having to do a lot of scans on her.  I would like to see her back in 1 month.  We really cannot give her Xgeva or Zometa because of her bad arthritis and because of her bad oral dentition.    Volanda Napoleon, MD 4/23/202111:49 AM   ADDENDUM: I forgot to mention that I told her that if she does have breathing problems again, I would have to believe that her pleural fluid is coming back.  I told her to let us know so we can set up the thoracentesis and make sure that appropriate studies are done.  Lattie Haw, MD

## 2019-09-29 NOTE — Progress Notes (Signed)
Patient has been approved/aware of drug assistance by [facility] for Faslodex. The enrollment period is from 09/29/19-06/07/20 based on EOOP. First DOS covered is 10/05/19.

## 2019-09-29 NOTE — Patient Instructions (Signed)
Fulvestrant injection What is this medicine? FULVESTRANT (ful VES trant) blocks the effects of estrogen. It is used to treat breast cancer. This medicine may be used for other purposes; ask your health care provider or pharmacist if you have questions. COMMON BRAND NAME(S): FASLODEX What should I tell my health care provider before I take this medicine? They need to know if you have any of these conditions:  bleeding disorders  liver disease  low blood counts, like low white cell, platelet, or red cell counts  an unusual or allergic reaction to fulvestrant, other medicines, foods, dyes, or preservatives  pregnant or trying to get pregnant  breast-feeding How should I use this medicine? This medicine is for injection into a muscle. It is usually given by a health care professional in a hospital or clinic setting. Talk to your pediatrician regarding the use of this medicine in children. Special care may be needed. Overdosage: If you think you have taken too much of this medicine contact a poison control center or emergency room at once. NOTE: This medicine is only for you. Do not share this medicine with others. What if I miss a dose? It is important not to miss your dose. Call your doctor or health care professional if you are unable to keep an appointment. What may interact with this medicine?  medicines that treat or prevent blood clots like warfarin, enoxaparin, dalteparin, apixaban, dabigatran, and rivaroxaban This list may not describe all possible interactions. Give your health care provider a list of all the medicines, herbs, non-prescription drugs, or dietary supplements you use. Also tell them if you smoke, drink alcohol, or use illegal drugs. Some items may interact with your medicine. What should I watch for while using this medicine? Your condition will be monitored carefully while you are receiving this medicine. You will need important blood work done while you are taking  this medicine. Do not become pregnant while taking this medicine or for at least 1 year after stopping it. Women of child-bearing potential will need to have a negative pregnancy test before starting this medicine. Women should inform their doctor if they wish to become pregnant or think they might be pregnant. There is a potential for serious side effects to an unborn child. Men should inform their doctors if they wish to father a child. This medicine may lower sperm counts. Talk to your health care professional or pharmacist for more information. Do not breast-feed an infant while taking this medicine or for 1 year after the last dose. What side effects may I notice from receiving this medicine? Side effects that you should report to your doctor or health care professional as soon as possible:  allergic reactions like skin rash, itching or hives, swelling of the face, lips, or tongue  feeling faint or lightheaded, falls  pain, tingling, numbness, or weakness in the legs  signs and symptoms of infection like fever or chills; cough; flu-like symptoms; sore throat  vaginal bleeding Side effects that usually do not require medical attention (report to your doctor or health care professional if they continue or are bothersome):  aches, pains  constipation  diarrhea  headache  hot flashes  nausea, vomiting  pain at site where injected  stomach pain This list may not describe all possible side effects. Call your doctor for medical advice about side effects. You may report side effects to FDA at 1-800-FDA-1088. Where should I keep my medicine? This drug is given in a hospital or clinic and will   not be stored at home. NOTE: This sheet is a summary. It may not cover all possible information. If you have questions about this medicine, talk to your doctor, pharmacist, or health care provider.  2020 Elsevier/Gold Standard (2017-09-02 11:34:41)  

## 2019-10-04 ENCOUNTER — Ambulatory Visit (INDEPENDENT_AMBULATORY_CARE_PROVIDER_SITE_OTHER): Payer: Medicare Other | Admitting: Pharmacist Clinician (PhC)/ Clinical Pharmacy Specialist

## 2019-10-04 ENCOUNTER — Other Ambulatory Visit: Payer: Self-pay

## 2019-10-04 DIAGNOSIS — I4891 Unspecified atrial fibrillation: Secondary | ICD-10-CM | POA: Diagnosis not present

## 2019-10-04 DIAGNOSIS — Z7901 Long term (current) use of anticoagulants: Secondary | ICD-10-CM

## 2019-10-04 LAB — POCT INR: INR: 2.5 (ref 2.0–3.0)

## 2019-10-05 ENCOUNTER — Inpatient Hospital Stay: Payer: Medicare Other

## 2019-10-05 VITALS — BP 129/54 | HR 73 | Temp 98.0°F | Resp 18

## 2019-10-05 DIAGNOSIS — M1990 Unspecified osteoarthritis, unspecified site: Secondary | ICD-10-CM | POA: Diagnosis not present

## 2019-10-05 DIAGNOSIS — C50919 Malignant neoplasm of unspecified site of unspecified female breast: Secondary | ICD-10-CM | POA: Diagnosis not present

## 2019-10-05 DIAGNOSIS — M549 Dorsalgia, unspecified: Secondary | ICD-10-CM | POA: Diagnosis not present

## 2019-10-05 DIAGNOSIS — C7951 Secondary malignant neoplasm of bone: Secondary | ICD-10-CM | POA: Diagnosis not present

## 2019-10-05 DIAGNOSIS — Z7901 Long term (current) use of anticoagulants: Secondary | ICD-10-CM | POA: Diagnosis not present

## 2019-10-05 DIAGNOSIS — Z79899 Other long term (current) drug therapy: Secondary | ICD-10-CM | POA: Diagnosis not present

## 2019-10-05 DIAGNOSIS — Z170 Estrogen receptor positive status [ER+]: Secondary | ICD-10-CM | POA: Diagnosis not present

## 2019-10-05 DIAGNOSIS — Z79811 Long term (current) use of aromatase inhibitors: Secondary | ICD-10-CM | POA: Diagnosis not present

## 2019-10-05 MED ORDER — FULVESTRANT 250 MG/5ML IM SOLN
500.0000 mg | Freq: Once | INTRAMUSCULAR | Status: AC
  Administered 2019-10-05: 14:00:00 500 mg via INTRAMUSCULAR

## 2019-10-05 NOTE — Patient Instructions (Signed)
Fulvestrant injection What is this medicine? FULVESTRANT (ful VES trant) blocks the effects of estrogen. It is used to treat breast cancer. This medicine may be used for other purposes; ask your health care provider or pharmacist if you have questions. COMMON BRAND NAME(S): FASLODEX What should I tell my health care provider before I take this medicine? They need to know if you have any of these conditions:  bleeding disorders  liver disease  low blood counts, like low white cell, platelet, or red cell counts  an unusual or allergic reaction to fulvestrant, other medicines, foods, dyes, or preservatives  pregnant or trying to get pregnant  breast-feeding How should I use this medicine? This medicine is for injection into a muscle. It is usually given by a health care professional in a hospital or clinic setting. Talk to your pediatrician regarding the use of this medicine in children. Special care may be needed. Overdosage: If you think you have taken too much of this medicine contact a poison control center or emergency room at once. NOTE: This medicine is only for you. Do not share this medicine with others. What if I miss a dose? It is important not to miss your dose. Call your doctor or health care professional if you are unable to keep an appointment. What may interact with this medicine?  medicines that treat or prevent blood clots like warfarin, enoxaparin, dalteparin, apixaban, dabigatran, and rivaroxaban This list may not describe all possible interactions. Give your health care provider a list of all the medicines, herbs, non-prescription drugs, or dietary supplements you use. Also tell them if you smoke, drink alcohol, or use illegal drugs. Some items may interact with your medicine. What should I watch for while using this medicine? Your condition will be monitored carefully while you are receiving this medicine. You will need important blood work done while you are taking  this medicine. Do not become pregnant while taking this medicine or for at least 1 year after stopping it. Women of child-bearing potential will need to have a negative pregnancy test before starting this medicine. Women should inform their doctor if they wish to become pregnant or think they might be pregnant. There is a potential for serious side effects to an unborn child. Men should inform their doctors if they wish to father a child. This medicine may lower sperm counts. Talk to your health care professional or pharmacist for more information. Do not breast-feed an infant while taking this medicine or for 1 year after the last dose. What side effects may I notice from receiving this medicine? Side effects that you should report to your doctor or health care professional as soon as possible:  allergic reactions like skin rash, itching or hives, swelling of the face, lips, or tongue  feeling faint or lightheaded, falls  pain, tingling, numbness, or weakness in the legs  signs and symptoms of infection like fever or chills; cough; flu-like symptoms; sore throat  vaginal bleeding Side effects that usually do not require medical attention (report to your doctor or health care professional if they continue or are bothersome):  aches, pains  constipation  diarrhea  headache  hot flashes  nausea, vomiting  pain at site where injected  stomach pain This list may not describe all possible side effects. Call your doctor for medical advice about side effects. You may report side effects to FDA at 1-800-FDA-1088. Where should I keep my medicine? This drug is given in a hospital or clinic and will   not be stored at home. NOTE: This sheet is a summary. It may not cover all possible information. If you have questions about this medicine, talk to your doctor, pharmacist, or health care provider.  2020 Elsevier/Gold Standard (2017-09-02 11:34:41)  

## 2019-10-16 ENCOUNTER — Other Ambulatory Visit: Payer: Self-pay

## 2019-10-16 MED ORDER — WARFARIN SODIUM 5 MG PO TABS: ORAL_TABLET | ORAL | 0 refills | Status: DC

## 2019-10-17 ENCOUNTER — Ambulatory Visit (INDEPENDENT_AMBULATORY_CARE_PROVIDER_SITE_OTHER): Payer: Medicare Other | Admitting: *Deleted

## 2019-10-17 DIAGNOSIS — I495 Sick sinus syndrome: Secondary | ICD-10-CM | POA: Diagnosis not present

## 2019-10-17 DIAGNOSIS — J90 Pleural effusion, not elsewhere classified: Secondary | ICD-10-CM | POA: Diagnosis not present

## 2019-10-17 DIAGNOSIS — C50919 Malignant neoplasm of unspecified site of unspecified female breast: Secondary | ICD-10-CM | POA: Diagnosis not present

## 2019-10-17 DIAGNOSIS — C7951 Secondary malignant neoplasm of bone: Secondary | ICD-10-CM | POA: Diagnosis not present

## 2019-10-17 DIAGNOSIS — Z0000 Encounter for general adult medical examination without abnormal findings: Secondary | ICD-10-CM | POA: Diagnosis not present

## 2019-10-17 LAB — CUP PACEART REMOTE DEVICE CHECK
Battery Remaining Longevity: 41 mo
Battery Voltage: 2.98 V
Brady Statistic AP VP Percent: 0 %
Brady Statistic AP VS Percent: 0 %
Brady Statistic AS VP Percent: 34.67 %
Brady Statistic AS VS Percent: 65.33 %
Brady Statistic RA Percent Paced: 0 %
Brady Statistic RV Percent Paced: 37.48 %
Date Time Interrogation Session: 20210511082637
Implantable Lead Implant Date: 20160223
Implantable Lead Implant Date: 20160223
Implantable Lead Location: 753859
Implantable Lead Location: 753860
Implantable Lead Model: 5076
Implantable Lead Model: 5076
Implantable Pulse Generator Implant Date: 20160223
Lead Channel Impedance Value: 304 Ohm
Lead Channel Impedance Value: 399 Ohm
Lead Channel Impedance Value: 418 Ohm
Lead Channel Impedance Value: 532 Ohm
Lead Channel Pacing Threshold Amplitude: 0.75 V
Lead Channel Pacing Threshold Amplitude: 1 V
Lead Channel Pacing Threshold Pulse Width: 0.4 ms
Lead Channel Pacing Threshold Pulse Width: 0.4 ms
Lead Channel Sensing Intrinsic Amplitude: 0.625 mV
Lead Channel Sensing Intrinsic Amplitude: 0.625 mV
Lead Channel Sensing Intrinsic Amplitude: 11.125 mV
Lead Channel Sensing Intrinsic Amplitude: 11.125 mV
Lead Channel Setting Pacing Amplitude: 2.5 V
Lead Channel Setting Pacing Pulse Width: 0.4 ms
Lead Channel Setting Sensing Sensitivity: 0.9 mV

## 2019-10-19 NOTE — Progress Notes (Signed)
Remote pacemaker transmission.   

## 2019-11-01 ENCOUNTER — Inpatient Hospital Stay: Payer: Medicare Other

## 2019-11-01 ENCOUNTER — Inpatient Hospital Stay: Payer: Medicare Other | Attending: Hematology

## 2019-11-01 ENCOUNTER — Inpatient Hospital Stay: Payer: Medicare Other | Admitting: Hematology & Oncology

## 2019-11-22 ENCOUNTER — Ambulatory Visit (INDEPENDENT_AMBULATORY_CARE_PROVIDER_SITE_OTHER): Payer: Medicare Other | Admitting: Pharmacist

## 2019-11-22 ENCOUNTER — Other Ambulatory Visit: Payer: Self-pay

## 2019-11-22 DIAGNOSIS — Z7901 Long term (current) use of anticoagulants: Secondary | ICD-10-CM

## 2019-11-22 DIAGNOSIS — I4891 Unspecified atrial fibrillation: Secondary | ICD-10-CM

## 2019-11-22 LAB — POCT INR: INR: 2.1 (ref 2.0–3.0)

## 2019-11-22 NOTE — Patient Instructions (Signed)
Description   Continue with 1 tablet each Monday and Friday, 1/2 tablet all other days. Repeat INR in 4 weeks.

## 2019-11-23 ENCOUNTER — Inpatient Hospital Stay (HOSPITAL_BASED_OUTPATIENT_CLINIC_OR_DEPARTMENT_OTHER): Payer: Medicare Other | Admitting: Hematology & Oncology

## 2019-11-23 ENCOUNTER — Encounter: Payer: Self-pay | Admitting: Hematology & Oncology

## 2019-11-23 ENCOUNTER — Inpatient Hospital Stay: Payer: Medicare Other | Attending: Hematology

## 2019-11-23 VITALS — BP 132/63 | HR 84 | Temp 97.1°F | Resp 20 | Wt 148.0 lb

## 2019-11-23 DIAGNOSIS — C7951 Secondary malignant neoplasm of bone: Secondary | ICD-10-CM

## 2019-11-23 DIAGNOSIS — C50919 Malignant neoplasm of unspecified site of unspecified female breast: Secondary | ICD-10-CM

## 2019-11-23 DIAGNOSIS — R531 Weakness: Secondary | ICD-10-CM | POA: Insufficient documentation

## 2019-11-23 DIAGNOSIS — Z79899 Other long term (current) drug therapy: Secondary | ICD-10-CM | POA: Insufficient documentation

## 2019-11-23 DIAGNOSIS — Z7901 Long term (current) use of anticoagulants: Secondary | ICD-10-CM | POA: Diagnosis not present

## 2019-11-23 DIAGNOSIS — Z79818 Long term (current) use of other agents affecting estrogen receptors and estrogen levels: Secondary | ICD-10-CM | POA: Insufficient documentation

## 2019-11-23 DIAGNOSIS — C50911 Malignant neoplasm of unspecified site of right female breast: Secondary | ICD-10-CM

## 2019-11-23 DIAGNOSIS — M1990 Unspecified osteoarthritis, unspecified site: Secondary | ICD-10-CM | POA: Diagnosis not present

## 2019-11-23 DIAGNOSIS — Z170 Estrogen receptor positive status [ER+]: Secondary | ICD-10-CM | POA: Insufficient documentation

## 2019-11-23 DIAGNOSIS — Z950 Presence of cardiac pacemaker: Secondary | ICD-10-CM | POA: Diagnosis not present

## 2019-11-23 LAB — CBC WITH DIFFERENTIAL (CANCER CENTER ONLY)
Abs Immature Granulocytes: 0.03 10*3/uL (ref 0.00–0.07)
Basophils Absolute: 0 10*3/uL (ref 0.0–0.1)
Basophils Relative: 1 %
Eosinophils Absolute: 0.2 10*3/uL (ref 0.0–0.5)
Eosinophils Relative: 4 %
HCT: 31.4 % — ABNORMAL LOW (ref 36.0–46.0)
Hemoglobin: 10.2 g/dL — ABNORMAL LOW (ref 12.0–15.0)
Immature Granulocytes: 1 %
Lymphocytes Relative: 25 %
Lymphs Abs: 1.1 10*3/uL (ref 0.7–4.0)
MCH: 30.1 pg (ref 26.0–34.0)
MCHC: 32.5 g/dL (ref 30.0–36.0)
MCV: 92.6 fL (ref 80.0–100.0)
Monocytes Absolute: 0.5 10*3/uL (ref 0.1–1.0)
Monocytes Relative: 12 %
Neutro Abs: 2.4 10*3/uL (ref 1.7–7.7)
Neutrophils Relative %: 57 %
Platelet Count: 139 10*3/uL — ABNORMAL LOW (ref 150–400)
RBC: 3.39 MIL/uL — ABNORMAL LOW (ref 3.87–5.11)
RDW: 14.7 % (ref 11.5–15.5)
WBC Count: 4.3 10*3/uL (ref 4.0–10.5)
nRBC: 0 % (ref 0.0–0.2)

## 2019-11-23 LAB — CMP (CANCER CENTER ONLY)
ALT: 17 U/L (ref 0–44)
AST: 27 U/L (ref 15–41)
Albumin: 3.3 g/dL — ABNORMAL LOW (ref 3.5–5.0)
Alkaline Phosphatase: 126 U/L (ref 38–126)
Anion gap: 10 (ref 5–15)
BUN: 19 mg/dL (ref 8–23)
CO2: 28 mmol/L (ref 22–32)
Calcium: 9.3 mg/dL (ref 8.9–10.3)
Chloride: 103 mmol/L (ref 98–111)
Creatinine: 1.33 mg/dL — ABNORMAL HIGH (ref 0.44–1.00)
GFR, Est AFR Am: 40 mL/min — ABNORMAL LOW (ref >60)
GFR, Estimated: 35 mL/min — ABNORMAL LOW (ref >60)
Glucose, Bld: 120 mg/dL — ABNORMAL HIGH (ref 70–99)
Potassium: 4.3 mmol/L (ref 3.5–5.1)
Sodium: 141 mmol/L (ref 135–145)
Total Bilirubin: 1.9 mg/dL — ABNORMAL HIGH (ref 0.3–1.2)
Total Protein: 7.3 g/dL (ref 6.5–8.1)

## 2019-11-23 NOTE — Progress Notes (Signed)
Hematology and Oncology Follow Up Visit  Amy White [id number] 11/22/1925 86 y.o. 11/23/2019   Principle Diagnosis:   Metastatic breast cancer-bone only metastasis  Current Therapy:    Aromasin 25 mg p.o. Daily -- d/c on 06/15/2018  Ribociclib 200 mg po q day (21/14) -- changed on 03/27/2018 -- patient stopped on her own in 04/2018  Faslodex 500 mg IM q month - re-start on  09/29/2019   Ibrance 75 mg q day  (14on/14off)-- d/c  Xgeva 120 mg sq q 3 months - on hold permanently due to bad teeth.      Interim History:  Amy White is back for follow-up.  It has been a couple months since we last saw her.  Unfortunately, she apparently got lost coming to the office last time and ended up at the main cancer center.  We have restarted the Faslodex.  She is not tolerant of Ibrance or ribociclib.  I think we also tried her on Aromasin.  She may have progressive through that.  I still think that it is going be her heart that will ultimately be her demise.  She does have cardiac issues.  She does have a pacemaker in.  She is not have any shortness of breath.  She has had a pleural effusion in the past.  She has had no problems with nausea or vomiting.  There is no change in bowel or bladder habits.  She has had no cough.  She has not had the coronavirus vaccine.  I really think she needs to get this given her overall health problems.  I told her that if she were to come down with the coronavirus, I would not be confident she would survive it.  She has had no fever.  There is been no leg swelling.  She has horrible arthritis which is about the same.  Overall, I would say her performance status is probably ECOG 3.     Medications:  Current Outpatient Medications:  .  acetaminophen (TYLENOL) 500 MG tablet, Take 500 mg by mouth every 6 (six) hours as needed for fever., Disp: , Rfl:  .  albuterol (PROVENTIL HFA;VENTOLIN HFA) 108 (90 BASE) MCG/ACT inhaler, Inhale 2 puffs into the lungs  every 6 (six) hours as needed for wheezing or shortness of breath., Disp: , Rfl:  .  ALPRAZolam (XANAX) 0.5 MG tablet, Take 0.25 mg by mouth 2 (two) times daily as needed for anxiety. , Disp: , Rfl: 0 .  bisoprolol (ZEBETA) 5 MG tablet, Take 2 tablets (10 mg total) by mouth daily., Disp: 180 tablet, Rfl: 1 .  cromolyn (OPTICROM) 4 % ophthalmic solution, INT 1 GTT IN OU QID, Disp: , Rfl:  .  diphenoxylate-atropine (LOMOTIL) 2.5-0.025 MG tablet, TAKE 1 TABLET BY MOUTH AFTER EACH LOOSE STOOL, MAXIMUM DAILY DOSE IS 8 TABLETS (Patient not taking: Reported on 05/15/2019), Disp: 100 tablet, Rfl: 0 .  feeding supplement, ENSURE ENLIVE, (ENSURE ENLIVE) LIQD, Take 237 mLs by mouth 2 (two) times daily between meals., Disp: 237 mL, Rfl: 12 .  furosemide (LASIX) 40 MG tablet, Take 20 mg by mouth daily as needed. , Disp: , Rfl:  .  HYDROcodone-acetaminophen (NORCO/VICODIN) 5-325 MG tablet, Take 1 tablet by mouth every 6 (six) hours as needed., Disp: , Rfl:  .  levothyroxine (SYNTHROID, LEVOTHROID) 88 MCG tablet, Take 88 mcg by mouth daily., Disp: , Rfl:  .  oxyCODONE-acetaminophen (PERCOCET/ROXICET) 5-325 MG tablet, Take 1 tablet by mouth every 4 (four) hours as needed for  severe pain (For pain.). , Disp: , Rfl:  .  palbociclib (IBRANCE) 75 MG capsule, Take 1 capsule (75 mg total) by mouth daily with breakfast. Take whole with food. Take for 14 days on, 14 days off, repeat every 28 days. (Patient not taking: Reported on 05/15/2019), Disp: 14 capsule, Rfl: 5 .  polyethylene glycol (MIRALAX / GLYCOLAX) packet, Take 17 g by mouth daily as needed for mild constipation. (Patient not taking: Reported on 05/15/2019), Disp: 14 each, Rfl: 0 .  potassium chloride SA (K-DUR,KLOR-CON) 10 MEQ tablet, Take 2 tablets (20 mEq total) by mouth daily. (Patient not taking: Reported on 05/15/2019), Disp: 60 tablet, Rfl: 6 .  tetrahydrozoline-zinc (VISINE-AC) 0.05-0.25 % ophthalmic solution, Place 2 drops into both eyes 3 (three) times daily  as needed (red eyes)., Disp: , Rfl:  .  UNABLE TO FIND, Hair prosthesis (Patient not taking: Reported on 05/15/2019), Disp: 1 each, Rfl: 0 .  Vitamin D, Ergocalciferol, (DRISDOL) 1.25 MG (50000 UT) CAPS capsule, TAKE 1 CAPSULE BY MOUTH 1 TIME A WEEK, Disp: 12 capsule, Rfl: 3 .  warfarin (COUMADIN) 5 MG tablet, TAKE ONE-HALF TO 1 TABLET BY MOUTH DAILY AS DIRECTED BY COUMADIN CLINIC, Disp: 90 tablet, Rfl: 0  Allergies:  Allergies  Allergen Reactions  . Morphine And Related Rash  . Sulfa Antibiotics Other (See Comments)    Mouth breaks out in sores  . Ciprofloxacin Rash and Other (See Comments)    Rash in the mouth  . Crestor [Rosuvastatin] Other (See Comments)    Unknown reaction  . Penicillins Hives, Rash and Other (See Comments)    Has patient had a PCN reaction causing immediate rash, facial/tongue/throat swelling, SOB or lightheadedness with hypotension: yes Has patient had a PCN reaction causing severe rash involving mucus membranes or skin necrosis: no Has patient had a PCN reaction that required hospitalization: no Has patient had a PCN reaction occurring within the last 10 years: yes If all of the above answers are "NO", then may proceed with Cephalosporin use.   . Vancomycin Rash and Other (See Comments)    "Red-man"    Past Medical History, Surgical history, Social history, and Family History were reviewed and updated.  Review of Systems: Review of Systems  Constitutional: Negative.   HENT:  Negative.   Eyes: Negative.   Respiratory: Negative.   Cardiovascular: Negative.   Gastrointestinal: Negative.   Endocrine: Negative.   Genitourinary: Negative.    Musculoskeletal: Positive for back pain and myalgias.  Skin: Negative.   Neurological: Negative.   Hematological: Negative.   Psychiatric/Behavioral: Negative.     Physical Exam:  weight is 148 lb (67.1 kg). Her temporal temperature is 97.1 F (36.2 C) (abnormal). Her blood pressure is 132/63 and her pulse is 84.  Her respiration is 20 and oxygen saturation is 96%.   Wt Readings from Last 3 Encounters:  11/23/19 148 lb (67.1 kg)  09/29/19 147 lb 1.9 oz (66.7 kg)  09/25/19 140 lb (63.5 kg)    Physical Exam Vitals reviewed.  HENT:     Head: Normocephalic and atraumatic.  Eyes:     Pupils: Pupils are equal, round, and reactive to light.  Cardiovascular:     Rate and Rhythm: Normal rate and regular rhythm.     Heart sounds: Normal heart sounds.     Comments: Cardiac exam is irregular rate and irregular rhythm consistent with atrial fibrillation.  The heart shows no obvious murmurs or rubs. Pulmonary:     Effort: Pulmonary effort is  normal.     Breath sounds: Normal breath sounds.  Abdominal:     General: Bowel sounds are normal.     Palpations: Abdomen is soft.  Musculoskeletal:        General: No tenderness or deformity.     Cervical back: Normal range of motion.     Comments: Extremities shows significant osteoarthritic changes.  She has deformities in her fingers.  She has swelling of her joints.  She has decreased range of motion of quite a few joints.  She has some symmetric weakness in upper and lower extremities.  Lymphadenopathy:     Cervical: No cervical adenopathy.  Skin:    General: Skin is warm and dry.     Findings: No erythema or rash.  Neurological:     Mental Status: She is alert and oriented to person, place, and time.  Psychiatric:        Behavior: Behavior normal.        Thought Content: Thought content normal.        Judgment: Judgment normal.     Lab Results  Component Value Date   WBC 4.3 11/23/2019   HGB 10.2 (L) 11/23/2019   HCT 31.4 (L) 11/23/2019   MCV 92.6 11/23/2019   PLT 139 (L) 11/23/2019     Chemistry      Component Value Date/Time   NA 141 11/23/2019 1458   NA 142 03/22/2018 1638   NA 146 (H) 01/25/2017 1448   NA 142 07/31/2015 1203   K 4.3 11/23/2019 1458   K 4.0 01/25/2017 1448   K 5.1 07/31/2015 1203   CL 103 11/23/2019 1458   CL 108  01/25/2017 1448   CO2 28 11/23/2019 1458   CO2 29 01/25/2017 1448   CO2 23 07/31/2015 1203   BUN 19 11/23/2019 1458   BUN 13 03/22/2018 1638   BUN 18 01/25/2017 1448   BUN 23.3 07/31/2015 1203   CREATININE 1.33 (H) 11/23/2019 1458   CREATININE 1.2 01/25/2017 1448   CREATININE 1.1 07/31/2015 1203      Component Value Date/Time   CALCIUM 9.3 11/23/2019 1458   CALCIUM 9.3 01/25/2017 1448   CALCIUM 9.3 07/31/2015 1203   ALKPHOS 126 11/23/2019 1458   ALKPHOS 64 01/25/2017 1448   ALKPHOS 57 07/31/2015 1203   AST 27 11/23/2019 1458   AST 18 07/31/2015 1203   ALT 17 11/23/2019 1458   ALT 20 01/25/2017 1448   ALT 15 07/31/2015 1203   BILITOT 1.9 (H) 11/23/2019 1458   BILITOT 0.96 07/31/2015 1203      Impression and Plan: Ms. Pinkham is a 86 year old white female.  She has metastatic breast cancer.  It seems as if her disease is still confined to the bones.  Hopefully, we will find that she will respond to the Faslodex.  We will be able to get the Faslodex for her.  I am so happy that our pharmacist, Lattie Haw, is doing a great job with this.  At this really eases the mind of Ms. Dalsanto.  She stopped the Faslodex last time because of the co-pay that she had.  She will be set up to have Faslodex next week.  We will then make sure that we plan to have the Faslodex available when we see her back in a month or so.  We will have to keep in mind that her quality of life is still the primary goal here.     Volanda Napoleon, MD 6/17/20213:57 PM  ADDENDUM: I forgot to mention that I told her that if she does have breathing problems again, I would have to believe that her pleural fluid is coming back.  I told her to let us know so we can set up the thoracentesis and make sure that appropriate studies are done.  Lattie Haw, MD

## 2019-11-24 ENCOUNTER — Other Ambulatory Visit: Payer: Self-pay

## 2019-11-24 ENCOUNTER — Ambulatory Visit (HOSPITAL_COMMUNITY)
Admission: EM | Admit: 2019-11-24 | Discharge: 2019-11-24 | Disposition: A | Discharge status: Home / Self Care | Payer: Medicare Other | Attending: Family Medicine | Admitting: Family Medicine

## 2019-11-24 ENCOUNTER — Telehealth: Payer: Self-pay | Admitting: Hematology & Oncology

## 2019-11-24 ENCOUNTER — Encounter (HOSPITAL_COMMUNITY): Payer: Self-pay | Admitting: Emergency Medicine

## 2019-11-24 DIAGNOSIS — T161XXA Foreign body in right ear, initial encounter: Secondary | ICD-10-CM | POA: Diagnosis not present

## 2019-11-24 LAB — CANCER ANTIGEN 27.29: CA 27.29: 48.5 U/mL — ABNORMAL HIGH (ref 0.0–38.6)

## 2019-11-24 NOTE — ED Triage Notes (Signed)
Q tip stuck in right ear. Clearly visible in external canal
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
## 2019-11-24 NOTE — Telephone Encounter (Signed)
Called and LMVM for patient regarding appointment added for injection on 6/24.  I asked that she call back to confirm date/time per 6/17 los

## 2019-11-30 ENCOUNTER — Other Ambulatory Visit: Payer: Self-pay

## 2019-11-30 ENCOUNTER — Inpatient Hospital Stay: Payer: Medicare Other

## 2019-11-30 VITALS — BP 156/88 | HR 80 | Temp 97.8°F

## 2019-11-30 DIAGNOSIS — Z79899 Other long term (current) drug therapy: Secondary | ICD-10-CM | POA: Diagnosis not present

## 2019-11-30 DIAGNOSIS — Z7901 Long term (current) use of anticoagulants: Secondary | ICD-10-CM | POA: Diagnosis not present

## 2019-11-30 DIAGNOSIS — C50919 Malignant neoplasm of unspecified site of unspecified female breast: Secondary | ICD-10-CM

## 2019-11-30 DIAGNOSIS — C7951 Secondary malignant neoplasm of bone: Secondary | ICD-10-CM | POA: Diagnosis not present

## 2019-11-30 DIAGNOSIS — Z950 Presence of cardiac pacemaker: Secondary | ICD-10-CM | POA: Diagnosis not present

## 2019-11-30 DIAGNOSIS — R531 Weakness: Secondary | ICD-10-CM | POA: Diagnosis not present

## 2019-11-30 DIAGNOSIS — C50911 Malignant neoplasm of unspecified site of right female breast: Secondary | ICD-10-CM | POA: Diagnosis not present

## 2019-11-30 DIAGNOSIS — M1990 Unspecified osteoarthritis, unspecified site: Secondary | ICD-10-CM | POA: Diagnosis not present

## 2019-11-30 DIAGNOSIS — Z170 Estrogen receptor positive status [ER+]: Secondary | ICD-10-CM | POA: Diagnosis not present

## 2019-11-30 DIAGNOSIS — Z79818 Long term (current) use of other agents affecting estrogen receptors and estrogen levels: Secondary | ICD-10-CM | POA: Diagnosis not present

## 2019-11-30 MED ORDER — FULVESTRANT 250 MG/5ML IM SOLN
500.0000 mg | Freq: Once | INTRAMUSCULAR | Status: AC
  Administered 2019-11-30: 500 mg via INTRAMUSCULAR

## 2019-11-30 NOTE — Patient Instructions (Signed)
Fulvestrant injection What is this medicine? FULVESTRANT (ful VES trant) blocks the effects of estrogen. It is used to treat breast cancer. This medicine may be used for other purposes; ask your health care provider or pharmacist if you have questions. COMMON BRAND NAME(S): FASLODEX What should I tell my health care provider before I take this medicine? They need to know if you have any of these conditions:  bleeding disorders  liver disease  low blood counts, like low white cell, platelet, or red cell counts  an unusual or allergic reaction to fulvestrant, other medicines, foods, dyes, or preservatives  pregnant or trying to get pregnant  breast-feeding How should I use this medicine? This medicine is for injection into a muscle. It is usually given by a health care professional in a hospital or clinic setting. Talk to your pediatrician regarding the use of this medicine in children. Special care may be needed. Overdosage: If you think you have taken too much of this medicine contact a poison control center or emergency room at once. NOTE: This medicine is only for you. Do not share this medicine with others. What if I miss a dose? It is important not to miss your dose. Call your doctor or health care professional if you are unable to keep an appointment. What may interact with this medicine?  medicines that treat or prevent blood clots like warfarin, enoxaparin, dalteparin, apixaban, dabigatran, and rivaroxaban This list may not describe all possible interactions. Give your health care provider a list of all the medicines, herbs, non-prescription drugs, or dietary supplements you use. Also tell them if you smoke, drink alcohol, or use illegal drugs. Some items may interact with your medicine. What should I watch for while using this medicine? Your condition will be monitored carefully while you are receiving this medicine. You will need important blood work done while you are taking  this medicine. Do not become pregnant while taking this medicine or for at least 1 year after stopping it. Women of child-bearing potential will need to have a negative pregnancy test before starting this medicine. Women should inform their doctor if they wish to become pregnant or think they might be pregnant. There is a potential for serious side effects to an unborn child. Men should inform their doctors if they wish to father a child. This medicine may lower sperm counts. Talk to your health care professional or pharmacist for more information. Do not breast-feed an infant while taking this medicine or for 1 year after the last dose. What side effects may I notice from receiving this medicine? Side effects that you should report to your doctor or health care professional as soon as possible:  allergic reactions like skin rash, itching or hives, swelling of the face, lips, or tongue  feeling faint or lightheaded, falls  pain, tingling, numbness, or weakness in the legs  signs and symptoms of infection like fever or chills; cough; flu-like symptoms; sore throat  vaginal bleeding Side effects that usually do not require medical attention (report to your doctor or health care professional if they continue or are bothersome):  aches, pains  constipation  diarrhea  headache  hot flashes  nausea, vomiting  pain at site where injected  stomach pain This list may not describe all possible side effects. Call your doctor for medical advice about side effects. You may report side effects to FDA at 1-800-FDA-1088. Where should I keep my medicine? This drug is given in a hospital or clinic and will   not be stored at home. NOTE: This sheet is a summary. It may not cover all possible information. If you have questions about this medicine, talk to your doctor, pharmacist, or health care provider.  2020 Elsevier/Gold Standard (2017-09-02 11:34:41)  

## 2019-12-20 ENCOUNTER — Ambulatory Visit (INDEPENDENT_AMBULATORY_CARE_PROVIDER_SITE_OTHER): Payer: Medicare Other

## 2019-12-20 ENCOUNTER — Other Ambulatory Visit: Payer: Self-pay

## 2019-12-20 DIAGNOSIS — I4891 Unspecified atrial fibrillation: Secondary | ICD-10-CM

## 2019-12-20 DIAGNOSIS — Z7901 Long term (current) use of anticoagulants: Secondary | ICD-10-CM

## 2019-12-20 LAB — POCT INR: INR: 1.9 — AB (ref 2.0–3.0)

## 2019-12-20 NOTE — Patient Instructions (Signed)
Take 1 tablet today and then continue with 1 tablet each Monday and Friday, 1/2 tablet all other days. Repeat INR in 4 weeks.

## 2019-12-28 ENCOUNTER — Other Ambulatory Visit: Payer: Self-pay

## 2019-12-28 ENCOUNTER — Inpatient Hospital Stay: Payer: Medicare Other | Attending: Hematology

## 2019-12-28 ENCOUNTER — Inpatient Hospital Stay (HOSPITAL_BASED_OUTPATIENT_CLINIC_OR_DEPARTMENT_OTHER): Payer: Medicare Other | Admitting: Family

## 2019-12-28 ENCOUNTER — Encounter: Payer: Self-pay | Admitting: Family

## 2019-12-28 ENCOUNTER — Telehealth: Payer: Self-pay | Admitting: Hematology & Oncology

## 2019-12-28 ENCOUNTER — Inpatient Hospital Stay: Payer: Medicare Other

## 2019-12-28 VITALS — BP 143/65 | HR 77 | Temp 98.2°F | Resp 18 | Ht 65.0 in | Wt 146.0 lb

## 2019-12-28 DIAGNOSIS — Z79818 Long term (current) use of other agents affecting estrogen receptors and estrogen levels: Secondary | ICD-10-CM | POA: Insufficient documentation

## 2019-12-28 DIAGNOSIS — C50919 Malignant neoplasm of unspecified site of unspecified female breast: Secondary | ICD-10-CM

## 2019-12-28 DIAGNOSIS — C7951 Secondary malignant neoplasm of bone: Secondary | ICD-10-CM

## 2019-12-28 DIAGNOSIS — Z170 Estrogen receptor positive status [ER+]: Secondary | ICD-10-CM | POA: Insufficient documentation

## 2019-12-28 DIAGNOSIS — M1990 Unspecified osteoarthritis, unspecified site: Secondary | ICD-10-CM | POA: Diagnosis not present

## 2019-12-28 DIAGNOSIS — M7989 Other specified soft tissue disorders: Secondary | ICD-10-CM | POA: Insufficient documentation

## 2019-12-28 DIAGNOSIS — C50911 Malignant neoplasm of unspecified site of right female breast: Secondary | ICD-10-CM | POA: Diagnosis not present

## 2019-12-28 DIAGNOSIS — Z79899 Other long term (current) drug therapy: Secondary | ICD-10-CM | POA: Diagnosis not present

## 2019-12-28 LAB — CBC WITH DIFFERENTIAL (CANCER CENTER ONLY)
Abs Immature Granulocytes: 0.02 10*3/uL (ref 0.00–0.07)
Basophils Absolute: 0 10*3/uL (ref 0.0–0.1)
Basophils Relative: 1 %
Eosinophils Absolute: 0.2 10*3/uL (ref 0.0–0.5)
Eosinophils Relative: 5 %
HCT: 31.2 % — ABNORMAL LOW (ref 36.0–46.0)
Hemoglobin: 10 g/dL — ABNORMAL LOW (ref 12.0–15.0)
Immature Granulocytes: 1 %
Lymphocytes Relative: 23 %
Lymphs Abs: 0.9 10*3/uL (ref 0.7–4.0)
MCH: 29.2 pg (ref 26.0–34.0)
MCHC: 32.1 g/dL (ref 30.0–36.0)
MCV: 91.2 fL (ref 80.0–100.0)
Monocytes Absolute: 0.4 10*3/uL (ref 0.1–1.0)
Monocytes Relative: 11 %
Neutro Abs: 2.3 10*3/uL (ref 1.7–7.7)
Neutrophils Relative %: 59 %
Platelet Count: 133 10*3/uL — ABNORMAL LOW (ref 150–400)
RBC: 3.42 MIL/uL — ABNORMAL LOW (ref 3.87–5.11)
RDW: 15.1 % (ref 11.5–15.5)
WBC Count: 3.9 10*3/uL — ABNORMAL LOW (ref 4.0–10.5)
nRBC: 0 % (ref 0.0–0.2)

## 2019-12-28 LAB — CMP (CANCER CENTER ONLY)
ALT: 12 U/L (ref 0–44)
AST: 22 U/L (ref 15–41)
Albumin: 3.7 g/dL (ref 3.5–5.0)
Alkaline Phosphatase: 125 U/L (ref 38–126)
Anion gap: 6 (ref 5–15)
BUN: 22 mg/dL (ref 8–23)
CO2: 30 mmol/L (ref 22–32)
Calcium: 10.4 mg/dL — ABNORMAL HIGH (ref 8.9–10.3)
Chloride: 106 mmol/L (ref 98–111)
Creatinine: 1.22 mg/dL — ABNORMAL HIGH (ref 0.44–1.00)
GFR, Est AFR Am: 45 mL/min — ABNORMAL LOW (ref >60)
GFR, Estimated: 38 mL/min — ABNORMAL LOW (ref >60)
Glucose, Bld: 109 mg/dL — ABNORMAL HIGH (ref 70–99)
Potassium: 4.2 mmol/L (ref 3.5–5.1)
Sodium: 142 mmol/L (ref 135–145)
Total Bilirubin: 1.7 mg/dL — ABNORMAL HIGH (ref 0.3–1.2)
Total Protein: 7.2 g/dL (ref 6.5–8.1)

## 2019-12-28 MED ORDER — FULVESTRANT 250 MG/5ML IM SOLN
INTRAMUSCULAR | Status: AC
  Filled 2019-12-28: qty 10

## 2019-12-28 MED ORDER — FULVESTRANT 250 MG/5ML IM SOLN
500.0000 mg | Freq: Once | INTRAMUSCULAR | Status: AC
  Administered 2019-12-28: 500 mg via INTRAMUSCULAR

## 2019-12-28 NOTE — Progress Notes (Signed)
Hematology and Oncology Follow Up Visit  MAHITHA HICKLING [id number] July 26, 1925 86 y.o. 12/28/2019   Principle Diagnosis:  Metastatic breast cancer-bone only metastasis  Past Therapy: Aromasin 25 mg p.o. Daily -- d/c on 06/15/2018 Ribociclib 200 mg po q day (21/14) -- changed on 03/27/2018 -- patient stopped on her own in 04/2018 Ibrance 75 mg q day  (14on/14off)-- d/c Xgeva 120 mg sq q 3 months - on hold permanently due to bad teeth.   Current Therapy:        Faslodex 500 mg IM q month - re-start on  09/29/2019   Interim History:  Ms. Tritch is here today for follow-up. She is doing well but still has some arthritic pain in her back that comes and goes. She states that she notes this every 3-4 days and Tylenol has been effective in managing her pain.  She states that she walks for 2 minutes every hour that she is awake for exercise.  She is doing well with Faslodex so far.  No fever, chills, n/v, cough, rash, dizziness, SOB, chest pain, palpitations, abdominal pain or changes in bowel or bladder habits.  She has chronic swelling in her left ankle due to an old surgery. Pedal pulses are 2+.  No tenderness, numbness or tingling in her extremities at this time.  No falls or syncope.  She states that she is eating well and staying properly hydrated. Her weight is stable.   ECOG Performance Status: 1 - Symptomatic but completely ambulatory  Medications:  Allergies as of 12/28/2019      Reactions   Morphine And Related Rash   Sulfa Antibiotics Other (See Comments)   Mouth breaks out in sores   Ciprofloxacin Rash, Other (See Comments)   Rash in the mouth   Crestor [rosuvastatin] Other (See Comments)   Unknown reaction   Penicillins Hives, Rash, Other (See Comments)   Has patient had a PCN reaction causing immediate rash, facial/tongue/throat swelling, SOB or lightheadedness with hypotension: yes Has patient had a PCN reaction causing severe rash involving mucus membranes or skin  necrosis: no Has patient had a PCN reaction that required hospitalization: no Has patient had a PCN reaction occurring within the last 10 years: yes If all of the above answers are "NO", then may proceed with Cephalosporin use.   Vancomycin Rash, Other (See Comments)   "Red-man"      Medication List       Accurate as of December 28, 2019 12:25 PM. If you have any questions, ask your nurse or doctor.        acetaminophen 500 MG tablet Commonly known as: TYLENOL Take 500 mg by mouth every 6 (six) hours as needed for fever.   albuterol 108 (90 Base) MCG/ACT inhaler Commonly known as: VENTOLIN HFA Inhale 2 puffs into the lungs every 6 (six) hours as needed for wheezing or shortness of breath.   ALPRAZolam 0.5 MG tablet Commonly known as: XANAX Take 0.25 mg by mouth 2 (two) times daily as needed for anxiety.   bisoprolol 5 MG tablet Commonly known as: ZEBETA Take 2 tablets (10 mg total) by mouth daily.   cromolyn 4 % ophthalmic solution Commonly known as: OPTICROM INT 1 GTT IN OU QID   diphenoxylate-atropine 2.5-0.025 MG tablet Commonly known as: LOMOTIL TAKE 1 TABLET BY MOUTH AFTER EACH LOOSE STOOL, MAXIMUM DAILY DOSE IS 8 TABLETS   feeding supplement (ENSURE ENLIVE) Liqd Take 237 mLs by mouth 2 (two) times daily between meals.   furosemide  40 MG tablet Commonly known as: LASIX Take 20 mg by mouth daily as needed.   HYDROcodone-acetaminophen 5-325 MG tablet Commonly known as: NORCO/VICODIN Take 1 tablet by mouth every 6 (six) hours as needed.   levothyroxine 88 MCG tablet Commonly known as: SYNTHROID Take 88 mcg by mouth daily.   oxyCODONE-acetaminophen 5-325 MG tablet Commonly known as: PERCOCET/ROXICET Take 1 tablet by mouth every 4 (four) hours as needed for severe pain (For pain.).   palbociclib 75 MG capsule Commonly known as: Ibrance Take 1 capsule (75 mg total) by mouth daily with breakfast. Take whole with food. Take for 14 days on, 14 days off, repeat  every 28 days.   polyethylene glycol 17 g packet Commonly known as: MIRALAX / GLYCOLAX Take 17 g by mouth daily as needed for mild constipation.   potassium chloride 10 MEQ tablet Commonly known as: KLOR-CON Take 2 tablets (20 mEq total) by mouth daily.   tetrahydrozoline-zinc 0.05-0.25 % ophthalmic solution Commonly known as: VISINE-AC Place 2 drops into both eyes 3 (three) times daily as needed (red eyes).   UNABLE TO FIND Hair prosthesis   Vitamin D (Ergocalciferol) 1.25 MG (50000 UNIT) Caps capsule Commonly known as: DRISDOL TAKE 1 CAPSULE BY MOUTH 1 TIME A WEEK   warfarin 5 MG tablet Commonly known as: COUMADIN Take as directed by the anticoagulation clinic. If you are unsure how to take this medication, talk to your nurse or doctor. Original instructions: TAKE ONE-HALF TO 1 TABLET BY MOUTH DAILY AS DIRECTED BY COUMADIN CLINIC       Allergies:  Allergies  Allergen Reactions  . Morphine And Related Rash  . Sulfa Antibiotics Other (See Comments)    Mouth breaks out in sores  . Ciprofloxacin Rash and Other (See Comments)    Rash in the mouth  . Crestor [Rosuvastatin] Other (See Comments)    Unknown reaction  . Penicillins Hives, Rash and Other (See Comments)    Has patient had a PCN reaction causing immediate rash, facial/tongue/throat swelling, SOB or lightheadedness with hypotension: yes Has patient had a PCN reaction causing severe rash involving mucus membranes or skin necrosis: no Has patient had a PCN reaction that required hospitalization: no Has patient had a PCN reaction occurring within the last 10 years: yes If all of the above answers are "NO", then may proceed with Cephalosporin use.   . Vancomycin Rash and Other (See Comments)    "Red-man"    Past Medical History, Surgical history, Social history, and Family History were reviewed and updated.  Review of Systems: All other 10 point review of systems is negative.   Physical Exam:  height is 5\' 5"   (1.651 m) and weight is 146 lb 0.6 oz (66.2 kg). Her oral temperature is 98.2 F (36.8 C). Her blood pressure is 143/65 (abnormal) and her pulse is 77. Her respiration is 18 and oxygen saturation is 96%.   Wt Readings from Last 3 Encounters:  12/28/19 146 lb 0.6 oz (66.2 kg)  11/23/19 148 lb (67.1 kg)  09/29/19 147 lb 1.9 oz (66.7 kg)    Ocular: Sclerae unicteric, pupils equal, round and reactive to light Ear-nose-throat: Oropharynx clear, dentition fair Lymphatic: No cervical or supraclavicular adenopathy Lungs no rales or rhonchi, good excursion bilaterally Heart irregular rate and rhythm, atrial fib, controlled, no murmur appreciated Abd soft, nontender, positive bowel sounds, no liver or spleen tip palpated on exam, no fluid wave  MSK no focal spinal tenderness, no joint edema Neuro: non-focal, well-oriented, appropriate  affect Breasts: Deferred   Lab Results  Component Value Date   WBC 3.9 (L) 12/28/2019   HGB 10.0 (L) 12/28/2019   HCT 31.2 (L) 12/28/2019   MCV 91.2 12/28/2019   PLT 133 (L) 12/28/2019   Lab Results  Component Value Date   FERRITIN 52 07/25/2018   IRON 78 07/25/2018   TIBC 359 07/25/2018   UIBC 280 07/25/2018   IRONPCTSAT 22 07/25/2018   Lab Results  Component Value Date   RBC 3.42 (L) 12/28/2019   No results found for: KPAFRELGTCHN, LAMBDASER, KAPLAMBRATIO No results found for: IGGSERUM, IGA, IGMSERUM No results found for: Odetta Pink, SPEI   Chemistry      Component Value Date/Time   NA 141 11/23/2019 1458   NA 142 03/22/2018 1638   NA 146 (H) 01/25/2017 1448   NA 142 07/31/2015 1203   K 4.3 11/23/2019 1458   K 4.0 01/25/2017 1448   K 5.1 07/31/2015 1203   CL 103 11/23/2019 1458   CL 108 01/25/2017 1448   CO2 28 11/23/2019 1458   CO2 29 01/25/2017 1448   CO2 23 07/31/2015 1203   BUN 19 11/23/2019 1458   BUN 13 03/22/2018 1638   BUN 18 01/25/2017 1448   BUN 23.3 07/31/2015 1203    CREATININE 1.33 (H) 11/23/2019 1458   CREATININE 1.2 01/25/2017 1448   CREATININE 1.1 07/31/2015 1203      Component Value Date/Time   CALCIUM 9.3 11/23/2019 1458   CALCIUM 9.3 01/25/2017 1448   CALCIUM 9.3 07/31/2015 1203   ALKPHOS 126 11/23/2019 1458   ALKPHOS 64 01/25/2017 1448   ALKPHOS 57 07/31/2015 1203   AST 27 11/23/2019 1458   AST 18 07/31/2015 1203   ALT 17 11/23/2019 1458   ALT 20 01/25/2017 1448   ALT 15 07/31/2015 1203   BILITOT 1.9 (H) 11/23/2019 1458   BILITOT 0.96 07/31/2015 1203       Impression and Plan: Ms. Danford is a very pleasant 86 yo caucasian female with metastatic breast cancer still confined to the bones.  She is doing well on Faslodex so far. We will proceed with injections today as planned.  CA 27.29 last month was 48.5 (previously 57.5). Today's result is pending.  We will plan to see her again in a month.  She can contact our office with any questions or concerns. We can certainly see her sooner if needed.   Laverna Peace, NP 7/22/202112:25 PM

## 2019-12-28 NOTE — Patient Instructions (Signed)
Fulvestrant injection What is this medicine? FULVESTRANT (ful VES trant) blocks the effects of estrogen. It is used to treat breast cancer. This medicine may be used for other purposes; ask your health care provider or pharmacist if you have questions. COMMON BRAND NAME(S): FASLODEX What should I tell my health care provider before I take this medicine? They need to know if you have any of these conditions:  bleeding disorders  liver disease  low blood counts, like low white cell, platelet, or red cell counts  an unusual or allergic reaction to fulvestrant, other medicines, foods, dyes, or preservatives  pregnant or trying to get pregnant  breast-feeding How should I use this medicine? This medicine is for injection into a muscle. It is usually given by a health care professional in a hospital or clinic setting. Talk to your pediatrician regarding the use of this medicine in children. Special care may be needed. Overdosage: If you think you have taken too much of this medicine contact a poison control center or emergency room at once. NOTE: This medicine is only for you. Do not share this medicine with others. What if I miss a dose? It is important not to miss your dose. Call your doctor or health care professional if you are unable to keep an appointment. What may interact with this medicine?  medicines that treat or prevent blood clots like warfarin, enoxaparin, dalteparin, apixaban, dabigatran, and rivaroxaban This list may not describe all possible interactions. Give your health care provider a list of all the medicines, herbs, non-prescription drugs, or dietary supplements you use. Also tell them if you smoke, drink alcohol, or use illegal drugs. Some items may interact with your medicine. What should I watch for while using this medicine? Your condition will be monitored carefully while you are receiving this medicine. You will need important blood work done while you are taking  this medicine. Do not become pregnant while taking this medicine or for at least 1 year after stopping it. Women of child-bearing potential will need to have a negative pregnancy test before starting this medicine. Women should inform their doctor if they wish to become pregnant or think they might be pregnant. There is a potential for serious side effects to an unborn child. Men should inform their doctors if they wish to father a child. This medicine may lower sperm counts. Talk to your health care professional or pharmacist for more information. Do not breast-feed an infant while taking this medicine or for 1 year after the last dose. What side effects may I notice from receiving this medicine? Side effects that you should report to your doctor or health care professional as soon as possible:  allergic reactions like skin rash, itching or hives, swelling of the face, lips, or tongue  feeling faint or lightheaded, falls  pain, tingling, numbness, or weakness in the legs  signs and symptoms of infection like fever or chills; cough; flu-like symptoms; sore throat  vaginal bleeding Side effects that usually do not require medical attention (report to your doctor or health care professional if they continue or are bothersome):  aches, pains  constipation  diarrhea  headache  hot flashes  nausea, vomiting  pain at site where injected  stomach pain This list may not describe all possible side effects. Call your doctor for medical advice about side effects. You may report side effects to FDA at 1-800-FDA-1088. Where should I keep my medicine? This drug is given in a hospital or clinic and will   not be stored at home. NOTE: This sheet is a summary. It may not cover all possible information. If you have questions about this medicine, talk to your doctor, pharmacist, or health care provider.  2020 Elsevier/Gold Standard (2017-09-02 11:34:41)  

## 2019-12-28 NOTE — Telephone Encounter (Signed)
Appointments scheduled calendar printed & mailed per 7/22 los

## 2019-12-29 LAB — CANCER ANTIGEN 27.29: CA 27.29: 62.4 U/mL — ABNORMAL HIGH (ref 0.0–38.6)

## 2020-01-01 ENCOUNTER — Other Ambulatory Visit: Payer: Self-pay

## 2020-01-01 MED ORDER — BISOPROLOL FUMARATE 5 MG PO TABS: 10.0000 mg | ORAL_TABLET | Freq: Every day | ORAL | 1 refills | Status: AC

## 2020-01-16 ENCOUNTER — Ambulatory Visit (INDEPENDENT_AMBULATORY_CARE_PROVIDER_SITE_OTHER): Payer: Medicare Other | Admitting: *Deleted

## 2020-01-16 DIAGNOSIS — I495 Sick sinus syndrome: Secondary | ICD-10-CM

## 2020-01-16 LAB — CUP PACEART REMOTE DEVICE CHECK
Battery Remaining Longevity: 36 mo
Battery Voltage: 2.97 V
Brady Statistic AP VP Percent: 0 %
Brady Statistic AP VS Percent: 0 %
Brady Statistic AS VP Percent: 29.95 %
Brady Statistic AS VS Percent: 70.05 %
Brady Statistic RA Percent Paced: 0 %
Brady Statistic RV Percent Paced: 32.87 %
Date Time Interrogation Session: 20210810100234
Implantable Lead Implant Date: 20160223
Implantable Lead Implant Date: 20160223
Implantable Lead Location: 753859
Implantable Lead Location: 753860
Implantable Lead Model: 5076
Implantable Lead Model: 5076
Implantable Pulse Generator Implant Date: 20160223
Lead Channel Impedance Value: 304 Ohm
Lead Channel Impedance Value: 342 Ohm
Lead Channel Impedance Value: 380 Ohm
Lead Channel Impedance Value: 513 Ohm
Lead Channel Pacing Threshold Amplitude: 0.75 V
Lead Channel Pacing Threshold Amplitude: 0.875 V
Lead Channel Pacing Threshold Pulse Width: 0.4 ms
Lead Channel Pacing Threshold Pulse Width: 0.4 ms
Lead Channel Sensing Intrinsic Amplitude: 1.125 mV
Lead Channel Sensing Intrinsic Amplitude: 1.125 mV
Lead Channel Sensing Intrinsic Amplitude: 10.125 mV
Lead Channel Sensing Intrinsic Amplitude: 10.125 mV
Lead Channel Setting Pacing Amplitude: 2.25 V
Lead Channel Setting Pacing Pulse Width: 0.4 ms
Lead Channel Setting Sensing Sensitivity: 0.9 mV

## 2020-01-17 ENCOUNTER — Ambulatory Visit (INDEPENDENT_AMBULATORY_CARE_PROVIDER_SITE_OTHER): Payer: Medicare Other

## 2020-01-17 ENCOUNTER — Other Ambulatory Visit: Payer: Self-pay

## 2020-01-17 DIAGNOSIS — I4891 Unspecified atrial fibrillation: Secondary | ICD-10-CM | POA: Diagnosis not present

## 2020-01-17 DIAGNOSIS — Z7901 Long term (current) use of anticoagulants: Secondary | ICD-10-CM | POA: Diagnosis not present

## 2020-01-17 LAB — POCT INR: INR: 2.2 (ref 2.0–3.0)

## 2020-01-17 NOTE — Progress Notes (Signed)
Remote pacemaker transmission.   

## 2020-01-17 NOTE — Patient Instructions (Signed)
continue with 1 tablet each Monday and Friday, 1/2 tablet all other days. Repeat INR in 6 weeks.

## 2020-01-29 ENCOUNTER — Inpatient Hospital Stay: Payer: Medicare Other | Attending: Hematology

## 2020-01-29 ENCOUNTER — Telehealth: Payer: Self-pay | Admitting: Family

## 2020-01-29 ENCOUNTER — Inpatient Hospital Stay (HOSPITAL_BASED_OUTPATIENT_CLINIC_OR_DEPARTMENT_OTHER): Payer: Medicare Other | Admitting: Family

## 2020-01-29 ENCOUNTER — Inpatient Hospital Stay: Payer: Medicare Other

## 2020-01-29 VITALS — BP 157/66 | HR 82 | Temp 97.1°F | Resp 18

## 2020-01-29 DIAGNOSIS — C7951 Secondary malignant neoplasm of bone: Secondary | ICD-10-CM

## 2020-01-29 DIAGNOSIS — C50911 Malignant neoplasm of unspecified site of right female breast: Secondary | ICD-10-CM | POA: Insufficient documentation

## 2020-01-29 DIAGNOSIS — C50919 Malignant neoplasm of unspecified site of unspecified female breast: Secondary | ICD-10-CM

## 2020-01-29 DIAGNOSIS — D508 Other iron deficiency anemias: Secondary | ICD-10-CM

## 2020-01-29 DIAGNOSIS — Z5111 Encounter for antineoplastic chemotherapy: Secondary | ICD-10-CM | POA: Insufficient documentation

## 2020-01-29 LAB — CBC WITH DIFFERENTIAL (CANCER CENTER ONLY)
Abs Immature Granulocytes: 0.02 10*3/uL (ref 0.00–0.07)
Basophils Absolute: 0 10*3/uL (ref 0.0–0.1)
Basophils Relative: 1 %
Eosinophils Absolute: 0.2 10*3/uL (ref 0.0–0.5)
Eosinophils Relative: 4 %
HCT: 30.5 % — ABNORMAL LOW (ref 36.0–46.0)
Hemoglobin: 9.7 g/dL — ABNORMAL LOW (ref 12.0–15.0)
Immature Granulocytes: 1 %
Lymphocytes Relative: 24 %
Lymphs Abs: 1 10*3/uL (ref 0.7–4.0)
MCH: 29.8 pg (ref 26.0–34.0)
MCHC: 31.8 g/dL (ref 30.0–36.0)
MCV: 93.6 fL (ref 80.0–100.0)
Monocytes Absolute: 0.5 10*3/uL (ref 0.1–1.0)
Monocytes Relative: 11 %
Neutro Abs: 2.5 10*3/uL (ref 1.7–7.7)
Neutrophils Relative %: 59 %
Platelet Count: 143 10*3/uL — ABNORMAL LOW (ref 150–400)
RBC: 3.26 MIL/uL — ABNORMAL LOW (ref 3.87–5.11)
RDW: 15.7 % — ABNORMAL HIGH (ref 11.5–15.5)
WBC Count: 4.3 10*3/uL (ref 4.0–10.5)
nRBC: 0 % (ref 0.0–0.2)

## 2020-01-29 LAB — CMP (CANCER CENTER ONLY)
ALT: 13 U/L (ref 0–44)
AST: 21 U/L (ref 15–41)
Albumin: 3.5 g/dL (ref 3.5–5.0)
Alkaline Phosphatase: 108 U/L (ref 38–126)
Anion gap: 6 (ref 5–15)
BUN: 20 mg/dL (ref 8–23)
CO2: 31 mmol/L (ref 22–32)
Calcium: 10.1 mg/dL (ref 8.9–10.3)
Chloride: 106 mmol/L (ref 98–111)
Creatinine: 1.27 mg/dL — ABNORMAL HIGH (ref 0.44–1.00)
GFR, Est AFR Am: 42 mL/min — ABNORMAL LOW (ref >60)
GFR, Estimated: 37 mL/min — ABNORMAL LOW (ref >60)
Glucose, Bld: 139 mg/dL — ABNORMAL HIGH (ref 70–99)
Potassium: 4.4 mmol/L (ref 3.5–5.1)
Sodium: 143 mmol/L (ref 135–145)
Total Bilirubin: 2 mg/dL — ABNORMAL HIGH (ref 0.3–1.2)
Total Protein: 7.2 g/dL (ref 6.5–8.1)

## 2020-01-29 MED ORDER — FULVESTRANT 250 MG/5ML IM SOLN
INTRAMUSCULAR | Status: AC
  Filled 2020-01-29: qty 10

## 2020-01-29 MED ORDER — FULVESTRANT 250 MG/5ML IM SOLN
500.0000 mg | Freq: Once | INTRAMUSCULAR | Status: AC
  Administered 2020-01-29: 500 mg via INTRAMUSCULAR

## 2020-01-29 NOTE — Progress Notes (Signed)
Hematology and Oncology Follow Up Visit  Amy White [id number] [birth year] 86 y.o. 01/29/2020   Principle Diagnosis:  Metastatic breast cancer - bone only metastasis  Past Therapy: Aromasin 25 mg p.o. Daily -- d/c on 06/15/2018 Ribociclib 200 mg po q day (21/14) -- changed on 03/27/2018 -- patient stopped on her own in 04/2018 Ibrance 75 mg q day (14on/14off)-- d/c Xgeva 120 mg sq q 3 months - on hold permanently due to bad teeth.   Current Therapy: Faslodex 500 mg IM q month - re-start on 09/29/2019    Interim History:  Amy White is here today for follow-up and Faslodex. She states that she sometimes gets a "squirting" feeling throughout there body since being back on the Faslodex. She states that this does not hurt just startles her. These episodes are random and only last a few seconds.  CA 27.29 last month was 62.4. Today's result is pending.  No c/o pain at this time.  No fever, chills, n/v, cough, rash, dizziness, chest pain, palpitations, abdominal pain or changes in bowel or bladder habits.  She has occasional SOB with over exertion with COPD. She will take a break to rest as needed.  No episodes of bleeding. No bruising or petechiae.  No tenderness, numbness or tingling in her extremities.  Her left ankle swells slightly due to an old injury. Salt also causes swelling on her ankles so she avoids.  No falls or syncope.  She has maintained a good appetite and is staying well hydrated. Her weight is stable.   ECOG Performance Status: 1 - Symptomatic but completely ambulatory  Medications:  Allergies as of 01/29/2020      Reactions   Morphine And Related Rash   Sulfa Antibiotics Other (See Comments)   Mouth breaks out in sores   Ciprofloxacin Rash, Other (See Comments)   Rash in the mouth   Crestor [rosuvastatin] Other (See Comments)   Unknown reaction   Penicillins Hives, Rash, Other (See Comments)   Has patient had a PCN reaction causing immediate  rash, facial/tongue/throat swelling, SOB or lightheadedness with hypotension: yes Has patient had a PCN reaction causing severe rash involving mucus membranes or skin necrosis: no Has patient had a PCN reaction that required hospitalization: no Has patient had a PCN reaction occurring within the last 10 years: yes If all of the above answers are "NO", then may proceed with Cephalosporin use.   Vancomycin Rash, Other (See Comments)   "Red-man"      Medication List       Accurate as of January 29, 2020  1:58 PM. If you have any questions, ask your nurse or doctor.        acetaminophen 500 MG tablet Commonly known as: TYLENOL Take 500 mg by mouth every 6 (six) hours as needed for fever.   albuterol 108 (90 Base) MCG/ACT inhaler Commonly known as: VENTOLIN HFA Inhale 2 puffs into the lungs every 6 (six) hours as needed for wheezing or shortness of breath.   ALPRAZolam 0.5 MG tablet Commonly known as: XANAX Take 0.25 mg by mouth 2 (two) times daily as needed for anxiety.   bisoprolol 5 MG tablet Commonly known as: ZEBETA Take 2 tablets (10 mg total) by mouth daily.   cromolyn 4 % ophthalmic solution Commonly known as: OPTICROM INT 1 GTT IN OU QID   diphenoxylate-atropine 2.5-0.025 MG tablet Commonly known as: LOMOTIL TAKE 1 TABLET BY MOUTH AFTER EACH LOOSE STOOL, MAXIMUM DAILY DOSE IS 8 TABLETS  feeding supplement (ENSURE ENLIVE) Liqd Take 237 mLs by mouth 2 (two) times daily between meals.   furosemide 40 MG tablet Commonly known as: LASIX Take 20 mg by mouth daily as needed.   HYDROcodone-acetaminophen 5-325 MG tablet Commonly known as: NORCO/VICODIN Take 1 tablet by mouth every 6 (six) hours as needed.   levothyroxine 88 MCG tablet Commonly known as: SYNTHROID Take 88 mcg by mouth daily.   oxyCODONE-acetaminophen 5-325 MG tablet Commonly known as: PERCOCET/ROXICET Take 1 tablet by mouth every 4 (four) hours as needed for severe pain (For pain.).   palbociclib  75 MG capsule Commonly known as: Ibrance Take 1 capsule (75 mg total) by mouth daily with breakfast. Take whole with food. Take for 14 days on, 14 days off, repeat every 28 days.   polyethylene glycol 17 g packet Commonly known as: MIRALAX / GLYCOLAX Take 17 g by mouth daily as needed for mild constipation.   potassium chloride 10 MEQ tablet Commonly known as: KLOR-CON Take 2 tablets (20 mEq total) by mouth daily.   tetrahydrozoline-zinc 0.05-0.25 % ophthalmic solution Commonly known as: VISINE-AC Place 2 drops into both eyes 3 (three) times daily as needed (red eyes).   UNABLE TO FIND Hair prosthesis   Vitamin D (Ergocalciferol) 1.25 MG (50000 UNIT) Caps capsule Commonly known as: DRISDOL TAKE 1 CAPSULE BY MOUTH 1 TIME A WEEK   warfarin 5 MG tablet Commonly known as: COUMADIN Take as directed by the anticoagulation clinic. If you are unsure how to take this medication, talk to your nurse or doctor. Original instructions: TAKE ONE-HALF TO 1 TABLET BY MOUTH DAILY AS DIRECTED BY COUMADIN CLINIC       Allergies:  Allergies  Allergen Reactions  . Morphine And Related Rash  . Sulfa Antibiotics Other (See Comments)    Mouth breaks out in sores  . Ciprofloxacin Rash and Other (See Comments)    Rash in the mouth  . Crestor [Rosuvastatin] Other (See Comments)    Unknown reaction  . Penicillins Hives, Rash and Other (See Comments)    Has patient had a PCN reaction causing immediate rash, facial/tongue/throat swelling, SOB or lightheadedness with hypotension: yes Has patient had a PCN reaction causing severe rash involving mucus membranes or skin necrosis: no Has patient had a PCN reaction that required hospitalization: no Has patient had a PCN reaction occurring within the last 10 years: yes If all of the above answers are "NO", then may proceed with Cephalosporin use.   . Vancomycin Rash and Other (See Comments)    "Red-man"    Past Medical History, Surgical history, Social  history, and Family History were reviewed and updated.  Review of Systems: All other 10 point review of systems is negative.   Physical Exam:  vitals were not taken for this visit.   Wt Readings from Last 3 Encounters:  12/28/19 146 lb 0.6 oz (66.2 kg)  11/23/19 148 lb (67.1 kg)  09/29/19 147 lb 1.9 oz (66.7 kg)    Ocular: Sclerae unicteric, pupils equal, round and reactive to light Ear-nose-throat: Oropharynx clear, dentition fair Lymphatic: No cervical or supraclavicular adenopathy Lungs no rales or rhonchi, good excursion bilaterally Heart regular rate and rhythm, no murmur appreciated Abd soft, nontender, positive bowel sounds, no liver or spleen tip palpated on exam, no fluid wave  MSK no focal spinal tenderness, no joint edema Neuro: non-focal, well-oriented, appropriate affect Breasts: Deferred   Lab Results  Component Value Date   WBC 4.3 01/29/2020   HGB 9.7 (  L) 01/29/2020   HCT 30.5 (L) 01/29/2020   MCV 93.6 01/29/2020   PLT 143 (L) 01/29/2020   Lab Results  Component Value Date   FERRITIN 52 07/25/2018   IRON 78 07/25/2018   TIBC 359 07/25/2018   UIBC 280 07/25/2018   IRONPCTSAT 22 07/25/2018   Lab Results  Component Value Date   RBC 3.26 (L) 01/29/2020   No results found for: KPAFRELGTCHN, LAMBDASER, KAPLAMBRATIO No results found for: Kandis Cocking, IGMSERUM No results found for: Odetta Pink, SPEI   Chemistry      Component Value Date/Time   NA 142 12/28/2019 1155   NA 142 03/22/2018 1638   NA 146 (H) 01/25/2017 1448   NA 142 07/31/2015 1203   K 4.2 12/28/2019 1155   K 4.0 01/25/2017 1448   K 5.1 07/31/2015 1203   CL 106 12/28/2019 1155   CL 108 01/25/2017 1448   CO2 30 12/28/2019 1155   CO2 29 01/25/2017 1448   CO2 23 07/31/2015 1203   BUN 22 12/28/2019 1155   BUN 13 03/22/2018 1638   BUN 18 01/25/2017 1448   BUN 23.3 07/31/2015 1203   CREATININE 1.22 (H) 12/28/2019 1155   CREATININE  1.2 01/25/2017 1448   CREATININE 1.1 07/31/2015 1203      Component Value Date/Time   CALCIUM 10.4 (H) 12/28/2019 1155   CALCIUM 9.3 01/25/2017 1448   CALCIUM 9.3 07/31/2015 1203   ALKPHOS 125 12/28/2019 1155   ALKPHOS 64 01/25/2017 1448   ALKPHOS 57 07/31/2015 1203   AST 22 12/28/2019 1155   AST 18 07/31/2015 1203   ALT 12 12/28/2019 1155   ALT 20 01/25/2017 1448   ALT 15 07/31/2015 1203   BILITOT 1.7 (H) 12/28/2019 1155   BILITOT 0.96 07/31/2015 1203       Impression and Plan: Ms. Gehling is a very pleasant 86 yo caucasian female with metastatic breast cancer still confined to the bones.  Her CEA last month was up a but at 62.4. I did discuss with MD today and for now we will just watch. Today's result is pending.  She received Faslodex today as planned.  We will see her again in a month.  She can contact our office with any questions or concerns.   Laverna Peace, NP 8/23/20211:58 PM

## 2020-01-29 NOTE — Telephone Encounter (Signed)
Appointments scheduled calendar printed per 8/23 los

## 2020-01-29 NOTE — Patient Instructions (Signed)
Fulvestrant injection What is this medicine? FULVESTRANT (ful VES trant) blocks the effects of estrogen. It is used to treat breast cancer. This medicine may be used for other purposes; ask your health care provider or pharmacist if you have questions. COMMON BRAND NAME(S): FASLODEX What should I tell my health care provider before I take this medicine? They need to know if you have any of these conditions:  bleeding disorders  liver disease  low blood counts, like low white cell, platelet, or red cell counts  an unusual or allergic reaction to fulvestrant, other medicines, foods, dyes, or preservatives  pregnant or trying to get pregnant  breast-feeding How should I use this medicine? This medicine is for injection into a muscle. It is usually given by a health care professional in a hospital or clinic setting. Talk to your pediatrician regarding the use of this medicine in children. Special care may be needed. Overdosage: If you think you have taken too much of this medicine contact a poison control center or emergency room at once. NOTE: This medicine is only for you. Do not share this medicine with others. What if I miss a dose? It is important not to miss your dose. Call your doctor or health care professional if you are unable to keep an appointment. What may interact with this medicine?  medicines that treat or prevent blood clots like warfarin, enoxaparin, dalteparin, apixaban, dabigatran, and rivaroxaban This list may not describe all possible interactions. Give your health care provider a list of all the medicines, herbs, non-prescription drugs, or dietary supplements you use. Also tell them if you smoke, drink alcohol, or use illegal drugs. Some items may interact with your medicine. What should I watch for while using this medicine? Your condition will be monitored carefully while you are receiving this medicine. You will need important blood work done while you are taking  this medicine. Do not become pregnant while taking this medicine or for at least 1 year after stopping it. Women of child-bearing potential will need to have a negative pregnancy test before starting this medicine. Women should inform their doctor if they wish to become pregnant or think they might be pregnant. There is a potential for serious side effects to an unborn child. Men should inform their doctors if they wish to father a child. This medicine may lower sperm counts. Talk to your health care professional or pharmacist for more information. Do not breast-feed an infant while taking this medicine or for 1 year after the last dose. What side effects may I notice from receiving this medicine? Side effects that you should report to your doctor or health care professional as soon as possible:  allergic reactions like skin rash, itching or hives, swelling of the face, lips, or tongue  feeling faint or lightheaded, falls  pain, tingling, numbness, or weakness in the legs  signs and symptoms of infection like fever or chills; cough; flu-like symptoms; sore throat  vaginal bleeding Side effects that usually do not require medical attention (report to your doctor or health care professional if they continue or are bothersome):  aches, pains  constipation  diarrhea  headache  hot flashes  nausea, vomiting  pain at site where injected  stomach pain This list may not describe all possible side effects. Call your doctor for medical advice about side effects. You may report side effects to FDA at 1-800-FDA-1088. Where should I keep my medicine? This drug is given in a hospital or clinic and will   not be stored at home. NOTE: This sheet is a summary. It may not cover all possible information. If you have questions about this medicine, talk to your doctor, pharmacist, or health care provider.  2020 Elsevier/Gold Standard (2017-09-02 11:34:41)  

## 2020-01-30 LAB — CANCER ANTIGEN 27.29: CA 27.29: 60.8 U/mL — ABNORMAL HIGH (ref 0.0–38.6)

## 2020-02-27 ENCOUNTER — Other Ambulatory Visit: Payer: Self-pay | Admitting: Hematology & Oncology

## 2020-02-27 DIAGNOSIS — E559 Vitamin D deficiency, unspecified: Secondary | ICD-10-CM

## 2020-03-04 ENCOUNTER — Telehealth: Payer: Self-pay

## 2020-03-04 ENCOUNTER — Other Ambulatory Visit: Payer: Self-pay

## 2020-03-04 MED ORDER — WARFARIN SODIUM 5 MG PO TABS: ORAL_TABLET | ORAL | 0 refills | Status: DC

## 2020-03-04 NOTE — Telephone Encounter (Signed)
lmom for overdue inr 

## 2020-03-04 NOTE — Telephone Encounter (Signed)
-----   Message from Harrington Challenger, Taylor sent at 03/01/2020  8:51 AM EDT ----- Regarding: [place] INR Please re-schedule INR check. Looks like she forgot her appoinment

## 2020-03-06 ENCOUNTER — Inpatient Hospital Stay: Payer: Medicare Other

## 2020-03-06 ENCOUNTER — Inpatient Hospital Stay (HOSPITAL_BASED_OUTPATIENT_CLINIC_OR_DEPARTMENT_OTHER): Payer: Medicare Other | Admitting: Hematology & Oncology

## 2020-03-06 ENCOUNTER — Inpatient Hospital Stay: Payer: Medicare Other | Attending: Hematology

## 2020-03-06 ENCOUNTER — Encounter: Payer: Self-pay | Admitting: Hematology & Oncology

## 2020-03-06 ENCOUNTER — Other Ambulatory Visit: Payer: Self-pay

## 2020-03-06 ENCOUNTER — Telehealth: Payer: Self-pay | Admitting: Hematology & Oncology

## 2020-03-06 VITALS — BP 121/58 | HR 69 | Temp 97.9°F | Resp 18 | Ht 65.0 in | Wt 146.8 lb

## 2020-03-06 DIAGNOSIS — M1990 Unspecified osteoarthritis, unspecified site: Secondary | ICD-10-CM | POA: Insufficient documentation

## 2020-03-06 DIAGNOSIS — I4891 Unspecified atrial fibrillation: Secondary | ICD-10-CM | POA: Diagnosis not present

## 2020-03-06 DIAGNOSIS — C7951 Secondary malignant neoplasm of bone: Secondary | ICD-10-CM

## 2020-03-06 DIAGNOSIS — R5381 Other malaise: Secondary | ICD-10-CM | POA: Diagnosis not present

## 2020-03-06 DIAGNOSIS — Z170 Estrogen receptor positive status [ER+]: Secondary | ICD-10-CM | POA: Diagnosis not present

## 2020-03-06 DIAGNOSIS — Z79899 Other long term (current) drug therapy: Secondary | ICD-10-CM | POA: Insufficient documentation

## 2020-03-06 DIAGNOSIS — Z79811 Long term (current) use of aromatase inhibitors: Secondary | ICD-10-CM | POA: Diagnosis not present

## 2020-03-06 DIAGNOSIS — C50911 Malignant neoplasm of unspecified site of right female breast: Secondary | ICD-10-CM | POA: Diagnosis not present

## 2020-03-06 DIAGNOSIS — R5383 Other fatigue: Secondary | ICD-10-CM | POA: Diagnosis not present

## 2020-03-06 DIAGNOSIS — R079 Chest pain, unspecified: Secondary | ICD-10-CM | POA: Diagnosis not present

## 2020-03-06 DIAGNOSIS — C50919 Malignant neoplasm of unspecified site of unspecified female breast: Secondary | ICD-10-CM

## 2020-03-06 DIAGNOSIS — D508 Other iron deficiency anemias: Secondary | ICD-10-CM

## 2020-03-06 LAB — CBC WITH DIFFERENTIAL (CANCER CENTER ONLY)
Abs Immature Granulocytes: 0.02 10*3/uL (ref 0.00–0.07)
Basophils Absolute: 0.1 10*3/uL (ref 0.0–0.1)
Basophils Relative: 1 %
Eosinophils Absolute: 0.2 10*3/uL (ref 0.0–0.5)
Eosinophils Relative: 5 %
HCT: 30.9 % — ABNORMAL LOW (ref 36.0–46.0)
Hemoglobin: 9.7 g/dL — ABNORMAL LOW (ref 12.0–15.0)
Immature Granulocytes: 1 %
Lymphocytes Relative: 24 %
Lymphs Abs: 1 10*3/uL (ref 0.7–4.0)
MCH: 29.9 pg (ref 26.0–34.0)
MCHC: 31.4 g/dL (ref 30.0–36.0)
MCV: 95.4 fL (ref 80.0–100.0)
Monocytes Absolute: 0.4 10*3/uL (ref 0.1–1.0)
Monocytes Relative: 10 %
Neutro Abs: 2.6 10*3/uL (ref 1.7–7.7)
Neutrophils Relative %: 59 %
Platelet Count: 138 10*3/uL — ABNORMAL LOW (ref 150–400)
RBC: 3.24 MIL/uL — ABNORMAL LOW (ref 3.87–5.11)
RDW: 15.7 % — ABNORMAL HIGH (ref 11.5–15.5)
WBC Count: 4.3 10*3/uL (ref 4.0–10.5)
nRBC: 0 % (ref 0.0–0.2)

## 2020-03-06 LAB — CMP (CANCER CENTER ONLY)
ALT: 12 U/L (ref 0–44)
AST: 21 U/L (ref 15–41)
Albumin: 3.3 g/dL — ABNORMAL LOW (ref 3.5–5.0)
Alkaline Phosphatase: 100 U/L (ref 38–126)
Anion gap: 5 (ref 5–15)
BUN: 19 mg/dL (ref 8–23)
CO2: 30 mmol/L (ref 22–32)
Calcium: 10 mg/dL (ref 8.9–10.3)
Chloride: 106 mmol/L (ref 98–111)
Creatinine: 1.3 mg/dL — ABNORMAL HIGH (ref 0.44–1.00)
GFR, Est AFR Am: 41 mL/min — ABNORMAL LOW (ref >60)
GFR, Estimated: 36 mL/min — ABNORMAL LOW (ref >60)
Glucose, Bld: 116 mg/dL — ABNORMAL HIGH (ref 70–99)
Potassium: 4.2 mmol/L (ref 3.5–5.1)
Sodium: 141 mmol/L (ref 135–145)
Total Bilirubin: 1.5 mg/dL — ABNORMAL HIGH (ref 0.3–1.2)
Total Protein: 7 g/dL (ref 6.5–8.1)

## 2020-03-06 MED ORDER — FULVESTRANT 250 MG/5ML IM SOLN
500.0000 mg | Freq: Once | INTRAMUSCULAR | Status: AC
  Administered 2020-03-06: 500 mg via INTRAMUSCULAR

## 2020-03-06 NOTE — Progress Notes (Signed)
Hematology and Oncology Follow Up Visit  Amy White [id number] 05/19/1926 86 y.o. 03/06/2020   Principle Diagnosis:  Metastatic breast cancer - bone only metastasis  Past Therapy: Aromasin 25 mg p.o. Daily -- d/c on 06/15/2018 Ribociclib 200 mg po q day (21/14) -- changed on 03/27/2018 -- patient stopped on her own in 04/2018 Ibrance 75 mg q day (14on/14off)-- d/c Xgeva 120 mg sq q 3 months - on hold permanently due to bad teeth.   Current Therapy: Faslodex 500 mg IM q month - re-start on 09/29/2019    Interim History:  Amy White is here today for follow-up and Faslodex.  She seems to be doing pretty well.  She is really bothered by her arthritis.  She has terrible osteoarthritis.  She also has cardiac issues.  She has atrial fibrillation.  She is on blood thinner.  She has had some unsteadiness.  Hopefully, she has not had any falls.  Her last CA 27.29 was 60.  This is holding pretty steady.  She has had some slight discomfort over on the right side in the right mid lateral rib area.  She has had no problems with bowels or bladder.  She has had no nausea or vomiting.  Her appetite is okay.  She has had no fever.  She has had no bleeding from the blood thinner.  Overall, her performance status is probably ECOG 2.   Medications:  Allergies as of 03/06/2020      Reactions   Morphine And Related Rash   Sulfa Antibiotics Other (See Comments)   Mouth breaks out in sores   Ciprofloxacin Rash, Other (See Comments)   Rash in the mouth   Crestor [rosuvastatin] Other (See Comments)   Unknown reaction   Penicillins Hives, Rash, Other (See Comments)   Has patient had a PCN reaction causing immediate rash, facial/tongue/throat swelling, SOB or lightheadedness with hypotension: yes Has patient had a PCN reaction causing severe rash involving mucus membranes or skin necrosis: no Has patient had a PCN reaction that required hospitalization: no Has patient had a PCN  reaction occurring within the last 10 years: yes If all of the above answers are "NO", then may proceed with Cephalosporin use.   Vancomycin Rash, Other (See Comments)   "Red-man"      Medication List       Accurate as of March 06, 2020  1:49 PM. If you have any questions, ask your nurse or doctor.        STOP taking these medications   cromolyn 4 % ophthalmic solution Commonly known as: OPTICROM Stopped by: Volanda Napoleon, MD   HYDROcodone-acetaminophen 5-325 MG tablet Commonly known as: NORCO/VICODIN Stopped by: Volanda Napoleon, MD   oxyCODONE-acetaminophen 5-325 MG tablet Commonly known as: PERCOCET/ROXICET Stopped by: Volanda Napoleon, MD   tetrahydrozoline-zinc 0.05-0.25 % ophthalmic solution Commonly known as: VISINE-AC Stopped by: Volanda Napoleon, MD     TAKE these medications   acetaminophen 500 MG tablet Commonly known as: TYLENOL Take 500 mg by mouth every 6 (six) hours as needed for fever.   albuterol 108 (90 Base) MCG/ACT inhaler Commonly known as: VENTOLIN HFA Inhale 2 puffs into the lungs every 6 (six) hours as needed for wheezing or shortness of breath.   ALPRAZolam 0.5 MG tablet Commonly known as: XANAX Take 0.25 mg by mouth 2 (two) times daily as needed for anxiety.   bisoprolol 5 MG tablet Commonly known as: ZEBETA Take 2 tablets (10 mg total) by  mouth daily.   diphenoxylate-atropine 2.5-0.025 MG tablet Commonly known as: LOMOTIL TAKE 1 TABLET BY MOUTH AFTER EACH LOOSE STOOL, MAXIMUM DAILY DOSE IS 8 TABLETS   feeding supplement (ENSURE ENLIVE) Liqd Take 237 mLs by mouth 2 (two) times daily between meals.   furosemide 40 MG tablet Commonly known as: LASIX Take 20 mg by mouth daily as needed.   levothyroxine 88 MCG tablet Commonly known as: SYNTHROID Take 88 mcg by mouth daily.   palbociclib 75 MG capsule Commonly known as: Ibrance Take 1 capsule (75 mg total) by mouth daily with breakfast. Take whole with food. Take for 14 days on,  14 days off, repeat every 28 days.   polyethylene glycol 17 g packet Commonly known as: MIRALAX / GLYCOLAX Take 17 g by mouth daily as needed for mild constipation.   potassium chloride 10 MEQ tablet Commonly known as: KLOR-CON Take 2 tablets (20 mEq total) by mouth daily.   UNABLE TO FIND Hair prosthesis   Vitamin D (Ergocalciferol) 1.25 MG (50000 UNIT) Caps capsule Commonly known as: DRISDOL TAKE 1 CAPSULE BY MOUTH 1 TIME A WEEK   warfarin 5 MG tablet Commonly known as: COUMADIN Take as directed by the anticoagulation clinic. If you are unsure how to take this medication, talk to your nurse or doctor. Original instructions: TAKE ONE-HALF TO 1 TABLET BY MOUTH DAILY AS DIRECTED BY COUMADIN CLINIC       Allergies:  Allergies  Allergen Reactions  . Morphine And Related Rash  . Sulfa Antibiotics Other (See Comments)    Mouth breaks out in sores  . Ciprofloxacin Rash and Other (See Comments)    Rash in the mouth  . Crestor [Rosuvastatin] Other (See Comments)    Unknown reaction  . Penicillins Hives, Rash and Other (See Comments)    Has patient had a PCN reaction causing immediate rash, facial/tongue/throat swelling, SOB or lightheadedness with hypotension: yes Has patient had a PCN reaction causing severe rash involving mucus membranes or skin necrosis: no Has patient had a PCN reaction that required hospitalization: no Has patient had a PCN reaction occurring within the last 10 years: yes If all of the above answers are "NO", then may proceed with Cephalosporin use.   . Vancomycin Rash and Other (See Comments)    "Red-man"    Past Medical History, Surgical history, Social history, and Family History were reviewed and updated.  Review of Systems: Review of Systems  Constitutional: Positive for malaise/fatigue.  HENT: Negative.   Eyes: Negative.   Respiratory: Negative.   Cardiovascular: Positive for chest pain.  Gastrointestinal: Negative.   Genitourinary:  Negative.   Musculoskeletal: Positive for joint pain.  Skin: Negative.   Neurological: Negative.   Endo/Heme/Allergies: Negative.   Psychiatric/Behavioral: Negative.      Physical Exam:  height is 5\' 5"  (1.651 m) and weight is 146 lb 12.8 oz (66.6 kg). Her oral temperature is 97.9 F (36.6 C). Her blood pressure is 121/58 (abnormal) and her pulse is 69. Her respiration is 18 and oxygen saturation is 98%.   Wt Readings from Last 3 Encounters:  03/06/20 146 lb 12.8 oz (66.6 kg)  12/28/19 146 lb 0.6 oz (66.2 kg)  11/23/19 148 lb (67.1 kg)    Physical Exam Vitals reviewed.  HENT:     Head: Normocephalic and atraumatic.  Eyes:     Pupils: Pupils are equal, round, and reactive to light.  Cardiovascular:     Rate and Rhythm: Normal rate and regular rhythm.  Heart sounds: Normal heart sounds.     Comments: Cardiac exam shows irregular rate and irregular rhythm consistent with atrial fibrillation.  The rate is well controlled.  She has no murmurs. Pulmonary:     Effort: Pulmonary effort is normal.     Breath sounds: Normal breath sounds.  Abdominal:     General: Bowel sounds are normal.     Palpations: Abdomen is soft.  Musculoskeletal:        General: No tenderness or deformity. Normal range of motion.     Cervical back: Normal range of motion.     Comments: Extremities shows arthritic changes.  She has bad arthritic changes in her hands.  She has joint deformities on both hands.  Lymphadenopathy:     Cervical: No cervical adenopathy.  Skin:    General: Skin is warm and dry.     Findings: No erythema or rash.  Neurological:     Mental Status: She is alert and oriented to person, place, and time.  Psychiatric:        Behavior: Behavior normal.        Thought Content: Thought content normal.        Judgment: Judgment normal.       Lab Results  Component Value Date   WBC 4.3 03/06/2020   HGB 9.7 (L) 03/06/2020   HCT 30.9 (L) 03/06/2020   MCV 95.4 03/06/2020   PLT  138 (L) 03/06/2020   Lab Results  Component Value Date   FERRITIN 52 07/25/2018   IRON 78 07/25/2018   TIBC 359 07/25/2018   UIBC 280 07/25/2018   IRONPCTSAT 22 07/25/2018   Lab Results  Component Value Date   RBC 3.24 (L) 03/06/2020   No results found for: KPAFRELGTCHN, LAMBDASER, KAPLAMBRATIO No results found for: IGGSERUM, IGA, IGMSERUM No results found for: Odetta Pink, SPEI   Chemistry      Component Value Date/Time   NA 141 03/06/2020 1258   NA 142 03/22/2018 1638   NA 146 (H) 01/25/2017 1448   NA 142 07/31/2015 1203   K 4.2 03/06/2020 1258   K 4.0 01/25/2017 1448   K 5.1 07/31/2015 1203   CL 106 03/06/2020 1258   CL 108 01/25/2017 1448   CO2 30 03/06/2020 1258   CO2 29 01/25/2017 1448   CO2 23 07/31/2015 1203   BUN 19 03/06/2020 1258   BUN 13 03/22/2018 1638   BUN 18 01/25/2017 1448   BUN 23.3 07/31/2015 1203   CREATININE 1.30 (H) 03/06/2020 1258   CREATININE 1.2 01/25/2017 1448   CREATININE 1.1 07/31/2015 1203      Component Value Date/Time   CALCIUM 10.0 03/06/2020 1258   CALCIUM 9.3 01/25/2017 1448   CALCIUM 9.3 07/31/2015 1203   ALKPHOS 100 03/06/2020 1258   ALKPHOS 64 01/25/2017 1448   ALKPHOS 57 07/31/2015 1203   AST 21 03/06/2020 1258   AST 18 07/31/2015 1203   ALT 12 03/06/2020 1258   ALT 20 01/25/2017 1448   ALT 15 07/31/2015 1203   BILITOT 1.5 (H) 03/06/2020 1258   BILITOT 0.96 07/31/2015 1203       Impression and Plan: Amy White is a very pleasant 86 yo caucasian female with metastatic breast cancer still confined to the bones.   We will have to see what the next bone scan shows.  We will see about setting this up in October.  Again, we are just concerned about quality of life for  Amy White.  My goal is that she not die from the breast cancer but that cardiac issues will ultimately be the cause of her passing on to [facility].  I will see her back in 4 weeks.   Volanda Napoleon,  MD 9/29/20211:49 PM

## 2020-03-06 NOTE — Patient Instructions (Signed)
Fulvestrant injection What is this medicine? FULVESTRANT (ful VES trant) blocks the effects of estrogen. It is used to treat breast cancer. This medicine may be used for other purposes; ask your health care provider or pharmacist if you have questions. COMMON BRAND NAME(S): FASLODEX What should I tell my health care provider before I take this medicine? They need to know if you have any of these conditions:  bleeding disorders  liver disease  low blood counts, like low white cell, platelet, or red cell counts  an unusual or allergic reaction to fulvestrant, other medicines, foods, dyes, or preservatives  pregnant or trying to get pregnant  breast-feeding How should I use this medicine? This medicine is for injection into a muscle. It is usually given by a health care professional in a hospital or clinic setting. Talk to your pediatrician regarding the use of this medicine in children. Special care may be needed. Overdosage: If you think you have taken too much of this medicine contact a poison control center or emergency room at once. NOTE: This medicine is only for you. Do not share this medicine with others. What if I miss a dose? It is important not to miss your dose. Call your doctor or health care professional if you are unable to keep an appointment. What may interact with this medicine?  medicines that treat or prevent blood clots like warfarin, enoxaparin, dalteparin, apixaban, dabigatran, and rivaroxaban This list may not describe all possible interactions. Give your health care provider a list of all the medicines, herbs, non-prescription drugs, or dietary supplements you use. Also tell them if you smoke, drink alcohol, or use illegal drugs. Some items may interact with your medicine. What should I watch for while using this medicine? Your condition will be monitored carefully while you are receiving this medicine. You will need important blood work done while you are taking  this medicine. Do not become pregnant while taking this medicine or for at least 1 year after stopping it. Women of child-bearing potential will need to have a negative pregnancy test before starting this medicine. Women should inform their doctor if they wish to become pregnant or think they might be pregnant. There is a potential for serious side effects to an unborn child. Men should inform their doctors if they wish to father a child. This medicine may lower sperm counts. Talk to your health care professional or pharmacist for more information. Do not breast-feed an infant while taking this medicine or for 1 year after the last dose. What side effects may I notice from receiving this medicine? Side effects that you should report to your doctor or health care professional as soon as possible:  allergic reactions like skin rash, itching or hives, swelling of the face, lips, or tongue  feeling faint or lightheaded, falls  pain, tingling, numbness, or weakness in the legs  signs and symptoms of infection like fever or chills; cough; flu-like symptoms; sore throat  vaginal bleeding Side effects that usually do not require medical attention (report to your doctor or health care professional if they continue or are bothersome):  aches, pains  constipation  diarrhea  headache  hot flashes  nausea, vomiting  pain at site where injected  stomach pain This list may not describe all possible side effects. Call your doctor for medical advice about side effects. You may report side effects to FDA at 1-800-FDA-1088. Where should I keep my medicine? This drug is given in a hospital or clinic and will   not be stored at home. NOTE: This sheet is a summary. It may not cover all possible information. If you have questions about this medicine, talk to your doctor, pharmacist, or health care provider.  2020 Elsevier/Gold Standard (2017-09-02 11:34:41)  

## 2020-03-06 NOTE — Telephone Encounter (Signed)
Appointments scheduled calendar printed per 9/29 los 

## 2020-03-07 LAB — CANCER ANTIGEN 27.29: CA 27.29: 53.1 U/mL — ABNORMAL HIGH (ref 0.0–38.6)

## 2020-03-07 LAB — IRON AND TIBC
Iron: 54 ug/dL (ref 41–142)
Saturation Ratios: 21 % (ref 21–57)
TIBC: 263 ug/dL (ref 236–444)
UIBC: 208 ug/dL (ref 120–384)

## 2020-03-07 LAB — FERRITIN: Ferritin: 108 ng/mL (ref 11–307)

## 2020-03-08 ENCOUNTER — Ambulatory Visit (INDEPENDENT_AMBULATORY_CARE_PROVIDER_SITE_OTHER): Payer: Medicare Other

## 2020-03-08 ENCOUNTER — Telehealth: Payer: Self-pay | Admitting: *Deleted

## 2020-03-08 ENCOUNTER — Other Ambulatory Visit: Payer: Self-pay

## 2020-03-08 DIAGNOSIS — I4891 Unspecified atrial fibrillation: Secondary | ICD-10-CM | POA: Diagnosis not present

## 2020-03-08 DIAGNOSIS — Z7901 Long term (current) use of anticoagulants: Secondary | ICD-10-CM

## 2020-03-08 LAB — POCT INR: INR: 2.5 (ref 2.0–3.0)

## 2020-03-08 NOTE — Patient Instructions (Signed)
continue with 1 tablet each Monday and Friday, 1/2 tablet all other days. Repeat INR in 6 weeks.

## 2020-03-08 NOTE — Telephone Encounter (Signed)
-----   Message from Volanda Napoleon, MD sent at 03/08/2020 11:00 AM EDT ----- Call - the tumor level is down to 50!!  Laurey Arrow

## 2020-03-08 NOTE — Telephone Encounter (Signed)
As noted below by Dr. Marin Olp, the tumor level is down to 50. The grand daughters cell phone was full and unable to leave a message. Will continue to call.

## 2020-03-20 ENCOUNTER — Other Ambulatory Visit: Payer: Self-pay | Admitting: Hematology & Oncology

## 2020-03-20 DIAGNOSIS — E559 Vitamin D deficiency, unspecified: Secondary | ICD-10-CM

## 2020-03-22 ENCOUNTER — Encounter (HOSPITAL_COMMUNITY)
Admission: RE | Admit: 2020-03-22 | Discharge: 2020-03-22 | Disposition: A | Discharge status: Home / Self Care | Payer: Medicare Other | Source: Ambulatory Visit | Attending: Hematology & Oncology | Admitting: Hematology & Oncology

## 2020-03-22 ENCOUNTER — Other Ambulatory Visit: Payer: Self-pay

## 2020-03-22 DIAGNOSIS — C50911 Malignant neoplasm of unspecified site of right female breast: Secondary | ICD-10-CM | POA: Diagnosis not present

## 2020-03-22 DIAGNOSIS — C7951 Secondary malignant neoplasm of bone: Secondary | ICD-10-CM | POA: Diagnosis not present

## 2020-03-22 DIAGNOSIS — M19032 Primary osteoarthritis, left wrist: Secondary | ICD-10-CM | POA: Diagnosis not present

## 2020-03-22 DIAGNOSIS — C50919 Malignant neoplasm of unspecified site of unspecified female breast: Secondary | ICD-10-CM | POA: Diagnosis not present

## 2020-03-22 DIAGNOSIS — M19071 Primary osteoarthritis, right ankle and foot: Secondary | ICD-10-CM | POA: Diagnosis not present

## 2020-03-22 DIAGNOSIS — M19031 Primary osteoarthritis, right wrist: Secondary | ICD-10-CM | POA: Diagnosis not present

## 2020-03-22 MED ORDER — TECHNETIUM TC 99M MEDRONATE IV KIT
18.9000 | PACK | Freq: Once | INTRAVENOUS | Status: AC
  Administered 2020-03-22: 18.9 via INTRAVENOUS

## 2020-03-25 ENCOUNTER — Telehealth: Payer: Self-pay | Admitting: *Deleted

## 2020-03-25 NOTE — Telephone Encounter (Signed)
-----   Message from Volanda Napoleon, MD sent at 03/25/2020  7:14 AM EDT ----- Call - the bone scan is stable!!!  Nothing is new and nothing is growing!!!  Laurey Arrow

## 2020-03-25 NOTE — Telephone Encounter (Signed)
As noted below by Dr. Marin Olp, I informed the granddaughter that the bone scan is stable, nothing is new and nothing is growing. She verbalized understanding.

## 2020-04-04 ENCOUNTER — Other Ambulatory Visit: Payer: Self-pay

## 2020-04-04 ENCOUNTER — Inpatient Hospital Stay: Payer: Medicare Other

## 2020-04-04 ENCOUNTER — Inpatient Hospital Stay (HOSPITAL_BASED_OUTPATIENT_CLINIC_OR_DEPARTMENT_OTHER): Payer: Medicare Other | Admitting: Hematology & Oncology

## 2020-04-04 ENCOUNTER — Encounter: Payer: Self-pay | Admitting: Family

## 2020-04-04 ENCOUNTER — Inpatient Hospital Stay: Payer: Medicare Other | Attending: Hematology

## 2020-04-04 VITALS — BP 132/63 | HR 82 | Temp 98.5°F | Resp 19 | Wt 145.0 lb

## 2020-04-04 DIAGNOSIS — M1990 Unspecified osteoarthritis, unspecified site: Secondary | ICD-10-CM | POA: Diagnosis not present

## 2020-04-04 DIAGNOSIS — Z8679 Personal history of other diseases of the circulatory system: Secondary | ICD-10-CM | POA: Diagnosis not present

## 2020-04-04 DIAGNOSIS — Z880 Allergy status to penicillin: Secondary | ICD-10-CM | POA: Insufficient documentation

## 2020-04-04 DIAGNOSIS — Z882 Allergy status to sulfonamides status: Secondary | ICD-10-CM | POA: Diagnosis not present

## 2020-04-04 DIAGNOSIS — Z79811 Long term (current) use of aromatase inhibitors: Secondary | ICD-10-CM | POA: Insufficient documentation

## 2020-04-04 DIAGNOSIS — D649 Anemia, unspecified: Secondary | ICD-10-CM | POA: Insufficient documentation

## 2020-04-04 DIAGNOSIS — Z86711 Personal history of pulmonary embolism: Secondary | ICD-10-CM | POA: Insufficient documentation

## 2020-04-04 DIAGNOSIS — Z881 Allergy status to other antibiotic agents status: Secondary | ICD-10-CM | POA: Diagnosis not present

## 2020-04-04 DIAGNOSIS — C7951 Secondary malignant neoplasm of bone: Secondary | ICD-10-CM

## 2020-04-04 DIAGNOSIS — R079 Chest pain, unspecified: Secondary | ICD-10-CM | POA: Diagnosis not present

## 2020-04-04 DIAGNOSIS — Z5111 Encounter for antineoplastic chemotherapy: Secondary | ICD-10-CM | POA: Insufficient documentation

## 2020-04-04 DIAGNOSIS — Z79899 Other long term (current) drug therapy: Secondary | ICD-10-CM | POA: Diagnosis not present

## 2020-04-04 DIAGNOSIS — Z885 Allergy status to narcotic agent status: Secondary | ICD-10-CM | POA: Diagnosis not present

## 2020-04-04 DIAGNOSIS — M2550 Pain in unspecified joint: Secondary | ICD-10-CM | POA: Insufficient documentation

## 2020-04-04 DIAGNOSIS — C50911 Malignant neoplasm of unspecified site of right female breast: Secondary | ICD-10-CM

## 2020-04-04 DIAGNOSIS — C50919 Malignant neoplasm of unspecified site of unspecified female breast: Secondary | ICD-10-CM

## 2020-04-04 DIAGNOSIS — R5383 Other fatigue: Secondary | ICD-10-CM | POA: Diagnosis not present

## 2020-04-04 LAB — CMP (CANCER CENTER ONLY)
ALT: 8 U/L (ref 0–44)
AST: 21 U/L (ref 15–41)
Albumin: 3.5 g/dL (ref 3.5–5.0)
Alkaline Phosphatase: 98 U/L (ref 38–126)
Anion gap: 6 (ref 5–15)
BUN: 18 mg/dL (ref 8–23)
CO2: 30 mmol/L (ref 22–32)
Calcium: 10.4 mg/dL — ABNORMAL HIGH (ref 8.9–10.3)
Chloride: 103 mmol/L (ref 98–111)
Creatinine: 1.57 mg/dL — ABNORMAL HIGH (ref 0.44–1.00)
GFR, Estimated: 31 mL/min — ABNORMAL LOW (ref >60)
Glucose, Bld: 113 mg/dL — ABNORMAL HIGH (ref 70–99)
Potassium: 4.3 mmol/L (ref 3.5–5.1)
Sodium: 139 mmol/L (ref 135–145)
Total Bilirubin: 2.2 mg/dL — ABNORMAL HIGH (ref 0.3–1.2)
Total Protein: 7.3 g/dL (ref 6.5–8.1)

## 2020-04-04 LAB — CBC WITH DIFFERENTIAL (CANCER CENTER ONLY)
Abs Immature Granulocytes: 0.02 10*3/uL (ref 0.00–0.07)
Basophils Absolute: 0.1 10*3/uL (ref 0.0–0.1)
Basophils Relative: 1 %
Eosinophils Absolute: 0.2 10*3/uL (ref 0.0–0.5)
Eosinophils Relative: 6 %
HCT: 29.5 % — ABNORMAL LOW (ref 36.0–46.0)
Hemoglobin: 9.5 g/dL — ABNORMAL LOW (ref 12.0–15.0)
Immature Granulocytes: 1 %
Lymphocytes Relative: 19 %
Lymphs Abs: 0.8 10*3/uL (ref 0.7–4.0)
MCH: 30.2 pg (ref 26.0–34.0)
MCHC: 32.2 g/dL (ref 30.0–36.0)
MCV: 93.7 fL (ref 80.0–100.0)
Monocytes Absolute: 0.5 10*3/uL (ref 0.1–1.0)
Monocytes Relative: 12 %
Neutro Abs: 2.6 10*3/uL (ref 1.7–7.7)
Neutrophils Relative %: 61 %
Platelet Count: 140 10*3/uL — ABNORMAL LOW (ref 150–400)
RBC: 3.15 MIL/uL — ABNORMAL LOW (ref 3.87–5.11)
RDW: 15.5 % (ref 11.5–15.5)
WBC Count: 4.3 10*3/uL (ref 4.0–10.5)
nRBC: 0 % (ref 0.0–0.2)

## 2020-04-04 MED ORDER — FULVESTRANT 250 MG/5ML IM SOLN
500.0000 mg | Freq: Once | INTRAMUSCULAR | Status: AC
  Administered 2020-04-04: 500 mg via INTRAMUSCULAR
  Filled 2020-04-04: qty 10

## 2020-04-04 NOTE — Patient Instructions (Signed)
Fulvestrant injection What is this medicine? FULVESTRANT (ful VES trant) blocks the effects of estrogen. It is used to treat breast cancer. This medicine may be used for other purposes; ask your health care provider or pharmacist if you have questions. COMMON BRAND NAME(S): FASLODEX What should I tell my health care provider before I take this medicine? They need to know if you have any of these conditions:  bleeding disorders  liver disease  low blood counts, like low white cell, platelet, or red cell counts  an unusual or allergic reaction to fulvestrant, other medicines, foods, dyes, or preservatives  pregnant or trying to get pregnant  breast-feeding How should I use this medicine? This medicine is for injection into a muscle. It is usually given by a health care professional in a hospital or clinic setting. Talk to your pediatrician regarding the use of this medicine in children. Special care may be needed. Overdosage: If you think you have taken too much of this medicine contact a poison control center or emergency room at once. NOTE: This medicine is only for you. Do not share this medicine with others. What if I miss a dose? It is important not to miss your dose. Call your doctor or health care professional if you are unable to keep an appointment. What may interact with this medicine?  medicines that treat or prevent blood clots like warfarin, enoxaparin, dalteparin, apixaban, dabigatran, and rivaroxaban This list may not describe all possible interactions. Give your health care provider a list of all the medicines, herbs, non-prescription drugs, or dietary supplements you use. Also tell them if you smoke, drink alcohol, or use illegal drugs. Some items may interact with your medicine. What should I watch for while using this medicine? Your condition will be monitored carefully while you are receiving this medicine. You will need important blood work done while you are taking  this medicine. Do not become pregnant while taking this medicine or for at least 1 year after stopping it. Women of child-bearing potential will need to have a negative pregnancy test before starting this medicine. Women should inform their doctor if they wish to become pregnant or think they might be pregnant. There is a potential for serious side effects to an unborn child. Men should inform their doctors if they wish to father a child. This medicine may lower sperm counts. Talk to your health care professional or pharmacist for more information. Do not breast-feed an infant while taking this medicine or for 1 year after the last dose. What side effects may I notice from receiving this medicine? Side effects that you should report to your doctor or health care professional as soon as possible:  allergic reactions like skin rash, itching or hives, swelling of the face, lips, or tongue  feeling faint or lightheaded, falls  pain, tingling, numbness, or weakness in the legs  signs and symptoms of infection like fever or chills; cough; flu-like symptoms; sore throat  vaginal bleeding Side effects that usually do not require medical attention (report to your doctor or health care professional if they continue or are bothersome):  aches, pains  constipation  diarrhea  headache  hot flashes  nausea, vomiting  pain at site where injected  stomach pain This list may not describe all possible side effects. Call your doctor for medical advice about side effects. You may report side effects to FDA at 1-800-FDA-1088. Where should I keep my medicine? This drug is given in a hospital or clinic and will   not be stored at home. NOTE: This sheet is a summary. It may not cover all possible information. If you have questions about this medicine, talk to your doctor, pharmacist, or health care provider.  2020 Elsevier/Gold Standard (2017-09-02 11:34:41)  

## 2020-04-04 NOTE — Progress Notes (Signed)
Hematology and Oncology Follow Up Visit  Amy White [id number] [birth year] 86 y.o. 04/04/2020   Principle Diagnosis:  Metastatic breast cancer - bone only metastasis  Past Therapy: Aromasin 25 mg p.o. Daily -- d/c on 06/15/2018 Ribociclib 200 mg po q day (21/14) -- changed on 03/27/2018 -- patient stopped on her own in 04/2018 Ibrance 75 mg q day (14on/14off)-- d/c Xgeva 120 mg sq q 3 months - on hold permanently due to bad teeth.   Current Therapy: Faslodex 500 mg IM q month - re-start on 09/29/2019    Interim History:  Amy White is here today for follow-up and Faslodex. Overall, she is doing okay. She really is about the same. She has had no flareups of her congestive heart failure or atrial fibrillation.  She does have some fatigue. There is no increased shortness of breath. She has had problems with pleural effusions in the past. The effusions have not been malignant.  She is anemic. She is chronically anemic. I suspect that her erythropoietin level is going to be on the low side. I also suspect her iron might be on the low side. We will have to work this up when we get her back.  We did do a bone scan on her. This was done in early October. Thankfully the bone scan showed no progression of her disease. She has stability of her disease which I think is fantastic given she is only on Faslodex.  Her last CA 27.29 was stable at 53.   She is eating okay. She hopefully will have a nice Thanksgiving in a month.  She has had the osteoarthritis. She has horrible osteoarthritis.  She has had no problems with bowels or bladder.  Overall, her performance status is ECOG 2-3.   Medications:  Allergies as of 04/04/2020      Reactions   Morphine And Related Rash   Sulfa Antibiotics Other (See Comments)   Mouth breaks out in sores   Ciprofloxacin Rash, Other (See Comments)   Rash in the mouth   Crestor [rosuvastatin] Other (See Comments)   Unknown reaction    Penicillins Hives, Rash, Other (See Comments)   Has patient had a PCN reaction causing immediate rash, facial/tongue/throat swelling, SOB or lightheadedness with hypotension: yes Has patient had a PCN reaction causing severe rash involving mucus membranes or skin necrosis: no Has patient had a PCN reaction that required hospitalization: no Has patient had a PCN reaction occurring within the last 10 years: yes If all of the above answers are "NO", then may proceed with Cephalosporin use.   Vancomycin Rash, Other (See Comments)   "Red-man"      Medication List       Accurate as of April 04, 2020  3:45 PM. If you have any questions, ask your nurse or doctor.        acetaminophen 500 MG tablet Commonly known as: TYLENOL Take 500 mg by mouth every 6 (six) hours as needed for fever.   albuterol 108 (90 Base) MCG/ACT inhaler Commonly known as: VENTOLIN HFA Inhale 2 puffs into the lungs every 6 (six) hours as needed for wheezing or shortness of breath.   ALPRAZolam 0.5 MG tablet Commonly known as: XANAX Take 0.25 mg by mouth 2 (two) times daily as needed for anxiety.   bisoprolol 5 MG tablet Commonly known as: ZEBETA Take 2 tablets (10 mg total) by mouth daily.   diphenoxylate-atropine 2.5-0.025 MG tablet Commonly known as: LOMOTIL TAKE 1 TABLET BY MOUTH AFTER  EACH LOOSE STOOL, MAXIMUM DAILY DOSE IS 8 TABLETS   feeding supplement Liqd Take 237 mLs by mouth 2 (two) times daily between meals.   furosemide 40 MG tablet Commonly known as: LASIX Take 20 mg by mouth daily as needed.   levothyroxine 88 MCG tablet Commonly known as: SYNTHROID Take 88 mcg by mouth daily.   palbociclib 75 MG capsule Commonly known as: Ibrance Take 1 capsule (75 mg total) by mouth daily with breakfast. Take whole with food. Take for 14 days on, 14 days off, repeat every 28 days.   polyethylene glycol 17 g packet Commonly known as: MIRALAX / GLYCOLAX Take 17 g by mouth daily as needed for mild  constipation.   potassium chloride 10 MEQ tablet Commonly known as: KLOR-CON Take 2 tablets (20 mEq total) by mouth daily.   UNABLE TO FIND Hair prosthesis   Vitamin D (Ergocalciferol) 1.25 MG (50000 UNIT) Caps capsule Commonly known as: DRISDOL TAKE 1 CAPSULE BY MOUTH 1 TIME A WEEK   warfarin 5 MG tablet Commonly known as: COUMADIN Take as directed by the anticoagulation clinic. If you are unsure how to take this medication, talk to your nurse or doctor. Original instructions: TAKE ONE-HALF TO 1 TABLET BY MOUTH DAILY AS DIRECTED BY COUMADIN CLINIC       Allergies:  Allergies  Allergen Reactions  . Morphine And Related Rash  . Sulfa Antibiotics Other (See Comments)    Mouth breaks out in sores  . Ciprofloxacin Rash and Other (See Comments)    Rash in the mouth  . Crestor [Rosuvastatin] Other (See Comments)    Unknown reaction  . Penicillins Hives, Rash and Other (See Comments)    Has patient had a PCN reaction causing immediate rash, facial/tongue/throat swelling, SOB or lightheadedness with hypotension: yes Has patient had a PCN reaction causing severe rash involving mucus membranes or skin necrosis: no Has patient had a PCN reaction that required hospitalization: no Has patient had a PCN reaction occurring within the last 10 years: yes If all of the above answers are "NO", then may proceed with Cephalosporin use.   . Vancomycin Rash and Other (See Comments)    "Red-man"    Past Medical History, Surgical history, Social history, and Family History were reviewed and updated.  Review of Systems: Review of Systems  Constitutional: Positive for malaise/fatigue.  HENT: Negative.   Eyes: Negative.   Respiratory: Negative.   Cardiovascular: Positive for chest pain.  Gastrointestinal: Negative.   Genitourinary: Negative.   Musculoskeletal: Positive for joint pain.  Skin: Negative.   Neurological: Negative.   Endo/Heme/Allergies: Negative.   Psychiatric/Behavioral:  Negative.    Physical Exam:  weight is 145 lb (65.8 kg). Her oral temperature is 98.5 F (36.9 C). Her blood pressure is 132/63 and her pulse is 82. Her respiration is 19 and oxygen saturation is 95%.   Wt Readings from Last 3 Encounters:  04/04/20 145 lb (65.8 kg)  03/06/20 146 lb 12.8 oz (66.6 kg)  12/28/19 146 lb 0.6 oz (66.2 kg)    Physical Exam Vitals reviewed.  HENT:     Head: Normocephalic and atraumatic.  Eyes:     Pupils: Pupils are equal, round, and reactive to light.  Cardiovascular:     Rate and Rhythm: Normal rate and regular rhythm.     Heart sounds: Normal heart sounds.     Comments: Cardiac exam shows irregular rate and irregular rhythm consistent with atrial fibrillation.  The rate is well controlled.  She  has no murmurs. Pulmonary:     Effort: Pulmonary effort is normal.     Breath sounds: Normal breath sounds.  Abdominal:     General: Bowel sounds are normal.     Palpations: Abdomen is soft.  Musculoskeletal:        General: No tenderness or deformity. Normal range of motion.     Cervical back: Normal range of motion.     Comments: Extremities shows arthritic changes.  She has bad arthritic changes in her hands.  She has joint deformities on both hands.  Lymphadenopathy:     Cervical: No cervical adenopathy.  Skin:    General: Skin is warm and dry.     Findings: No erythema or rash.  Neurological:     Mental Status: She is alert and oriented to person, place, and time.  Psychiatric:        Behavior: Behavior normal.        Thought Content: Thought content normal.        Judgment: Judgment normal.       Lab Results  Component Value Date   WBC 4.3 04/04/2020   HGB 9.5 (L) 04/04/2020   HCT 29.5 (L) 04/04/2020   MCV 93.7 04/04/2020   PLT 140 (L) 04/04/2020   Lab Results  Component Value Date   FERRITIN 108 03/06/2020   IRON 54 03/06/2020   TIBC 263 03/06/2020   UIBC 208 03/06/2020   IRONPCTSAT 21 03/06/2020   Lab Results  Component Value  Date   RBC 3.15 (L) 04/04/2020   No results found for: KPAFRELGTCHN, LAMBDASER, KAPLAMBRATIO No results found for: Kandis Cocking, IGMSERUM No results found for: Odetta Pink, SPEI   Chemistry      Component Value Date/Time   NA 139 04/04/2020 1344   NA 142 03/22/2018 1638   NA 146 (H) 01/25/2017 1448   NA 142 07/31/2015 1203   K 4.3 04/04/2020 1344   K 4.0 01/25/2017 1448   K 5.1 07/31/2015 1203   CL 103 04/04/2020 1344   CL 108 01/25/2017 1448   CO2 30 04/04/2020 1344   CO2 29 01/25/2017 1448   CO2 23 07/31/2015 1203   BUN 18 04/04/2020 1344   BUN 13 03/22/2018 1638   BUN 18 01/25/2017 1448   BUN 23.3 07/31/2015 1203   CREATININE 1.57 (H) 04/04/2020 1344   CREATININE 1.2 01/25/2017 1448   CREATININE 1.1 07/31/2015 1203      Component Value Date/Time   CALCIUM 10.4 (H) 04/04/2020 1344   CALCIUM 9.3 01/25/2017 1448   CALCIUM 9.3 07/31/2015 1203   ALKPHOS 98 04/04/2020 1344   ALKPHOS 64 01/25/2017 1448   ALKPHOS 57 07/31/2015 1203   AST 21 04/04/2020 1344   AST 18 07/31/2015 1203   ALT 8 04/04/2020 1344   ALT 20 01/25/2017 1448   ALT 15 07/31/2015 1203   BILITOT 2.2 (H) 04/04/2020 1344   BILITOT 0.96 07/31/2015 1203      Impression and Plan: Amy White is a very pleasant 86 yo caucasian female with metastatic breast cancer still confined to the bones.   I am is very impressed by the fact that she does not have bone disease progression. I really think that her breast cancer is not going to be the cause of her demise. I really believe that her heart is malignant to be what is going to cause issues for her.  We will go ahead and try to help with the  anemia when we see her back. I suspect she probably will need ESA at some point.  We will get a chest x-ray when we see her back also.  Again quality of life is really the primary goal for Amy White.  I would like to see her back after Thanksgiving.   Volanda Napoleon, MD 10/28/20213:45 PM

## 2020-04-04 NOTE — Progress Notes (Signed)
Pt discharged in no apparent distress. Pt left ambulatory without assistance. Pt aware of discharge instructions and verbalized understanding and had no further questions.  

## 2020-04-05 LAB — CANCER ANTIGEN 27.29: CA 27.29: 66.6 U/mL — ABNORMAL HIGH (ref 0.0–38.6)

## 2020-04-16 ENCOUNTER — Ambulatory Visit (INDEPENDENT_AMBULATORY_CARE_PROVIDER_SITE_OTHER): Payer: Medicare Other

## 2020-04-16 DIAGNOSIS — I495 Sick sinus syndrome: Secondary | ICD-10-CM

## 2020-04-16 LAB — CUP PACEART REMOTE DEVICE CHECK
Battery Remaining Longevity: 41 mo
Battery Voltage: 2.97 V
Brady Statistic RA Percent Paced: 0 %
Brady Statistic RV Percent Paced: 28.08 %
Date Time Interrogation Session: 20211109114339
Implantable Lead Implant Date: 20160223
Implantable Lead Implant Date: 20160223
Implantable Lead Location: 753859
Implantable Lead Location: 753860
Implantable Lead Model: 5076
Implantable Lead Model: 5076
Implantable Pulse Generator Implant Date: 20160223
Lead Channel Impedance Value: 304 Ohm
Lead Channel Impedance Value: 361 Ohm
Lead Channel Impedance Value: 399 Ohm
Lead Channel Impedance Value: 532 Ohm
Lead Channel Pacing Threshold Amplitude: 0.75 V
Lead Channel Pacing Threshold Amplitude: 0.875 V
Lead Channel Pacing Threshold Pulse Width: 0.4 ms
Lead Channel Pacing Threshold Pulse Width: 0.4 ms
Lead Channel Sensing Intrinsic Amplitude: 0.625 mV
Lead Channel Sensing Intrinsic Amplitude: 0.625 mV
Lead Channel Sensing Intrinsic Amplitude: 10.375 mV
Lead Channel Sensing Intrinsic Amplitude: 10.375 mV
Lead Channel Setting Pacing Amplitude: 2.25 V
Lead Channel Setting Pacing Pulse Width: 0.4 ms
Lead Channel Setting Sensing Sensitivity: 0.9 mV

## 2020-04-18 NOTE — Progress Notes (Signed)
Remote pacemaker transmission.   

## 2020-04-19 ENCOUNTER — Ambulatory Visit (INDEPENDENT_AMBULATORY_CARE_PROVIDER_SITE_OTHER): Payer: Medicare Other

## 2020-04-19 DIAGNOSIS — I4891 Unspecified atrial fibrillation: Secondary | ICD-10-CM

## 2020-04-19 DIAGNOSIS — Z7901 Long term (current) use of anticoagulants: Secondary | ICD-10-CM | POA: Diagnosis not present

## 2020-04-19 DIAGNOSIS — Z5181 Encounter for therapeutic drug level monitoring: Secondary | ICD-10-CM

## 2020-04-19 LAB — POCT INR: INR: 1.7 — AB (ref 2.0–3.0)

## 2020-04-19 NOTE — Patient Instructions (Signed)
Take 2 tablets today and then continue with 1 tablet each Monday and Friday, 1/2 tablet all other days. Repeat INR in 4 weeks.

## 2020-05-09 ENCOUNTER — Other Ambulatory Visit: Payer: Self-pay

## 2020-05-09 ENCOUNTER — Inpatient Hospital Stay: Payer: Medicare Other | Admitting: Family

## 2020-05-09 ENCOUNTER — Ambulatory Visit (INDEPENDENT_AMBULATORY_CARE_PROVIDER_SITE_OTHER): Payer: Medicare Other

## 2020-05-09 ENCOUNTER — Ambulatory Visit (HOSPITAL_COMMUNITY)
Admission: EM | Admit: 2020-05-09 | Discharge: 2020-05-09 | Disposition: A | Discharge status: Home / Self Care | Payer: Medicare Other | Attending: Family Medicine | Admitting: Family Medicine

## 2020-05-09 ENCOUNTER — Inpatient Hospital Stay: Payer: Medicare Other | Attending: Hematology

## 2020-05-09 ENCOUNTER — Inpatient Hospital Stay: Payer: Medicare Other

## 2020-05-09 ENCOUNTER — Encounter (HOSPITAL_COMMUNITY): Payer: Self-pay

## 2020-05-09 DIAGNOSIS — Z79811 Long term (current) use of aromatase inhibitors: Secondary | ICD-10-CM | POA: Insufficient documentation

## 2020-05-09 DIAGNOSIS — L03116 Cellulitis of left lower limb: Secondary | ICD-10-CM | POA: Diagnosis not present

## 2020-05-09 DIAGNOSIS — Z043 Encounter for examination and observation following other accident: Secondary | ICD-10-CM | POA: Diagnosis not present

## 2020-05-09 DIAGNOSIS — Z170 Estrogen receptor positive status [ER+]: Secondary | ICD-10-CM | POA: Insufficient documentation

## 2020-05-09 DIAGNOSIS — Z7901 Long term (current) use of anticoagulants: Secondary | ICD-10-CM | POA: Insufficient documentation

## 2020-05-09 DIAGNOSIS — C50911 Malignant neoplasm of unspecified site of right female breast: Secondary | ICD-10-CM | POA: Insufficient documentation

## 2020-05-09 DIAGNOSIS — W19XXXA Unspecified fall, initial encounter: Secondary | ICD-10-CM

## 2020-05-09 DIAGNOSIS — C7951 Secondary malignant neoplasm of bone: Secondary | ICD-10-CM | POA: Insufficient documentation

## 2020-05-09 DIAGNOSIS — S8992XA Unspecified injury of left lower leg, initial encounter: Secondary | ICD-10-CM

## 2020-05-09 DIAGNOSIS — Z79899 Other long term (current) drug therapy: Secondary | ICD-10-CM | POA: Insufficient documentation

## 2020-05-09 DIAGNOSIS — R202 Paresthesia of skin: Secondary | ICD-10-CM | POA: Insufficient documentation

## 2020-05-09 DIAGNOSIS — M25473 Effusion, unspecified ankle: Secondary | ICD-10-CM | POA: Insufficient documentation

## 2020-05-09 DIAGNOSIS — J449 Chronic obstructive pulmonary disease, unspecified: Secondary | ICD-10-CM | POA: Insufficient documentation

## 2020-05-09 DIAGNOSIS — M7989 Other specified soft tissue disorders: Secondary | ICD-10-CM | POA: Diagnosis not present

## 2020-05-09 MED ORDER — DOXYCYCLINE HYCLATE 100 MG PO CAPS: 100.0000 mg | ORAL_CAPSULE | Freq: Two times a day (BID) | ORAL | 0 refills | Status: AC
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
## 2020-05-09 NOTE — Discharge Instructions (Addendum)
Your x-ray is negative for fracture involving the ankle or lower left leg and all hardware remains intact. You have been referred to wound care for further management of lower leg wound he suffered during your fall. I would like for you to start doxycycline twice daily for 10 days for skin infection.  For swelling elevate leg and apply ice or cool as tolerated to help reduce swelling. Follow-up with the Coumadin clinic to request your INR be rechecked following completion of your antibiotics.

## 2020-05-09 NOTE — ED Triage Notes (Signed)
Pt presents after falling last Tuesday. Pt states that she fell when she tried to sit down on the bed and missed the bed. Reports hitting her left leg on the bed frame. Denies any head trauma. Patient has a baseball size dark red area on her left lower leg and swelling in her left foot.

## 2020-05-17 ENCOUNTER — Other Ambulatory Visit: Payer: Self-pay

## 2020-05-17 ENCOUNTER — Ambulatory Visit (INDEPENDENT_AMBULATORY_CARE_PROVIDER_SITE_OTHER): Payer: Medicare Other

## 2020-05-17 DIAGNOSIS — Z7901 Long term (current) use of anticoagulants: Secondary | ICD-10-CM | POA: Diagnosis not present

## 2020-05-17 DIAGNOSIS — I4891 Unspecified atrial fibrillation: Secondary | ICD-10-CM | POA: Diagnosis not present

## 2020-05-17 LAB — POCT INR: INR: 2.9 (ref 2.0–3.0)

## 2020-05-17 NOTE — Patient Instructions (Signed)
continue with 1 tablet each Monday and Friday, 1/2 tablet all other days. Repeat INR in 6 weeks.

## 2020-05-21 DIAGNOSIS — L03116 Cellulitis of left lower limb: Secondary | ICD-10-CM | POA: Diagnosis not present

## 2020-05-21 DIAGNOSIS — S8992XD Unspecified injury of left lower leg, subsequent encounter: Secondary | ICD-10-CM | POA: Diagnosis not present

## 2020-05-21 DIAGNOSIS — D6869 Other thrombophilia: Secondary | ICD-10-CM | POA: Diagnosis not present

## 2020-05-21 DIAGNOSIS — W06XXXD Fall from bed, subsequent encounter: Secondary | ICD-10-CM | POA: Diagnosis not present

## 2020-05-28 ENCOUNTER — Encounter: Payer: Self-pay | Admitting: Family

## 2020-05-28 ENCOUNTER — Other Ambulatory Visit: Payer: Self-pay

## 2020-05-28 ENCOUNTER — Inpatient Hospital Stay: Payer: Medicare Other

## 2020-05-28 ENCOUNTER — Inpatient Hospital Stay (HOSPITAL_BASED_OUTPATIENT_CLINIC_OR_DEPARTMENT_OTHER): Payer: Medicare Other | Admitting: Family

## 2020-05-28 VITALS — BP 149/85 | HR 78 | Temp 97.8°F | Resp 19 | Ht 65.0 in | Wt 144.8 lb

## 2020-05-28 DIAGNOSIS — Z79899 Other long term (current) drug therapy: Secondary | ICD-10-CM | POA: Diagnosis not present

## 2020-05-28 DIAGNOSIS — M25473 Effusion, unspecified ankle: Secondary | ICD-10-CM | POA: Diagnosis not present

## 2020-05-28 DIAGNOSIS — C7951 Secondary malignant neoplasm of bone: Secondary | ICD-10-CM

## 2020-05-28 DIAGNOSIS — C50919 Malignant neoplasm of unspecified site of unspecified female breast: Secondary | ICD-10-CM

## 2020-05-28 DIAGNOSIS — Z170 Estrogen receptor positive status [ER+]: Secondary | ICD-10-CM | POA: Diagnosis not present

## 2020-05-28 DIAGNOSIS — Z7901 Long term (current) use of anticoagulants: Secondary | ICD-10-CM | POA: Diagnosis not present

## 2020-05-28 DIAGNOSIS — D508 Other iron deficiency anemias: Secondary | ICD-10-CM | POA: Diagnosis not present

## 2020-05-28 DIAGNOSIS — Z79811 Long term (current) use of aromatase inhibitors: Secondary | ICD-10-CM | POA: Diagnosis not present

## 2020-05-28 DIAGNOSIS — C50912 Malignant neoplasm of unspecified site of left female breast: Secondary | ICD-10-CM

## 2020-05-28 DIAGNOSIS — C50911 Malignant neoplasm of unspecified site of right female breast: Secondary | ICD-10-CM

## 2020-05-28 DIAGNOSIS — R202 Paresthesia of skin: Secondary | ICD-10-CM | POA: Diagnosis not present

## 2020-05-28 DIAGNOSIS — J449 Chronic obstructive pulmonary disease, unspecified: Secondary | ICD-10-CM | POA: Diagnosis not present

## 2020-05-28 LAB — CBC WITH DIFFERENTIAL (CANCER CENTER ONLY)
Abs Immature Granulocytes: 0.05 10*3/uL (ref 0.00–0.07)
Basophils Absolute: 0 10*3/uL (ref 0.0–0.1)
Basophils Relative: 1 %
Eosinophils Absolute: 0.2 10*3/uL (ref 0.0–0.5)
Eosinophils Relative: 4 %
HCT: 29.4 % — ABNORMAL LOW (ref 36.0–46.0)
Hemoglobin: 9.3 g/dL — ABNORMAL LOW (ref 12.0–15.0)
Immature Granulocytes: 1 %
Lymphocytes Relative: 22 %
Lymphs Abs: 0.8 10*3/uL (ref 0.7–4.0)
MCH: 29.8 pg (ref 26.0–34.0)
MCHC: 31.6 g/dL (ref 30.0–36.0)
MCV: 94.2 fL (ref 80.0–100.0)
Monocytes Absolute: 0.4 10*3/uL (ref 0.1–1.0)
Monocytes Relative: 12 %
Neutro Abs: 2.1 10*3/uL (ref 1.7–7.7)
Neutrophils Relative %: 60 %
Platelet Count: 141 10*3/uL — ABNORMAL LOW (ref 150–400)
RBC: 3.12 MIL/uL — ABNORMAL LOW (ref 3.87–5.11)
RDW: 15.9 % — ABNORMAL HIGH (ref 11.5–15.5)
WBC Count: 3.6 10*3/uL — ABNORMAL LOW (ref 4.0–10.5)
nRBC: 0 % (ref 0.0–0.2)

## 2020-05-28 LAB — CMP (CANCER CENTER ONLY)
ALT: 12 U/L (ref 0–44)
AST: 22 U/L (ref 15–41)
Albumin: 3.4 g/dL — ABNORMAL LOW (ref 3.5–5.0)
Alkaline Phosphatase: 96 U/L (ref 38–126)
Anion gap: 6 (ref 5–15)
BUN: 23 mg/dL (ref 8–23)
CO2: 29 mmol/L (ref 22–32)
Calcium: 10.1 mg/dL (ref 8.9–10.3)
Chloride: 106 mmol/L (ref 98–111)
Creatinine: 1.53 mg/dL — ABNORMAL HIGH (ref 0.44–1.00)
GFR, Estimated: 32 mL/min — ABNORMAL LOW (ref >60)
Glucose, Bld: 90 mg/dL (ref 70–99)
Potassium: 4.8 mmol/L (ref 3.5–5.1)
Sodium: 141 mmol/L (ref 135–145)
Total Bilirubin: 1.8 mg/dL — ABNORMAL HIGH (ref 0.3–1.2)
Total Protein: 7.3 g/dL (ref 6.5–8.1)

## 2020-05-28 LAB — RETICULOCYTES
Immature Retic Fract: 8.1 % (ref 2.3–15.9)
RBC.: 3.14 MIL/uL — ABNORMAL LOW (ref 3.87–5.11)
Retic Count, Absolute: 52.4 10*3/uL (ref 19.0–186.0)
Retic Ct Pct: 1.7 % (ref 0.4–3.1)

## 2020-05-28 MED ORDER — FULVESTRANT 250 MG/5ML IM SOLN
500.0000 mg | Freq: Once | INTRAMUSCULAR | Status: AC
  Administered 2020-05-28: 500 mg via INTRAMUSCULAR

## 2020-05-28 MED ORDER — FULVESTRANT 250 MG/5ML IM SOLN
INTRAMUSCULAR | Status: AC
  Filled 2020-05-28: qty 10

## 2020-05-28 NOTE — Patient Instructions (Signed)
Fulvestrant injection What is this medicine? FULVESTRANT (ful VES trant) blocks the effects of estrogen. It is used to treat breast cancer. This medicine may be used for other purposes; ask your health care provider or pharmacist if you have questions. COMMON BRAND NAME(S): FASLODEX What should I tell my health care provider before I take this medicine? They need to know if you have any of these conditions:  bleeding disorders  liver disease  low blood counts, like low white cell, platelet, or red cell counts  an unusual or allergic reaction to fulvestrant, other medicines, foods, dyes, or preservatives  pregnant or trying to get pregnant  breast-feeding How should I use this medicine? This medicine is for injection into a muscle. It is usually given by a health care professional in a hospital or clinic setting. Talk to your pediatrician regarding the use of this medicine in children. Special care may be needed. Overdosage: If you think you have taken too much of this medicine contact a poison control center or emergency room at once. NOTE: This medicine is only for you. Do not share this medicine with others. What if I miss a dose? It is important not to miss your dose. Call your doctor or health care professional if you are unable to keep an appointment. What may interact with this medicine?  medicines that treat or prevent blood clots like warfarin, enoxaparin, dalteparin, apixaban, dabigatran, and rivaroxaban This list may not describe all possible interactions. Give your health care provider a list of all the medicines, herbs, non-prescription drugs, or dietary supplements you use. Also tell them if you smoke, drink alcohol, or use illegal drugs. Some items may interact with your medicine. What should I watch for while using this medicine? Your condition will be monitored carefully while you are receiving this medicine. You will need important blood work done while you are taking  this medicine. Do not become pregnant while taking this medicine or for at least 1 year after stopping it. Women of child-bearing potential will need to have a negative pregnancy test before starting this medicine. Women should inform their doctor if they wish to become pregnant or think they might be pregnant. There is a potential for serious side effects to an unborn child. Men should inform their doctors if they wish to father a child. This medicine may lower sperm counts. Talk to your health care professional or pharmacist for more information. Do not breast-feed an infant while taking this medicine or for 1 year after the last dose. What side effects may I notice from receiving this medicine? Side effects that you should report to your doctor or health care professional as soon as possible:  allergic reactions like skin rash, itching or hives, swelling of the face, lips, or tongue  feeling faint or lightheaded, falls  pain, tingling, numbness, or weakness in the legs  signs and symptoms of infection like fever or chills; cough; flu-like symptoms; sore throat  vaginal bleeding Side effects that usually do not require medical attention (report to your doctor or health care professional if they continue or are bothersome):  aches, pains  constipation  diarrhea  headache  hot flashes  nausea, vomiting  pain at site where injected  stomach pain This list may not describe all possible side effects. Call your doctor for medical advice about side effects. You may report side effects to FDA at 1-800-FDA-1088. Where should I keep my medicine? This drug is given in a hospital or clinic and will   not be stored at home. NOTE: This sheet is a summary. It may not cover all possible information. If you have questions about this medicine, talk to your doctor, pharmacist, or health care provider.  2020 Elsevier/Gold Standard (2017-09-02 11:34:41)  

## 2020-05-28 NOTE — Progress Notes (Signed)
Hematology and Oncology Follow Up Visit  Amy White [id number] 22-Oct-1925 86 y.o. 05/28/2020   Principle Diagnosis:  Metastatic breast cancer - bone only metastasis  Past Therapy: Aromasin 25 mg p.o. Daily -- d/c on 06/15/2018 Ribociclib 200 mg po q day (21/14) -- changed on 03/27/2018 -- patient stopped on her own in 04/2018 Ibrance 75 mg q day (14on/14off)-- d/c Xgeva 120 mg sq q 3 months - on hold permanently due to bad teeth.   Current Therapy: Faslodex 500 mg IM q month - re-started on 09/29/2019   Interim History:  Amy White is here today for follow-up and Faslodex. She slid down between the rails of her bed in late November and has a dime sized wound on her lower left shin. This has been evaluated in the ED and by [facility]. She has completed a round of antibiotic but still has redness, swelling in the left lower extremity around the wound and ankle as well as a serous discharge from the wound. She states that wound care has not followed up and it looks like this may have been cancelled in the computer. She would certainly benefit from them coming to see her. She was also encouraged to follow-up with her PCP.  No fever, chills, n/v, cough, rash, dizziness, chest pain, palpitations, abdominal pain or changes in bowel or bladder habits.  She has noted some fatigue.  No adenopathy.  She has occasional episodes of SOB with over exertion (COPD) and takes a break to rest when needed.  She has intermittent tingling in her feet. Pedal pulses are 2+.  CA 27.29 in October was 66.  She has not noted any blood loss. No abnormal bruising or petechiae. She is currently on Coumadin and her INR is monitored by the local coumadin clinic.  She has maintained a good appetite and is staying well hydrated. Her weight is stable.   ECOG Performance Status: 2 - Symptomatic, <50% confined to bed  Medications:  Allergies as of 05/28/2020      Reactions   Morphine And Related  Rash   Sulfa Antibiotics Other (See Comments)   Mouth breaks out in sores   Ciprofloxacin Rash, Other (See Comments)   Rash in the mouth   Crestor [rosuvastatin] Other (See Comments)   Unknown reaction   Penicillins Hives, Rash, Other (See Comments)   Has patient had a PCN reaction causing immediate rash, facial/tongue/throat swelling, SOB or lightheadedness with hypotension: yes Has patient had a PCN reaction causing severe rash involving mucus membranes or skin necrosis: no Has patient had a PCN reaction that required hospitalization: no Has patient had a PCN reaction occurring within the last 10 years: yes If all of the above answers are "NO", then may proceed with Cephalosporin use.   Vancomycin Rash, Other (See Comments)   "Red-man"      Medication List       Accurate as of May 28, 2020  1:35 PM. If you have any questions, ask your nurse or doctor.        acetaminophen 500 MG tablet Commonly known as: TYLENOL Take 500 mg by mouth every 6 (six) hours as needed for fever.   albuterol 108 (90 Base) MCG/ACT inhaler Commonly known as: VENTOLIN HFA Inhale 2 puffs into the lungs every 6 (six) hours as needed for wheezing or shortness of breath.   ALPRAZolam 0.5 MG tablet Commonly known as: XANAX Take 0.25 mg by mouth 2 (two) times daily as needed for anxiety.  bisoprolol 5 MG tablet Commonly known as: ZEBETA Take 2 tablets (10 mg total) by mouth daily.   diphenoxylate-atropine 2.5-0.025 MG tablet Commonly known as: LOMOTIL TAKE 1 TABLET BY MOUTH AFTER EACH LOOSE STOOL, MAXIMUM DAILY DOSE IS 8 TABLETS   feeding supplement Liqd Take 237 mLs by mouth 2 (two) times daily between meals.   furosemide 40 MG tablet Commonly known as: LASIX Take 20 mg by mouth daily as needed.   levothyroxine 88 MCG tablet Commonly known as: SYNTHROID Take 88 mcg by mouth daily.   palbociclib 75 MG capsule Commonly known as: Ibrance Take 1 capsule (75 mg total) by mouth daily with  breakfast. Take whole with food. Take for 14 days on, 14 days off, repeat every 28 days.   polyethylene glycol 17 g packet Commonly known as: MIRALAX / GLYCOLAX Take 17 g by mouth daily as needed for mild constipation.   potassium chloride 10 MEQ tablet Commonly known as: KLOR-CON Take 2 tablets (20 mEq total) by mouth daily.   UNABLE TO FIND Hair prosthesis   Vitamin D (Ergocalciferol) 1.25 MG (50000 UNIT) Caps capsule Commonly known as: DRISDOL TAKE 1 CAPSULE BY MOUTH 1 TIME A WEEK   warfarin 5 MG tablet Commonly known as: COUMADIN Take as directed by the anticoagulation clinic. If you are unsure how to take this medication, talk to your nurse or doctor. Original instructions: TAKE ONE-HALF TO 1 TABLET BY MOUTH DAILY AS DIRECTED BY COUMADIN CLINIC       Allergies:  Allergies  Allergen Reactions  . Morphine And Related Rash  . Sulfa Antibiotics Other (See Comments)    Mouth breaks out in sores  . Ciprofloxacin Rash and Other (See Comments)    Rash in the mouth  . Crestor [Rosuvastatin] Other (See Comments)    Unknown reaction  . Penicillins Hives, Rash and Other (See Comments)    Has patient had a PCN reaction causing immediate rash, facial/tongue/throat swelling, SOB or lightheadedness with hypotension: yes Has patient had a PCN reaction causing severe rash involving mucus membranes or skin necrosis: no Has patient had a PCN reaction that required hospitalization: no Has patient had a PCN reaction occurring within the last 10 years: yes If all of the above answers are "NO", then may proceed with Cephalosporin use.   . Vancomycin Rash and Other (See Comments)    "Red-man"    Past Medical History, Surgical history, Social history, and Family History were reviewed and updated.  Review of Systems: All other 10 point review of systems is negative.   Physical Exam:  height is 5\' 5"  (1.651 m) and weight is 144 lb 12.8 oz (65.7 kg). Her oral temperature is 97.8 F (36.6  C). Her blood pressure is 149/85 (abnormal) and her pulse is 78. Her respiration is 19.   Wt Readings from Last 3 Encounters:  05/28/20 144 lb 12.8 oz (65.7 kg)  04/04/20 145 lb (65.8 kg)  03/06/20 146 lb 12.8 oz (66.6 kg)    Ocular: Sclerae unicteric, pupils equal, round and reactive to light Ear-nose-throat: Oropharynx clear, dentition fair Lymphatic: No cervical, supraclavicular or axillary adenopathy Lungs no rales or rhonchi, good excursion bilaterally Heart regular rate and rhythm, no murmur appreciated Abd soft, nontender, positive bowel sounds MSK no focal spinal tenderness, no joint edema Neuro: non-focal, well-oriented, appropriate affect Breasts: Deferred   Lab Results  Component Value Date   WBC 3.6 (L) 05/28/2020   HGB 9.3 (L) 05/28/2020   HCT 29.4 (L) 05/28/2020  MCV 94.2 05/28/2020   PLT 141 (L) 05/28/2020   Lab Results  Component Value Date   FERRITIN 108 03/06/2020   IRON 54 03/06/2020   TIBC 263 03/06/2020   UIBC 208 03/06/2020   IRONPCTSAT 21 03/06/2020   Lab Results  Component Value Date   RETICCTPCT 1.7 05/28/2020   RBC 3.12 (L) 05/28/2020   RBC 3.14 (L) 05/28/2020   No results found for: KPAFRELGTCHN, LAMBDASER, KAPLAMBRATIO No results found for: Kandis Cocking, IGMSERUM No results found for: Odetta Pink, SPEI   Chemistry      Component Value Date/Time   NA 139 04/04/2020 1344   NA 142 03/22/2018 1638   NA 146 (H) 01/25/2017 1448   NA 142 07/31/2015 1203   K 4.3 04/04/2020 1344   K 4.0 01/25/2017 1448   K 5.1 07/31/2015 1203   CL 103 04/04/2020 1344   CL 108 01/25/2017 1448   CO2 30 04/04/2020 1344   CO2 29 01/25/2017 1448   CO2 23 07/31/2015 1203   BUN 18 04/04/2020 1344   BUN 13 03/22/2018 1638   BUN 18 01/25/2017 1448   BUN 23.3 07/31/2015 1203   CREATININE 1.57 (H) 04/04/2020 1344   CREATININE 1.2 01/25/2017 1448   CREATININE 1.1 07/31/2015 1203      Component Value  Date/Time   CALCIUM 10.4 (H) 04/04/2020 1344   CALCIUM 9.3 01/25/2017 1448   CALCIUM 9.3 07/31/2015 1203   ALKPHOS 98 04/04/2020 1344   ALKPHOS 64 01/25/2017 1448   ALKPHOS 57 07/31/2015 1203   AST 21 04/04/2020 1344   AST 18 07/31/2015 1203   ALT 8 04/04/2020 1344   ALT 20 01/25/2017 1448   ALT 15 07/31/2015 1203   BILITOT 2.2 (H) 04/04/2020 1344   BILITOT 0.96 07/31/2015 1203       Impression and Plan: Ms. Jaggers is a very pleasant 86 yo caucasian female with metastatic breast cancer still confined to the bones.  She received Faslodex today as planned.  Epo level and iron studies are pending. She may benefit from an ESA or iron as treatment for her anemia.   Follow-up in 1 month.  She was encouraged to contact our office with any questions or concerns.   Laverna Peace, NP 12/21/20211:35 PM

## 2020-05-29 ENCOUNTER — Telehealth: Payer: Self-pay | Admitting: Family

## 2020-05-29 LAB — IRON AND TIBC
Iron: 47 ug/dL (ref 41–142)
Saturation Ratios: 18 % — ABNORMAL LOW (ref 21–57)
TIBC: 260 ug/dL (ref 236–444)
UIBC: 213 ug/dL (ref 120–384)

## 2020-05-29 LAB — ERYTHROPOIETIN: Erythropoietin: 17.5 m[IU]/mL (ref 2.6–18.5)

## 2020-05-29 LAB — CANCER ANTIGEN 27.29: CA 27.29: 58.8 U/mL — ABNORMAL HIGH (ref 0.0–38.6)

## 2020-05-29 LAB — FERRITIN: Ferritin: 131 ng/mL (ref 11–307)

## 2020-05-29 NOTE — Telephone Encounter (Signed)
Appointments scheduled calendar printed & mailed per 12/21 los 

## 2020-06-03 ENCOUNTER — Telehealth: Payer: Self-pay | Admitting: Family

## 2020-06-03 NOTE — Telephone Encounter (Signed)
Called and LMVM for patient regarding appointments scheduled per 12/22 sch msg

## 2020-06-04 NOTE — Progress Notes (Signed)
..  The following Medication: Faslodex has been approved for re-enrollment thru [employer] [place]. No interruptions of services, original program expires 06/07/2020. Approval extended to 06/08/2020 - 06/07/2021. Reason for Assist: EOOP.  Next DOS: 07/02/2019.

## 2020-06-12 ENCOUNTER — Inpatient Hospital Stay: Payer: Medicare Other

## 2020-06-12 ENCOUNTER — Other Ambulatory Visit: Payer: Self-pay

## 2020-06-12 ENCOUNTER — Inpatient Hospital Stay: Payer: Medicare Other | Attending: Hematology

## 2020-06-12 VITALS — BP 123/55 | HR 78 | Temp 98.6°F | Resp 18

## 2020-06-12 DIAGNOSIS — Z79811 Long term (current) use of aromatase inhibitors: Secondary | ICD-10-CM | POA: Diagnosis not present

## 2020-06-12 DIAGNOSIS — R5381 Other malaise: Secondary | ICD-10-CM | POA: Insufficient documentation

## 2020-06-12 DIAGNOSIS — D509 Iron deficiency anemia, unspecified: Secondary | ICD-10-CM | POA: Diagnosis not present

## 2020-06-12 DIAGNOSIS — C7951 Secondary malignant neoplasm of bone: Secondary | ICD-10-CM | POA: Insufficient documentation

## 2020-06-12 DIAGNOSIS — R5383 Other fatigue: Secondary | ICD-10-CM | POA: Diagnosis not present

## 2020-06-12 DIAGNOSIS — C50919 Malignant neoplasm of unspecified site of unspecified female breast: Secondary | ICD-10-CM

## 2020-06-12 DIAGNOSIS — Z170 Estrogen receptor positive status [ER+]: Secondary | ICD-10-CM | POA: Insufficient documentation

## 2020-06-12 DIAGNOSIS — C50911 Malignant neoplasm of unspecified site of right female breast: Secondary | ICD-10-CM | POA: Insufficient documentation

## 2020-06-12 DIAGNOSIS — Z79899 Other long term (current) drug therapy: Secondary | ICD-10-CM | POA: Diagnosis not present

## 2020-06-12 LAB — CBC WITH DIFFERENTIAL (CANCER CENTER ONLY)
Abs Immature Granulocytes: 0.01 10*3/uL (ref 0.00–0.07)
Basophils Absolute: 0 10*3/uL (ref 0.0–0.1)
Basophils Relative: 1 %
Eosinophils Absolute: 0.1 10*3/uL (ref 0.0–0.5)
Eosinophils Relative: 5 %
HCT: 28 % — ABNORMAL LOW (ref 36.0–46.0)
Hemoglobin: 9 g/dL — ABNORMAL LOW (ref 12.0–15.0)
Immature Granulocytes: 1 %
Lymphocytes Relative: 21 %
Lymphs Abs: 0.5 10*3/uL — ABNORMAL LOW (ref 0.7–4.0)
MCH: 30.6 pg (ref 26.0–34.0)
MCHC: 32.1 g/dL (ref 30.0–36.0)
MCV: 95.2 fL (ref 80.0–100.0)
Monocytes Absolute: 0.4 10*3/uL (ref 0.1–1.0)
Monocytes Relative: 17 %
Neutro Abs: 1.2 10*3/uL — ABNORMAL LOW (ref 1.7–7.7)
Neutrophils Relative %: 55 %
Platelet Count: 102 10*3/uL — ABNORMAL LOW (ref 150–400)
RBC: 2.94 MIL/uL — ABNORMAL LOW (ref 3.87–5.11)
RDW: 16 % — ABNORMAL HIGH (ref 11.5–15.5)
WBC Count: 2.2 10*3/uL — ABNORMAL LOW (ref 4.0–10.5)
nRBC: 0 % (ref 0.0–0.2)

## 2020-06-12 MED ORDER — SODIUM CHLORIDE 0.9 % IV SOLN
Freq: Once | INTRAVENOUS | Status: AC
Start: 2020-06-12 — End: 2020-06-12
  Filled 2020-06-12: qty 250

## 2020-06-12 MED ORDER — SODIUM CHLORIDE 0.9 % IV SOLN
200.0000 mg | Freq: Once | INTRAVENOUS | Status: AC
  Administered 2020-06-12: 200 mg via INTRAVENOUS
  Filled 2020-06-12: qty 200

## 2020-06-12 NOTE — Patient Instructions (Signed)

## 2020-06-14 ENCOUNTER — Inpatient Hospital Stay: Payer: Medicare Other

## 2020-06-18 ENCOUNTER — Telehealth: Payer: Self-pay

## 2020-06-18 ENCOUNTER — Other Ambulatory Visit: Payer: Self-pay

## 2020-06-18 ENCOUNTER — Inpatient Hospital Stay: Payer: Medicare Other

## 2020-06-18 VITALS — BP 139/65 | HR 81 | Temp 98.7°F | Resp 18

## 2020-06-18 DIAGNOSIS — C7951 Secondary malignant neoplasm of bone: Secondary | ICD-10-CM

## 2020-06-18 DIAGNOSIS — C50911 Malignant neoplasm of unspecified site of right female breast: Secondary | ICD-10-CM | POA: Diagnosis not present

## 2020-06-18 DIAGNOSIS — C50919 Malignant neoplasm of unspecified site of unspecified female breast: Secondary | ICD-10-CM

## 2020-06-18 MED ORDER — SODIUM CHLORIDE 0.9 % IV SOLN
200.0000 mg | Freq: Once | INTRAVENOUS | Status: AC
  Administered 2020-06-18: 200 mg via INTRAVENOUS
  Filled 2020-06-18: qty 10

## 2020-06-18 MED ORDER — SODIUM CHLORIDE 0.9 % IV SOLN
Freq: Once | INTRAVENOUS | Status: AC
  Filled 2020-06-18: qty 250

## 2020-06-18 NOTE — Telephone Encounter (Signed)
Pt had called in to check status of todays appts and to see if she really needed to come in, per vanessa today is the 2nd of two iron tx, pt is aware and will keep todays appts     aom

## 2020-07-01 ENCOUNTER — Other Ambulatory Visit: Payer: Medicare Other

## 2020-07-01 ENCOUNTER — Inpatient Hospital Stay: Payer: Medicare Other

## 2020-07-01 ENCOUNTER — Ambulatory Visit: Payer: Medicare Other | Admitting: Family

## 2020-07-01 ENCOUNTER — Inpatient Hospital Stay: Payer: Medicare Other | Admitting: Hematology & Oncology

## 2020-07-01 ENCOUNTER — Ambulatory Visit: Payer: Medicare Other

## 2020-07-03 ENCOUNTER — Inpatient Hospital Stay: Payer: Medicare Other

## 2020-07-03 ENCOUNTER — Inpatient Hospital Stay (HOSPITAL_BASED_OUTPATIENT_CLINIC_OR_DEPARTMENT_OTHER): Payer: Medicare Other | Admitting: Hematology & Oncology

## 2020-07-03 ENCOUNTER — Other Ambulatory Visit: Payer: Self-pay

## 2020-07-03 ENCOUNTER — Encounter: Payer: Self-pay | Admitting: Hematology & Oncology

## 2020-07-03 VITALS — BP 132/73 | HR 78 | Temp 97.8°F | Resp 20 | Wt 142.0 lb

## 2020-07-03 DIAGNOSIS — D508 Other iron deficiency anemias: Secondary | ICD-10-CM

## 2020-07-03 DIAGNOSIS — C50911 Malignant neoplasm of unspecified site of right female breast: Secondary | ICD-10-CM

## 2020-07-03 DIAGNOSIS — Z170 Estrogen receptor positive status [ER+]: Secondary | ICD-10-CM | POA: Diagnosis not present

## 2020-07-03 DIAGNOSIS — C7951 Secondary malignant neoplasm of bone: Secondary | ICD-10-CM | POA: Diagnosis not present

## 2020-07-03 DIAGNOSIS — I480 Paroxysmal atrial fibrillation: Secondary | ICD-10-CM

## 2020-07-03 DIAGNOSIS — D509 Iron deficiency anemia, unspecified: Secondary | ICD-10-CM | POA: Diagnosis not present

## 2020-07-03 DIAGNOSIS — Z79899 Other long term (current) drug therapy: Secondary | ICD-10-CM | POA: Diagnosis not present

## 2020-07-03 DIAGNOSIS — C50919 Malignant neoplasm of unspecified site of unspecified female breast: Secondary | ICD-10-CM

## 2020-07-03 DIAGNOSIS — C50912 Malignant neoplasm of unspecified site of left female breast: Secondary | ICD-10-CM

## 2020-07-03 DIAGNOSIS — R5381 Other malaise: Secondary | ICD-10-CM | POA: Diagnosis not present

## 2020-07-03 DIAGNOSIS — Z79811 Long term (current) use of aromatase inhibitors: Secondary | ICD-10-CM | POA: Diagnosis not present

## 2020-07-03 DIAGNOSIS — R5383 Other fatigue: Secondary | ICD-10-CM | POA: Diagnosis not present

## 2020-07-03 LAB — CBC WITH DIFFERENTIAL (CANCER CENTER ONLY)
Abs Immature Granulocytes: 0.01 10*3/uL (ref 0.00–0.07)
Basophils Absolute: 0.1 10*3/uL (ref 0.0–0.1)
Basophils Relative: 1 %
Eosinophils Absolute: 0.2 10*3/uL (ref 0.0–0.5)
Eosinophils Relative: 4 %
HCT: 30.4 % — ABNORMAL LOW (ref 36.0–46.0)
Hemoglobin: 9.8 g/dL — ABNORMAL LOW (ref 12.0–15.0)
Immature Granulocytes: 0 %
Lymphocytes Relative: 17 %
Lymphs Abs: 0.7 10*3/uL (ref 0.7–4.0)
MCH: 30.8 pg (ref 26.0–34.0)
MCHC: 32.2 g/dL (ref 30.0–36.0)
MCV: 95.6 fL (ref 80.0–100.0)
Monocytes Absolute: 0.4 10*3/uL (ref 0.1–1.0)
Monocytes Relative: 11 %
Neutro Abs: 2.6 10*3/uL (ref 1.7–7.7)
Neutrophils Relative %: 67 %
Platelet Count: 131 10*3/uL — ABNORMAL LOW (ref 150–400)
RBC: 3.18 MIL/uL — ABNORMAL LOW (ref 3.87–5.11)
RDW: 16.6 % — ABNORMAL HIGH (ref 11.5–15.5)
WBC Count: 3.9 10*3/uL — ABNORMAL LOW (ref 4.0–10.5)
nRBC: 0 % (ref 0.0–0.2)

## 2020-07-03 LAB — CMP (CANCER CENTER ONLY)
ALT: 10 U/L (ref 0–44)
AST: 22 U/L (ref 15–41)
Albumin: 3.3 g/dL — ABNORMAL LOW (ref 3.5–5.0)
Alkaline Phosphatase: 119 U/L (ref 38–126)
Anion gap: 6 (ref 5–15)
BUN: 24 mg/dL — ABNORMAL HIGH (ref 8–23)
CO2: 30 mmol/L (ref 22–32)
Calcium: 10.3 mg/dL (ref 8.9–10.3)
Chloride: 106 mmol/L (ref 98–111)
Creatinine: 1.45 mg/dL — ABNORMAL HIGH (ref 0.44–1.00)
GFR, Estimated: 34 mL/min — ABNORMAL LOW (ref >60)
Glucose, Bld: 105 mg/dL — ABNORMAL HIGH (ref 70–99)
Potassium: 3.7 mmol/L (ref 3.5–5.1)
Sodium: 142 mmol/L (ref 135–145)
Total Bilirubin: 1.6 mg/dL — ABNORMAL HIGH (ref 0.3–1.2)
Total Protein: 7 g/dL (ref 6.5–8.1)

## 2020-07-03 LAB — RETICULOCYTES
Immature Retic Fract: 12.5 % (ref 2.3–15.9)
RBC.: 3.17 MIL/uL — ABNORMAL LOW (ref 3.87–5.11)
Retic Count, Absolute: 76.7 10*3/uL (ref 19.0–186.0)
Retic Ct Pct: 2.4 % (ref 0.4–3.1)

## 2020-07-03 MED ORDER — FULVESTRANT 250 MG/5ML IM SOLN
INTRAMUSCULAR | Status: AC
  Filled 2020-07-03: qty 5

## 2020-07-03 MED ORDER — FULVESTRANT 250 MG/5ML IM SOLN
500.0000 mg | Freq: Once | INTRAMUSCULAR | Status: AC
  Administered 2020-07-03: 500 mg via INTRAMUSCULAR

## 2020-07-03 NOTE — Progress Notes (Signed)
Hematology and Oncology Follow Up Visit  Amy White [id number] 04/12/1926 87 y.o. 07/03/2020   Principle Diagnosis:  Metastatic breast cancer - bone only metastasis  Past Therapy: Aromasin 25 mg p.o. Daily -- d/c on 06/15/2018 Ribociclib 200 mg po q day (21/14) -- changed on 03/27/2018 -- patient stopped on her own in 04/2018 Ibrance 75 mg q day (14on/14off)-- d/c Xgeva 120 mg sq q 3 months - on hold permanently due to bad teeth.   Current Therapy: Faslodex 500 mg IM q month - re-start on 09/29/2019    Interim History:  Ms. Tracey is here today for follow-up and Faslodex. Overall, she is doing okay. She really is about the same. She has had no flareups of her congestive heart failure or atrial fibrillation.  She does have some fatigue. There is no increased shortness of breath. She has had problems with pleural effusions in the past. The effusions have not been malignant.  She is anemic. She is chronically anemic.  Her erythropoietin level is only 17.5.  As such, we might be able to use ESA if necessary.    We did do a bone scan on her. This was done in early October. Thankfully the bone scan showed no progression of her disease. She has stability of her disease which I think is fantastic given she is only on Faslodex.  Her last CA 27.29 was stable at 58.   She is eating okay.  She seemed to do okay over the holiday season.    She has had the osteoarthritis. She has horrible osteoarthritis.  She has had no problems with bowels or bladder.  Overall, her performance status is ECOG 2-3.   Medications:  Allergies as of 07/03/2020      Reactions   Morphine And Related Rash   Sulfa Antibiotics Other (See Comments)   Mouth breaks out in sores   Ciprofloxacin Rash, Other (See Comments)   Rash in the mouth   Crestor [rosuvastatin] Other (See Comments)   Unknown reaction   Penicillins Hives, Rash, Other (See Comments)   Has patient had a PCN reaction causing  immediate rash, facial/tongue/throat swelling, SOB or lightheadedness with hypotension: yes Has patient had a PCN reaction causing severe rash involving mucus membranes or skin necrosis: no Has patient had a PCN reaction that required hospitalization: no Has patient had a PCN reaction occurring within the last 10 years: yes If all of the above answers are "NO", then may proceed with Cephalosporin use.   Vancomycin Rash, Other (See Comments)   "Red-man"      Medication List       Accurate as of July 03, 2020  3:52 PM. If you have any questions, ask your nurse or doctor.        STOP taking these medications   palbociclib 75 MG capsule Commonly known as: Community education officer Stopped by: Volanda Napoleon, MD     TAKE these medications   acetaminophen 500 MG tablet Commonly known as: TYLENOL Take 500 mg by mouth every 6 (six) hours as needed for fever.   albuterol 108 (90 Base) MCG/ACT inhaler Commonly known as: VENTOLIN HFA Inhale 2 puffs into the lungs every 6 (six) hours as needed for wheezing or shortness of breath.   ALPRAZolam 0.5 MG tablet Commonly known as: XANAX Take 0.25 mg by mouth 2 (two) times daily as needed for anxiety.   bisoprolol 5 MG tablet Commonly known as: ZEBETA Take 2 tablets (10 mg total) by mouth daily. What  changed: how much to take   diphenoxylate-atropine 2.5-0.025 MG tablet Commonly known as: LOMOTIL TAKE 1 TABLET BY MOUTH AFTER EACH LOOSE STOOL, MAXIMUM DAILY DOSE IS 8 TABLETS   feeding supplement Liqd Take 237 mLs by mouth 2 (two) times daily between meals.   furosemide 40 MG tablet Commonly known as: LASIX Take 20 mg by mouth daily as needed.   levothyroxine 88 MCG tablet Commonly known as: SYNTHROID Take 88 mcg by mouth daily.   polyethylene glycol 17 g packet Commonly known as: MIRALAX / GLYCOLAX Take 17 g by mouth daily as needed for mild constipation.   potassium chloride 10 MEQ tablet Commonly known as: KLOR-CON Take 2 tablets (20 mEq  total) by mouth daily.   UNABLE TO FIND Hair prosthesis   Vitamin D (Ergocalciferol) 1.25 MG (50000 UNIT) Caps capsule Commonly known as: DRISDOL TAKE 1 CAPSULE BY MOUTH 1 TIME A WEEK   warfarin 5 MG tablet Commonly known as: COUMADIN Take as directed by the anticoagulation clinic. If you are unsure how to take this medication, talk to your nurse or doctor. Original instructions: TAKE ONE-HALF TO 1 TABLET BY MOUTH DAILY AS DIRECTED BY COUMADIN CLINIC What changed: additional instructions       Allergies:  Allergies  Allergen Reactions  . Morphine And Related Rash  . Sulfa Antibiotics Other (See Comments)    Mouth breaks out in sores  . Ciprofloxacin Rash and Other (See Comments)    Rash in the mouth  . Crestor [Rosuvastatin] Other (See Comments)    Unknown reaction  . Penicillins Hives, Rash and Other (See Comments)    Has patient had a PCN reaction causing immediate rash, facial/tongue/throat swelling, SOB or lightheadedness with hypotension: yes Has patient had a PCN reaction causing severe rash involving mucus membranes or skin necrosis: no Has patient had a PCN reaction that required hospitalization: no Has patient had a PCN reaction occurring within the last 10 years: yes If all of the above answers are "NO", then may proceed with Cephalosporin use.   . Vancomycin Rash and Other (See Comments)    "Red-man"    Past Medical History, Surgical history, Social history, and Family History were reviewed and updated.  Review of Systems: Review of Systems  Constitutional: Positive for malaise/fatigue.  HENT: Negative.   Eyes: Negative.   Respiratory: Negative.   Cardiovascular: Positive for chest pain.  Gastrointestinal: Negative.   Genitourinary: Negative.   Musculoskeletal: Positive for joint pain.  Skin: Negative.   Neurological: Negative.   Endo/Heme/Allergies: Negative.   Psychiatric/Behavioral: Negative.    Physical Exam:  weight is 142 lb (64.4 kg). Her  oral temperature is 97.8 F (36.6 C). Her blood pressure is 132/73 and her pulse is 78. Her respiration is 20 and oxygen saturation is 97%.   Wt Readings from Last 3 Encounters:  07/03/20 142 lb (64.4 kg)  05/28/20 144 lb 12.8 oz (65.7 kg)  04/04/20 145 lb (65.8 kg)    Physical Exam Vitals reviewed.  HENT:     Head: Normocephalic and atraumatic.  Eyes:     Pupils: Pupils are equal, round, and reactive to light.  Cardiovascular:     Rate and Rhythm: Normal rate and regular rhythm.     Heart sounds: Normal heart sounds.     Comments: Cardiac exam shows irregular rate and irregular rhythm consistent with atrial fibrillation.  The rate is well controlled.  She has no murmurs. Pulmonary:     Effort: Pulmonary effort is normal.  Breath sounds: Normal breath sounds.  Abdominal:     General: Bowel sounds are normal.     Palpations: Abdomen is soft.  Musculoskeletal:        General: No tenderness or deformity. Normal range of motion.     Cervical back: Normal range of motion.     Comments: Extremities shows arthritic changes.  She has bad arthritic changes in her hands.  She has joint deformities on both hands.  Lymphadenopathy:     Cervical: No cervical adenopathy.  Skin:    General: Skin is warm and dry.     Findings: No erythema or rash.  Neurological:     Mental Status: She is alert and oriented to person, place, and time.  Psychiatric:        Behavior: Behavior normal.        Thought Content: Thought content normal.        Judgment: Judgment normal.       Lab Results  Component Value Date   WBC 3.9 (L) 07/03/2020   HGB 9.8 (L) 07/03/2020   HCT 30.4 (L) 07/03/2020   MCV 95.6 07/03/2020   PLT 131 (L) 07/03/2020   Lab Results  Component Value Date   FERRITIN 131 05/28/2020   IRON 47 05/28/2020   TIBC 260 05/28/2020   UIBC 213 05/28/2020   IRONPCTSAT 18 (L) 05/28/2020   Lab Results  Component Value Date   RETICCTPCT 2.4 07/03/2020   RBC 3.17 (L) 07/03/2020    No results found for: KPAFRELGTCHN, LAMBDASER, KAPLAMBRATIO No results found for: Kandis Cocking, IGMSERUM No results found for: Odetta Pink, SPEI   Chemistry      Component Value Date/Time   NA 142 07/03/2020 1459   NA 142 03/22/2018 1638   NA 146 (H) 01/25/2017 1448   NA 142 07/31/2015 1203   K 3.7 07/03/2020 1459   K 4.0 01/25/2017 1448   K 5.1 07/31/2015 1203   CL 106 07/03/2020 1459   CL 108 01/25/2017 1448   CO2 30 07/03/2020 1459   CO2 29 01/25/2017 1448   CO2 23 07/31/2015 1203   BUN 24 (H) 07/03/2020 1459   BUN 13 03/22/2018 1638   BUN 18 01/25/2017 1448   BUN 23.3 07/31/2015 1203   CREATININE 1.45 (H) 07/03/2020 1459   CREATININE 1.2 01/25/2017 1448   CREATININE 1.1 07/31/2015 1203      Component Value Date/Time   CALCIUM 10.3 07/03/2020 1459   CALCIUM 9.3 01/25/2017 1448   CALCIUM 9.3 07/31/2015 1203   ALKPHOS 119 07/03/2020 1459   ALKPHOS 64 01/25/2017 1448   ALKPHOS 57 07/31/2015 1203   AST 22 07/03/2020 1459   AST 18 07/31/2015 1203   ALT 10 07/03/2020 1459   ALT 20 01/25/2017 1448   ALT 15 07/31/2015 1203   BILITOT 1.6 (H) 07/03/2020 1459   BILITOT 0.96 07/31/2015 1203      Impression and Plan: Ms. Cavan is a very pleasant 87 yo caucasian female with metastatic breast cancer still confined to the bones.   I am is very impressed by the fact that she does not have bone disease progression. I really think that her breast cancer is not going to be the cause of her demise. I really believe that her heart is going to be what is going to cause issues for her.  Again quality of life is really the primary goal for Ms. Penrose.  I would like to see her back  in 4 weeks.   Volanda Napoleon, MD 1/26/20223:52 PM

## 2020-07-03 NOTE — Progress Notes (Signed)
4:38 PM Patient left accompanied with RN to car.No acute distress.

## 2020-07-03 NOTE — Patient Instructions (Signed)
Fulvestrant injection What is this medicine? FULVESTRANT (ful VES trant) blocks the effects of estrogen. It is used to treat breast cancer. This medicine may be used for other purposes; ask your health care provider or pharmacist if you have questions. COMMON BRAND NAME(S): FASLODEX What should I tell my health care provider before I take this medicine? They need to know if you have any of these conditions:  bleeding disorders  liver disease  low blood counts, like low white cell, platelet, or red cell counts  an unusual or allergic reaction to fulvestrant, other medicines, foods, dyes, or preservatives  pregnant or trying to get pregnant  breast-feeding How should I use this medicine? This medicine is for injection into a muscle. It is usually given by a health care professional in a hospital or clinic setting. Talk to your pediatrician regarding the use of this medicine in children. Special care may be needed. Overdosage: If you think you have taken too much of this medicine contact a poison control center or emergency room at once. NOTE: This medicine is only for you. Do not share this medicine with others. What if I miss a dose? It is important not to miss your dose. Call your doctor or health care professional if you are unable to keep an appointment. What may interact with this medicine?  medicines that treat or prevent blood clots like warfarin, enoxaparin, dalteparin, apixaban, dabigatran, and rivaroxaban This list may not describe all possible interactions. Give your health care provider a list of all the medicines, herbs, non-prescription drugs, or dietary supplements you use. Also tell them if you smoke, drink alcohol, or use illegal drugs. Some items may interact with your medicine. What should I watch for while using this medicine? Your condition will be monitored carefully while you are receiving this medicine. You will need important blood work done while you are taking  this medicine. Do not become pregnant while taking this medicine or for at least 1 year after stopping it. Women of child-bearing potential will need to have a negative pregnancy test before starting this medicine. Women should inform their doctor if they wish to become pregnant or think they might be pregnant. There is a potential for serious side effects to an unborn child. Men should inform their doctors if they wish to father a child. This medicine may lower sperm counts. Talk to your health care professional or pharmacist for more information. Do not breast-feed an infant while taking this medicine or for 1 year after the last dose. What side effects may I notice from receiving this medicine? Side effects that you should report to your doctor or health care professional as soon as possible:  allergic reactions like skin rash, itching or hives, swelling of the face, lips, or tongue  feeling faint or lightheaded, falls  pain, tingling, numbness, or weakness in the legs  signs and symptoms of infection like fever or chills; cough; flu-like symptoms; sore throat  vaginal bleeding Side effects that usually do not require medical attention (report to your doctor or health care professional if they continue or are bothersome):  aches, pains  constipation  diarrhea  headache  hot flashes  nausea, vomiting  pain at site where injected  stomach pain This list may not describe all possible side effects. Call your doctor for medical advice about side effects. You may report side effects to FDA at 1-800-FDA-1088. Where should I keep my medicine? This drug is given in a hospital or clinic and will   not be stored at home. NOTE: This sheet is a summary. It may not cover all possible information. If you have questions about this medicine, talk to your doctor, pharmacist, or health care provider.  2021 Elsevier/Gold Standard (2017-09-02 11:34:41)  

## 2020-07-04 ENCOUNTER — Telehealth: Payer: Self-pay | Admitting: Hematology & Oncology

## 2020-07-04 LAB — CANCER ANTIGEN 27.29: CA 27.29: 61.9 U/mL — ABNORMAL HIGH (ref 0.0–38.6)

## 2020-07-04 LAB — IRON AND TIBC
Iron: 69 ug/dL (ref 41–142)
Saturation Ratios: 32 % (ref 21–57)
TIBC: 213 ug/dL — ABNORMAL LOW (ref 236–444)
UIBC: 144 ug/dL (ref 120–384)

## 2020-07-04 LAB — FERRITIN: Ferritin: 259 ng/mL (ref 11–307)

## 2020-07-04 NOTE — Telephone Encounter (Signed)
Called and spoke with patient about appointments that were added per 1/26 los

## 2020-07-15 ENCOUNTER — Telehealth: Payer: Self-pay

## 2020-07-15 NOTE — Telephone Encounter (Signed)
lmom for overdue inr 

## 2020-07-22 ENCOUNTER — Other Ambulatory Visit: Payer: Self-pay

## 2020-07-22 ENCOUNTER — Ambulatory Visit (INDEPENDENT_AMBULATORY_CARE_PROVIDER_SITE_OTHER): Payer: Medicare Other

## 2020-07-22 DIAGNOSIS — Z7901 Long term (current) use of anticoagulants: Secondary | ICD-10-CM

## 2020-07-22 DIAGNOSIS — I4891 Unspecified atrial fibrillation: Secondary | ICD-10-CM

## 2020-07-22 LAB — POCT INR: INR: 2.3 (ref 2.0–3.0)

## 2020-07-22 NOTE — Patient Instructions (Signed)
continue with 1 tablet each Monday and Friday, 1/2 tablet all other days. Repeat INR in 6 weeks.

## 2020-07-30 ENCOUNTER — Other Ambulatory Visit: Payer: Self-pay

## 2020-07-30 MED ORDER — WARFARIN SODIUM 5 MG PO TABS: ORAL_TABLET | ORAL | 0 refills | Status: AC

## 2020-07-31 ENCOUNTER — Telehealth: Payer: Self-pay

## 2020-07-31 ENCOUNTER — Inpatient Hospital Stay (HOSPITAL_BASED_OUTPATIENT_CLINIC_OR_DEPARTMENT_OTHER): Payer: Medicare Other | Admitting: Family

## 2020-07-31 ENCOUNTER — Encounter: Payer: Self-pay | Admitting: Family

## 2020-07-31 ENCOUNTER — Inpatient Hospital Stay: Payer: Medicare Other

## 2020-07-31 ENCOUNTER — Other Ambulatory Visit: Payer: Self-pay

## 2020-07-31 ENCOUNTER — Inpatient Hospital Stay: Payer: Medicare Other | Attending: Hematology

## 2020-07-31 VITALS — BP 147/68 | HR 72 | Temp 98.2°F | Resp 20 | Ht 65.0 in | Wt 144.0 lb

## 2020-07-31 DIAGNOSIS — C50911 Malignant neoplasm of unspecified site of right female breast: Secondary | ICD-10-CM

## 2020-07-31 DIAGNOSIS — Z79811 Long term (current) use of aromatase inhibitors: Secondary | ICD-10-CM | POA: Insufficient documentation

## 2020-07-31 DIAGNOSIS — C7951 Secondary malignant neoplasm of bone: Secondary | ICD-10-CM

## 2020-07-31 DIAGNOSIS — C50919 Malignant neoplasm of unspecified site of unspecified female breast: Secondary | ICD-10-CM | POA: Diagnosis not present

## 2020-07-31 DIAGNOSIS — Z5111 Encounter for antineoplastic chemotherapy: Secondary | ICD-10-CM | POA: Diagnosis present

## 2020-07-31 DIAGNOSIS — I480 Paroxysmal atrial fibrillation: Secondary | ICD-10-CM

## 2020-07-31 DIAGNOSIS — D508 Other iron deficiency anemias: Secondary | ICD-10-CM

## 2020-07-31 DIAGNOSIS — Z170 Estrogen receptor positive status [ER+]: Secondary | ICD-10-CM | POA: Insufficient documentation

## 2020-07-31 DIAGNOSIS — Z9012 Acquired absence of left breast and nipple: Secondary | ICD-10-CM | POA: Insufficient documentation

## 2020-07-31 LAB — CBC WITH DIFFERENTIAL (CANCER CENTER ONLY)
Abs Immature Granulocytes: 0.02 10*3/uL (ref 0.00–0.07)
Basophils Absolute: 0 10*3/uL (ref 0.0–0.1)
Basophils Relative: 1 %
Eosinophils Absolute: 0.3 10*3/uL (ref 0.0–0.5)
Eosinophils Relative: 8 %
HCT: 28.6 % — ABNORMAL LOW (ref 36.0–46.0)
Hemoglobin: 9.1 g/dL — ABNORMAL LOW (ref 12.0–15.0)
Immature Granulocytes: 1 %
Lymphocytes Relative: 20 %
Lymphs Abs: 0.6 10*3/uL — ABNORMAL LOW (ref 0.7–4.0)
MCH: 31 pg (ref 26.0–34.0)
MCHC: 31.8 g/dL (ref 30.0–36.0)
MCV: 97.3 fL (ref 80.0–100.0)
Monocytes Absolute: 0.3 10*3/uL (ref 0.1–1.0)
Monocytes Relative: 10 %
Neutro Abs: 1.9 10*3/uL (ref 1.7–7.7)
Neutrophils Relative %: 60 %
Platelet Count: 120 10*3/uL — ABNORMAL LOW (ref 150–400)
RBC: 2.94 MIL/uL — ABNORMAL LOW (ref 3.87–5.11)
RDW: 16.2 % — ABNORMAL HIGH (ref 11.5–15.5)
WBC Count: 3.1 10*3/uL — ABNORMAL LOW (ref 4.0–10.5)
nRBC: 0 % (ref 0.0–0.2)

## 2020-07-31 LAB — CMP (CANCER CENTER ONLY)
ALT: 10 U/L (ref 0–44)
AST: 22 U/L (ref 15–41)
Albumin: 3.5 g/dL (ref 3.5–5.0)
Alkaline Phosphatase: 133 U/L — ABNORMAL HIGH (ref 38–126)
Anion gap: 7 (ref 5–15)
BUN: 25 mg/dL — ABNORMAL HIGH (ref 8–23)
CO2: 27 mmol/L (ref 22–32)
Calcium: 10.2 mg/dL (ref 8.9–10.3)
Chloride: 107 mmol/L (ref 98–111)
Creatinine: 1.51 mg/dL — ABNORMAL HIGH (ref 0.44–1.00)
GFR, Estimated: 32 mL/min — ABNORMAL LOW (ref >60)
Glucose, Bld: 111 mg/dL — ABNORMAL HIGH (ref 70–99)
Potassium: 4.4 mmol/L (ref 3.5–5.1)
Sodium: 141 mmol/L (ref 135–145)
Total Bilirubin: 1.6 mg/dL — ABNORMAL HIGH (ref 0.3–1.2)
Total Protein: 7.1 g/dL (ref 6.5–8.1)

## 2020-07-31 MED ORDER — FULVESTRANT 250 MG/5ML IM SOLN
500.0000 mg | Freq: Once | INTRAMUSCULAR | Status: AC
  Administered 2020-07-31: 500 mg via INTRAMUSCULAR

## 2020-07-31 NOTE — Patient Instructions (Signed)
Fulvestrant injection What is this medicine? FULVESTRANT (ful VES trant) blocks the effects of estrogen. It is used to treat breast cancer. This medicine may be used for other purposes; ask your health care provider or pharmacist if you have questions. COMMON BRAND NAME(S): FASLODEX What should I tell my health care provider before I take this medicine? They need to know if you have any of these conditions:  bleeding disorders  liver disease  low blood counts, like low white cell, platelet, or red cell counts  an unusual or allergic reaction to fulvestrant, other medicines, foods, dyes, or preservatives  pregnant or trying to get pregnant  breast-feeding How should I use this medicine? This medicine is for injection into a muscle. It is usually given by a health care professional in a hospital or clinic setting. Talk to your pediatrician regarding the use of this medicine in children. Special care may be needed. Overdosage: If you think you have taken too much of this medicine contact a poison control center or emergency room at once. NOTE: This medicine is only for you. Do not share this medicine with others. What if I miss a dose? It is important not to miss your dose. Call your doctor or health care professional if you are unable to keep an appointment. What may interact with this medicine?  medicines that treat or prevent blood clots like warfarin, enoxaparin, dalteparin, apixaban, dabigatran, and rivaroxaban This list may not describe all possible interactions. Give your health care provider a list of all the medicines, herbs, non-prescription drugs, or dietary supplements you use. Also tell them if you smoke, drink alcohol, or use illegal drugs. Some items may interact with your medicine. What should I watch for while using this medicine? Your condition will be monitored carefully while you are receiving this medicine. You will need important blood work done while you are taking  this medicine. Do not become pregnant while taking this medicine or for at least 1 year after stopping it. Women of child-bearing potential will need to have a negative pregnancy test before starting this medicine. Women should inform their doctor if they wish to become pregnant or think they might be pregnant. There is a potential for serious side effects to an unborn child. Men should inform their doctors if they wish to father a child. This medicine may lower sperm counts. Talk to your health care professional or pharmacist for more information. Do not breast-feed an infant while taking this medicine or for 1 year after the last dose. What side effects may I notice from receiving this medicine? Side effects that you should report to your doctor or health care professional as soon as possible:  allergic reactions like skin rash, itching or hives, swelling of the face, lips, or tongue  feeling faint or lightheaded, falls  pain, tingling, numbness, or weakness in the legs  signs and symptoms of infection like fever or chills; cough; flu-like symptoms; sore throat  vaginal bleeding Side effects that usually do not require medical attention (report to your doctor or health care professional if they continue or are bothersome):  aches, pains  constipation  diarrhea  headache  hot flashes  nausea, vomiting  pain at site where injected  stomach pain This list may not describe all possible side effects. Call your doctor for medical advice about side effects. You may report side effects to FDA at 1-800-FDA-1088. Where should I keep my medicine? This drug is given in a hospital or clinic and will   not be stored at home. NOTE: This sheet is a summary. It may not cover all possible information. If you have questions about this medicine, talk to your doctor, pharmacist, or health care provider.  2021 Elsevier/Gold Standard (2017-09-02 11:34:41)  

## 2020-07-31 NOTE — Telephone Encounter (Signed)
appts made and printed for pt per los   Rafferty Postlewait 

## 2020-07-31 NOTE — Progress Notes (Signed)
Hematology and Oncology Follow Up Visit  Amy White [id number] 20-Dec-1925 87 y.o. 07/31/2020   Principle Diagnosis:  Metastatic breast cancer - bone only metastasis  Past Therapy: Aromasin 25 mg p.o. Daily -- d/c on 06/15/2018 Ribociclib 200 mg po q day (21/14) -- changed on 03/27/2018 -- patient stopped on her own in 04/2018 Ibrance 75 mg q day (14on/14off)-- d/c Xgeva 120 mg sq q 3 months - on hold permanently due to bad teeth.   Current Therapy: Faslodex 500 mg IM q month - re-start on 09/29/2019   Interim History:  Ms. Amy White is here today for follow-up and Faslodex injections. She is doing well but notes persistent fatigue. Hgb is stable at 9.0, MCV 97, WBC count is 3.1 and platelets 120.  She has not noted any blood loss. No bruising or petechiae.  Her CA 27.29 last month was 61.9.  Right breast exam today was negative. Left mastectomy intact.  No adenopathy noted on exam.  No fever, chills, n/v, cough, rash, dizziness, SOB, chest pain, palpitations, abdominal pain or changes in bowel or bladder habits.  She has mild swelling in her ankles that appears to be stable. Pedal pulses are 2+.  She has an abrasion with scab on her left ankle that is dry/intact and appears to be healing nicely.  No falls or syncope to report.  No numbness or tingling noted in her extremities.  She states that her appetite is ok and she is staying well hydrated. Her weight is stable.   ECOG Performance Status: 1 - Symptomatic but completely ambulatory  Medications:  Allergies as of 07/31/2020      Reactions   Morphine And Related Rash   Sulfa Antibiotics Other (See Comments)   Mouth breaks out in sores   Ciprofloxacin Rash, Other (See Comments)   Rash in the mouth   Crestor [rosuvastatin] Other (See Comments)   Unknown reaction   Penicillins Hives, Rash, Other (See Comments)   Has patient had a PCN reaction causing immediate rash, facial/tongue/throat swelling, SOB or  lightheadedness with hypotension: yes Has patient had a PCN reaction causing severe rash involving mucus membranes or skin necrosis: no Has patient had a PCN reaction that required hospitalization: no Has patient had a PCN reaction occurring within the last 10 years: yes If all of the above answers are "NO", then may proceed with Cephalosporin use.   Vancomycin Rash, Other (See Comments)   "Red-man"      Medication List       Accurate as of July 31, 2020  2:29 PM. If you have any questions, ask your nurse or doctor.        acetaminophen 500 MG tablet Commonly known as: TYLENOL Take 500 mg by mouth every 6 (six) hours as needed for fever.   albuterol 108 (90 Base) MCG/ACT inhaler Commonly known as: VENTOLIN HFA Inhale 2 puffs into the lungs every 6 (six) hours as needed for wheezing or shortness of breath.   ALPRAZolam 0.5 MG tablet Commonly known as: XANAX Take 0.25 mg by mouth 2 (two) times daily as needed for anxiety.   bisoprolol 5 MG tablet Commonly known as: ZEBETA Take 2 tablets (10 mg total) by mouth daily. What changed: how much to take   diphenoxylate-atropine 2.5-0.025 MG tablet Commonly known as: LOMOTIL TAKE 1 TABLET BY MOUTH AFTER EACH LOOSE STOOL, MAXIMUM DAILY DOSE IS 8 TABLETS   feeding supplement Liqd Take 237 mLs by mouth 2 (two) times daily between meals.  furosemide 40 MG tablet Commonly known as: LASIX Take 20 mg by mouth daily as needed.   levothyroxine 88 MCG tablet Commonly known as: SYNTHROID Take 88 mcg by mouth daily.   polyethylene glycol 17 g packet Commonly known as: MIRALAX / GLYCOLAX Take 17 g by mouth daily as needed for mild constipation.   potassium chloride 10 MEQ tablet Commonly known as: KLOR-CON Take 2 tablets (20 mEq total) by mouth daily.   UNABLE TO FIND Hair prosthesis   Vitamin D (Ergocalciferol) 1.25 MG (50000 UNIT) Caps capsule Commonly known as: DRISDOL TAKE 1 CAPSULE BY MOUTH 1 TIME A WEEK   warfarin 5  MG tablet Commonly known as: COUMADIN Take as directed by the anticoagulation clinic. If you are unsure how to take this medication, talk to your nurse or doctor. Original instructions: TAKE ONE-HALF TO 1 TABLET BY MOUTH DAILY AS DIRECTED BY COUMADIN CLINIC       Allergies:  Allergies  Allergen Reactions  . Morphine And Related Rash  . Sulfa Antibiotics Other (See Comments)    Mouth breaks out in sores  . Ciprofloxacin Rash and Other (See Comments)    Rash in the mouth  . Crestor [Rosuvastatin] Other (See Comments)    Unknown reaction  . Penicillins Hives, Rash and Other (See Comments)    Has patient had a PCN reaction causing immediate rash, facial/tongue/throat swelling, SOB or lightheadedness with hypotension: yes Has patient had a PCN reaction causing severe rash involving mucus membranes or skin necrosis: no Has patient had a PCN reaction that required hospitalization: no Has patient had a PCN reaction occurring within the last 10 years: yes If all of the above answers are "NO", then may proceed with Cephalosporin use.   . Vancomycin Rash and Other (See Comments)    "Red-man"    Past Medical History, Surgical history, Social history, and Family History were reviewed and updated.  Review of Systems: All other 10 point review of systems is negative.   Physical Exam:  height is 5\' 5"  (1.651 m) and weight is 144 lb (65.3 kg). Her oral temperature is 98.2 F (36.8 C). Her blood pressure is 147/68 (abnormal) and her pulse is 72. Her respiration is 20 and oxygen saturation is 96%.   Wt Readings from Last 3 Encounters:  07/31/20 144 lb (65.3 kg)  07/03/20 142 lb (64.4 kg)  05/28/20 144 lb 12.8 oz (65.7 kg)    Ocular: Sclerae unicteric, pupils equal, round and reactive to light Ear-nose-throat: Oropharynx clear, dentition fair Lymphatic: No cervical, supraclavicular or axillary adenopathy Lungs no rales or rhonchi, good excursion bilaterally Heart regular rate and rhythm,  no murmur appreciated Abd soft, nontender, positive bowel sounds MSK no focal spinal tenderness, no joint edema Neuro: non-focal, well-oriented, appropriate affect Breasts: Right breast exam negative and left mastectomy site intact. No mass, lesion or rash noted.   Lab Results  Component Value Date   WBC 3.1 (L) 07/31/2020   HGB 9.1 (L) 07/31/2020   HCT 28.6 (L) 07/31/2020   MCV 97.3 07/31/2020   PLT 120 (L) 07/31/2020   Lab Results  Component Value Date   FERRITIN 259 07/03/2020   IRON 69 07/03/2020   TIBC 213 (L) 07/03/2020   UIBC 144 07/03/2020   IRONPCTSAT 32 07/03/2020   Lab Results  Component Value Date   RETICCTPCT 2.4 07/03/2020   RBC 2.94 (L) 07/31/2020   No results found for: KPAFRELGTCHN, LAMBDASER, KAPLAMBRATIO No results found for: IGGSERUM, IGA, IGMSERUM No results found  for: Odetta Pink, SPEI   Chemistry      Component Value Date/Time   NA 141 07/31/2020 1351   NA 142 03/22/2018 1638   NA 146 (H) 01/25/2017 1448   NA 142 07/31/2015 1203   K 4.4 07/31/2020 1351   K 4.0 01/25/2017 1448   K 5.1 07/31/2015 1203   CL 107 07/31/2020 1351   CL 108 01/25/2017 1448   CO2 27 07/31/2020 1351   CO2 29 01/25/2017 1448   CO2 23 07/31/2015 1203   BUN 25 (H) 07/31/2020 1351   BUN 13 03/22/2018 1638   BUN 18 01/25/2017 1448   BUN 23.3 07/31/2015 1203   CREATININE 1.51 (H) 07/31/2020 1351   CREATININE 1.2 01/25/2017 1448   CREATININE 1.1 07/31/2015 1203      Component Value Date/Time   CALCIUM 10.2 07/31/2020 1351   CALCIUM 9.3 01/25/2017 1448   CALCIUM 9.3 07/31/2015 1203   ALKPHOS 133 (H) 07/31/2020 1351   ALKPHOS 64 01/25/2017 1448   ALKPHOS 57 07/31/2015 1203   AST 22 07/31/2020 1351   AST 18 07/31/2015 1203   ALT 10 07/31/2020 1351   ALT 20 01/25/2017 1448   ALT 15 07/31/2015 1203   BILITOT 1.6 (H) 07/31/2020 1351   BILITOT 0.96 07/31/2015 1203       Impression and Plan: Ms. Pilant is a  very pleasant 87 yo caucasian female with metastatic breast cancer still confined to the bones.  She received Faslodex today as planned.  Iron studies are pending. We will replace if needed.  Follow-up in 1 month.  She was encouraged to contact our office with any questions or concerns.   Laverna Peace, NP 2/23/20222:29 PM

## 2020-08-01 LAB — CANCER ANTIGEN 27.29: CA 27.29: 54.9 U/mL — ABNORMAL HIGH (ref 0.0–38.6)

## 2020-08-01 LAB — IRON AND TIBC
Iron: 60 ug/dL (ref 41–142)
Saturation Ratios: 24 % (ref 21–57)
TIBC: 244 ug/dL (ref 236–444)
UIBC: 184 ug/dL (ref 120–384)

## 2020-08-01 LAB — ERYTHROPOIETIN: Erythropoietin: 28.5 m[IU]/mL — ABNORMAL HIGH (ref 2.6–18.5)

## 2020-08-01 LAB — FERRITIN: Ferritin: 191 ng/mL (ref 11–307)

## 2020-08-08 ENCOUNTER — Inpatient Hospital Stay (HOSPITAL_COMMUNITY)
Admission: EM | Admit: 2020-08-08 | Discharge: 2020-09-06 | DRG: 682 | Disposition: E | Payer: Medicare Other | Attending: Internal Medicine | Admitting: Internal Medicine

## 2020-08-08 ENCOUNTER — Emergency Department (HOSPITAL_COMMUNITY): Payer: Medicare Other

## 2020-08-08 ENCOUNTER — Other Ambulatory Visit: Payer: Self-pay

## 2020-08-08 ENCOUNTER — Encounter (HOSPITAL_COMMUNITY): Payer: Self-pay | Admitting: *Deleted

## 2020-08-08 DIAGNOSIS — Z881 Allergy status to other antibiotic agents status: Secondary | ICD-10-CM

## 2020-08-08 DIAGNOSIS — Z885 Allergy status to narcotic agent status: Secondary | ICD-10-CM

## 2020-08-08 DIAGNOSIS — G9341 Metabolic encephalopathy: Secondary | ICD-10-CM | POA: Diagnosis present

## 2020-08-08 DIAGNOSIS — C50919 Malignant neoplasm of unspecified site of unspecified female breast: Secondary | ICD-10-CM | POA: Diagnosis present

## 2020-08-08 DIAGNOSIS — M25562 Pain in left knee: Secondary | ICD-10-CM | POA: Diagnosis not present

## 2020-08-08 DIAGNOSIS — I482 Chronic atrial fibrillation, unspecified: Secondary | ICD-10-CM | POA: Diagnosis present

## 2020-08-08 DIAGNOSIS — J3489 Other specified disorders of nose and nasal sinuses: Secondary | ICD-10-CM | POA: Diagnosis not present

## 2020-08-08 DIAGNOSIS — M7989 Other specified soft tissue disorders: Secondary | ICD-10-CM | POA: Diagnosis not present

## 2020-08-08 DIAGNOSIS — N184 Chronic kidney disease, stage 4 (severe): Secondary | ICD-10-CM | POA: Diagnosis present

## 2020-08-08 DIAGNOSIS — J90 Pleural effusion, not elsewhere classified: Secondary | ICD-10-CM | POA: Diagnosis not present

## 2020-08-08 DIAGNOSIS — M25461 Effusion, right knee: Secondary | ICD-10-CM | POA: Diagnosis not present

## 2020-08-08 DIAGNOSIS — Z950 Presence of cardiac pacemaker: Secondary | ICD-10-CM

## 2020-08-08 DIAGNOSIS — N179 Acute kidney failure, unspecified: Principal | ICD-10-CM | POA: Diagnosis present

## 2020-08-08 DIAGNOSIS — Z8249 Family history of ischemic heart disease and other diseases of the circulatory system: Secondary | ICD-10-CM

## 2020-08-08 DIAGNOSIS — S8002XA Contusion of left knee, initial encounter: Secondary | ICD-10-CM | POA: Diagnosis present

## 2020-08-08 DIAGNOSIS — Z9012 Acquired absence of left breast and nipple: Secondary | ICD-10-CM

## 2020-08-08 DIAGNOSIS — R791 Abnormal coagulation profile: Secondary | ICD-10-CM | POA: Diagnosis not present

## 2020-08-08 DIAGNOSIS — E039 Hypothyroidism, unspecified: Secondary | ICD-10-CM | POA: Diagnosis present

## 2020-08-08 DIAGNOSIS — R0902 Hypoxemia: Secondary | ICD-10-CM | POA: Diagnosis not present

## 2020-08-08 DIAGNOSIS — R52 Pain, unspecified: Secondary | ICD-10-CM

## 2020-08-08 DIAGNOSIS — J189 Pneumonia, unspecified organism: Secondary | ICD-10-CM | POA: Diagnosis present

## 2020-08-08 DIAGNOSIS — J690 Pneumonitis due to inhalation of food and vomit: Secondary | ICD-10-CM

## 2020-08-08 DIAGNOSIS — Z888 Allergy status to other drugs, medicaments and biological substances status: Secondary | ICD-10-CM

## 2020-08-08 DIAGNOSIS — S2222XA Fracture of body of sternum, initial encounter for closed fracture: Secondary | ICD-10-CM

## 2020-08-08 DIAGNOSIS — I10 Essential (primary) hypertension: Secondary | ICD-10-CM | POA: Diagnosis present

## 2020-08-08 DIAGNOSIS — Z820 Family history of epilepsy and other diseases of the nervous system: Secondary | ICD-10-CM

## 2020-08-08 DIAGNOSIS — D62 Acute posthemorrhagic anemia: Secondary | ICD-10-CM | POA: Diagnosis present

## 2020-08-08 DIAGNOSIS — Z20822 Contact with and (suspected) exposure to covid-19: Secondary | ICD-10-CM | POA: Diagnosis present

## 2020-08-08 DIAGNOSIS — Z809 Family history of malignant neoplasm, unspecified: Secondary | ICD-10-CM

## 2020-08-08 DIAGNOSIS — Z66 Do not resuscitate: Secondary | ICD-10-CM | POA: Diagnosis not present

## 2020-08-08 DIAGNOSIS — S2220XA Unspecified fracture of sternum, initial encounter for closed fracture: Secondary | ICD-10-CM | POA: Diagnosis not present

## 2020-08-08 DIAGNOSIS — E875 Hyperkalemia: Secondary | ICD-10-CM | POA: Diagnosis present

## 2020-08-08 DIAGNOSIS — I129 Hypertensive chronic kidney disease with stage 1 through stage 4 chronic kidney disease, or unspecified chronic kidney disease: Secondary | ICD-10-CM | POA: Diagnosis present

## 2020-08-08 DIAGNOSIS — D631 Anemia in chronic kidney disease: Secondary | ICD-10-CM | POA: Diagnosis present

## 2020-08-08 DIAGNOSIS — Z880 Allergy status to penicillin: Secondary | ICD-10-CM

## 2020-08-08 DIAGNOSIS — K8020 Calculus of gallbladder without cholecystitis without obstruction: Secondary | ICD-10-CM | POA: Diagnosis not present

## 2020-08-08 DIAGNOSIS — I495 Sick sinus syndrome: Secondary | ICD-10-CM | POA: Diagnosis present

## 2020-08-08 DIAGNOSIS — R0602 Shortness of breath: Secondary | ICD-10-CM | POA: Diagnosis not present

## 2020-08-08 DIAGNOSIS — I480 Paroxysmal atrial fibrillation: Secondary | ICD-10-CM | POA: Diagnosis present

## 2020-08-08 DIAGNOSIS — Z7901 Long term (current) use of anticoagulants: Secondary | ICD-10-CM

## 2020-08-08 DIAGNOSIS — Z79890 Hormone replacement therapy (postmenopausal): Secondary | ICD-10-CM

## 2020-08-08 DIAGNOSIS — F419 Anxiety disorder, unspecified: Secondary | ICD-10-CM | POA: Diagnosis present

## 2020-08-08 DIAGNOSIS — S060X9A Concussion with loss of consciousness of unspecified duration, initial encounter: Secondary | ICD-10-CM | POA: Diagnosis present

## 2020-08-08 DIAGNOSIS — N1832 Chronic kidney disease, stage 3b: Secondary | ICD-10-CM | POA: Diagnosis present

## 2020-08-08 DIAGNOSIS — Z041 Encounter for examination and observation following transport accident: Secondary | ICD-10-CM | POA: Diagnosis not present

## 2020-08-08 DIAGNOSIS — Y92410 Unspecified street and highway as the place of occurrence of the external cause: Secondary | ICD-10-CM

## 2020-08-08 DIAGNOSIS — Z79899 Other long term (current) drug therapy: Secondary | ICD-10-CM

## 2020-08-08 DIAGNOSIS — S80919A Unspecified superficial injury of unspecified knee, initial encounter: Secondary | ICD-10-CM | POA: Diagnosis not present

## 2020-08-08 DIAGNOSIS — J440 Chronic obstructive pulmonary disease with acute lower respiratory infection: Secondary | ICD-10-CM | POA: Diagnosis present

## 2020-08-08 DIAGNOSIS — C7951 Secondary malignant neoplasm of bone: Secondary | ICD-10-CM | POA: Diagnosis present

## 2020-08-08 DIAGNOSIS — E0590 Thyrotoxicosis, unspecified without thyrotoxic crisis or storm: Secondary | ICD-10-CM | POA: Diagnosis present

## 2020-08-08 DIAGNOSIS — K219 Gastro-esophageal reflux disease without esophagitis: Secondary | ICD-10-CM | POA: Diagnosis present

## 2020-08-08 DIAGNOSIS — Z853 Personal history of malignant neoplasm of breast: Secondary | ICD-10-CM

## 2020-08-08 DIAGNOSIS — S060X0A Concussion without loss of consciousness, initial encounter: Secondary | ICD-10-CM | POA: Diagnosis present

## 2020-08-08 DIAGNOSIS — Z515 Encounter for palliative care: Secondary | ICD-10-CM

## 2020-08-08 DIAGNOSIS — K5900 Constipation, unspecified: Secondary | ICD-10-CM

## 2020-08-08 DIAGNOSIS — T45515A Adverse effect of anticoagulants, initial encounter: Secondary | ICD-10-CM | POA: Diagnosis present

## 2020-08-08 LAB — COMPREHENSIVE METABOLIC PANEL
ALT: 14 U/L (ref 0–44)
AST: 23 U/L (ref 15–41)
Albumin: 3.1 g/dL — ABNORMAL LOW (ref 3.5–5.0)
Alkaline Phosphatase: 112 U/L (ref 38–126)
Anion gap: 10 (ref 5–15)
BUN: 20 mg/dL (ref 8–23)
CO2: 23 mmol/L (ref 22–32)
Calcium: 9.8 mg/dL (ref 8.9–10.3)
Chloride: 105 mmol/L (ref 98–111)
Creatinine, Ser: 1.55 mg/dL — ABNORMAL HIGH (ref 0.44–1.00)
GFR, Estimated: 31 mL/min — ABNORMAL LOW (ref >60)
Glucose, Bld: 117 mg/dL — ABNORMAL HIGH (ref 70–99)
Potassium: 3.8 mmol/L (ref 3.5–5.1)
Sodium: 138 mmol/L (ref 135–145)
Total Bilirubin: 2.6 mg/dL — ABNORMAL HIGH (ref 0.3–1.2)
Total Protein: 7.1 g/dL (ref 6.5–8.1)

## 2020-08-08 LAB — RESP PANEL BY RT-PCR (FLU A&B, COVID) ARPGX2
Influenza A by PCR: NEGATIVE
Influenza B by PCR: NEGATIVE
SARS Coronavirus 2 by RT PCR: NEGATIVE

## 2020-08-08 LAB — CBC WITH DIFFERENTIAL/PLATELET
Abs Immature Granulocytes: 0.04 10*3/uL (ref 0.00–0.07)
Basophils Absolute: 0.1 10*3/uL (ref 0.0–0.1)
Basophils Relative: 1 %
Eosinophils Absolute: 0.2 10*3/uL (ref 0.0–0.5)
Eosinophils Relative: 5 %
HCT: 30.2 % — ABNORMAL LOW (ref 36.0–46.0)
Hemoglobin: 10.1 g/dL — ABNORMAL LOW (ref 12.0–15.0)
Immature Granulocytes: 1 %
Lymphocytes Relative: 13 %
Lymphs Abs: 0.5 10*3/uL — ABNORMAL LOW (ref 0.7–4.0)
MCH: 31.9 pg (ref 26.0–34.0)
MCHC: 33.4 g/dL (ref 30.0–36.0)
MCV: 95.3 fL (ref 80.0–100.0)
Monocytes Absolute: 0.3 10*3/uL (ref 0.1–1.0)
Monocytes Relative: 8 %
Neutro Abs: 3 10*3/uL (ref 1.7–7.7)
Neutrophils Relative %: 72 %
Platelets: 121 10*3/uL — ABNORMAL LOW (ref 150–400)
RBC: 3.17 MIL/uL — ABNORMAL LOW (ref 3.87–5.11)
RDW: 15.5 % (ref 11.5–15.5)
WBC: 4.1 10*3/uL (ref 4.0–10.5)
nRBC: 0 % (ref 0.0–0.2)

## 2020-08-08 LAB — I-STAT CHEM 8, ED
BUN: 20 mg/dL (ref 8–23)
Calcium, Ion: 1.27 mmol/L (ref 1.15–1.40)
Chloride: 104 mmol/L (ref 98–111)
Creatinine, Ser: 1.4 mg/dL — ABNORMAL HIGH (ref 0.44–1.00)
Glucose, Bld: 113 mg/dL — ABNORMAL HIGH (ref 70–99)
HCT: 30 % — ABNORMAL LOW (ref 36.0–46.0)
Hemoglobin: 10.2 g/dL — ABNORMAL LOW (ref 12.0–15.0)
Potassium: 3.9 mmol/L (ref 3.5–5.1)
Sodium: 141 mmol/L (ref 135–145)
TCO2: 27 mmol/L (ref 22–32)

## 2020-08-08 LAB — PROTIME-INR
INR: 1.9 — ABNORMAL HIGH (ref 0.8–1.2)
Prothrombin Time: 20.7 seconds — ABNORMAL HIGH (ref 11.4–15.2)

## 2020-08-08 LAB — TYPE AND SCREEN
ABO/RH(D): O NEG
Antibody Screen: NEGATIVE

## 2020-08-08 MED ORDER — FENTANYL CITRATE (PF) 100 MCG/2ML IJ SOLN
12.5000 ug | Freq: Once | INTRAMUSCULAR | Status: AC
  Administered 2020-08-08: 12.5 ug via INTRAVENOUS
  Filled 2020-08-08: qty 2

## 2020-08-08 MED ORDER — ONDANSETRON HCL 4 MG/2ML IJ SOLN
4.0000 mg | Freq: Once | INTRAMUSCULAR | Status: AC
Start: 2020-08-08 — End: 2020-08-08
  Administered 2020-08-08: 4 mg via INTRAVENOUS
  Filled 2020-08-08: qty 2

## 2020-08-08 MED ORDER — IOHEXOL 300 MG/ML  SOLN
75.0000 mL | Freq: Once | INTRAMUSCULAR | Status: AC | PRN
  Administered 2020-08-08: 75 mL via INTRAVENOUS

## 2020-08-08 MED ORDER — FENTANYL CITRATE (PF) 100 MCG/2ML IJ SOLN
50.0000 ug | Freq: Once | INTRAMUSCULAR | Status: AC
  Administered 2020-08-08: 50 ug via INTRAVENOUS
  Filled 2020-08-08: qty 2
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
## 2020-08-08 NOTE — ED Notes (Signed)
[place] pt would like a update
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
## 2020-08-09 ENCOUNTER — Encounter (HOSPITAL_COMMUNITY): Payer: Self-pay | Admitting: Internal Medicine

## 2020-08-09 DIAGNOSIS — C50919 Malignant neoplasm of unspecified site of unspecified female breast: Secondary | ICD-10-CM | POA: Diagnosis not present

## 2020-08-09 DIAGNOSIS — N1832 Chronic kidney disease, stage 3b: Secondary | ICD-10-CM | POA: Diagnosis not present

## 2020-08-09 DIAGNOSIS — S2222XA Fracture of body of sternum, initial encounter for closed fracture: Secondary | ICD-10-CM

## 2020-08-09 DIAGNOSIS — S2220XA Unspecified fracture of sternum, initial encounter for closed fracture: Secondary | ICD-10-CM

## 2020-08-09 DIAGNOSIS — C7951 Secondary malignant neoplasm of bone: Secondary | ICD-10-CM

## 2020-08-09 DIAGNOSIS — I482 Chronic atrial fibrillation, unspecified: Secondary | ICD-10-CM

## 2020-08-09 DIAGNOSIS — K219 Gastro-esophageal reflux disease without esophagitis: Secondary | ICD-10-CM

## 2020-08-09 DIAGNOSIS — I10 Essential (primary) hypertension: Secondary | ICD-10-CM

## 2020-08-09 DIAGNOSIS — E039 Hypothyroidism, unspecified: Secondary | ICD-10-CM

## 2020-08-09 DIAGNOSIS — J189 Pneumonia, unspecified organism: Secondary | ICD-10-CM

## 2020-08-09 LAB — CBC WITH DIFFERENTIAL/PLATELET
Abs Immature Granulocytes: 0.06 10*3/uL (ref 0.00–0.07)
Basophils Absolute: 0 10*3/uL (ref 0.0–0.1)
Basophils Relative: 1 %
Eosinophils Absolute: 0 10*3/uL (ref 0.0–0.5)
Eosinophils Relative: 0 %
HCT: 29 % — ABNORMAL LOW (ref 36.0–46.0)
Hemoglobin: 9.4 g/dL — ABNORMAL LOW (ref 12.0–15.0)
Immature Granulocytes: 1 %
Lymphocytes Relative: 5 %
Lymphs Abs: 0.3 10*3/uL — ABNORMAL LOW (ref 0.7–4.0)
MCH: 31.3 pg (ref 26.0–34.0)
MCHC: 32.4 g/dL (ref 30.0–36.0)
MCV: 96.7 fL (ref 80.0–100.0)
Monocytes Absolute: 0.5 10*3/uL (ref 0.1–1.0)
Monocytes Relative: 9 %
Neutro Abs: 4.9 10*3/uL (ref 1.7–7.7)
Neutrophils Relative %: 84 %
Platelets: 127 10*3/uL — ABNORMAL LOW (ref 150–400)
RBC: 3 MIL/uL — ABNORMAL LOW (ref 3.87–5.11)
RDW: 15.6 % — ABNORMAL HIGH (ref 11.5–15.5)
WBC: 5.7 10*3/uL (ref 4.0–10.5)
nRBC: 0 % (ref 0.0–0.2)

## 2020-08-09 LAB — COMPREHENSIVE METABOLIC PANEL
ALT: 13 U/L (ref 0–44)
AST: 24 U/L (ref 15–41)
Albumin: 3 g/dL — ABNORMAL LOW (ref 3.5–5.0)
Alkaline Phosphatase: 105 U/L (ref 38–126)
Anion gap: 7 (ref 5–15)
BUN: 20 mg/dL (ref 8–23)
CO2: 27 mmol/L (ref 22–32)
Calcium: 9.7 mg/dL (ref 8.9–10.3)
Chloride: 105 mmol/L (ref 98–111)
Creatinine, Ser: 1.53 mg/dL — ABNORMAL HIGH (ref 0.44–1.00)
GFR, Estimated: 32 mL/min — ABNORMAL LOW (ref >60)
Glucose, Bld: 130 mg/dL — ABNORMAL HIGH (ref 70–99)
Potassium: 4.8 mmol/L (ref 3.5–5.1)
Sodium: 139 mmol/L (ref 135–145)
Total Bilirubin: 2.4 mg/dL — ABNORMAL HIGH (ref 0.3–1.2)
Total Protein: 7 g/dL (ref 6.5–8.1)

## 2020-08-09 LAB — HIV ANTIBODY (ROUTINE TESTING W REFLEX): HIV Screen 4th Generation wRfx: NONREACTIVE

## 2020-08-09 LAB — MAGNESIUM: Magnesium: 2 mg/dL (ref 1.7–2.4)

## 2020-08-09 LAB — TROPONIN I (HIGH SENSITIVITY): Troponin I (High Sensitivity): 34 ng/L — ABNORMAL HIGH (ref <18)

## 2020-08-09 LAB — PROTIME-INR
INR: 2 — ABNORMAL HIGH (ref 0.8–1.2)
Prothrombin Time: 22.2 seconds — ABNORMAL HIGH (ref 11.4–15.2)

## 2020-08-09 MED ORDER — ACETAMINOPHEN 325 MG PO TABS
650.0000 mg | ORAL_TABLET | Freq: Four times a day (QID) | ORAL | Status: DC
  Administered 2020-08-10 – 2020-08-14 (×7): 650 mg via ORAL
  Filled 2020-08-09 (×15): qty 2

## 2020-08-09 MED ORDER — ACETAMINOPHEN 325 MG PO TABS: 650.0000 mg | ORAL_TABLET | Freq: Four times a day (QID) | ORAL | Status: DC | PRN

## 2020-08-09 MED ORDER — ACETAMINOPHEN 325 MG PO TABS
650.0000 mg | ORAL_TABLET | Freq: Two times a day (BID) | ORAL | Status: DC
  Filled 2020-08-09: qty 2

## 2020-08-09 MED ORDER — FENTANYL CITRATE (PF) 100 MCG/2ML IJ SOLN
12.5000 ug | INTRAMUSCULAR | Status: DC | PRN
Start: 2020-08-09 — End: 2020-08-13

## 2020-08-09 MED ORDER — LEVOTHYROXINE SODIUM 88 MCG PO TABS
88.0000 ug | ORAL_TABLET | Freq: Every day | ORAL | Status: DC
  Administered 2020-08-10 – 2020-08-15 (×5): 88 ug via ORAL
  Filled 2020-08-09 (×6): qty 1

## 2020-08-09 MED ORDER — SODIUM CHLORIDE 0.9 % IV SOLN
500.0000 mg | Freq: Every day | INTRAVENOUS | Status: DC
  Administered 2020-08-09: 500 mg via INTRAVENOUS
  Filled 2020-08-09 (×2): qty 500

## 2020-08-09 MED ORDER — POLYETHYLENE GLYCOL 3350 17 G PO PACK: 17.0000 g | PACK | Freq: Every day | ORAL | Status: DC | PRN

## 2020-08-09 MED ORDER — SODIUM CHLORIDE 0.9 % IV SOLN
2.0000 g | Freq: Every day | INTRAVENOUS | Status: DC
  Administered 2020-08-09 – 2020-08-11 (×3): 2 g via INTRAVENOUS
  Filled 2020-08-09 (×3): qty 20

## 2020-08-09 MED ORDER — WARFARIN - PHARMACIST DOSING INPATIENT: Freq: Every day | Status: DC

## 2020-08-09 MED ORDER — HYDRALAZINE HCL 20 MG/ML IJ SOLN: 10.0000 mg | Freq: Four times a day (QID) | INTRAMUSCULAR | Status: DC | PRN

## 2020-08-09 MED ORDER — ONDANSETRON HCL 4 MG PO TABS: 4.0000 mg | ORAL_TABLET | Freq: Four times a day (QID) | ORAL | Status: DC | PRN

## 2020-08-09 MED ORDER — WARFARIN SODIUM 2.5 MG PO TABS
2.5000 mg | ORAL_TABLET | Freq: Once | ORAL | Status: DC
  Filled 2020-08-09: qty 1

## 2020-08-09 MED ORDER — ACETAMINOPHEN 650 MG RE SUPP: 650.0000 mg | Freq: Four times a day (QID) | RECTAL | Status: DC | PRN

## 2020-08-09 MED ORDER — LACTATED RINGERS IV SOLN: INTRAVENOUS | Status: AC

## 2020-08-09 MED ORDER — BISOPROLOL FUMARATE 5 MG PO TABS
5.0000 mg | ORAL_TABLET | Freq: Every day | ORAL | Status: DC
  Administered 2020-08-10 – 2020-08-15 (×5): 5 mg via ORAL
  Filled 2020-08-09 (×7): qty 1

## 2020-08-09 MED ORDER — ALPRAZOLAM 0.25 MG PO TABS: 0.2500 mg | ORAL_TABLET | Freq: Two times a day (BID) | ORAL | Status: DC | PRN

## 2020-08-09 MED ORDER — FENTANYL CITRATE (PF) 100 MCG/2ML IJ SOLN: 25.0000 ug | INTRAMUSCULAR | Status: DC | PRN

## 2020-08-09 MED ORDER — ALBUTEROL SULFATE HFA 108 (90 BASE) MCG/ACT IN AERS: 2.0000 | INHALATION_SPRAY | Freq: Four times a day (QID) | RESPIRATORY_TRACT | Status: DC | PRN

## 2020-08-09 MED ORDER — ONDANSETRON HCL 4 MG/2ML IJ SOLN: 4.0000 mg | Freq: Four times a day (QID) | INTRAMUSCULAR | Status: DC | PRN
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
## 2020-08-09 NOTE — Progress Notes (Signed)
   08/09/20 0530  Assess: MEWS Score  Level of Consciousness Responds to Voice  Assess: MEWS Score  MEWS Temp 0  MEWS Systolic 0  MEWS Pulse 0  MEWS RR 1  MEWS LOC 1  MEWS Score 2  MEWS Score Color Yellow  Assess: if the MEWS score is Yellow or Red  Were vital signs taken at a resting state? Yes  Focused Assessment No change from prior assessment  Early Detection of Sepsis Score *See Row Information* Low  MEWS guidelines implemented *See Row Information* No, previously yellow, continue vital signs every 4 hours  Treat  MEWS Interventions Other (Comment)  Pain Score Asleep  Faces Pain Scale 0 (asleep)  Pain Intervention(s) Repositioned  Complains of Other (Comment) (no complaints)  Interventions Reposition  Take Vital Signs  Increase Vital Sign Frequency  Yellow: Q 2hr X 2 then Q 4hr X 2, if remains yellow, continue Q 4hrs  Escalate  MEWS: Escalate Yellow: discuss with charge nurse/RN and consider discussing with provider and RRT  Notify: Charge Nurse/RN  Name of Charge Nurse/RN Notified Blanch Media, RN  Date Charge Nurse/RN Notified 08/09/20  Time Charge Nurse/RN Notified 0601  Notify: Provider  Provider Name/Title B. Randol Kern Shea Evans)  Date Provider Notified 08/09/20  Time Provider Notified [phone]  Notification Type Page  Notification Reason Other (Comment) (LOC and RR)  Provider response See new orders (Fentanyl was DC'd)  Date of Provider Response 08/09/20  Time of Provider Response [phone]  Notify: Rapid Response  Name of Rapid Response RN Notified Not notified at this time
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
## 2020-08-09 NOTE — Progress Notes (Signed)
Patient's grand-daughter Ritta Slot [phone] called to check on patient.  She is requesting someone to contact her so that patient can get Home Health when she is discharged.  [place] daughter have questions regarding this process.  She also inquired about her grand mother's pocket book that is present in the room.  However staff does not know the contents within the pocket book.  Staff advised grand-daughter to discuss with patient and pick bag up when she comes to visit tomorrow.

## 2020-08-09 NOTE — Progress Notes (Signed)
ASSUMPTION OF CARE NOTE   08/09/2020 11:04 AM  Amy White was seen and examined.  The H&P by the admitting provider, orders, imaging was reviewed.  Please see new orders.  Will continue to follow.   Vitals:   08/09/20 0923 08/09/20 1059  BP: (!) 95/55 128/67  Pulse: 86 75  Resp: 14   Temp: 98.8 F (37.1 C)   SpO2:  94%    Results for orders placed or performed during the hospital encounter of 08/22/2020  Resp Panel by RT-PCR (Flu A&B, Covid) Nasopharyngeal Swab   Specimen: Nasopharyngeal Swab; Nasopharyngeal(NP) swabs in vial transport medium  Result Value Ref Range   SARS Coronavirus 2 by RT PCR NEGATIVE NEGATIVE   Influenza A by PCR NEGATIVE NEGATIVE   Influenza B by PCR NEGATIVE NEGATIVE  CBC with Differential  Result Value Ref Range   WBC 4.1 4.0 - 10.5 K/uL   RBC 3.17 (L) 3.87 - 5.11 MIL/uL   Hemoglobin 10.1 (L) 12.0 - 15.0 g/dL   HCT 30.2 (L) 36.0 - 46.0 %   MCV 95.3 80.0 - 100.0 fL   MCH 31.9 26.0 - 34.0 pg   MCHC 33.4 30.0 - 36.0 g/dL   RDW 15.5 11.5 - 15.5 %   Platelets 121 (L) 150 - 400 K/uL   nRBC 0.0 0.0 - 0.2 %   Neutrophils Relative % 72 %   Neutro Abs 3.0 1.7 - 7.7 K/uL   Lymphocytes Relative 13 %   Lymphs Abs 0.5 (L) 0.7 - 4.0 K/uL   Monocytes Relative 8 %   Monocytes Absolute 0.3 0.1 - 1.0 K/uL   Eosinophils Relative 5 %   Eosinophils Absolute 0.2 0.0 - 0.5 K/uL   Basophils Relative 1 %   Basophils Absolute 0.1 0.0 - 0.1 K/uL   Immature Granulocytes 1 %   Abs Immature Granulocytes 0.04 0.00 - 0.07 K/uL  Protime-INR  Result Value Ref Range   Prothrombin Time 20.7 (H) 11.4 - 15.2 seconds   INR 1.9 (H) 0.8 - 1.2  Comprehensive metabolic panel  Result Value Ref Range   Sodium 138 135 - 145 mmol/L   Potassium 3.8 3.5 - 5.1 mmol/L   Chloride 105 98 - 111 mmol/L   CO2 23 22 - 32 mmol/L   Glucose, Bld 117 (H) 70 - 99 mg/dL   BUN 20 8 - 23 mg/dL   Creatinine, Ser 1.55 (H) 0.44 - 1.00 mg/dL   Calcium 9.8 8.9 - 10.3 mg/dL   Total Protein 7.1 6.5 -  8.1 g/dL   Albumin 3.1 (L) 3.5 - 5.0 g/dL   AST 23 15 - 41 U/L   ALT 14 0 - 44 U/L   Alkaline Phosphatase 112 38 - 126 U/L   Total Bilirubin 2.6 (H) 0.3 - 1.2 mg/dL   GFR, Estimated 31 (L) >60 mL/min   Anion gap 10 5 - 15  Comprehensive metabolic panel  Result Value Ref Range   Sodium 139 135 - 145 mmol/L   Potassium 4.8 3.5 - 5.1 mmol/L   Chloride 105 98 - 111 mmol/L   CO2 27 22 - 32 mmol/L   Glucose, Bld 130 (H) 70 - 99 mg/dL   BUN 20 8 - 23 mg/dL   Creatinine, Ser 1.53 (H) 0.44 - 1.00 mg/dL   Calcium 9.7 8.9 - 10.3 mg/dL   Total Protein 7.0 6.5 - 8.1 g/dL   Albumin 3.0 (L) 3.5 - 5.0 g/dL   AST 24 15 - 41 U/L  ALT 13 0 - 44 U/L   Alkaline Phosphatase 105 38 - 126 U/L   Total Bilirubin 2.4 (H) 0.3 - 1.2 mg/dL   GFR, Estimated 32 (L) >60 mL/min   Anion gap 7 5 - 15  Magnesium  Result Value Ref Range   Magnesium 2.0 1.7 - 2.4 mg/dL  CBC WITH DIFFERENTIAL  Result Value Ref Range   WBC 5.7 4.0 - 10.5 K/uL   RBC 3.00 (L) 3.87 - 5.11 MIL/uL   Hemoglobin 9.4 (L) 12.0 - 15.0 g/dL   HCT 29.0 (L) 36.0 - 46.0 %   MCV 96.7 80.0 - 100.0 fL   MCH 31.3 26.0 - 34.0 pg   MCHC 32.4 30.0 - 36.0 g/dL   RDW 15.6 (H) 11.5 - 15.5 %   Platelets 127 (L) 150 - 400 K/uL   nRBC 0.0 0.0 - 0.2 %   Neutrophils Relative % 84 %   Neutro Abs 4.9 1.7 - 7.7 K/uL   Lymphocytes Relative 5 %   Lymphs Abs 0.3 (L) 0.7 - 4.0 K/uL   Monocytes Relative 9 %   Monocytes Absolute 0.5 0.1 - 1.0 K/uL   Eosinophils Relative 0 %   Eosinophils Absolute 0.0 0.0 - 0.5 K/uL   Basophils Relative 1 %   Basophils Absolute 0.0 0.0 - 0.1 K/uL   Immature Granulocytes 1 %   Abs Immature Granulocytes 0.06 0.00 - 0.07 K/uL  HIV Antibody (routine testing w rflx)  Result Value Ref Range   HIV Screen 4th Generation wRfx Non Reactive Non Reactive  Protime-INR  Result Value Ref Range   Prothrombin Time 22.2 (H) 11.4 - 15.2 seconds   INR 2.0 (H) 0.8 - 1.2  I-stat chem 8, ED (not at [facility] or [facility])  Result Value Ref Range    Sodium 141 135 - 145 mmol/L   Potassium 3.9 3.5 - 5.1 mmol/L   Chloride 104 98 - 111 mmol/L   BUN 20 8 - 23 mg/dL   Creatinine, Ser 1.40 (H) 0.44 - 1.00 mg/dL   Glucose, Bld 113 (H) 70 - 99 mg/dL   Calcium, Ion 1.27 1.15 - 1.40 mmol/L   TCO2 27 22 - 32 mmol/L   Hemoglobin 10.2 (L) 12.0 - 15.0 g/dL   HCT 30.0 (L) 36.0 - 46.0 %  Type and screen [place]  Result Value Ref Range   ABO/RH(D) O NEG    Antibody Screen NEG    Sample Expiration      08/11/2020,2359 Performed at [facility], [address] [place], [place] [address]   Troponin I (High Sensitivity)  Result Value Ref Range   Troponin I (High Sensitivity) 34 (H) <18 ng/L   C. Wynetta Emery, MD [facility]   09/04/2020  7:28 PM How to contact the [facility] Attending or Consulting provider [place] or covering provider during after hours [place], for this patient?  1. Check the care team in [facility] and look for a) attending/consulting [facility] provider listed and b) the [facility] team listed 2. Log into [email or web address] and use [place]'s universal password to access. If you do not have the password, please contact the hospital operator. 3. Locate the [facility] provider you are looking for under [facility] and page to a number that you can be directly reached. 4. If you still have difficulty reaching the provider, please page the [facility] (Director on Call) for the Hospitalists listed on amion for assistance.
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
## 2020-08-10 DIAGNOSIS — D62 Acute posthemorrhagic anemia: Secondary | ICD-10-CM | POA: Diagnosis present

## 2020-08-10 DIAGNOSIS — J90 Pleural effusion, not elsewhere classified: Secondary | ICD-10-CM | POA: Diagnosis not present

## 2020-08-10 DIAGNOSIS — C50919 Malignant neoplasm of unspecified site of unspecified female breast: Secondary | ICD-10-CM | POA: Diagnosis not present

## 2020-08-10 DIAGNOSIS — Z950 Presence of cardiac pacemaker: Secondary | ICD-10-CM | POA: Diagnosis not present

## 2020-08-10 DIAGNOSIS — K219 Gastro-esophageal reflux disease without esophagitis: Secondary | ICD-10-CM | POA: Diagnosis present

## 2020-08-10 DIAGNOSIS — Z7189 Other specified counseling: Secondary | ICD-10-CM | POA: Diagnosis not present

## 2020-08-10 DIAGNOSIS — R5383 Other fatigue: Secondary | ICD-10-CM | POA: Diagnosis not present

## 2020-08-10 DIAGNOSIS — J189 Pneumonia, unspecified organism: Secondary | ICD-10-CM | POA: Diagnosis present

## 2020-08-10 DIAGNOSIS — E039 Hypothyroidism, unspecified: Secondary | ICD-10-CM | POA: Diagnosis present

## 2020-08-10 DIAGNOSIS — I495 Sick sinus syndrome: Secondary | ICD-10-CM | POA: Diagnosis present

## 2020-08-10 DIAGNOSIS — I480 Paroxysmal atrial fibrillation: Secondary | ICD-10-CM | POA: Diagnosis present

## 2020-08-10 DIAGNOSIS — N184 Chronic kidney disease, stage 4 (severe): Secondary | ICD-10-CM | POA: Diagnosis present

## 2020-08-10 DIAGNOSIS — N179 Acute kidney failure, unspecified: Secondary | ICD-10-CM | POA: Diagnosis not present

## 2020-08-10 DIAGNOSIS — S2220XD Unspecified fracture of sternum, subsequent encounter for fracture with routine healing: Secondary | ICD-10-CM | POA: Diagnosis not present

## 2020-08-10 DIAGNOSIS — E875 Hyperkalemia: Secondary | ICD-10-CM | POA: Diagnosis present

## 2020-08-10 DIAGNOSIS — Z66 Do not resuscitate: Secondary | ICD-10-CM | POA: Diagnosis not present

## 2020-08-10 DIAGNOSIS — R4182 Altered mental status, unspecified: Secondary | ICD-10-CM | POA: Diagnosis not present

## 2020-08-10 DIAGNOSIS — I482 Chronic atrial fibrillation, unspecified: Secondary | ICD-10-CM | POA: Diagnosis not present

## 2020-08-10 DIAGNOSIS — M25562 Pain in left knee: Secondary | ICD-10-CM | POA: Diagnosis present

## 2020-08-10 DIAGNOSIS — R638 Other symptoms and signs concerning food and fluid intake: Secondary | ICD-10-CM | POA: Diagnosis not present

## 2020-08-10 DIAGNOSIS — E0590 Thyrotoxicosis, unspecified without thyrotoxic crisis or storm: Secondary | ICD-10-CM | POA: Diagnosis present

## 2020-08-10 DIAGNOSIS — Z20822 Contact with and (suspected) exposure to covid-19: Secondary | ICD-10-CM | POA: Diagnosis present

## 2020-08-10 DIAGNOSIS — C7951 Secondary malignant neoplasm of bone: Secondary | ICD-10-CM | POA: Diagnosis present

## 2020-08-10 DIAGNOSIS — Z853 Personal history of malignant neoplasm of breast: Secondary | ICD-10-CM | POA: Diagnosis not present

## 2020-08-10 DIAGNOSIS — G9341 Metabolic encephalopathy: Secondary | ICD-10-CM | POA: Diagnosis present

## 2020-08-10 DIAGNOSIS — Z515 Encounter for palliative care: Secondary | ICD-10-CM | POA: Diagnosis not present

## 2020-08-10 DIAGNOSIS — K5900 Constipation, unspecified: Secondary | ICD-10-CM | POA: Diagnosis not present

## 2020-08-10 DIAGNOSIS — S2222XA Fracture of body of sternum, initial encounter for closed fracture: Secondary | ICD-10-CM | POA: Diagnosis present

## 2020-08-10 DIAGNOSIS — F419 Anxiety disorder, unspecified: Secondary | ICD-10-CM | POA: Diagnosis present

## 2020-08-10 DIAGNOSIS — Z7901 Long term (current) use of anticoagulants: Secondary | ICD-10-CM | POA: Diagnosis not present

## 2020-08-10 DIAGNOSIS — J440 Chronic obstructive pulmonary disease with acute lower respiratory infection: Secondary | ICD-10-CM | POA: Diagnosis present

## 2020-08-10 DIAGNOSIS — Y92410 Unspecified street and highway as the place of occurrence of the external cause: Secondary | ICD-10-CM | POA: Diagnosis not present

## 2020-08-10 DIAGNOSIS — I129 Hypertensive chronic kidney disease with stage 1 through stage 4 chronic kidney disease, or unspecified chronic kidney disease: Secondary | ICD-10-CM | POA: Diagnosis present

## 2020-08-10 LAB — CBC WITH DIFFERENTIAL/PLATELET
Abs Immature Granulocytes: 0.05 10*3/uL (ref 0.00–0.07)
Basophils Absolute: 0 10*3/uL (ref 0.0–0.1)
Basophils Relative: 0 %
Eosinophils Absolute: 0 10*3/uL (ref 0.0–0.5)
Eosinophils Relative: 0 %
HCT: 31.9 % — ABNORMAL LOW (ref 36.0–46.0)
Hemoglobin: 9.8 g/dL — ABNORMAL LOW (ref 12.0–15.0)
Immature Granulocytes: 1 %
Lymphocytes Relative: 4 %
Lymphs Abs: 0.4 10*3/uL — ABNORMAL LOW (ref 0.7–4.0)
MCH: 30.4 pg (ref 26.0–34.0)
MCHC: 30.7 g/dL (ref 30.0–36.0)
MCV: 99.1 fL (ref 80.0–100.0)
Monocytes Absolute: 1 10*3/uL (ref 0.1–1.0)
Monocytes Relative: 12 %
Neutro Abs: 7.1 10*3/uL (ref 1.7–7.7)
Neutrophils Relative %: 83 %
Platelets: 125 10*3/uL — ABNORMAL LOW (ref 150–400)
RBC: 3.22 MIL/uL — ABNORMAL LOW (ref 3.87–5.11)
RDW: 15.8 % — ABNORMAL HIGH (ref 11.5–15.5)
WBC: 8.5 10*3/uL (ref 4.0–10.5)
nRBC: 0 % (ref 0.0–0.2)

## 2020-08-10 LAB — COMPREHENSIVE METABOLIC PANEL
ALT: 14 U/L (ref 0–44)
AST: 23 U/L (ref 15–41)
Albumin: 3 g/dL — ABNORMAL LOW (ref 3.5–5.0)
Alkaline Phosphatase: 95 U/L (ref 38–126)
Anion gap: 9 (ref 5–15)
BUN: 25 mg/dL — ABNORMAL HIGH (ref 8–23)
CO2: 26 mmol/L (ref 22–32)
Calcium: 9.6 mg/dL (ref 8.9–10.3)
Chloride: 105 mmol/L (ref 98–111)
Creatinine, Ser: 1.71 mg/dL — ABNORMAL HIGH (ref 0.44–1.00)
GFR, Estimated: 28 mL/min — ABNORMAL LOW (ref >60)
Glucose, Bld: 100 mg/dL — ABNORMAL HIGH (ref 70–99)
Potassium: 4.9 mmol/L (ref 3.5–5.1)
Sodium: 140 mmol/L (ref 135–145)
Total Bilirubin: 1.8 mg/dL — ABNORMAL HIGH (ref 0.3–1.2)
Total Protein: 7.1 g/dL (ref 6.5–8.1)

## 2020-08-10 LAB — PROTIME-INR
INR: 2.7 — ABNORMAL HIGH (ref 0.8–1.2)
Prothrombin Time: 27.6 seconds — ABNORMAL HIGH (ref 11.4–15.2)

## 2020-08-10 MED ORDER — WARFARIN SODIUM 1 MG PO TABS
1.0000 mg | ORAL_TABLET | Freq: Once | ORAL | Status: AC
  Administered 2020-08-10: 1 mg via ORAL
  Filled 2020-08-10: qty 1

## 2020-08-10 MED ORDER — SODIUM CHLORIDE 0.9 % IV SOLN: INTRAVENOUS | Status: DC
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
## 2020-08-11 DIAGNOSIS — C50919 Malignant neoplasm of unspecified site of unspecified female breast: Secondary | ICD-10-CM | POA: Diagnosis not present

## 2020-08-11 DIAGNOSIS — N179 Acute kidney failure, unspecified: Secondary | ICD-10-CM | POA: Diagnosis not present

## 2020-08-11 DIAGNOSIS — S2220XD Unspecified fracture of sternum, subsequent encounter for fracture with routine healing: Secondary | ICD-10-CM | POA: Diagnosis not present

## 2020-08-11 DIAGNOSIS — I482 Chronic atrial fibrillation, unspecified: Secondary | ICD-10-CM | POA: Diagnosis not present

## 2020-08-11 LAB — CBC WITH DIFFERENTIAL/PLATELET
Abs Immature Granulocytes: 0.09 10*3/uL — ABNORMAL HIGH (ref 0.00–0.07)
Basophils Absolute: 0 10*3/uL (ref 0.0–0.1)
Basophils Relative: 1 %
Eosinophils Absolute: 0.1 10*3/uL (ref 0.0–0.5)
Eosinophils Relative: 2 %
HCT: 30.6 % — ABNORMAL LOW (ref 36.0–46.0)
Hemoglobin: 9.5 g/dL — ABNORMAL LOW (ref 12.0–15.0)
Immature Granulocytes: 1 %
Lymphocytes Relative: 8 %
Lymphs Abs: 0.5 10*3/uL — ABNORMAL LOW (ref 0.7–4.0)
MCH: 30.7 pg (ref 26.0–34.0)
MCHC: 31 g/dL (ref 30.0–36.0)
MCV: 99 fL (ref 80.0–100.0)
Monocytes Absolute: 0.6 10*3/uL (ref 0.1–1.0)
Monocytes Relative: 10 %
Neutro Abs: 5 10*3/uL (ref 1.7–7.7)
Neutrophils Relative %: 78 %
Platelets: 118 10*3/uL — ABNORMAL LOW (ref 150–400)
RBC: 3.09 MIL/uL — ABNORMAL LOW (ref 3.87–5.11)
RDW: 15.7 % — ABNORMAL HIGH (ref 11.5–15.5)
WBC: 6.4 10*3/uL (ref 4.0–10.5)
nRBC: 0 % (ref 0.0–0.2)

## 2020-08-11 LAB — COMPREHENSIVE METABOLIC PANEL
ALT: 14 U/L (ref 0–44)
AST: 25 U/L (ref 15–41)
Albumin: 2.8 g/dL — ABNORMAL LOW (ref 3.5–5.0)
Alkaline Phosphatase: 85 U/L (ref 38–126)
Anion gap: 11 (ref 5–15)
BUN: 39 mg/dL — ABNORMAL HIGH (ref 8–23)
CO2: 20 mmol/L — ABNORMAL LOW (ref 22–32)
Calcium: 9.2 mg/dL (ref 8.9–10.3)
Chloride: 107 mmol/L (ref 98–111)
Creatinine, Ser: 2.27 mg/dL — ABNORMAL HIGH (ref 0.44–1.00)
GFR, Estimated: 20 mL/min — ABNORMAL LOW (ref >60)
Glucose, Bld: 99 mg/dL (ref 70–99)
Potassium: 5.1 mmol/L (ref 3.5–5.1)
Sodium: 138 mmol/L (ref 135–145)
Total Bilirubin: 1.6 mg/dL — ABNORMAL HIGH (ref 0.3–1.2)
Total Protein: 6.7 g/dL (ref 6.5–8.1)

## 2020-08-11 LAB — PROTIME-INR
INR: 4.2 (ref 0.8–1.2)
Prothrombin Time: 39.2 seconds — ABNORMAL HIGH (ref 11.4–15.2)

## 2020-08-11 MED ORDER — DOXYCYCLINE HYCLATE 100 MG PO TABS
100.0000 mg | ORAL_TABLET | Freq: Every day | ORAL | Status: AC
  Administered 2020-08-12: 100 mg via ORAL
  Filled 2020-08-11 (×2): qty 1

## 2020-08-11 MED ORDER — SODIUM CHLORIDE 0.9 % IV SOLN: INTRAVENOUS | Status: DC

## 2020-08-11 NOTE — Social Work (Signed)
CSW called pts grand daughter Sherre Poot at [phone], the number is a text now number and CSW unable to leave message. CSW texted number requesting a call back.   FL2 is complete, CSW has not faxed pt out. Pt is oriented X2.   Emeterio Reeve, Latanya Presser, [place] Social Worker [phone]

## 2020-08-11 NOTE — Plan of Care (Signed)
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
## 2020-08-12 ENCOUNTER — Inpatient Hospital Stay (HOSPITAL_COMMUNITY): Payer: Medicare Other

## 2020-08-12 ENCOUNTER — Other Ambulatory Visit: Payer: Self-pay

## 2020-08-12 DIAGNOSIS — C50919 Malignant neoplasm of unspecified site of unspecified female breast: Secondary | ICD-10-CM | POA: Diagnosis not present

## 2020-08-12 DIAGNOSIS — I482 Chronic atrial fibrillation, unspecified: Secondary | ICD-10-CM | POA: Diagnosis not present

## 2020-08-12 DIAGNOSIS — S2220XD Unspecified fracture of sternum, subsequent encounter for fracture with routine healing: Secondary | ICD-10-CM | POA: Diagnosis not present

## 2020-08-12 DIAGNOSIS — N179 Acute kidney failure, unspecified: Secondary | ICD-10-CM | POA: Diagnosis not present

## 2020-08-12 LAB — CBC WITH DIFFERENTIAL/PLATELET
Abs Immature Granulocytes: 0.1 10*3/uL — ABNORMAL HIGH (ref 0.00–0.07)
Basophils Absolute: 0.1 10*3/uL (ref 0.0–0.1)
Basophils Relative: 1 %
Eosinophils Absolute: 0.1 10*3/uL (ref 0.0–0.5)
Eosinophils Relative: 1 %
HCT: 31.5 % — ABNORMAL LOW (ref 36.0–46.0)
Hemoglobin: 9.8 g/dL — ABNORMAL LOW (ref 12.0–15.0)
Immature Granulocytes: 2 %
Lymphocytes Relative: 8 %
Lymphs Abs: 0.5 10*3/uL — ABNORMAL LOW (ref 0.7–4.0)
MCH: 30.7 pg (ref 26.0–34.0)
MCHC: 31.1 g/dL (ref 30.0–36.0)
MCV: 98.7 fL (ref 80.0–100.0)
Monocytes Absolute: 0.5 10*3/uL (ref 0.1–1.0)
Monocytes Relative: 8 %
Neutro Abs: 5.3 10*3/uL (ref 1.7–7.7)
Neutrophils Relative %: 80 %
Platelets: 159 10*3/uL (ref 150–400)
RBC: 3.19 MIL/uL — ABNORMAL LOW (ref 3.87–5.11)
RDW: 15.6 % — ABNORMAL HIGH (ref 11.5–15.5)
WBC: 6.6 10*3/uL (ref 4.0–10.5)
nRBC: 0 % (ref 0.0–0.2)

## 2020-08-12 LAB — COMPREHENSIVE METABOLIC PANEL
ALT: 16 U/L (ref 0–44)
AST: 20 U/L (ref 15–41)
Albumin: 2.8 g/dL — ABNORMAL LOW (ref 3.5–5.0)
Alkaline Phosphatase: 88 U/L (ref 38–126)
Anion gap: 11 (ref 5–15)
BUN: 53 mg/dL — ABNORMAL HIGH (ref 8–23)
CO2: 21 mmol/L — ABNORMAL LOW (ref 22–32)
Calcium: 9.3 mg/dL (ref 8.9–10.3)
Chloride: 107 mmol/L (ref 98–111)
Creatinine, Ser: 2.65 mg/dL — ABNORMAL HIGH (ref 0.44–1.00)
GFR, Estimated: 16 mL/min — ABNORMAL LOW (ref >60)
Glucose, Bld: 91 mg/dL (ref 70–99)
Potassium: 5 mmol/L (ref 3.5–5.1)
Sodium: 139 mmol/L (ref 135–145)
Total Bilirubin: 1.5 mg/dL — ABNORMAL HIGH (ref 0.3–1.2)
Total Protein: 6.8 g/dL (ref 6.5–8.1)

## 2020-08-12 LAB — PROTIME-INR
INR: 5.1 (ref 0.8–1.2)
Prothrombin Time: 45.4 seconds — ABNORMAL HIGH (ref 11.4–15.2)

## 2020-08-12 MED ORDER — SODIUM CHLORIDE 0.9 % IV SOLN: INTRAVENOUS | Status: DC

## 2020-08-12 NOTE — Progress Notes (Signed)
Appropriate Use Committee Chart Review  Chart reviewed by the physician advisor with input from [facility] and the attending MD as needed for review of the appropriateness for SNF referral.  TOC notes, PT/OT/ST notes, nursing notes and physician notes reviewed for medical necessity to determine if the patient's needs are appropriate for short-term rehab to return to a prior level of function versus the likely need for custodial care.  At this time, the patient meets Medicare criteria for SNF placement.   Recommendations: The patient is SNF appropriate for short-term rehab/skilled nursing interventions.   Jacquelynn Cree, MD Chief Physician Advisor  08/12/2020 12:43 PM
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
## 2020-08-12 NOTE — TOC Progression Note (Signed)
Transition of Care [facility]) - Progression Note    Patient Details  Name: Amy White MRN: [id number] Date of Birth: [birth year]  Transition of Care [facility]) CM/SW [place], [place] Phone Number: 08/12/2020, 12:36 PM  Clinical Narrative:    CSW spoke with pt's granddaughter, Amy White, who noted an update in her phone number [phone]). She is agreeable to SNF placement and has no preference accept for pt to remain in [place]. She noted that pt has not had any of her covid vaccines as she never leaves her home. Pt was currently living independently PTA. CSW will complete faxout and SW will follow with bed offers.         Expected Discharge Plan and Services                                                 Social Determinants of Health (SDOH) Interventions    Readmission Risk Interventions No flowsheet data found.

## 2020-08-12 NOTE — Plan of Care (Signed)
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
## 2020-08-12 NOTE — Plan of Care (Signed)
  Problem: Clinical Measurements: Goal: Respiratory complications will improve Outcome: Progressing   Problem: Activity: Goal: Risk for activity intolerance will decrease Outcome: Progressing   Problem: Coping: Goal: Level of anxiety will decrease Outcome: Progressing   Problem: Pain Managment: Goal: General experience of comfort will improve Outcome: Progressing   

## 2020-08-13 ENCOUNTER — Inpatient Hospital Stay (HOSPITAL_COMMUNITY): Payer: Medicare Other

## 2020-08-13 DIAGNOSIS — C50919 Malignant neoplasm of unspecified site of unspecified female breast: Secondary | ICD-10-CM | POA: Diagnosis not present

## 2020-08-13 DIAGNOSIS — Z515 Encounter for palliative care: Secondary | ICD-10-CM

## 2020-08-13 DIAGNOSIS — Z7189 Other specified counseling: Secondary | ICD-10-CM | POA: Diagnosis not present

## 2020-08-13 DIAGNOSIS — R5383 Other fatigue: Secondary | ICD-10-CM

## 2020-08-13 DIAGNOSIS — S2220XD Unspecified fracture of sternum, subsequent encounter for fracture with routine healing: Secondary | ICD-10-CM | POA: Diagnosis not present

## 2020-08-13 DIAGNOSIS — I482 Chronic atrial fibrillation, unspecified: Secondary | ICD-10-CM | POA: Diagnosis not present

## 2020-08-13 DIAGNOSIS — N179 Acute kidney failure, unspecified: Secondary | ICD-10-CM | POA: Diagnosis not present

## 2020-08-13 LAB — BASIC METABOLIC PANEL
Anion gap: 9 (ref 5–15)
BUN: 64 mg/dL — ABNORMAL HIGH (ref 8–23)
CO2: 19 mmol/L — ABNORMAL LOW (ref 22–32)
Calcium: 9.5 mg/dL (ref 8.9–10.3)
Chloride: 111 mmol/L (ref 98–111)
Creatinine, Ser: 2.69 mg/dL — ABNORMAL HIGH (ref 0.44–1.00)
GFR, Estimated: 16 mL/min — ABNORMAL LOW (ref >60)
Glucose, Bld: 99 mg/dL (ref 70–99)
Potassium: 5.1 mmol/L (ref 3.5–5.1)
Sodium: 139 mmol/L (ref 135–145)

## 2020-08-13 LAB — CBC WITH DIFFERENTIAL/PLATELET
Abs Immature Granulocytes: 0.12 10*3/uL — ABNORMAL HIGH (ref 0.00–0.07)
Basophils Absolute: 0.1 10*3/uL (ref 0.0–0.1)
Basophils Relative: 1 %
Eosinophils Absolute: 0.1 10*3/uL (ref 0.0–0.5)
Eosinophils Relative: 1 %
HCT: 32.3 % — ABNORMAL LOW (ref 36.0–46.0)
Hemoglobin: 9.8 g/dL — ABNORMAL LOW (ref 12.0–15.0)
Immature Granulocytes: 2 %
Lymphocytes Relative: 8 %
Lymphs Abs: 0.5 10*3/uL — ABNORMAL LOW (ref 0.7–4.0)
MCH: 30.5 pg (ref 26.0–34.0)
MCHC: 30.3 g/dL (ref 30.0–36.0)
MCV: 100.6 fL — ABNORMAL HIGH (ref 80.0–100.0)
Monocytes Absolute: 0.7 10*3/uL (ref 0.1–1.0)
Monocytes Relative: 11 %
Neutro Abs: 4.9 10*3/uL (ref 1.7–7.7)
Neutrophils Relative %: 77 %
Platelets: 158 10*3/uL (ref 150–400)
RBC: 3.21 MIL/uL — ABNORMAL LOW (ref 3.87–5.11)
RDW: 15.9 % — ABNORMAL HIGH (ref 11.5–15.5)
WBC: 6.4 10*3/uL (ref 4.0–10.5)
nRBC: 0.3 % — ABNORMAL HIGH (ref 0.0–0.2)

## 2020-08-13 LAB — PROTIME-INR
INR: 9.6 (ref 0.8–1.2)
Prothrombin Time: 75.2 s — ABNORMAL HIGH (ref 11.4–15.2)

## 2020-08-13 LAB — SARS CORONAVIRUS 2 (TAT 6-24 HRS): SARS Coronavirus 2: NEGATIVE

## 2020-08-13 LAB — GLUCOSE, CAPILLARY: Glucose-Capillary: 91 mg/dL (ref 70–99)

## 2020-08-13 MED ORDER — PHYTONADIONE 5 MG PO TABS
2.5000 mg | ORAL_TABLET | Freq: Once | ORAL | Status: AC
  Administered 2020-08-13: 2.5 mg via ORAL
  Filled 2020-08-13: qty 1
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
## 2020-08-13 NOTE — Plan of Care (Addendum)
RN was about to place foley for urinary retention but Pt had voided. MD notified that foley had not bee inserted.  Problem: Health Behavior/Discharge Planning: Goal: Ability to manage health-related needs will improve Outcome: Progressing   Problem: Clinical Measurements: Goal: Ability to maintain clinical measurements within normal limits will improve Outcome: Progressing   Problem: Activity: Goal: Risk for activity intolerance will decrease Outcome: Progressing   Problem: Nutrition: Goal: Adequate nutrition will be maintained Outcome: Progressing   Problem: Coping: Goal: Level of anxiety will decrease Outcome: Progressing   Problem: Elimination: Goal: Will not experience complications related to bowel motility Outcome: Progressing   Problem: Pain Managment: Goal: General experience of comfort will improve Outcome: Progressing   Problem: Safety: Goal: Ability to remain free from injury will improve Outcome: Progressing   Problem: Skin Integrity: Goal: Risk for impaired skin integrity will decrease Outcome: Progressing

## 2020-08-13 NOTE — Progress Notes (Signed)
7185 Was able to get the patient to take her dose of Vitamin K along with her morning meds crushed in pudding. Will continue to monitor.

## 2020-08-13 NOTE — Progress Notes (Signed)
ANTICOAGULATION CONSULT NOTE - Follow Up Consult  Labs: Recent Labs    08/11/20 0123 08/12/20 0333 08/13/20 0150  HGB 9.5* 9.8* 9.8*  HCT 30.6* 31.5* 32.3*  PLT 118* 159 158  LABPROT 39.2* 45.4* 75.2*  INR 4.2* 5.1* 9.6*  CREATININE 2.27* 2.65* 2.69*    Assessment: 87yo female on Coumadin for Afib w/ progressively elevating INR above goal after just 1mg  of Coumadin administered since admission date 3/3; INR now 9.6, Hgb stable without overt signs of bleeding.  Goal of Therapy:  INR 2-3   Plan:  Spoke w/ [facility] [place], give vitamin K 2.5mg  PO x1 and continue to monitor INR.  Wynona Neat, PharmD, BCPS  08/13/2020,4:06 AM

## 2020-08-13 NOTE — Plan of Care (Signed)
Patient is still only arousable to voice and pain, otherwise falls back asleep. Can get restless at times and was pulling at things the beginning of my shift. Put the mittens on her hands for safety, new IV placed by IV team, removed the continuous pulse ox monitor, and restarted patient's IVF due to poor PO intake. Unable to give Tylenol PO last night due to patient not being alert or awake enough to swallow the pills. Hospitalist ordered Vit K PO to help with patient's INR - will do my best to get her awake enough to swallow that pill. Discharge plan is SNF placement, no offers at this time. Will continue to monitor and continue current POC.
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
## 2020-08-13 NOTE — Care Management Important Message (Signed)
Important Message  Patient Details  Name: Amy White MRN: [id number] Date of Birth: Feb 11, 1926   Medicare Important Message Given:  Yes     [name] 08/13/2020, 2:51 PM

## 2020-08-14 ENCOUNTER — Inpatient Hospital Stay (HOSPITAL_COMMUNITY): Payer: Medicare Other

## 2020-08-14 DIAGNOSIS — R638 Other symptoms and signs concerning food and fluid intake: Secondary | ICD-10-CM

## 2020-08-14 DIAGNOSIS — R5383 Other fatigue: Secondary | ICD-10-CM | POA: Diagnosis not present

## 2020-08-14 DIAGNOSIS — Z515 Encounter for palliative care: Secondary | ICD-10-CM | POA: Diagnosis not present

## 2020-08-14 DIAGNOSIS — S2220XD Unspecified fracture of sternum, subsequent encounter for fracture with routine healing: Secondary | ICD-10-CM | POA: Diagnosis not present

## 2020-08-14 DIAGNOSIS — Z7189 Other specified counseling: Secondary | ICD-10-CM | POA: Diagnosis not present

## 2020-08-14 LAB — CBC WITH DIFFERENTIAL/PLATELET
Abs Immature Granulocytes: 0.27 10*3/uL — ABNORMAL HIGH (ref 0.00–0.07)
Basophils Absolute: 0.1 10*3/uL (ref 0.0–0.1)
Basophils Relative: 1 %
Eosinophils Absolute: 0.1 10*3/uL (ref 0.0–0.5)
Eosinophils Relative: 2 %
HCT: 31.2 % — ABNORMAL LOW (ref 36.0–46.0)
Hemoglobin: 10.4 g/dL — ABNORMAL LOW (ref 12.0–15.0)
Immature Granulocytes: 4 %
Lymphocytes Relative: 8 %
Lymphs Abs: 0.6 10*3/uL — ABNORMAL LOW (ref 0.7–4.0)
MCH: 32.4 pg (ref 26.0–34.0)
MCHC: 33.3 g/dL (ref 30.0–36.0)
MCV: 97.2 fL (ref 80.0–100.0)
Monocytes Absolute: 0.6 10*3/uL (ref 0.1–1.0)
Monocytes Relative: 10 %
Neutro Abs: 5 10*3/uL (ref 1.7–7.7)
Neutrophils Relative %: 75 %
Platelets: 180 10*3/uL (ref 150–400)
RBC: 3.21 MIL/uL — ABNORMAL LOW (ref 3.87–5.11)
RDW: 16.2 % — ABNORMAL HIGH (ref 11.5–15.5)
WBC: 6.6 10*3/uL (ref 4.0–10.5)
nRBC: 1.1 % — ABNORMAL HIGH (ref 0.0–0.2)

## 2020-08-14 LAB — CULTURE, BLOOD (ROUTINE X 2)
Culture: NO GROWTH
Culture: NO GROWTH
Special Requests: ADEQUATE
Special Requests: ADEQUATE

## 2020-08-14 LAB — PROTIME-INR
INR: 3 — ABNORMAL HIGH (ref 0.8–1.2)
Prothrombin Time: 29.8 seconds — ABNORMAL HIGH (ref 11.4–15.2)

## 2020-08-14 MED ORDER — SENNOSIDES-DOCUSATE SODIUM 8.6-50 MG PO TABS
1.0000 | ORAL_TABLET | Freq: Two times a day (BID) | ORAL | Status: DC
  Administered 2020-08-14 – 2020-08-15 (×2): 1 via ORAL
  Filled 2020-08-14 (×4): qty 1

## 2020-08-14 MED ORDER — BISACODYL 10 MG RE SUPP: 10.0000 mg | Freq: Every day | RECTAL | Status: DC | PRN

## 2020-08-14 MED ORDER — LACTATED RINGERS IV SOLN: INTRAVENOUS | Status: DC

## 2020-08-14 NOTE — Progress Notes (Signed)
   08/13/20 1927 08/13/20 2040 08/13/20 2135  Assess: MEWS Score  Temp 97.9 F (36.6 C)  --  (!) 97.5 F (36.4 C)  BP 123/76  --  128/62  Pulse Rate (!) 101  --  96  Resp 16  --  16  Level of Consciousness  --  Responds to Voice Responds to Voice  SpO2 95 %  --  97 %  O2 Device Nasal Cannula  --  Nasal Cannula  O2 Flow Rate (L/min)  --   --  2 L/min  Assess: MEWS Score  MEWS Temp 0 0 0  MEWS Systolic 0 0 0  MEWS Pulse 1 1 0  MEWS RR 0 0 0  MEWS LOC 1 1 1   MEWS Score 2 2 1   MEWS Score Color Yellow Yellow Green  Assess: if the MEWS score is Yellow or Red  Were vital signs taken at a resting state? Yes  --   --   Focused Assessment No change from prior assessment  --   --   Early Detection of Sepsis Score *See Row Information* Low  --   --   MEWS guidelines implemented *See Row Information* Yes  --   --   Treat  MEWS Interventions Other (Comment) (continuing to monitor patient per MEWS protocol)  --   --   Pain Scale  --  0-10  --   Pain Score Asleep Asleep  --   Escalate  MEWS: Escalate Yellow: discuss with charge nurse/RN and consider discussing with provider and RRT  --   --   Notify: Charge Nurse/RN  Name of Charge Nurse/RN Notified Joyce, RN  --   --   Date Charge Nurse/RN Notified 08/13/20  --   --   Time Charge Nurse/RN Notified 2000  --   --   Document  Patient Outcome Stabilized after interventions  --   --     08/13/20 2355  Assess: MEWS Score  Temp (!) 97.4 F (36.3 C)  BP (!) 110/91  Pulse Rate 87  Resp 16  Level of Consciousness  --   SpO2 97 %  O2 Device Nasal Cannula  O2 Flow Rate (L/min) 2 L/min  Assess: MEWS Score  MEWS Temp 0  MEWS Systolic 0  MEWS Pulse 0  MEWS RR 0  MEWS LOC 1  MEWS Score 1  MEWS Score Color Green  Assess: if the MEWS score is Yellow or Red  Were vital signs taken at a resting state?  --   Focused Assessment  --   Early Detection of Sepsis Score *See Row Information*  --   MEWS guidelines implemented *See Row Information*   --   Treat  MEWS Interventions  --   Pain Scale  --   Pain Score  --   Escalate  MEWS: Escalate  --   Notify: Charge Nurse/RN  Name of Charge Nurse/RN Notified  --   Date Charge Nurse/RN Notified  --   Time Charge Nurse/RN Notified  --   Document  Patient Outcome  --    MEWS protocol initiated. Will continue to monitor and intervene as appropriate.
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
## 2020-08-14 NOTE — Plan of Care (Signed)

## 2020-08-14 NOTE — Plan of Care (Signed)
No acute changes since the previous night that I took care of her. Patient is still very lethargic, not really alert, and will not eat or take medications. Yellow MEWS tonight, interventions implemented per protocol. VSS at this time and patient is on 2L [place]. NAD or needs voiced. Will continue to monitor and continue current POC.
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
## 2020-08-15 DIAGNOSIS — R5383 Other fatigue: Secondary | ICD-10-CM | POA: Diagnosis not present

## 2020-08-15 DIAGNOSIS — Z515 Encounter for palliative care: Secondary | ICD-10-CM | POA: Diagnosis not present

## 2020-08-15 DIAGNOSIS — Z7189 Other specified counseling: Secondary | ICD-10-CM | POA: Diagnosis not present

## 2020-08-15 LAB — BASIC METABOLIC PANEL
Anion gap: 8 (ref 5–15)
BUN: 89 mg/dL — ABNORMAL HIGH (ref 8–23)
CO2: 22 mmol/L (ref 22–32)
Calcium: 10.3 mg/dL (ref 8.9–10.3)
Chloride: 113 mmol/L — ABNORMAL HIGH (ref 98–111)
Creatinine, Ser: 2.97 mg/dL — ABNORMAL HIGH (ref 0.44–1.00)
GFR, Estimated: 14 mL/min — ABNORMAL LOW (ref >60)
Glucose, Bld: 99 mg/dL (ref 70–99)
Potassium: 5.5 mmol/L — ABNORMAL HIGH (ref 3.5–5.1)
Sodium: 143 mmol/L (ref 135–145)

## 2020-08-15 LAB — TSH: TSH: 4.721 u[IU]/mL — ABNORMAL HIGH (ref 0.350–4.500)

## 2020-08-15 LAB — PROTIME-INR
INR: 2.3 — ABNORMAL HIGH (ref 0.8–1.2)
Prothrombin Time: 24.7 seconds — ABNORMAL HIGH (ref 11.4–15.2)

## 2020-08-15 LAB — T4, FREE: Free T4: 0.89 ng/dL (ref 0.61–1.12)

## 2020-08-15 MED ORDER — WARFARIN SODIUM 2.5 MG PO TABS
2.5000 mg | ORAL_TABLET | Freq: Once | ORAL | Status: DC
  Filled 2020-08-15: qty 1

## 2020-08-15 MED ORDER — DEXTROSE-NACL 5-0.9 % IV SOLN: INTRAVENOUS | Status: DC

## 2020-08-15 MED ORDER — SODIUM ZIRCONIUM CYCLOSILICATE 5 G PO PACK
5.0000 g | PACK | Freq: Every day | ORAL | Status: DC
  Filled 2020-08-15 (×3): qty 1

## 2020-08-15 MED ORDER — DEXTROSE IN LACTATED RINGERS 5 % IV SOLN: INTRAVENOUS | Status: DC
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
## 2020-08-15 NOTE — TOC CAGE-AID Note (Signed)
Transition of Care [facility]) - CAGE-AID Screening   Patient Details  Name: Amy White MRN: [id number] Date of Birth: 10/16/1925   Dia Crawford, RN Phone Number: 08/15/2020, 9:53 AM   Clinical Narrative:  Unable to participate, GCS 13, memory impairment    CAGE-AID Screening: Substance Abuse Screening unable to be completed due to: : Patient unable to participate (GCS 13, Only oriented to person)
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
## 2020-08-15 NOTE — TOC Progression Note (Signed)
Transition of Care [facility]) - Progression Note    Patient Details  Name: Amy White MRN: [id number] Date of Birth: 05/23/1926  [facility]) CM/SW Contact  Wandra Feinstein [place], [place] Phone Number: 08/15/2020, 11:32 AM  Clinical Narrative:   TOC initially following for SNF placement; however, pt with decline and palliative is following for goals of care. Of note, pt reportedly unable to eat with feeding attempt. Pt does have two SNF offers [facility] and [facility]) but will need to have adequate diet in place. TOC will continue to follow and assist as indicated.   Wandra Feinstein, MSW, LCSW [phone] (coverage)            Expected Discharge Plan and Services                                                 Social Determinants of Health (SDOH) Interventions    Readmission Risk Interventions No flowsheet data found.

## 2020-08-16 ENCOUNTER — Inpatient Hospital Stay (HOSPITAL_COMMUNITY): Payer: Medicare Other

## 2020-08-16 DIAGNOSIS — R4182 Altered mental status, unspecified: Secondary | ICD-10-CM

## 2020-08-16 DIAGNOSIS — C50919 Malignant neoplasm of unspecified site of unspecified female breast: Secondary | ICD-10-CM | POA: Diagnosis not present

## 2020-08-16 DIAGNOSIS — S2220XD Unspecified fracture of sternum, subsequent encounter for fracture with routine healing: Secondary | ICD-10-CM | POA: Diagnosis not present

## 2020-08-16 DIAGNOSIS — S2222XA Fracture of body of sternum, initial encounter for closed fracture: Secondary | ICD-10-CM | POA: Diagnosis not present

## 2020-08-16 LAB — POTASSIUM
Potassium: 5.4 mmol/L — ABNORMAL HIGH (ref 3.5–5.1)
Potassium: 4.8 mmol/L (ref 3.5–5.1)

## 2020-08-16 LAB — CBC
HCT: 31.5 % — ABNORMAL LOW (ref 36.0–46.0)
Hemoglobin: 9.7 g/dL — ABNORMAL LOW (ref 12.0–15.0)
MCH: 31.2 pg (ref 26.0–34.0)
MCHC: 30.8 g/dL (ref 30.0–36.0)
MCV: 101.3 fL — ABNORMAL HIGH (ref 80.0–100.0)
Platelets: 157 10*3/uL (ref 150–400)
RBC: 3.11 MIL/uL — ABNORMAL LOW (ref 3.87–5.11)
RDW: 17.1 % — ABNORMAL HIGH (ref 11.5–15.5)
WBC: 6.7 10*3/uL (ref 4.0–10.5)
nRBC: 2.7 % — ABNORMAL HIGH (ref 0.0–0.2)

## 2020-08-16 LAB — BASIC METABOLIC PANEL
Anion gap: 7 (ref 5–15)
BUN: 90 mg/dL — ABNORMAL HIGH (ref 8–23)
CO2: 23 mmol/L (ref 22–32)
Calcium: 10.2 mg/dL (ref 8.9–10.3)
Chloride: 113 mmol/L — ABNORMAL HIGH (ref 98–111)
Creatinine, Ser: 3.05 mg/dL — ABNORMAL HIGH (ref 0.44–1.00)
GFR, Estimated: 14 mL/min — ABNORMAL LOW (ref >60)
Glucose, Bld: 154 mg/dL — ABNORMAL HIGH (ref 70–99)
Potassium: 5.8 mmol/L — ABNORMAL HIGH (ref 3.5–5.1)
Sodium: 143 mmol/L (ref 135–145)

## 2020-08-16 LAB — PROTIME-INR
INR: 2.2 — ABNORMAL HIGH (ref 0.8–1.2)
Prothrombin Time: 23.7 seconds — ABNORMAL HIGH (ref 11.4–15.2)

## 2020-08-16 LAB — MAGNESIUM: Magnesium: 2.6 mg/dL — ABNORMAL HIGH (ref 1.7–2.4)

## 2020-08-16 LAB — GLUCOSE, CAPILLARY: Glucose-Capillary: 147 mg/dL — ABNORMAL HIGH (ref 70–99)

## 2020-08-16 MED ORDER — FUROSEMIDE 10 MG/ML IJ SOLN
20.0000 mg | Freq: Once | INTRAMUSCULAR | Status: AC
  Administered 2020-08-16: 20 mg via INTRAVENOUS
  Filled 2020-08-16: qty 2

## 2020-08-16 MED ORDER — DEXTROSE 50 % IV SOLN
1.0000 | Freq: Once | INTRAVENOUS | Status: AC
  Administered 2020-08-16: 50 mL via INTRAVENOUS
  Filled 2020-08-16: qty 50

## 2020-08-16 MED ORDER — INSULIN ASPART 100 UNIT/ML IV SOLN
5.0000 [IU] | Freq: Once | INTRAVENOUS | Status: AC
  Administered 2020-08-16: 5 [IU] via INTRAVENOUS

## 2020-08-16 MED ORDER — CALCIUM GLUCONATE-NACL 1-0.675 GM/50ML-% IV SOLN
1.0000 g | Freq: Once | INTRAVENOUS | Status: AC
  Administered 2020-08-16: 1000 mg via INTRAVENOUS
  Filled 2020-08-16: qty 50

## 2020-08-16 MED ORDER — SODIUM CHLORIDE 0.9 % IV BOLUS
500.0000 mL | Freq: Once | INTRAVENOUS | Status: AC
  Administered 2020-08-16: 500 mL via INTRAVENOUS

## 2020-08-16 MED ORDER — WARFARIN SODIUM 2.5 MG PO TABS
2.5000 mg | ORAL_TABLET | Freq: Once | ORAL | Status: DC
  Filled 2020-08-16: qty 1

## 2020-08-16 NOTE — Plan of Care (Signed)
  Problem: Pain Managment: Goal: General experience of comfort will improve Outcome: Progressing   Problem: Activity: Goal: Risk for activity intolerance will decrease Outcome: Not Progressing   Problem: Nutrition: Goal: Adequate nutrition will be maintained Outcome: Not Progressing   

## 2020-08-16 NOTE — Progress Notes (Signed)
SLP Cancellation Note  Patient Details Name: Amy White MRN: [id number] DOB: 01/01/1926   Cancelled treatment:       Reason Eval/Treat Not Completed: Patient's level of consciousness   De[place], Katherene Ponto 08/16/2020, 10:28 AM

## 2020-08-16 NOTE — Progress Notes (Signed)
EEG completed, results pending. 
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
## 2020-08-17 DIAGNOSIS — Z515 Encounter for palliative care: Secondary | ICD-10-CM | POA: Diagnosis not present

## 2020-08-17 DIAGNOSIS — S2220XD Unspecified fracture of sternum, subsequent encounter for fracture with routine healing: Secondary | ICD-10-CM | POA: Diagnosis not present

## 2020-08-17 DIAGNOSIS — C50919 Malignant neoplasm of unspecified site of unspecified female breast: Secondary | ICD-10-CM | POA: Diagnosis not present

## 2020-08-17 LAB — BASIC METABOLIC PANEL
Anion gap: 4 — ABNORMAL LOW (ref 5–15)
BUN: 90 mg/dL — ABNORMAL HIGH (ref 8–23)
CO2: 22 mmol/L (ref 22–32)
Calcium: 10.3 mg/dL (ref 8.9–10.3)
Chloride: 118 mmol/L — ABNORMAL HIGH (ref 98–111)
Creatinine, Ser: 2.91 mg/dL — ABNORMAL HIGH (ref 0.44–1.00)
GFR, Estimated: 15 mL/min — ABNORMAL LOW (ref >60)
Glucose, Bld: 136 mg/dL — ABNORMAL HIGH (ref 70–99)
Potassium: 5 mmol/L (ref 3.5–5.1)
Sodium: 144 mmol/L (ref 135–145)

## 2020-08-17 LAB — CBC
HCT: 33.3 % — ABNORMAL LOW (ref 36.0–46.0)
Hemoglobin: 10.2 g/dL — ABNORMAL LOW (ref 12.0–15.0)
MCH: 31.3 pg (ref 26.0–34.0)
MCHC: 30.6 g/dL (ref 30.0–36.0)
MCV: 102.1 fL — ABNORMAL HIGH (ref 80.0–100.0)
Platelets: 137 10*3/uL — ABNORMAL LOW (ref 150–400)
RBC: 3.26 MIL/uL — ABNORMAL LOW (ref 3.87–5.11)
RDW: 17.5 % — ABNORMAL HIGH (ref 11.5–15.5)
WBC: 7.8 10*3/uL (ref 4.0–10.5)
nRBC: 3.4 % — ABNORMAL HIGH (ref 0.0–0.2)

## 2020-08-17 LAB — PROTIME-INR
INR: 2.6 — ABNORMAL HIGH (ref 0.8–1.2)
Prothrombin Time: 27.3 seconds — ABNORMAL HIGH (ref 11.4–15.2)

## 2020-08-17 LAB — MAGNESIUM: Magnesium: 2.5 mg/dL — ABNORMAL HIGH (ref 1.7–2.4)

## 2020-08-17 MED ORDER — LORAZEPAM 2 MG/ML IJ SOLN: 1.0000 mg | INTRAMUSCULAR | Status: DC | PRN

## 2020-08-17 MED ORDER — HALOPERIDOL LACTATE 5 MG/ML IJ SOLN: 2.0000 mg | INTRAMUSCULAR | Status: DC | PRN

## 2020-08-17 MED ORDER — GLYCOPYRROLATE 0.2 MG/ML IJ SOLN
0.2000 mg | INTRAMUSCULAR | Status: DC | PRN
  Filled 2020-08-17: qty 1

## 2020-08-17 MED ORDER — ACETAMINOPHEN 325 MG PO TABS: 650.0000 mg | ORAL_TABLET | Freq: Four times a day (QID) | ORAL | Status: DC | PRN

## 2020-08-17 MED ORDER — GLYCOPYRROLATE 1 MG PO TABS
1.0000 mg | ORAL_TABLET | ORAL | Status: DC | PRN
  Filled 2020-08-17: qty 1

## 2020-08-17 MED ORDER — FENTANYL CITRATE (PF) 100 MCG/2ML IJ SOLN: 12.5000 ug | INTRAMUSCULAR | Status: DC | PRN

## 2020-08-17 MED ORDER — HALOPERIDOL LACTATE 2 MG/ML PO CONC
2.0000 mg | ORAL | Status: DC | PRN
  Filled 2020-08-17: qty 1

## 2020-08-17 MED ORDER — ONDANSETRON 4 MG PO TBDP: 4.0000 mg | ORAL_TABLET | Freq: Four times a day (QID) | ORAL | Status: DC | PRN

## 2020-08-17 MED ORDER — ALBUTEROL SULFATE (2.5 MG/3ML) 0.083% IN NEBU: 2.5000 mg | INHALATION_SOLUTION | RESPIRATORY_TRACT | Status: DC | PRN

## 2020-08-17 MED ORDER — HALOPERIDOL 1 MG PO TABS
2.0000 mg | ORAL_TABLET | ORAL | Status: DC | PRN
  Filled 2020-08-17: qty 2

## 2020-08-17 MED ORDER — DIPHENHYDRAMINE HCL 50 MG/ML IJ SOLN: 12.5000 mg | INTRAMUSCULAR | Status: DC | PRN

## 2020-08-17 MED ORDER — LORAZEPAM 1 MG PO TABS: 1.0000 mg | ORAL_TABLET | ORAL | Status: DC | PRN

## 2020-08-17 MED ORDER — ACETAMINOPHEN 650 MG RE SUPP: 650.0000 mg | Freq: Four times a day (QID) | RECTAL | Status: DC | PRN

## 2020-08-17 MED ORDER — ONDANSETRON HCL 4 MG/2ML IJ SOLN: 4.0000 mg | Freq: Four times a day (QID) | INTRAMUSCULAR | Status: DC | PRN

## 2020-08-17 MED ORDER — LORAZEPAM 2 MG/ML PO CONC
1.0000 mg | ORAL | Status: DC | PRN
Start: 2020-08-17 — End: 2020-08-18

## 2020-08-17 MED ORDER — OXYCODONE HCL 20 MG/ML PO CONC: 5.0000 mg | ORAL | Status: DC | PRN

## 2020-08-17 MED ORDER — HYDROMORPHONE HCL 1 MG/ML IJ SOLN
0.5000 mg | INTRAMUSCULAR | Status: DC | PRN
Start: 2020-08-17 — End: 2020-08-18

## 2020-08-17 MED ORDER — OXYCODONE HCL 20 MG/ML PO CONC
5.0000 mg | ORAL | Status: DC | PRN
Start: 2020-08-17 — End: 2020-08-18
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
## 2020-08-17 NOTE — Progress Notes (Signed)
Held morning medications after multiple attempts to wake pt.

## 2020-08-18 DIAGNOSIS — S2220XD Unspecified fracture of sternum, subsequent encounter for fracture with routine healing: Secondary | ICD-10-CM | POA: Diagnosis not present

## 2020-08-18 MED ORDER — HYDROMORPHONE HCL 1 MG/ML IJ SOLN
0.5000 mg | Freq: Once | INTRAMUSCULAR | Status: AC
  Administered 2020-08-18: 0.5 mg via INTRAVENOUS
  Filled 2020-08-18: qty 0.5

## 2020-08-19 LAB — PATHOLOGIST SMEAR REVIEW

## 2020-08-28 ENCOUNTER — Ambulatory Visit: Payer: Medicare Other | Admitting: Family

## 2020-08-28 ENCOUNTER — Ambulatory Visit: Payer: Medicare Other

## 2020-08-28 ENCOUNTER — Other Ambulatory Visit: Payer: Medicare Other
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
## 2020-09-06 NOTE — Progress Notes (Signed)
Donor Services were notified of the patient's death.  The patient is not suitable for donor.  Bed placement [facility]) has been notified of patient's death.  Primary Care Nurse Rodena Piety, RN has contacted the family and the family reports that she will not be coming to view the patient while here in the hospital.  Dr Posey Pronto is present and aware of death of patient.

## 2020-09-06 NOTE — Progress Notes (Signed)
Time of death: 09/19/09

## 2020-09-06 DEATH — deceased

## 2021-01-20 ENCOUNTER — Other Ambulatory Visit: Payer: Self-pay | Admitting: Cardiovascular Disease
